# Patient Record
Sex: Female | Born: 1970 | Race: White | Hispanic: No | Marital: Married | Smoking: Never smoker
Health system: Southern US, Community
[De-identification: ages and names within clinical notes are randomized; demographics above are authoritative.]

## PROBLEM LIST (undated history)

## (undated) DIAGNOSIS — I10 Essential (primary) hypertension: Secondary | ICD-10-CM

## (undated) DIAGNOSIS — D649 Anemia, unspecified: Secondary | ICD-10-CM

## (undated) DIAGNOSIS — C801 Malignant (primary) neoplasm, unspecified: Secondary | ICD-10-CM

## (undated) DIAGNOSIS — R51 Headache: Secondary | ICD-10-CM

## (undated) DIAGNOSIS — Z9049 Acquired absence of other specified parts of digestive tract: Secondary | ICD-10-CM

## (undated) DIAGNOSIS — R0600 Dyspnea, unspecified: Secondary | ICD-10-CM

## (undated) DIAGNOSIS — G4730 Sleep apnea, unspecified: Secondary | ICD-10-CM

## (undated) DIAGNOSIS — G2581 Restless legs syndrome: Secondary | ICD-10-CM

## (undated) DIAGNOSIS — I509 Heart failure, unspecified: Secondary | ICD-10-CM

## (undated) DIAGNOSIS — M797 Fibromyalgia: Secondary | ICD-10-CM

## (undated) DIAGNOSIS — L309 Dermatitis, unspecified: Secondary | ICD-10-CM

## (undated) DIAGNOSIS — F329 Major depressive disorder, single episode, unspecified: Secondary | ICD-10-CM

## (undated) DIAGNOSIS — K8050 Calculus of bile duct without cholangitis or cholecystitis without obstruction: Secondary | ICD-10-CM

## (undated) DIAGNOSIS — K649 Unspecified hemorrhoids: Secondary | ICD-10-CM

## (undated) DIAGNOSIS — Z923 Personal history of irradiation: Secondary | ICD-10-CM

## (undated) DIAGNOSIS — F32A Depression, unspecified: Secondary | ICD-10-CM

## (undated) HISTORY — DX: Dermatitis, unspecified: L30.9

## (undated) HISTORY — DX: Major depressive disorder, single episode, unspecified: F32.9

## (undated) HISTORY — DX: Acquired absence of other specified parts of digestive tract: Z90.49

## (undated) HISTORY — DX: Calculus of bile duct without cholangitis or cholecystitis without obstruction: K80.50

## (undated) HISTORY — DX: Restless legs syndrome: G25.81

## (undated) HISTORY — PX: WISDOM TOOTH EXTRACTION: SHX21

## (undated) HISTORY — DX: Depression, unspecified: F32.A

## (undated) HISTORY — DX: Essential (primary) hypertension: I10

## (undated) HISTORY — DX: Dyspnea, unspecified: R06.00

## (undated) HISTORY — DX: Fibromyalgia: M79.7

---

## 1989-06-12 HISTORY — PX: DILATION AND CURETTAGE OF UTERUS: SHX78

## 1999-05-04 ENCOUNTER — Other Ambulatory Visit: Admission: RE | Admit: 1999-05-04 | Discharge: 1999-05-04 | Payer: Self-pay | Admitting: *Deleted

## 2000-05-09 ENCOUNTER — Other Ambulatory Visit: Admission: RE | Admit: 2000-05-09 | Discharge: 2000-05-09 | Payer: Self-pay | Admitting: *Deleted

## 2001-06-03 ENCOUNTER — Other Ambulatory Visit: Admission: RE | Admit: 2001-06-03 | Discharge: 2001-06-03 | Payer: Self-pay | Admitting: *Deleted

## 2002-06-10 ENCOUNTER — Other Ambulatory Visit: Admission: RE | Admit: 2002-06-10 | Discharge: 2002-06-10 | Payer: Self-pay | Admitting: *Deleted

## 2003-06-11 ENCOUNTER — Other Ambulatory Visit: Admission: RE | Admit: 2003-06-11 | Discharge: 2003-06-11 | Payer: Self-pay | Admitting: Obstetrics and Gynecology

## 2009-03-15 ENCOUNTER — Ambulatory Visit (HOSPITAL_COMMUNITY): Admission: RE | Admit: 2009-03-15 | Discharge: 2009-03-15 | Payer: Self-pay | Admitting: Family Medicine

## 2010-09-14 ENCOUNTER — Other Ambulatory Visit (HOSPITAL_COMMUNITY): Payer: Self-pay | Admitting: Family Medicine

## 2010-09-14 ENCOUNTER — Encounter (HOSPITAL_COMMUNITY): Payer: Self-pay

## 2010-09-14 ENCOUNTER — Ambulatory Visit (HOSPITAL_COMMUNITY)
Admission: RE | Admit: 2010-09-14 | Discharge: 2010-09-14 | Disposition: A | Discharge status: Home / Self Care | Payer: BC Managed Care – PPO | Source: Ambulatory Visit | Attending: Family Medicine | Admitting: Family Medicine

## 2010-09-14 DIAGNOSIS — R222 Localized swelling, mass and lump, trunk: Secondary | ICD-10-CM | POA: Insufficient documentation

## 2010-09-14 DIAGNOSIS — IMO0001 Reserved for inherently not codable concepts without codable children: Secondary | ICD-10-CM

## 2010-09-14 DIAGNOSIS — R0602 Shortness of breath: Secondary | ICD-10-CM | POA: Insufficient documentation

## 2010-09-14 DIAGNOSIS — R0789 Other chest pain: Secondary | ICD-10-CM | POA: Insufficient documentation

## 2012-02-22 ENCOUNTER — Other Ambulatory Visit (HOSPITAL_COMMUNITY): Payer: Self-pay | Admitting: Internal Medicine

## 2012-02-22 DIAGNOSIS — IMO0002 Reserved for concepts with insufficient information to code with codable children: Secondary | ICD-10-CM

## 2012-02-26 ENCOUNTER — Other Ambulatory Visit (HOSPITAL_COMMUNITY): Payer: Self-pay | Admitting: Internal Medicine

## 2012-02-26 ENCOUNTER — Ambulatory Visit (HOSPITAL_COMMUNITY)
Admission: RE | Admit: 2012-02-26 | Discharge: 2012-02-26 | Disposition: A | Discharge status: Home / Self Care | Payer: BC Managed Care – PPO | Source: Ambulatory Visit | Attending: Internal Medicine | Admitting: Internal Medicine

## 2012-02-26 DIAGNOSIS — R918 Other nonspecific abnormal finding of lung field: Secondary | ICD-10-CM | POA: Insufficient documentation

## 2012-02-26 DIAGNOSIS — IMO0002 Reserved for concepts with insufficient information to code with codable children: Secondary | ICD-10-CM

## 2012-02-26 DIAGNOSIS — J9859 Other diseases of mediastinum, not elsewhere classified: Secondary | ICD-10-CM | POA: Insufficient documentation

## 2012-02-26 DIAGNOSIS — R222 Localized swelling, mass and lump, trunk: Secondary | ICD-10-CM | POA: Insufficient documentation

## 2012-02-26 MED ORDER — IOHEXOL 300 MG/ML  SOLN
80.0000 mL | Freq: Once | INTRAMUSCULAR | Status: AC | PRN
  Administered 2012-02-26: 80 mL via INTRAVENOUS

## 2012-02-29 ENCOUNTER — Encounter: Payer: Self-pay | Admitting: *Deleted

## 2012-02-29 DIAGNOSIS — M797 Fibromyalgia: Secondary | ICD-10-CM

## 2012-02-29 DIAGNOSIS — R0600 Dyspnea, unspecified: Secondary | ICD-10-CM | POA: Insufficient documentation

## 2012-02-29 DIAGNOSIS — F329 Major depressive disorder, single episode, unspecified: Secondary | ICD-10-CM | POA: Insufficient documentation

## 2012-02-29 DIAGNOSIS — G2581 Restless legs syndrome: Secondary | ICD-10-CM | POA: Insufficient documentation

## 2012-02-29 DIAGNOSIS — F32A Depression, unspecified: Secondary | ICD-10-CM | POA: Insufficient documentation

## 2012-02-29 DIAGNOSIS — J9859 Other diseases of mediastinum, not elsewhere classified: Secondary | ICD-10-CM

## 2012-02-29 DIAGNOSIS — L309 Dermatitis, unspecified: Secondary | ICD-10-CM | POA: Insufficient documentation

## 2012-02-29 DIAGNOSIS — I10 Essential (primary) hypertension: Secondary | ICD-10-CM | POA: Insufficient documentation

## 2012-03-01 ENCOUNTER — Other Ambulatory Visit: Payer: Self-pay | Admitting: Thoracic Surgery (Cardiothoracic Vascular Surgery)

## 2012-03-01 ENCOUNTER — Encounter: Payer: Self-pay | Admitting: Thoracic Surgery (Cardiothoracic Vascular Surgery)

## 2012-03-01 ENCOUNTER — Other Ambulatory Visit: Payer: Self-pay | Admitting: *Deleted

## 2012-03-01 ENCOUNTER — Institutional Professional Consult (permissible substitution) (INDEPENDENT_AMBULATORY_CARE_PROVIDER_SITE_OTHER): Payer: BC Managed Care – PPO | Admitting: Thoracic Surgery (Cardiothoracic Vascular Surgery)

## 2012-03-01 VITALS — BP 143/90 | HR 99 | Resp 16 | Ht 66.0 in | Wt 195.0 lb

## 2012-03-01 DIAGNOSIS — J9859 Other diseases of mediastinum, not elsewhere classified: Secondary | ICD-10-CM

## 2012-03-01 DIAGNOSIS — D381 Neoplasm of uncertain behavior of trachea, bronchus and lung: Secondary | ICD-10-CM

## 2012-03-01 DIAGNOSIS — R222 Localized swelling, mass and lump, trunk: Secondary | ICD-10-CM

## 2012-03-01 LAB — HCG, QUANTITATIVE, PREGNANCY: hCG, Beta Chain, Quant, S: <2 m[IU]/mL

## 2012-03-01 LAB — AFP TUMOR MARKER: AFP-Tumor Marker: 4.3 ng/mL (ref 0.0–8.0)

## 2012-03-01 NOTE — Progress Notes (Signed)
PCP is HALL,ZACK, MD Referring Provider is Hall, Zack, MD  Chief Complaint  Patient presents with  . Mediastinal Mass    anterior...eval and treat    HPI: [name] [name] is a 41-year-old woman who presents with a chief complaint of chest discomfort and shortness of breath with exertion.  She says her symptoms date back about 2-1/2-3 years. She previously was very active. She began having episodes when she would exert herself that caused her to feel like her heart was "beating out of her chest" she. She also would have a feeling that she couldn't get a full inspiration and that he felt like "there was air at the top of the lungs but none at the bottom". This has progressively gotten worse over time. She had been seen on several occasions, but no underlying cause was discerned. She has had to stop many activities that she formally engaged in such as hiking and tennis. She's not had any fevers or chills but does have occasional night sweats.  She recently saw Dr. Hall. In reviewing her records she found a chest x-ray from April of 2012 which showed an abnormal mediastinal contour. He ordered a CT of the chest which showed a large anterior mediastinal mass surrounding and encasing the great vessels.  Past Medical History  Diagnosis Date  . Fibromyalgia   . Eczema   . Hypertension   . Dyspnea   . Depression   . Restless leg     No past surgical history on file.  No family history on file.  Social History History  Substance Use Topics  . Smoking status: Never Smoker   . Smokeless tobacco: Never Used  . Alcohol Use: No    Current Outpatient Prescriptions  Medication Sig Dispense Refill  . desvenlafaxine (PRISTIQ) 50 MG 24 hr tablet Take 50 mg by mouth daily.      . gabapentin (NEURONTIN) 600 MG tablet Take 300 mg by mouth at bedtime. One or two hs      . lisinopril-hydrochlorothiazide (PRINZIDE,ZESTORETIC) 20-12.5 MG per tablet Take 1 tablet by mouth daily.      . Multiple  Minerals-Vitamins (CALCIUM & VIT D3 BONE HEALTH PO) Take by mouth.      . norethindrone-ethinyl estradiol (JUNEL FE,GILDESS FE,LOESTRIN FE) 1-20 MG-MCG tablet Take 1 tablet by mouth daily.      . tiZANidine (ZANAFLEX) 4 MG capsule Take 4 mg by mouth at bedtime.      . vitamin B-12 (CYANOCOBALAMIN) 1000 MCG tablet Take 1,000 mcg by mouth daily.      . vitamin C (ASCORBIC ACID) 500 MG tablet Take 500 mg by mouth daily.        Allergies  Allergen Reactions  . Sulfur Anaphylaxis    Review of Systems  Constitutional: Positive for activity change and fatigue. Negative for fever, chills and unexpected weight change.  Respiratory: Positive for chest tightness and shortness of breath.   Cardiovascular: Positive for chest pain.  Genitourinary: Negative.   Musculoskeletal:       Muscle pain, restless leg syndrome  Neurological: Negative for dizziness, seizures and light-headedness.  Hematological: Does not bruise/bleed easily.  Psychiatric/Behavioral: Negative.   All other systems reviewed and are negative.    BP 143/90  Pulse 99  Resp 16  Ht 5' 6" (1.676 m)  Wt 195 lb (88.451 kg)  BMI 31.47 kg/m2  SpO2 96% Physical Exam  Vitals reviewed. Constitutional: She is oriented to person, place, and time. She appears well-developed and well-nourished. No distress.    HENT:  Head: Normocephalic and atraumatic.  Eyes: EOM are normal. Pupils are equal, round, and reactive to light.  Neck: Neck supple. No thyromegaly present.  Cardiovascular: Normal rate and regular rhythm.   Murmur (2/6 systolic) heard. Pulmonary/Chest: Effort normal and breath sounds normal. She has no wheezes. She has no rales.  Abdominal: Soft. There is no tenderness.  Musculoskeletal: She exhibits no edema.  Lymphadenopathy:    She has cervical adenopathy (questionable lower right cervical node).  Neurological: She is alert and oriented to person, place, and time. No cranial nerve deficit.  Skin: Skin is warm and dry.      Diagnostic Tests: CT Chest 02/26/12 Technique: Multidetector CT imaging of the chest was performed  following the standard protocol during bolus administration of  intravenous contrast.  Contrast: 80mL OMNIPAQUE IOHEXOL 300 MG/ML SOLN  Comparison: None.  Findings:  There is no enlarged supraclavicular or axillary adenopathy. Large  anterior mediastinal mass is identified measuring 8.30 x 12.1 x 6.7  cm. This mass is encasing the great vessels. There is significant  narrowing of the left subclavian vein. There is also encasement and  narrowing of the left common carotid artery. There is no hilar  adenopathy. No pericardial or pleural effusion.  Lungs are clear. No pulmonary nodule or pulmonary mass identified.  Limited imaging through the upper abdomen is unremarkable.  IMPRESSION:  1. Large anterior mediastinal mass is identified and is worrisome  for tumor. This is encasing the great vessels and S causing  significant narrowing of the left subclavian vein. Recommend  further evaluation with PET CT and tissue sampling.   Impression: 41-year-old woman with newly discovered anterior mediastinal mass. I went to great detail with patient and her husband regarding the CT findings and reviewed the films with them. We talked about the differential diagnosis. They understand that different diagnosis includes lymphoma, thymic tumors, germ cell tumors and thyroid. They understand this lesion is more likely to be malignant than benign. Given the appearance of her lesion I think lymphoma is the most likely diagnosis. Malignant germ cell tumors are much less common in women, but not unheard of..  We need to establish a definitive diagnosis. I will order germ cell tumor markers. We will go ahead and schedule her for a left anterior mediastinotomy (Chamberlain procedure) pending the results of the blood tests. She will also need a PET/CT but I do not want to delay the biopsy.  I discussed the  indications, risks, benefits, and alternatives for a left anterior mediastinotomy. They understand this is far more likely to yield a definitive diagnosis than attempted needle aspiration. They understand the general nature of the procedure, the incision to be used, and the plan for general anesthesia. They also understand that this is a diagnostic and not a therapeutic procedure. They understand the risks include but are not limited to those associated with general anesthesia, bleeding, infection, as well as the extremely unlikely possibilities of MI, DVT, PE. She accepts these risks and wishes to proceed.   Plan: 1. Alpha-fetoprotein and beta hCG levels 2. Schedule PET/CT 3. Left anterior mediastinotomy under general anesthesia on Tuesday, September 24. We will plan to do this as an outpatient procedure. 

## 2012-03-04 ENCOUNTER — Encounter (HOSPITAL_COMMUNITY): Payer: Self-pay | Admitting: Pharmacy Technician

## 2012-03-04 ENCOUNTER — Encounter (HOSPITAL_COMMUNITY)
Admission: RE | Admit: 2012-03-04 | Discharge: 2012-03-04 | Disposition: A | Discharge status: Home / Self Care | Payer: BC Managed Care – PPO | Source: Ambulatory Visit | Attending: Thoracic Surgery (Cardiothoracic Vascular Surgery) | Admitting: Thoracic Surgery (Cardiothoracic Vascular Surgery)

## 2012-03-04 ENCOUNTER — Encounter (HOSPITAL_COMMUNITY): Payer: Self-pay

## 2012-03-04 VITALS — BP 162/97 | HR 109 | Temp 98.8°F | Resp 20 | Ht 66.0 in | Wt 199.3 lb

## 2012-03-04 DIAGNOSIS — J9859 Other diseases of mediastinum, not elsewhere classified: Secondary | ICD-10-CM

## 2012-03-04 HISTORY — DX: Unspecified hemorrhoids: K64.9

## 2012-03-04 HISTORY — DX: Malignant (primary) neoplasm, unspecified: C80.1

## 2012-03-04 HISTORY — DX: Sleep apnea, unspecified: G47.30

## 2012-03-04 HISTORY — DX: Headache: R51

## 2012-03-04 HISTORY — DX: Anemia, unspecified: D64.9

## 2012-03-04 LAB — COMPREHENSIVE METABOLIC PANEL
ALT: 31 U/L (ref 0–35)
AST: 23 U/L (ref 0–37)
Albumin: 3.7 g/dL (ref 3.5–5.2)
Alkaline Phosphatase: 97 U/L (ref 39–117)
BUN: 16 mg/dL (ref 6–23)
CO2: 22 mEq/L (ref 19–32)
Calcium: 9.7 mg/dL (ref 8.4–10.5)
Chloride: 102 mEq/L (ref 96–112)
Creatinine, Ser: 0.77 mg/dL (ref 0.50–1.10)
GFR calc Af Amer: >90 mL/min (ref >90)
GFR calc non Af Amer: >90 mL/min (ref >90)
Glucose, Bld: 130 mg/dL — ABNORMAL HIGH (ref 70–99)
Potassium: 3.5 mEq/L (ref 3.5–5.1)
Sodium: 138 mEq/L (ref 135–145)
Total Bilirubin: 0.2 mg/dL — ABNORMAL LOW (ref 0.3–1.2)
Total Protein: 7.4 g/dL (ref 6.0–8.3)

## 2012-03-04 LAB — CBC
HCT: 38 % (ref 36.0–46.0)
Hemoglobin: 13.4 g/dL (ref 12.0–15.0)
MCH: 30.7 pg (ref 26.0–34.0)
MCHC: 35.3 g/dL (ref 30.0–36.0)
MCV: 87.2 fL (ref 78.0–100.0)
Platelets: 321 10*3/uL (ref 150–400)
RBC: 4.36 MIL/uL (ref 3.87–5.11)
RDW: 12.3 % (ref 11.5–15.5)
WBC: 8.7 10*3/uL (ref 4.0–10.5)

## 2012-03-04 LAB — URINALYSIS, ROUTINE W REFLEX MICROSCOPIC
Bilirubin Urine: NEGATIVE
Glucose, UA: NEGATIVE mg/dL
Ketones, ur: NEGATIVE mg/dL
Nitrite: NEGATIVE
Protein, ur: NEGATIVE mg/dL
Specific Gravity, Urine: 1.027 (ref 1.005–1.030)
Urobilinogen, UA: 1 mg/dL (ref 0.0–1.0)
pH: 6 (ref 5.0–8.0)

## 2012-03-04 LAB — BLOOD GAS, ARTERIAL
Acid-base deficit: 0.8 mmol/L (ref 0.0–2.0)
Bicarbonate: 22.7 mEq/L (ref 20.0–24.0)
Drawn by: 206361
FIO2: 0.21 %
O2 Saturation: 98.1 %
Patient temperature: 98.6
TCO2: 23.7 mmol/L (ref 0–100)
pCO2 arterial: 33.4 mmHg — ABNORMAL LOW (ref 35.0–45.0)
pH, Arterial: 7.446 (ref 7.350–7.450)
pO2, Arterial: 101 mmHg — ABNORMAL HIGH (ref 80.0–100.0)

## 2012-03-04 LAB — PROTIME-INR
INR: 1.08 (ref 0.00–1.49)
Prothrombin Time: 13.9 seconds (ref 11.6–15.2)

## 2012-03-04 LAB — URINE MICROSCOPIC-ADD ON

## 2012-03-04 LAB — APTT: aPTT: 29 seconds (ref 24–37)

## 2012-03-04 LAB — SURGICAL PCR SCREEN
MRSA, PCR: NEGATIVE
Staphylococcus aureus: NEGATIVE

## 2012-03-04 LAB — ABO/RH: ABO/RH(D): A POS

## 2012-03-04 MED ORDER — DEXTROSE 5 % IV SOLN
1.5000 g | INTRAVENOUS | Status: AC
  Administered 2012-03-05: 1.5 g via INTRAVENOUS
  Filled 2012-03-04: qty 1.5

## 2012-03-04 NOTE — Progress Notes (Signed)
I faxed a request to [facility] for an EKG.

## 2012-03-04 NOTE — Pre-Procedure Instructions (Signed)
20 IFUNANYA FIERO  03/04/2012   Your procedure is scheduled on:  Tuesday, September 24th.  Report to [facility] at 5:30AM.   Call this number if you have problems the morning of surgery: [phone]   Remember:   Do not eat food or drink any liquid:After Midnight.     Take these medicines the morning of surgery with A SIP OF WATER: Desvenlafaxine (Pristiq) and Hormones.   Stop taking Ibuprofen (Advil).   Do not wear jewelry, make-up or nail polish.  Do not wear lotions, powders, or perfumes. You may wear deodorant.  Do not shave 48 hours prior to surgery. Men may shave face and neck.  Do not bring valuables to the hospital.  Contacts, dentures or bridgework may not be worn into surgery.  Leave suitcase in the car. After surgery it may be brought to your room.  For patients admitted to the hospital, checkout time is 11:00 AM the day of discharge.   Patients discharged the day of surgery will not be allowed to drive home.  Name and phone number of your driver: NA  Special Instructions: Shower using CHG 2 nights before surgery and the night before surgery.  If you shower the day of surgery use CHG.  Use special wash - you have one bottle of CHG for all showers.  You should use approximately 1/3 of the bottle for each shower.   Please read over the following fact sheets that you were given: Pain Booklet, Coughing and Deep Breathing, Blood Transfusion Information and MRSA Information.

## 2012-03-05 ENCOUNTER — Ambulatory Visit (HOSPITAL_COMMUNITY): Payer: BC Managed Care – PPO

## 2012-03-05 ENCOUNTER — Ambulatory Visit (HOSPITAL_COMMUNITY)
Admission: RE | Admit: 2012-03-05 | Discharge: 2012-03-05 | Disposition: A | Discharge status: Home / Self Care | Payer: BC Managed Care – PPO | Source: Ambulatory Visit | Attending: Thoracic Surgery (Cardiothoracic Vascular Surgery) | Admitting: Thoracic Surgery (Cardiothoracic Vascular Surgery)

## 2012-03-05 ENCOUNTER — Encounter (HOSPITAL_COMMUNITY): Payer: Self-pay | Admitting: Anesthesiology

## 2012-03-05 ENCOUNTER — Encounter (HOSPITAL_COMMUNITY)
Admission: RE | Disposition: A | Discharge status: Home / Self Care | Payer: Self-pay | Source: Ambulatory Visit | Attending: Thoracic Surgery (Cardiothoracic Vascular Surgery)

## 2012-03-05 ENCOUNTER — Ambulatory Visit (HOSPITAL_COMMUNITY): Payer: BC Managed Care – PPO | Admitting: Anesthesiology

## 2012-03-05 ENCOUNTER — Encounter (HOSPITAL_COMMUNITY): Payer: Self-pay | Admitting: Surgery

## 2012-03-05 DIAGNOSIS — C8192 Hodgkin lymphoma, unspecified, intrathoracic lymph nodes: Secondary | ICD-10-CM | POA: Insufficient documentation

## 2012-03-05 DIAGNOSIS — Z01812 Encounter for preprocedural laboratory examination: Secondary | ICD-10-CM | POA: Insufficient documentation

## 2012-03-05 DIAGNOSIS — R222 Localized swelling, mass and lump, trunk: Secondary | ICD-10-CM

## 2012-03-05 DIAGNOSIS — J9859 Other diseases of mediastinum, not elsewhere classified: Secondary | ICD-10-CM

## 2012-03-05 DIAGNOSIS — Z01810 Encounter for preprocedural cardiovascular examination: Secondary | ICD-10-CM | POA: Insufficient documentation

## 2012-03-05 DIAGNOSIS — I10 Essential (primary) hypertension: Secondary | ICD-10-CM | POA: Insufficient documentation

## 2012-03-05 DIAGNOSIS — Z01818 Encounter for other preprocedural examination: Secondary | ICD-10-CM | POA: Insufficient documentation

## 2012-03-05 HISTORY — PX: OTHER SURGICAL HISTORY: SHX169

## 2012-03-05 SURGERY — MEDIASTINOTOMY, CHAMBERLAIN
Anesthesia: General | Site: Chest | Laterality: Left | Wound class: Clean

## 2012-03-05 MED ORDER — PROPOFOL 10 MG/ML IV BOLUS
INTRAVENOUS | Status: DC | PRN
  Administered 2012-03-05: 140 mg via INTRAVENOUS

## 2012-03-05 MED ORDER — GLYCOPYRROLATE 0.2 MG/ML IJ SOLN
INTRAMUSCULAR | Status: DC | PRN
  Administered 2012-03-05: .8 mg via INTRAVENOUS

## 2012-03-05 MED ORDER — FENTANYL CITRATE 0.05 MG/ML IJ SOLN
INTRAMUSCULAR | Status: DC | PRN
  Administered 2012-03-05: 25 ug via INTRAVENOUS
  Administered 2012-03-05: 175 ug via INTRAVENOUS
  Administered 2012-03-05 (×2): 25 ug via INTRAVENOUS
  Administered 2012-03-05: 50 ug via INTRAVENOUS
  Administered 2012-03-05 (×2): 25 ug via INTRAVENOUS

## 2012-03-05 MED ORDER — OXYCODONE-ACETAMINOPHEN 5-325 MG PO TABS: 1.0000 | ORAL_TABLET | ORAL | Status: DC | PRN

## 2012-03-05 MED ORDER — MIDAZOLAM HCL 5 MG/5ML IJ SOLN
INTRAMUSCULAR | Status: DC | PRN
  Administered 2012-03-05: 2 mg via INTRAVENOUS

## 2012-03-05 MED ORDER — DEXAMETHASONE SODIUM PHOSPHATE 4 MG/ML IJ SOLN
INTRAMUSCULAR | Status: DC | PRN
  Administered 2012-03-05: 4 mg via INTRAVENOUS

## 2012-03-05 MED ORDER — NEOSTIGMINE METHYLSULFATE 1 MG/ML IJ SOLN
INTRAMUSCULAR | Status: DC | PRN
  Administered 2012-03-05: 5 mg via INTRAVENOUS

## 2012-03-05 MED ORDER — BUPIVACAINE-EPINEPHRINE (PF) 0.5% -1:200000 IJ SOLN
INTRAMUSCULAR | Status: AC
  Filled 2012-03-05: qty 10

## 2012-03-05 MED ORDER — ONDANSETRON HCL 4 MG/2ML IJ SOLN
INTRAMUSCULAR | Status: DC | PRN
  Administered 2012-03-05: 4 mg via INTRAVENOUS

## 2012-03-05 MED ORDER — LIDOCAINE HCL (CARDIAC) 20 MG/ML IV SOLN
INTRAVENOUS | Status: DC | PRN
  Administered 2012-03-05: 70 mg via INTRAVENOUS

## 2012-03-05 MED ORDER — ONDANSETRON HCL 4 MG/2ML IJ SOLN: 4.0000 mg | Freq: Once | INTRAMUSCULAR | Status: DC | PRN

## 2012-03-05 MED ORDER — HYDROMORPHONE HCL PF 1 MG/ML IJ SOLN
0.2500 mg | INTRAMUSCULAR | Status: DC | PRN
  Administered 2012-03-05 (×2): 0.5 mg via INTRAVENOUS

## 2012-03-05 MED ORDER — HYDROMORPHONE HCL PF 1 MG/ML IJ SOLN
INTRAMUSCULAR | Status: AC
  Filled 2012-03-05: qty 1

## 2012-03-05 MED ORDER — ACETAMINOPHEN 10 MG/ML IV SOLN: 1000.0000 mg | Freq: Once | INTRAVENOUS | Status: DC | PRN

## 2012-03-05 MED ORDER — 0.9 % SODIUM CHLORIDE (POUR BTL) OPTIME
TOPICAL | Status: DC | PRN
  Administered 2012-03-05: 1000 mL

## 2012-03-05 MED ORDER — BUPIVACAINE-EPINEPHRINE PF 0.5-1:200000 % IJ SOLN
INTRAMUSCULAR | Status: DC | PRN
  Administered 2012-03-05: 18 mL

## 2012-03-05 MED ORDER — ROCURONIUM BROMIDE 100 MG/10ML IV SOLN
INTRAVENOUS | Status: DC | PRN
  Administered 2012-03-05: 50 mg via INTRAVENOUS

## 2012-03-05 MED ORDER — LACTATED RINGERS IV SOLN
INTRAVENOUS | Status: DC | PRN
  Administered 2012-03-05 (×2): via INTRAVENOUS

## 2012-03-05 SURGICAL SUPPLY — 51 items
ADH SKN CLS LQ APL DERMABOND (GAUZE/BANDAGES/DRESSINGS) ×1
APPLIER CLIP LOGIC TI 5 (MISCELLANEOUS) IMPLANT
APR CLP MED LRG 33X5 (MISCELLANEOUS)
BLADE SURG 15 STRL LF DISP TIS (BLADE) ×1 IMPLANT
BLADE SURG 15 STRL SS (BLADE)
CANISTER SUCTION 2500CC (MISCELLANEOUS) ×2 IMPLANT
CLIP TI MEDIUM 6 (CLIP) ×2 IMPLANT
CLOTH BEACON ORANGE TIMEOUT ST (SAFETY) ×2 IMPLANT
CONT SPEC 4OZ CLIKSEAL STRL BL (MISCELLANEOUS) ×4 IMPLANT
COVER SURGICAL LIGHT HANDLE (MISCELLANEOUS) ×3 IMPLANT
DERMABOND ADHESIVE PROPEN (GAUZE/BANDAGES/DRESSINGS) ×1
DERMABOND ADVANCED .7 DNX6 (GAUZE/BANDAGES/DRESSINGS) IMPLANT
DRAPE LAPAROTOMY T 102X78X121 (DRAPES) ×2 IMPLANT
ELECT REM PT RETURN 9FT ADLT (ELECTROSURGICAL) ×2
ELECTRODE REM PT RTRN 9FT ADLT (ELECTROSURGICAL) ×1 IMPLANT
GAUZE SPONGE 4X4 16PLY XRAY LF (GAUZE/BANDAGES/DRESSINGS) ×2 IMPLANT
GLOVE EUDERMIC 7 POWDERFREE (GLOVE) ×2 IMPLANT
GOWN PREVENTION PLUS XLARGE (GOWN DISPOSABLE) ×2 IMPLANT
GOWN STRL NON-REIN LRG LVL3 (GOWN DISPOSABLE) ×2 IMPLANT
GOWN STRL REIN XL XLG (GOWN DISPOSABLE) ×1 IMPLANT
HEMOSTAT SURGICEL 2X14 (HEMOSTASIS) ×1 IMPLANT
KIT BASIN OR (CUSTOM PROCEDURE TRAY) ×2 IMPLANT
KIT ROOM TURNOVER OR (KITS) ×2 IMPLANT
NEEDLE HYPO 22GX1.5 SAFETY (NEEDLE) ×1 IMPLANT
NS IRRIG 1000ML POUR BTL (IV SOLUTION) ×2 IMPLANT
PACK GENERAL/GYN (CUSTOM PROCEDURE TRAY) ×1 IMPLANT
PACK SURGICAL SETUP 50X90 (CUSTOM PROCEDURE TRAY) ×1 IMPLANT
PAD ARMBOARD 7.5X6 YLW CONV (MISCELLANEOUS) ×4 IMPLANT
PENCIL BUTTON HOLSTER BLD 10FT (ELECTRODE) ×1 IMPLANT
SPONGE GAUZE 4X4 12PLY (GAUZE/BANDAGES/DRESSINGS) ×2 IMPLANT
SPONGE INTESTINAL PEANUT (DISPOSABLE) ×1 IMPLANT
SUT PROLENE 4 0 RB 1 (SUTURE) ×4
SUT PROLENE 4-0 RB1 .5 CRCL 36 (SUTURE) IMPLANT
SUT SILK 2 0 TIES 10X30 (SUTURE) IMPLANT
SUT SILK 2 0SH CR/8 30 (SUTURE) ×1 IMPLANT
SUT SILK 3 0 SH CR/8 (SUTURE) ×1 IMPLANT
SUT VIC AB 2-0 CT1 27 (SUTURE) ×2
SUT VIC AB 2-0 CT1 TAPERPNT 27 (SUTURE) ×1 IMPLANT
SUT VIC AB 3-0 SH 18 (SUTURE) IMPLANT
SUT VIC AB 3-0 SH 27 (SUTURE) ×2
SUT VIC AB 3-0 SH 27X BRD (SUTURE) ×1 IMPLANT
SUT VICRYL 4-0 PS2 18IN ABS (SUTURE) IMPLANT
SWAB COLLECTION DEVICE MRSA (MISCELLANEOUS) IMPLANT
SYR CONTROL 10ML LL (SYRINGE) ×1 IMPLANT
SYRINGE 10CC LL (SYRINGE) ×2 IMPLANT
TOWEL OR 17X24 6PK STRL BLUE (TOWEL DISPOSABLE) ×2 IMPLANT
TOWEL OR 17X26 10 PK STRL BLUE (TOWEL DISPOSABLE) ×2 IMPLANT
TRAY FOLEY IC TEMP SENS 14FR (CATHETERS) ×2 IMPLANT
TUBE ANAEROBIC SPECIMEN COL (MISCELLANEOUS) IMPLANT
TUBE CONNECTING 12X1/4 (SUCTIONS) ×1 IMPLANT
WATER STERILE IRR 1000ML POUR (IV SOLUTION) ×2 IMPLANT

## 2012-03-05 NOTE — Brief Op Note (Addendum)
03/05/2012  9:13 AM  PATIENT:  [name] [name]  41 y.o. female  PRE-OPERATIVE DIAGNOSIS:  ANTERIOR MEDIASTINAL MASS  POST-OPERATIVE DIAGNOSIS:  ANTERIOR MEDIASTINAL MASS  PROCEDURE: Left Anterior MEDIASTINOTOMY (CHAMBERLAIN)   SURGEON:  Surgeon(s) and Role:    * Loreli Slot, MD - Primary  PHYSICIAN ASSISTANT: Doree Fudge PA-C   ANESTHESIA:   general  EBL:  Total I/O In: 1000 [I.V.:1000] Out: 100 [Urine:50; Blood:50]  BLOOD ADMINISTERED:none  DRAINS: none   LOCAL MEDICATIONS USED:  MARCAINE   0.5%  SPECIMEN:  Source of Specimen:  Anterior mediastinal mass  DISPOSITION OF SPECIMEN:  PATHOLOGY  COUNTS CORRECT:  YES  PLAN OF CARE: Discharge to home after PACU  PATIENT DISPOSITION:  PACU - hemodynamically stable.   Delay start of Pharmacological VTE agent (>24hrs) due to surgical blood loss or risk of bleeding: yes  Frozen- lymphoid cells and fibrosis. Not able to make diagnosis based on frozen  Dictation # [id number]

## 2012-03-05 NOTE — Preoperative (Signed)
Beta Blockers   Reason not to administer Beta Blockers:Not Applicable 

## 2012-03-05 NOTE — Anesthesia Procedure Notes (Signed)
Procedure Name: Intubation Date/Time: 03/05/2012 7:44 AM Performed by: Elon Alas Pre-anesthesia Checklist: Patient identified, Timeout performed, Emergency Drugs available, Suction available and Patient being monitored Patient Re-evaluated:Patient Re-evaluated prior to inductionOxygen Delivery Method: Circle system utilized Preoxygenation: Pre-oxygenation with 100% oxygen Intubation Type: IV induction Ventilation: Mask ventilation without difficulty and Oral airway inserted - appropriate to patient size Laryngoscope Size: Mac and 3 Grade View: Grade I Tube type: Oral Tube size: 7.5 mm Number of attempts: 1 Airway Equipment and Method: Stylet Placement Confirmation: positive ETCO2,  ETT inserted through vocal cords under direct vision and breath sounds checked- equal and bilateral Secured at: 21 cm Tube secured with: Tape Dental Injury: Teeth and Oropharynx as per pre-operative assessment

## 2012-03-05 NOTE — Anesthesia Preprocedure Evaluation (Addendum)
Anesthesia Evaluation  Patient identified by MRN, date of birth, ID band Patient awake    Reviewed: Allergy & Precautions, H&P , NPO status , Patient's Chart, lab work & pertinent test results  Airway Mallampati: II      Dental  (+) Dental Advisory Given and Teeth Intact   Pulmonary shortness of breath and with exertion,  Anterior mediastinal mass breath sounds clear to auscultation        Cardiovascular hypertension, Pt. on medications Rhythm:Regular Rate:Normal     Neuro/Psych  Headaches, PSYCHIATRIC DISORDERS Depression    GI/Hepatic   Endo/Other    Renal/GU      Musculoskeletal   Abdominal (+)  Abdomen: soft.    Peds  Hematology   Anesthesia Other Findings   Reproductive/Obstetrics                          Anesthesia Physical Anesthesia Plan  ASA: III  Anesthesia Plan: General   Post-op Pain Management:    Induction: Intravenous  Airway Management Planned: Oral ETT  Additional Equipment: Arterial line  Intra-op Plan:   Post-operative Plan: Extubation in OR  Informed Consent: I have reviewed the patients History and Physical, chart, labs and discussed the procedure including the risks, benefits and alternatives for the proposed anesthesia with the patient or authorized representative who has indicated his/her understanding and acceptance.   Dental advisory given  Plan Discussed with: Anesthesiologist, Surgeon and CRNA  Anesthesia Plan Comments: (Mediastinal mass  Htn Fibromyalgia  Anxiety  Plan GA with art line  Kipp Brood, MD)       Anesthesia Quick Evaluation

## 2012-03-05 NOTE — Transfer of Care (Signed)
Immediate Anesthesia Transfer of Care Note  Patient: [name] [name]  Procedure(s) Performed: Procedure(s) (LRB) with comments: MEDIASTINOTOMY CHAMBERLAIN MCNEIL (Left) - Left  ANTERIOR MEDIASTINOTOMY(CHAMBERLAIN)  Patient Location: PACU  Anesthesia Type: General  Level of Consciousness: awake, alert  and oriented  Airway & Oxygen Therapy: Patient Spontanous Breathing and Patient connected to nasal cannula oxygen  Post-op Assessment: Report given to PACU RN, Post -op Vital signs reviewed and stable and Patient moving all extremities X 4  Post vital signs: Reviewed and stable  Complications: No apparent anesthesia complications

## 2012-03-05 NOTE — Anesthesia Postprocedure Evaluation (Signed)
  Anesthesia Post-op Note  Patient: [name] [name]  Procedure(s) Performed: Procedure(s) (LRB) with comments: MEDIASTINOTOMY CHAMBERLAIN MCNEIL (Left) - Left  ANTERIOR MEDIASTINOTOMY(CHAMBERLAIN)  Patient Location: PACU  Anesthesia Type: General  Level of Consciousness: awake, alert  and oriented  Airway and Oxygen Therapy: Patient Spontanous Breathing and Patient connected to nasal cannula oxygen  Post-op Pain: mild  Post-op Assessment: Post-op Vital signs reviewed and Patient's Cardiovascular Status Stable  Post-op Vital Signs: stable  Complications: No apparent anesthesia complications

## 2012-03-05 NOTE — H&P (View-Only) (Signed)
PCP is [name], MD Referring Provider is [name], MD  Chief Complaint  Patient presents with  . Mediastinal Mass    anterior...eval and treat    HPI: [name] [name] is a 41 year old woman who presents with a chief complaint of chest discomfort and shortness of breath with exertion.  She says her symptoms date back about 2-1/2-3 years. She previously was very active. She began having episodes when she would exert herself that caused her to feel like her heart was "beating out of her chest" she. She also would have a feeling that she couldn't get a full inspiration and that he felt like "there was air at the top of the lungs but none at the bottom". This has progressively gotten worse over time. She had been seen on several occasions, but no underlying cause was discerned. She has had to stop many activities that she formally engaged in such as hiking and tennis. She's not had any fevers or chills but does have occasional night sweats.  She recently saw Dr. [name]. In reviewing her records she found a chest x-ray from April of 2012 which showed an abnormal mediastinal contour. He ordered a CT of the chest which showed a large anterior mediastinal mass surrounding and encasing the great vessels.  Past Medical History  Diagnosis Date  . Fibromyalgia   . Eczema   . Hypertension   . Dyspnea   . Depression   . Restless leg     No past surgical history on file.  No family history on file.  Social History History  Substance Use Topics  . Smoking status: Never Smoker   . Smokeless tobacco: Never Used  . Alcohol Use: No    Current Outpatient Prescriptions  Medication Sig Dispense Refill  . desvenlafaxine (PRISTIQ) 50 MG 24 hr tablet Take 50 mg by mouth daily.      Marland Kitchen gabapentin (NEURONTIN) 600 MG tablet Take 300 mg by mouth at bedtime. One or two hs      . lisinopril-hydrochlorothiazide (PRINZIDE,ZESTORETIC) 20-12.5 MG per tablet Take 1 tablet by mouth daily.      . Multiple  Minerals-Vitamins (CALCIUM & VIT D3 BONE HEALTH PO) Take by mouth.      . norethindrone-ethinyl estradiol (JUNEL FE,GILDESS FE,LOESTRIN FE) 1-20 MG-MCG tablet Take 1 tablet by mouth daily.      Marland Kitchen tiZANidine (ZANAFLEX) 4 MG capsule Take 4 mg by mouth at bedtime.      . vitamin B-12 (CYANOCOBALAMIN) 1000 MCG tablet Take 1,000 mcg by mouth daily.      . vitamin C (ASCORBIC ACID) 500 MG tablet Take 500 mg by mouth daily.        Allergies  Allergen Reactions  . Sulfur Anaphylaxis    Review of Systems  Constitutional: Positive for activity change and fatigue. Negative for fever, chills and unexpected weight change.  Respiratory: Positive for chest tightness and shortness of breath.   Cardiovascular: Positive for chest pain.  Genitourinary: Negative.   Musculoskeletal:       Muscle pain, restless leg syndrome  Neurological: Negative for dizziness, seizures and light-headedness.  Hematological: Does not bruise/bleed easily.  Psychiatric/Behavioral: Negative.   All other systems reviewed and are negative.    BP 143/90  Pulse 99  Resp 16  Ht 5\' 6"  (1.676 m)  Wt 195 lb (88.451 kg)  BMI 31.47 kg/m2  SpO2 96% Physical Exam  Vitals reviewed. Constitutional: She is oriented to person, place, and time. She appears well-developed and well-nourished. No distress.  HENT:  Head: Normocephalic and atraumatic.  Eyes: EOM are normal. Pupils are equal, round, and reactive to light.  Neck: Neck supple. No thyromegaly present.  Cardiovascular: Normal rate and regular rhythm.   Murmur (2/6 systolic) heard. Pulmonary/Chest: Effort normal and breath sounds normal. She has no wheezes. She has no rales.  Abdominal: Soft. There is no tenderness.  Musculoskeletal: She exhibits no edema.  Lymphadenopathy:    She has cervical adenopathy (questionable lower right cervical node).  Neurological: She is alert and oriented to person, place, and time. No cranial nerve deficit.  Skin: Skin is warm and dry.      Diagnostic Tests: CT Chest 02/26/12 Technique: Multidetector CT imaging of the chest was performed  following the standard protocol during bolus administration of  intravenous contrast.  Contrast: 80mL OMNIPAQUE IOHEXOL 300 MG/ML SOLN  Comparison: None.  Findings:  There is no enlarged supraclavicular or axillary adenopathy. Large  anterior mediastinal mass is identified measuring 8.30 x 12.1 x 6.7  cm. This mass is encasing the great vessels. There is significant  narrowing of the left subclavian vein. There is also encasement and  narrowing of the left common carotid artery. There is no hilar  adenopathy. No pericardial or pleural effusion.  Lungs are clear. No pulmonary nodule or pulmonary mass identified.  Limited imaging through the upper abdomen is unremarkable.  IMPRESSION:  1. Large anterior mediastinal mass is identified and is worrisome  for tumor. This is encasing the great vessels and S causing  significant narrowing of the left subclavian vein. Recommend  further evaluation with PET CT and tissue sampling.   Impression: 41 year old woman with newly discovered anterior mediastinal mass. I went to great detail with patient and her husband regarding the CT findings and reviewed the films with them. We talked about the differential diagnosis. They understand that different diagnosis includes lymphoma, thymic tumors, germ cell tumors and thyroid. They understand this lesion is more likely to be malignant than benign. Given the appearance of her lesion I think lymphoma is the most likely diagnosis. Malignant germ cell tumors are much less common in women, but not unheard of..  We need to establish a definitive diagnosis. I will order germ cell tumor markers. We will go ahead and schedule her for a left anterior mediastinotomy [name] [name] procedure) pending the results of the blood tests. She will also need a PET/CT but I do not want to delay the biopsy.  I discussed the  indications, risks, benefits, and alternatives for a left anterior mediastinotomy. They understand this is far more likely to yield a definitive diagnosis than attempted needle aspiration. They understand the general nature of the procedure, the incision to be used, and the plan for general anesthesia. They also understand that this is a diagnostic and not a therapeutic procedure. They understand the risks include but are not limited to those associated with general anesthesia, bleeding, infection, as well as the extremely unlikely possibilities of MI, DVT, PE. She accepts these risks and wishes to proceed.   Plan: 1. Alpha-fetoprotein and beta hCG levels 2. Schedule PET/CT 3. Left anterior mediastinotomy under general anesthesia on Tuesday, September 24. We will plan to do this as an outpatient procedure.

## 2012-03-05 NOTE — Interval H&P Note (Signed)
History and Physical Interval Note:  03/05/2012 7:31 AM  [name] [name]  has presented today for surgery, with the diagnosis of ANTERIOR MEDIASTINAL MASS  The various methods of treatment have been discussed with the patient and family. After consideration of risks, benefits and other options for treatment, the patient has consented to  Procedure(s) (LRB) with comments: MEDIASTINOTOMY CHAMBERLAIN MCNEIL (Left) - (L) ANTERIOR MEDIASTINOTOMY(CHAMBERLAIN) as a surgical intervention .  The patient's history has been reviewed, patient examined, no change in status, stable for surgery.  I have reviewed the patient's chart and labs.  Questions were answered to the patient's satisfaction.     HENDRICKSON,STEVEN C

## 2012-03-06 ENCOUNTER — Other Ambulatory Visit: Payer: Self-pay | Admitting: *Deleted

## 2012-03-06 DIAGNOSIS — G8918 Other acute postprocedural pain: Secondary | ICD-10-CM

## 2012-03-06 LAB — TYPE AND SCREEN
ABO/RH(D): A POS
Antibody Screen: NEGATIVE
Unit division: 0
Unit division: 0

## 2012-03-06 MED ORDER — TRAMADOL HCL 50 MG PO TABS: 50.0000 mg | ORAL_TABLET | Freq: Four times a day (QID) | ORAL | Status: DC | PRN

## 2012-03-08 NOTE — Op Note (Signed)
NAMECHRISTIA, DOMKE NO.:  [id number]  MEDICAL RECORD NO.:  [id number]  LOCATION:  [facility]                         FACILITY:  [facility]  PHYSICIAN:  Salvatore Decent. Dorris Fetch, M.D.DATE OF BIRTH:  [birth year]  DATE OF PROCEDURE:  03/05/2012 DATE OF DISCHARGE:  03/05/2012                              OPERATIVE REPORT   PREOPERATIVE DIAGNOSIS:  Anterior mediastinal mass.  POSTOPERATIVE DIAGNOSIS:  Anterior mediastinal mass.  PROCEDURE:  Left anterior mediastinotomy Lula Olszewski procedure).  SURGEON:  Salvatore Decent. Dorris Fetch, MD  ASSISTANT:  Doree Fudge, PA  ANESTHESIA:  General.  FINDINGS:  Large anterior mediastinal mass.  Multiple biopsies taken. Frozen section revealed lymphoid cells and fibrosis, but not able to make a definitive diagnosis based on the frozen section.  CLINICAL NOTE:  Ms. Abraha is a 41 year old woman, who presents with a chief complaint of chest discomfort and shortness of breath with exertion.  She had a workup which revealed a large anterior mediastinal mass.  She was referred for evaluation and was advised to undergo a left anterior mediastinotomy for incisional biopsy of the mass.  She understood this was a diagnostic and not a therapeutic procedure.  The indications, risks, benefits, and alternatives were discussed in detail with her.  She understood and accepted the risks and agreed to proceed.  OPERATIVE NOTE:  Ms. Strausser was brought to the preop holding area on March 05, 2012.  There, Anesthesia established intravenous access. Intravenous antibiotics were administered.  She was taken to the operating room, anesthetized, and intubated.  Chest and abdomen were prepped and draped in usual sterile fashion.  An incision was made transversely in approximately the third intercostal space on the left side.  It was carried through the skin and subcutaneous tissue. Hemostasis was achieved with electrocautery.  Pectoralis muscles  were separated.  The intercostal muscles were divided.  There was a large anterior mediastinal mass immediately upon entering the chest.  A wedge of tissue was excised with the mass and sent for frozen section. Hemostasis was achieved.  While awaiting the results of frozen section, additional biopsies were taken.  These will be sent for permanent pathology only.  Frozen section returned with lymphoid cells and fibrosis, but no definitive diagnosis could be made on frozen section. Hemostasis was achieved.  Surgicel was applied to the area that was biopsied.  The pectoralis muscles were closed with a running #1 Vicryl suture, the subcutaneous tissue with a running 2-0 Vicryl suture, and the skin was closed with a 3-0 Vicryl subcuticular suture.  All sponge, needle, and instrument counts were correct at the end of the procedure.  The patient was extubated in the operating room and taken to the postanesthetic care unit in good condition.     Salvatore Decent Dorris Fetch, M.D.     SCH/MEDQ  D:  03/07/2012  T:  03/08/2012  Job:  [id number]

## 2012-03-11 ENCOUNTER — Other Ambulatory Visit: Payer: Self-pay | Admitting: Thoracic Surgery (Cardiothoracic Vascular Surgery)

## 2012-03-11 DIAGNOSIS — R222 Localized swelling, mass and lump, trunk: Secondary | ICD-10-CM

## 2012-03-12 ENCOUNTER — Ambulatory Visit (INDEPENDENT_AMBULATORY_CARE_PROVIDER_SITE_OTHER): Payer: Self-pay | Admitting: Thoracic Surgery (Cardiothoracic Vascular Surgery)

## 2012-03-12 ENCOUNTER — Encounter: Payer: Self-pay | Admitting: Thoracic Surgery (Cardiothoracic Vascular Surgery)

## 2012-03-12 VITALS — BP 140/91 | HR 98 | Resp 18 | Ht 66.0 in | Wt 195.0 lb

## 2012-03-12 DIAGNOSIS — R222 Localized swelling, mass and lump, trunk: Secondary | ICD-10-CM

## 2012-03-12 DIAGNOSIS — Z09 Encounter for follow-up examination after completed treatment for conditions other than malignant neoplasm: Secondary | ICD-10-CM

## 2012-03-12 DIAGNOSIS — J9859 Other diseases of mediastinum, not elsewhere classified: Secondary | ICD-10-CM

## 2012-03-12 DIAGNOSIS — C8190 Hodgkin lymphoma, unspecified, unspecified site: Secondary | ICD-10-CM

## 2012-03-12 NOTE — Progress Notes (Signed)
  HPI:  Mrs. Devino returns today for followup after having an anterior mediastinotomy the last week. 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 She has minimal discomfort her incision site. She does complain of some difficulty swallowing.  Past Medical History  Diagnosis Date  . Fibromyalgia   . Eczema   . Hypertension   . Dyspnea   . Depression   . Restless leg   . Heart murmur     Told 'nothing to worry about"  No 2D Echo  . Sleep apnea     Sleep Study- 1996- "little apnea- no tx"  . Headache     Birth control pills help  . Hemorrhoid   . Cancer   . Anemia     As a child and while pregnant      Current Outpatient Prescriptions  Medication Sig Dispense Refill  . desvenlafaxine (PRISTIQ) 50 MG 24 hr tablet Take 50 mg by mouth daily.      Marland Kitchen gabapentin (NEURONTIN) 600 MG tablet Take 600 mg by mouth at bedtime.       Marland Kitchen ibuprofen (ADVIL,MOTRIN) 200 MG tablet Take 400 mg by mouth every 8 (eight) hours as needed. For pain      . lisinopril-hydrochlorothiazide (PRINZIDE,ZESTORETIC) 20-12.5 MG per tablet Take 1 tablet by mouth daily.      . norethindrone-ethinyl estradiol (JUNEL FE,GILDESS FE,LOESTRIN FE) 1-20 MG-MCG tablet Take 1 tablet by mouth daily.      Marland Kitchen tiZANidine (ZANAFLEX) 4 MG capsule Take 4-6 mg by mouth at bedtime.       . traMADol (ULTRAM) 50 MG tablet Take 1 tablet (50 mg total) by mouth every 6 (six) hours as needed for pain (may take one or two tablets every six hrs prn).  40 tablet  0    Physical Exam BP 140/91  Pulse 98  Resp 18  Ht 5\' 6"  (1.676 m)  Wt 195 lb (88.451 kg)  BMI 31.47 kg/m2  SpO2 98%  LMP 12/04/2011 Anxious 41 year old white female in no acute distress Incision clean dry and intact  Diagnostic Tests:  Surgical pathology shows classical Hodgkin's lymphoma  Impression: 41 year old woman with a large anterior mediastinal mass. This turned out to be Hodgkin's lymphoma. I discussed the pathology  results with her and her husband. Hodgkin's disease is really the most favorable diagnosis possible with this lesion in her chest.  She needs a referral to oncology. We will set her up for an appointment with Dr. Glenford Peers in [place] as soon as possible. She will likely also need a radiation oncology referral, but I will defer that decision to Dr. Mariel Sleet  Plan: Referral to Dr. Glenford Peers  I will be happy to see her back be of any further assistance with her care.

## 2012-03-13 ENCOUNTER — Encounter (HOSPITAL_COMMUNITY)
Admission: RE | Admit: 2012-03-13 | Discharge: 2012-03-13 | Disposition: A | Discharge status: Home / Self Care | Payer: BC Managed Care – PPO | Source: Ambulatory Visit | Attending: Thoracic Surgery (Cardiothoracic Vascular Surgery) | Admitting: Thoracic Surgery (Cardiothoracic Vascular Surgery)

## 2012-03-13 DIAGNOSIS — D381 Neoplasm of uncertain behavior of trachea, bronchus and lung: Secondary | ICD-10-CM | POA: Insufficient documentation

## 2012-03-13 LAB — GLUCOSE, CAPILLARY: Glucose-Capillary: 94 mg/dL (ref 70–99)

## 2012-03-13 MED ORDER — FLUDEOXYGLUCOSE F - 18 (FDG) INJECTION
16.4000 | Freq: Once | INTRAVENOUS | Status: AC | PRN
  Administered 2012-03-13: 16.4 via INTRAVENOUS

## 2012-03-18 ENCOUNTER — Encounter (HOSPITAL_COMMUNITY): Payer: Self-pay | Admitting: Oncology

## 2012-03-18 ENCOUNTER — Encounter (HOSPITAL_COMMUNITY): Payer: BC Managed Care – PPO | Attending: Oncology | Admitting: Oncology

## 2012-03-18 VITALS — BP 138/79 | HR 94 | Temp 98.7°F | Resp 18 | Ht 66.0 in | Wt 195.5 lb

## 2012-03-18 DIAGNOSIS — C8119 Nodular sclerosis classical Hodgkin lymphoma, extranodal and solid organ sites: Secondary | ICD-10-CM

## 2012-03-18 DIAGNOSIS — C8190 Hodgkin lymphoma, unspecified, unspecified site: Secondary | ICD-10-CM

## 2012-03-18 DIAGNOSIS — I10 Essential (primary) hypertension: Secondary | ICD-10-CM | POA: Insufficient documentation

## 2012-03-18 DIAGNOSIS — G2581 Restless legs syndrome: Secondary | ICD-10-CM | POA: Insufficient documentation

## 2012-03-18 DIAGNOSIS — F411 Generalized anxiety disorder: Secondary | ICD-10-CM

## 2012-03-18 MED ORDER — ALLOPURINOL 300 MG PO TABS: 300.0000 mg | ORAL_TABLET | Freq: Every day | ORAL | Status: DC

## 2012-03-18 NOTE — Patient Instructions (Addendum)
[facility]  Discharge Instructions  RECOMMENDATIONS MADE BY THE CONSULTANT AND ANY TEST RESULTS WILL BE SENT TO YOUR REFERRING DOCTOR.   EXAM FINDINGS BY MD TODAY AND SIGNS AND SYMPTOMS TO REPORT TO CLINIC OR PRIMARY MD: Exam and discussion by MD.  Need to complete your staging and do some additional tests.  Will do a bone marrow biopsy and aspiration on Wednesday morning and will get you scheduled for Pulmonary Function Tests, 2 D Echocardiogram, port a cath placement and radiation consultation.  The pulmonary function test and the 2 D Echocardiogram are to ensure that you have no problems prior to chemotherapy.  We will also get you scheduled for Chemotherapy Teaching with Rosana Berger our Nurse Navigator.  We plan to treat you with 6 - 8 cycles of chemotherapy (1 cycle is 28 days and consists of 2 treatment each cycle)  Would re-evaluate after 3 cycles of therapy.    MEDICATIONS PRESCRIBED: Allopurinol 300 mg - take 1 daily Xanax 1 mg - take 1 tablet 4 hours and 1 hour prior to coming for your procedure on Wednesday - then as needed for sleep. Oxycodone - take 2 tablets 1 hour prior to coming in for your procedure on Wednesday. Follow label directions and May cause drowsiness, do not operate machinery while taking this medication  INSTRUCTIONS GIVEN AND DISCUSSED: Other : Prior to coming for the procedure on Wednesday, take the medications as prescribed.  Do not eat or drink anything prior to coming Wednesday Morning.  Have someone to bring you as you will not be able to drive. Have the person from [facility] call us on Wednesday and let us know what labs they want drawn and we will get them on Wednesday morning. Tobie Lords, RN [phone] - Message can be left on my voicemail)  SPECIAL INSTRUCTIONS/FOLLOW-UP: Return to Clinic : as scheduled.   I acknowledge that I have been informed and understand all the instructions given to me and received a copy. I do not  have any more questions at this time, but understand that I may call the Specialty Clinic at [facility] at [phone] during business hours should I have any further questions or need assistance in obtaining follow-up care.    __________________________________________  _____________  __________ Signature of Patient or Authorized Representative            Date                   Time    __________________________________________ Nurse's Signature

## 2012-03-18 NOTE — Progress Notes (Signed)
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 She has symptoms of itching and nonspecific rash for 2 to-2-1/2 years. She has not had a bone marrow aspirate and biopsy or full blood work evaluation. She also needs a 2-D echocardiogram as well as bony function studies. She will need a Port-A-Cath placed by Dr. Dorris Fetch and she will need a radiation therapy consult because of the size of the process and her chest. Diagnosis #2 history of restless leg syndrome for which she takes gabapentin Diagnosis #3 hypertension on Zestoretic Diagnosis #4 chronic anxiety with nervous tic Diagnosis #5 severe migraines in the past for which she is on monthly birth control pills times many years.   This pleasant lady states that she became more more winded and short of breath over the last 2 years or slightly longer. She switched primary care physician's and was evaluated by Dr. [name] who found the above-mentioned abnormal x-ray which led to scans including PET scan and workup and biopsy by Dr. Dorris Fetch. This mass revealed itself to be classic Hodgkin's disease, nodular sclerosing type. She is here today for consultation. She has no B. symptoms in fact she gained about 40-50 pounds and she has not been able to become as active as she once was. She has no fevers or night sweats but she has had itching which started 2 years ago or so and has become a chronic daily complaint.  She is married for the third time she and her present husband have been together 15 years but married possibly 7 years. She is originally from [place]. She has one daughter by her second marriage she was 33 years old and in good health. She lost one pregnancy. She is not a smoker. Uses alcohol rarely. Parents are both deceased essentially. She was abandoned by her father many many years ago but the menorrhagia sure is now deceased along with her mother. She has one biological sister who has rheumatoid  arthritis and fibromyalgia. Vital signs recorded in the chart. She has no palpable lymphadenopathy. She has decreased to absent posterior tibialis pulses but dorsalis pedis pulses are 2+ and symmetrical. She has a normal breast exam bilaterally. She breast-fed her child but has never had mammography. Her lungs are clear. Heart shows a regular rhythm and rate without murmur rub or gallop. Her abdomen is soft and nontender without organomegaly. Skin exam shows dry skin but no distinct rash. She has a nervous habit of moving her neck to the side and tossing her hair which she does repetitively throughout the history and physical. HEENT exam is unremarkable. She needs blood work, bone marrow aspirate and biopsy, 2-D echo, bony function studies, and a radiation therapy consultation. She needs a Port-A-Cath placement by Dr. Dorris Fetch and then we can start chemotherapy within the next 2 weeks. She is very anxious. She did not want to pursue the bone marrow aspirate and biopsy. We have scheduled her for Wednesday but I have offered her a bone marrow biopsy in the operating room if she so desires but we would have to schedule that and that would delay the onset of therapy in my opinion. She plans to arrived here Wednesday having taken Xanax 1 mg 4 hours, and one hour before arrival and 2 Percocet one hour before arrival. If she does not want to pursue that we can get interventional radiology to do the bone marrow aspirate biopsy as an alternative option other than the operating room. She would need ABVD chemotherapy  plus or minus radiation therapy to involved field in my opinion. She knows she'll need at least 3 cycles of chemotherapy followed by evaluation of involved areas. She'll need a minimum of 6 cycles of chemotherapy.

## 2012-03-19 ENCOUNTER — Other Ambulatory Visit: Payer: Self-pay | Admitting: *Deleted

## 2012-03-19 DIAGNOSIS — C8190 Hodgkin lymphoma, unspecified, unspecified site: Secondary | ICD-10-CM

## 2012-03-20 ENCOUNTER — Encounter: Payer: Self-pay | Admitting: Radiation Oncology

## 2012-03-20 ENCOUNTER — Ambulatory Visit
Admission: RE | Admit: 2012-03-20 | Discharge: 2012-03-20 | Disposition: A | Discharge status: Home / Self Care | Payer: BC Managed Care – PPO | Source: Ambulatory Visit | Attending: Radiation Oncology | Admitting: Radiation Oncology

## 2012-03-20 ENCOUNTER — Encounter (HOSPITAL_BASED_OUTPATIENT_CLINIC_OR_DEPARTMENT_OTHER): Payer: BC Managed Care – PPO | Admitting: Oncology

## 2012-03-20 ENCOUNTER — Ambulatory Visit (HOSPITAL_COMMUNITY)
Admission: RE | Admit: 2012-03-20 | Discharge: 2012-03-20 | Disposition: A | Discharge status: Home / Self Care | Payer: BC Managed Care – PPO | Source: Ambulatory Visit | Attending: Oncology | Admitting: Oncology

## 2012-03-20 ENCOUNTER — Other Ambulatory Visit (HOSPITAL_COMMUNITY): Payer: BC Managed Care – PPO

## 2012-03-20 VITALS — BP 132/83 | HR 90 | Temp 98.2°F | Resp 18 | Ht 66.0 in | Wt 195.9 lb

## 2012-03-20 VITALS — BP 114/72 | HR 74 | Temp 98.6°F | Resp 16

## 2012-03-20 DIAGNOSIS — IMO0001 Reserved for inherently not codable concepts without codable children: Secondary | ICD-10-CM | POA: Insufficient documentation

## 2012-03-20 DIAGNOSIS — C8190 Hodgkin lymphoma, unspecified, unspecified site: Secondary | ICD-10-CM | POA: Insufficient documentation

## 2012-03-20 DIAGNOSIS — I10 Essential (primary) hypertension: Secondary | ICD-10-CM | POA: Insufficient documentation

## 2012-03-20 DIAGNOSIS — Z79899 Other long term (current) drug therapy: Secondary | ICD-10-CM | POA: Insufficient documentation

## 2012-03-20 DIAGNOSIS — C8191 Hodgkin lymphoma, unspecified, lymph nodes of head, face, and neck: Secondary | ICD-10-CM | POA: Insufficient documentation

## 2012-03-20 DIAGNOSIS — R1310 Dysphagia, unspecified: Secondary | ICD-10-CM | POA: Insufficient documentation

## 2012-03-20 DIAGNOSIS — L299 Pruritus, unspecified: Secondary | ICD-10-CM | POA: Insufficient documentation

## 2012-03-20 DIAGNOSIS — R0602 Shortness of breath: Secondary | ICD-10-CM | POA: Insufficient documentation

## 2012-03-20 DIAGNOSIS — G4730 Sleep apnea, unspecified: Secondary | ICD-10-CM | POA: Insufficient documentation

## 2012-03-20 HISTORY — PX: BONE MARROW BIOPSY: SHX199

## 2012-03-20 LAB — CBC WITH DIFFERENTIAL/PLATELET
Basophils Absolute: 0 10*3/uL (ref 0.0–0.1)
Basophils Relative: 0 % (ref 0–1)
Eosinophils Absolute: 0.1 10*3/uL (ref 0.0–0.7)
Eosinophils Relative: 2 % (ref 0–5)
HCT: 37.8 % (ref 36.0–46.0)
Hemoglobin: 13.5 g/dL (ref 12.0–15.0)
Lymphocytes Relative: 15 % (ref 12–46)
Lymphs Abs: 1.1 10*3/uL (ref 0.7–4.0)
MCH: 31.3 pg (ref 26.0–34.0)
MCHC: 35.7 g/dL (ref 30.0–36.0)
MCV: 87.7 fL (ref 78.0–100.0)
Monocytes Absolute: 0.5 10*3/uL (ref 0.1–1.0)
Monocytes Relative: 6 % (ref 3–12)
Neutro Abs: 5.8 10*3/uL (ref 1.7–7.7)
Neutrophils Relative %: 77 % (ref 43–77)
Platelets: 359 10*3/uL (ref 150–400)
RBC: 4.31 MIL/uL (ref 3.87–5.11)
RDW: 12.6 % (ref 11.5–15.5)
WBC: 7.6 10*3/uL (ref 4.0–10.5)

## 2012-03-20 LAB — HIV ANTIBODY (ROUTINE TESTING W REFLEX): HIV: NONREACTIVE

## 2012-03-20 LAB — COMPREHENSIVE METABOLIC PANEL
ALT: 48 U/L — ABNORMAL HIGH (ref 0–35)
AST: 28 U/L (ref 0–37)
Albumin: 3.7 g/dL (ref 3.5–5.2)
Alkaline Phosphatase: 125 U/L — ABNORMAL HIGH (ref 39–117)
BUN: 15 mg/dL (ref 6–23)
CO2: 23 mEq/L (ref 19–32)
Calcium: 9.9 mg/dL (ref 8.4–10.5)
Chloride: 102 mEq/L (ref 96–112)
Creatinine, Ser: 0.79 mg/dL (ref 0.50–1.10)
GFR calc Af Amer: >90 mL/min (ref >90)
GFR calc non Af Amer: >90 mL/min (ref >90)
Glucose, Bld: 101 mg/dL — ABNORMAL HIGH (ref 70–99)
Potassium: 3.8 mEq/L (ref 3.5–5.1)
Sodium: 137 mEq/L (ref 135–145)
Total Bilirubin: 0.6 mg/dL (ref 0.3–1.2)
Total Protein: 7.6 g/dL (ref 6.0–8.3)

## 2012-03-20 LAB — IRON AND TIBC
Iron: 92 ug/dL (ref 42–135)
Saturation Ratios: 20 % (ref 20–55)
TIBC: 451 ug/dL (ref 250–470)
UIBC: 359 ug/dL (ref 125–400)

## 2012-03-20 LAB — LACTATE DEHYDROGENASE: LDH: 209 U/L (ref 94–250)

## 2012-03-20 LAB — SEDIMENTATION RATE: Sed Rate: 18 mm/hr (ref 0–22)

## 2012-03-20 LAB — FERRITIN: Ferritin: 32 ng/mL (ref 10–291)

## 2012-03-20 MED ORDER — ALBUTEROL SULFATE (5 MG/ML) 0.5% IN NEBU
2.5000 mg | INHALATION_SOLUTION | Freq: Once | RESPIRATORY_TRACT | Status: AC
  Administered 2012-03-20: 2.5 mg via RESPIRATORY_TRACT

## 2012-03-20 NOTE — Patient Instructions (Addendum)
[facility]  Discharge Instructions  RECOMMENDATIONS MADE BY THE CONSULTANT AND ANY TEST RESULTS WILL BE SENT TO YOUR REFERRING DOCTOR.   EXAM FINDINGS BY MD TODAY AND SIGNS AND SYMPTOMS TO REPORT TO CLINIC OR PRIMARY MD: You had a bone marrow biopsy and aspiration performed.  Keep dressing in place clean and dry for next 24 hours.  No strenuous activity or driving for next 24 hours.  If bleeding occurs apply direct pressure to site for 10 minutes.  If unable to stop the bleeding go to the Emergency Department.  MEDICATIONS PRESCRIBED: none ( You can take the oxycodone or ibuprofen if needed for discomfort.)     SPECIAL INSTRUCTIONS/FOLLOW-UP: Return to Clinic :  See Schedule.   I acknowledge that I have been informed and understand all the instructions given to me and received a copy. I do not have any more questions at this time, but understand that I may call the Specialty Clinic at [facility] at [phone] during business hours should I have any further questions or need assistance in obtaining follow-up care.    __________________________________________  _____________  __________ Signature of Patient or Authorized Representative            Date                   Time    __________________________________________ Nurse's Signature

## 2012-03-20 NOTE — Pre-Procedure Instructions (Signed)
20 [name] [name]  03/20/2012   Your procedure is scheduled on:  Tuesday October 15   Report to [facility] at 5:30 AM.  Call this number if you have problems the morning of surgery: [phone]   Remember:   Do not eat or drink:After Midnight.    Take these medicines the morning of surgery with A SIP OF WATER: Pristiq, allopurinol. Tramadol if needed   Do not wear jewelry, make-up or nail polish.  Do not wear lotions, powders, or perfumes. You may wear deodorant.  Do not shave 48 hours prior to surgery. Men may shave face and neck.  Do not bring valuables to the hospital.  Contacts, dentures or bridgework may not be worn into surgery.  Leave suitcase in the car. After surgery it may be brought to your room.  For patients admitted to the hospital, checkout time is 11:00 AM the day of discharge.   Patients discharged the day of surgery will not be allowed to drive home.  Name and phone number of your driver: family  Special Instructions: Shower using CHG 2 nights before surgery and the night before surgery.  If you shower the day of surgery use CHG.  Use special wash - you have one bottle of CHG for all showers.  You should use approximately 1/3 of the bottle for each shower.   Please read over the following fact sheets that you were given: Pain Booklet, Coughing and Deep Breathing and Surgical Site Infection Prevention

## 2012-03-20 NOTE — Progress Notes (Signed)
Procedure note: After informed consent was obtained the patient was placed in the prone position for a bone marrow aspiration and biopsy for routine evaluation and flow cytometry. She has a diagnosis of Hodgkin's disease. A bone marrow biopsy was for staging purposes. She had both posterior superior iliac spinous processes identified in the left was chosen. The area was cleansed with 3 Betadine swabs and anesthetized with 9 cc of plain 2% Xylocaine. A aspirate for routine evaluation and flow cytometry was obtained without incident. A biopsy was obtained. No complications were encountered and this was done under sterile procedure conditions. She was sent home in good condition

## 2012-03-20 NOTE — Progress Notes (Signed)
41 year old female. Married. One daughter.  Hodgkin's disease classic nodular sclerosing type per pathology from 03/05/2012 biopsy of anterior mediastinal mass. Referred by Dr. Mariel Sleet for radiation therapy consult because of the size of the process in her chest.  03/13/2012 PET confirmed hypermetabolic anterior mediastinal mass with extension of dominant mass in the lower left neck. A small isolated right lower jugular hypermetabolic node revealed on PET, as well.   AX: Sulfur No indication of a pacemaker No hx of radiation therapy

## 2012-03-20 NOTE — Progress Notes (Signed)
Patient presented to the clinic today accompanied by her husband for a consultation with Dr. Michell Heinrich to discuss the role of radiation therapy in the treatment of Hodgkin's lymphoma related mediastinal mass. Patient is alert and oriented to person, place, and time. No distress noted. Slow steady gait noted. Pleasant affect noted. Left upper chest biopsy incision site well approximated without redness, drainage or edema. Patient reports her only pain is in her left hip 3 on a scale of 0-10 related to effects of today's biopsy. Patient reports difficulty swallowing food and pills. Patient has to chop foods fine and wait for pills to dissolve once she swallows them. Patient denies nausea, vomiting, headache, dizziness, or diarrhea. Patient denies weight loss. Patient reports a normal appetite. Patient reports a constant heaviness in her chest. Patient reports that at times she feels like she has to force herself to breath. Patient reports that she is no longer able to exert herself because she simply can't breath. Patient reports an occasional dry cough. Patient reports that the least little exertion causes the "strange dry cough to present." patient reports a dry itchy oblong rash that moves all over her body. She reports that the rash will dry up in one place and move to another. Patient reports trouble with her low back that can make it difficulty for her to lay flat for prolonged periods of time. Reported all findings to Dr. Michell Heinrich.   NKDA Denies having a pacemaker No hx of radiation therapy  Bone marrow biopsy done today PFTs done today 10/10 pre op 10/11 echo 10/15 chemo teaching

## 2012-03-20 NOTE — Progress Notes (Signed)
See progress note under physician encounter. 

## 2012-03-20 NOTE — Progress Notes (Signed)
[facility] BONE MARROW BIOPSY/ASPIRATE PROGRESS NOTE  [name] [name] presents for Bone Marrow biopsy per MD orders. [name] [name] verbalized understanding of procedure. Xanax 1 mg po 4 hours and 1 hour prior to arrival & Oxycodone 5/325  2 tablets po taken by patient at home 1 hour prior to arrival. Consent reviewed and signed.  [name] [name] positioned supine for procedure. Time-out performed and Bone Marrow Checklist. Procedure began at 0945. Xylocaine 2% 10 cc used for local and administered to patient by Dr. Mariel Sleet. Procedure completed at 1000. Patient tolerated well. Pressure dressing applied to the left hip with instructions to leave in place for 24 hours. Patient instructed to report any bleeding that saturates dressing and to take pain medication  as needed. Dressing dry and intact to the left hip on discharge.

## 2012-03-21 ENCOUNTER — Encounter (HOSPITAL_COMMUNITY): Payer: Self-pay

## 2012-03-21 ENCOUNTER — Encounter (HOSPITAL_COMMUNITY): Payer: Self-pay | Admitting: Pharmacy Technician

## 2012-03-21 ENCOUNTER — Encounter (HOSPITAL_COMMUNITY)
Admission: RE | Admit: 2012-03-21 | Discharge: 2012-03-21 | Disposition: A | Discharge status: Home / Self Care | Payer: BC Managed Care – PPO | Source: Ambulatory Visit | Attending: Thoracic Surgery (Cardiothoracic Vascular Surgery) | Admitting: Thoracic Surgery (Cardiothoracic Vascular Surgery)

## 2012-03-21 ENCOUNTER — Other Ambulatory Visit (HOSPITAL_COMMUNITY): Payer: Self-pay | Admitting: Oncology

## 2012-03-21 VITALS — BP 122/75 | HR 85 | Temp 98.5°F | Resp 20 | Ht 66.0 in | Wt 195.8 lb

## 2012-03-21 DIAGNOSIS — C8190 Hodgkin lymphoma, unspecified, unspecified site: Secondary | ICD-10-CM

## 2012-03-21 LAB — APTT: aPTT: 29 seconds (ref 24–37)

## 2012-03-21 LAB — PROTIME-INR
INR: 1.06 (ref 0.00–1.49)
Prothrombin Time: 13.7 seconds (ref 11.6–15.2)

## 2012-03-21 LAB — HCG, SERUM, QUALITATIVE: Preg, Serum: NEGATIVE

## 2012-03-21 NOTE — Progress Notes (Signed)
Radiation Oncology         [phone] ________________________________  Initial outpatient Consultation  Name: [name] [name] MRN: [id number]  Date: 03/20/2012  DOB: 01/11/1971  REFERRING PHYSICIAN: Randall An, MD  DIAGNOSIS: The encounter diagnosis was Hodgkin's lymphoma.  HISTORY OF PRESENT ILLNESS::[name] [name] is a 41 y.o. female  who has a 2 year history of increasing shortness of breath with exertion. She also has developed dysphagia to solid food and pills. She is developed itching of her skin. She denies any fevers chills or night sweats. She denies any weight loss. She said she has really gained weight due to her inactivity.  She eventually was worked up with a CT scan of the chest which showed a large anterior mediastinal mass measuring 8 x 12 x 7 cm encasing the great vessels. Placement and narrowing of the left carotid artery and subclavian vein was noted. No hilar adenopathy was noted. She underwent a mediastinal biopsy by Dr. Dorris Fetch on 03/05/2012 which revealed classical Hodgkin's lymphoma. A PET scan on 10-13 showed a hypermetabolic mediastinal mass measuring 7.8 x 13.8 cm with an SUV of 27. Extension into the left lower cervical lymph nodes was noted with a level for right-sided jugular chain lymph node measuring 7 mm with an SUV of 2.8. She underwent a bone marrow biopsy by Dr. Mariel Sleet earlier today. She did not have an elevated sedimentation rate. She is scheduled for echocardiogram, Port-A-Cath placement and began chemotherapy soon. She was sent by medical oncology today for my opinion regarding radiation in the management of her bulky stage II newly diagnosed Hodgkin's disease  PREVIOUS RADIATION THERAPY: No  PAST MEDICAL HISTORY:  has a past medical history of Fibromyalgia; Eczema; Depression; Restless leg; Heart murmur; Sleep apnea; Headache; Hemorrhoid; Anemia; Cancer; Hypertension; and Dyspnea.    PAST SURGICAL HISTORY: Past Surgical History    Procedure Date  . Wisdom tooth extraction   . Dilation and curettage of uterus 1991  . Mediastinal mass biopsy 03/05/12  . Bone marrow biopsy 03/20/2012    FAMILY HISTORY: family history includes Cancer in her maternal grandmother and paternal uncle.  SOCIAL HISTORY:  reports that she has never smoked. She has never used smokeless tobacco. She reports that she does not drink alcohol or use illicit drugs.  ALLERGIES: Sulfur  MEDICATIONS:  Current Outpatient Prescriptions  Medication Sig Dispense Refill  . desvenlafaxine (PRISTIQ) 50 MG 24 hr tablet Take 50 mg by mouth daily.      . fish oil-omega-3 fatty acids 1000 MG capsule Take 1 g by mouth daily.      Marland Kitchen gabapentin (NEURONTIN) 600 MG tablet Take 600 mg by mouth at bedtime.       Marland Kitchen ibuprofen (ADVIL,MOTRIN) 200 MG tablet Take 400 mg by mouth every 8 (eight) hours as needed. For pain      . lisinopril-hydrochlorothiazide (PRINZIDE,ZESTORETIC) 20-12.5 MG per tablet Take 1 tablet by mouth daily.      . norethindrone-ethinyl estradiol (JUNEL FE,GILDESS FE,LOESTRIN FE) 1-20 MG-MCG tablet Take 1 tablet by mouth daily.      Marland Kitchen tiZANidine (ZANAFLEX) 4 MG capsule Take 4-6 mg by mouth at bedtime. Takes 4-6 mg dose depends on severity of spasms.      . traMADol (ULTRAM) 50 MG tablet Take 1 tablet (50 mg total) by mouth every 6 (six) hours as needed for pain (may take one or two tablets every six hrs prn).  40 tablet  0  . cholecalciferol (VITAMIN D) 1000 UNITS tablet  Take 1,000 Units by mouth daily.       No current facility-administered medications for this encounter.   Facility-Administered Medications Ordered in Other Encounters  Medication Dose Route Frequency Provider Last Rate Last Dose  . albuterol (PROVENTIL) (5 MG/ML) 0.5% nebulizer solution 2.5 mg  2.5 mg Nebulization Once Randall An, MD   2.5 mg at 03/20/12 1329    REVIEW OF SYSTEMS:  A 15 point review of systems is documented in the electronic medical record. This was obtained  by the nursing staff. However, I reviewed this with the patient to discuss relevant findings and make appropriate changes.  Pertinent items are noted in HPI.   PHYSICAL EXAM:  height is 5\' 6"  (1.676 m) and weight is 195 lb 14.4 oz (88.86 kg). Her oral temperature is 98.2 F (36.8 C). Her blood pressure is 132/83 and her pulse is 90. Her respiration is 18 and oxygen saturation is 99%.   She is a thin female in no distress sitting comfortably examining table. She has no palpable cervical or supraclavicular lymph nodes. No palpable axillary lymph nodes bilaterally. No palpable epitrochlear lymph nodes. She has normal breath sounds bilaterally. Cranial nerves II through XII are tested and intact.  LABORATORY DATA:  Lab Results  Component Value Date   WBC 7.6 03/20/2012   HGB 13.5 03/20/2012   HCT 37.8 03/20/2012   MCV 87.7 03/20/2012   PLT 359 03/20/2012   Lab Results  Component Value Date   NA 137 03/20/2012   K 3.8 03/20/2012   CL 102 03/20/2012   CO2 23 03/20/2012   Lab Results  Component Value Date   ALT 48* 03/20/2012   AST 28 03/20/2012   ALKPHOS 125* 03/20/2012   BILITOT 0.6 03/20/2012     RADIOGRAPHY: Dg Chest 2 View  03/04/2012  *RADIOLOGY REPORT*  Clinical Data: Known mediastinal mass, hypertension  CHEST - 2 VIEW  Comparison: 02/26/2012  Findings: A large anterior mediastinal mass is again identified stable from recent CT examination.  No focal infiltrate is noted. The cardiac shadow is stable.  No other focal abnormality is seen.  IMPRESSION: Large anterior mediastinal mass consistent with the history.  No acute abnormality is noted.   Original Report Authenticated By: Phillips Odor, M.D.    Ct Chest W Contrast  02/26/2012  *RADIOLOGY REPORT*  Clinical Data: Pulmonary mass  CT CHEST WITH CONTRAST  Technique:  Multidetector CT imaging of the chest was performed following the standard protocol during bolus administration of intravenous contrast.  Contrast: 80mL OMNIPAQUE IOHEXOL 300 MG/ML   SOLN  Comparison: None.  Findings:  There is no enlarged supraclavicular or axillary adenopathy.  Large anterior mediastinal mass is identified measuring 8.30 x 12.1 x 6.7 cm.  This mass is encasing the great vessels.  There is significant narrowing of the left subclavian vein. There is also encasement and narrowing of the left common carotid artery.  There is no hilar adenopathy.  No pericardial or pleural effusion.  Lungs are clear.  No pulmonary nodule or pulmonary mass identified.  Limited imaging through the upper abdomen is unremarkable.  IMPRESSION:  1.  Large anterior mediastinal mass is identified and is worrisome for tumor.  This is encasing the great vessels and S causing significant narrowing of the left subclavian vein.  Recommend further evaluation with PET CT and tissue sampling.   Original Report Authenticated By: Rosealee Albee, M.D.    Nm Pet Image Initial (pi) Skull Base To Thigh  03/13/2012  *  RADIOLOGY REPORT*  Clinical Data: Initial treatment strategy for anterior mediastinal mass.  NUCLEAR MEDICINE PET SKULL BASE TO THIGH  Fasting Blood Glucose:  94  Technique:  16.4 mCi F-18 FDG was injected intravenously. CT data was obtained and used for attenuation correction and anatomic localization only.  (This was not acquired as a diagnostic CT examination.) Additional exam technical data entered on technologist worksheet.  Comparison:  CT chest of 02/26/2012.  No prior plain film.  Findings:  Neck: Mild extension into the left lower cervical region of the anterior mediastinal mass to be detailed below.  In addition, a level IV right-sided jugular chain node measures 7 mm and a S.U.V. max of 2.8 on image 51.  Chest:  Hypermetabolic dominant anterior mediastinal infiltrative mass.  This measures 7.8 x 13.8 cm and a S.U.V. max of 27.3 on image 76.  Similar in size to on the prior exam.  No other abnormal foci of hypermetabolism within the chest.  Small left pleural effusion is minimally increased  from the prior and is not hypermetabolic.  Abdomen/Pelvis:  No abnormal hypermetabolism.  Skeleton:  No hypermetabolism to suggest osseous metastasis.  CT  images performed for attenuation correction demonstrate no further findings within the neck.  Chest findings deferred to recent diagnostic CT. No acute superimposed process.  Small retroperitoneal nodes, none of which are hypermetabolic.  Not pathologic by size criteria.  Trace free pelvic fluid is likely physiologic.  Apparent distal ileal wall thickening on image 201 is favored to be due to underdistension and is not hypermetabolic to the surrounding bowel.  IMPRESSION: 1.  Hypermetabolic anterior mediastinal mass, favored to represent lymphoma.  Differential considerations of less likely thymic carcinoma or germ cell neoplasm. 2.  Extension of dominant mass in the low left neck.  Isolated small right lower jugular hypermetabolic node. Likely also involved with presumed lymphoma. 3.  No subdiaphragmatic disease. 4.  Trace left-sided pleural fluid, slightly increased since 02/26/2012. 5.  Apparent distal ileal wall thickening, favored to be due to underdistension; not hypermetabolic.  If the patient is diagnosed with lymphoma and has right lower quadrant symptoms to suggest unlikely lymphomatous involvement, CT enterography should be considered.   Original Report Authenticated By: Consuello Bossier, M.D.    Dg Chest Port 1 View  03/05/2012  *RADIOLOGY REPORT*  Clinical Data: Left Chamberlain procedure.  Evaluate for pneumothorax.  PORTABLE CHEST - 1 VIEW  Comparison: Chest radiograph 03/04/2012 chest CT 02/26/2012  Findings: The large anterior mediastinal mass now contains surgical clips.  Heart size appears stable, given differences in portable technique.  Lung volumes are low.  No airspace disease, pneumothorax, or pleural effusion is identified.  There is persistent rightward deviation of the trachea secondary to the mass.  IMPRESSION: 1.  Postsurgical  changes of the chest evident, with surgical clips in the region of the anterior mediastinal mass. 2.  No evidence of pneumothorax or other complication.   Original Report Authenticated By: Britta Mccreedy, M.D.       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 We discussed the role of radiation in decreasing local failure and increasing disease-free survival in patients who undergo treatment for Hodgkin's disease. We discussed the radiosensitive he of Hodgkin's disease and the necessity of low doses of treatment. I discussed with her involved field radiation including the anterior mediastinum left and  right neck. We discussed acute and long-term side effects of treatment including but not limited to skin redness sore throat and fatigue. We discussed long-term issues like an increased risk of breast cancer and increased risk of heart disease. We discussed the role of IMR T. to decrease the amount of breast tissue being treated as well as for cardiac sparing. We discussed the radiation will begin after chemotherapy and we would plan 3-4 weeks of treatment as an outpatient. I will plan on seeing her back after treatment is complete. I spent 60 minutes  face to face with the patient and more than 50% of that time was spent in counseling and/or coordination of care.   ------------------------------------------------  Lurline Hare, MD

## 2012-03-21 NOTE — Progress Notes (Signed)
Complete PATIENT MEASURE OF DISTRESS worksheet with a score of 1 submitted to social work. 

## 2012-03-22 ENCOUNTER — Ambulatory Visit (HOSPITAL_COMMUNITY)
Admission: RE | Admit: 2012-03-22 | Discharge: 2012-03-22 | Disposition: A | Discharge status: Home / Self Care | Payer: BC Managed Care – PPO | Source: Ambulatory Visit | Attending: Oncology | Admitting: Oncology

## 2012-03-22 ENCOUNTER — Telehealth (HOSPITAL_COMMUNITY): Payer: Self-pay

## 2012-03-22 DIAGNOSIS — R0602 Shortness of breath: Secondary | ICD-10-CM

## 2012-03-22 DIAGNOSIS — R0989 Other specified symptoms and signs involving the circulatory and respiratory systems: Secondary | ICD-10-CM | POA: Insufficient documentation

## 2012-03-22 DIAGNOSIS — C8190 Hodgkin lymphoma, unspecified, unspecified site: Secondary | ICD-10-CM

## 2012-03-22 DIAGNOSIS — I10 Essential (primary) hypertension: Secondary | ICD-10-CM | POA: Insufficient documentation

## 2012-03-22 DIAGNOSIS — R0609 Other forms of dyspnea: Secondary | ICD-10-CM | POA: Insufficient documentation

## 2012-03-22 NOTE — Telephone Encounter (Signed)
Patient notified that bone marrow biopsy and aspiration was negative for tumor.

## 2012-03-22 NOTE — Addendum Note (Signed)
Encounter addended by: Delynn Flavin, RN on: 03/22/2012  6:23 PM<BR>     Documentation filed: Charges VN

## 2012-03-22 NOTE — Progress Notes (Signed)
*  PRELIMINARY RESULTS* Echocardiogram 2D Echocardiogram has been performed.  [name] [name] M 03/22/2012, 9:07 AM

## 2012-03-24 MED ORDER — ONDANSETRON HCL 8 MG PO TABS: 8.0000 mg | ORAL_TABLET | Freq: Two times a day (BID) | ORAL | Status: DC

## 2012-03-24 MED ORDER — DEXAMETHASONE 4 MG PO TABS: 8.0000 mg | ORAL_TABLET | Freq: Two times a day (BID) | ORAL | Status: DC

## 2012-03-24 MED ORDER — PROCHLORPERAZINE 25 MG RE SUPP: 25.0000 mg | Freq: Four times a day (QID) | RECTAL | Status: DC | PRN

## 2012-03-24 MED ORDER — LORAZEPAM 1 MG PO TABS: 1.0000 mg | ORAL_TABLET | ORAL | Status: DC | PRN

## 2012-03-24 NOTE — Patient Instructions (Signed)
[facility] [name] [name]  DOB [birth year] CSN [id number]  MRN [id number] Dr. Glenford Peers    CHEMOTHERAPY INSTRUCTIONS  Vinblastine - bone marrow suppression (lowers white blood cells (fight infection), lowers red blood cells (make up your blood), lowers platelets (help blood to clot). Hair loss, loss of appetite, jaw pain, peripheral neuropathy (numbness/tingling/burning in hands/fingers/feet/toes, constipation.   Bleomycin - hair loss, kidney toxicity, pulmonary fibrosis, fever, chills, hypersensitivity or anaphylactic reaction (rare). Patients with lymphoma have a higher incidence of anaphylaxis after receiving bleomycin than do other patients who receive the drug. Patients who have received bleomycin are at risk for pulmonary toxicity when exposed to oxygen during surgery. You must always -for the rest of your life- disclose to your surgeon/anesthesiologist that you have taken bleomycin to prevent a fatal episode of pulmonary failure. The first sign of pulmonary toxicity is a cough.   Adriamycin - bone marrow suppression (low white blood cells/low platelets/low red blood cells), hair loss, nausea/vomiting, fatigue, mouth sores, cardiotoxicity (this is why you will have 2D echoes performed periodically during treatment), sensitivity to light - wear sunscreen and sunglasses. This will turn your urine RED for 24-48 hours. Your urine should clear or turn back to yellow within 48 hours. If your urine doesn't clear/turn yellow, please call the Cancer Center [phone].  Dacarbazine - severe neutropenia (low white blood cell count) and severe thrombocytopenia (low platelet count). The nadir (referred to as the time when your blood counts bottom out) for this drug can be 2-3 weeks or more). Severe nausea and vomiting, no appetite, hair loos, rash, flu-like syndrome (may occur up to 7 days after drug administration), low blood pressure, sensitivity to light.     POTENTIAL SIDE EFFECTS OF TREATMENT: Increased Susceptibility to Infection, Vomiting, Constipation, Red or Pink Urine (with Adriamycin), Hair Thinning, Changes in Character of Skin and Nails (brittleness, dryness,etc.), Bone Marrow Suppression, Abdominal Cramping, Complete Hair Loss, Nausea, Diarrhea, Sun Sensitivity and Mouth Sores   SELF IMAGE NEEDS AND REFERRALS MADE: Obtain hair accessories as soon as possible (wigs, scarves, turbans,caps,etc.) and Referral to Look Good, Feel Better consultant   EDUCATIONAL MATERIALS GIVEN AND REVIEWED: Chemotherapy and You Specific Instructions Sheets on Adriamycin, Bleomycin, Vinblastine, Dacarbazine, Dexamethasone, Zofran, Ativan, Compazine, EMLA cream   SELF CARE ACTIVITIES WHILE ON CHEMOTHERAPY: Increase your fluid intake 48 hours prior to treatment and drink at least 2 quarts per day after treatment., No alcohol intake., No aspirin or other medications unless approved by your oncologist., Eat foods that are light and easy to digest., Eat foods at cold or room temperature., No fried, fatty, or spicy foods immediately before or after treatment., Have teeth cleaned professionally before starting treatment. Keep dentures and partial plates clean., Use soft toothbrush and do not use mouthwashes that contain alcohol. Biotene is a good mouthwash that is available at most pharmacies or may be ordered by calling [phone]., Use warm salt water gargles (1 teaspoon salt per 1 quart warm water) before and after meals and at bedtime. Or you may rinse with 2 tablespoons of three -percent hydrogen peroxide mixed in eight ounces of water., Always use sunscreen with SPF (Sun Protection Factor) of 30 or higher., Use your nausea medication as directed to prevent nausea., Use your stool softener or laxative as directed to prevent constipation. and Use your anti-diarrheal medication as directed to stop diarrhea.  Please wash your hands for at least 30 seconds using  warm soapy water. Handwashing is the #1 way  to prevent the spread of germs. Stay away from sick people or people who are getting over a cold. If you develop respiratory systems such as green/yellow mucus production or productive cough or persistent cough let us know and we will see if you need an antibiotic. It is a good idea to keep a pair of gloves on when going into grocery stores/[employer] to decrease your risk of coming into contact with germs on the carts, etc. Carry alcohol hand gel with you at all times and use it frequently if out in public. All foods need to be cooked thoroughly. No raw foods. No medium or undercooked meats, eggs. If your food is cooked medium well, it does not need to be hot pink or saturated with bloody liquid at all. Vegetables and fruits need to be washed/rinsed under the faucet with a dish detergent before being consumed. You can eat raw fruits and vegetables unless we tell you otherwise but it would be best if you cooked them or bought frozen. Do not eat off of salad bars or hot bars unless you really trust the cleanliness of the restaurant. If you need dental work, please let Dr. Mariel Sleet know before you go for your appointment so that we can coordinate the best possible time for you in regards to your chemo regimen. You need to also let your dentist know that you are actively taking chemo. We may need to do labs prior to your dental appointment. We also want your bowels moving at least every other day. If this is not happening, we need to know so that we can get you on a bowel regimen to help you go.     MEDICATIONS: You have been given prescriptions for the following medications:  Dexamethasone 4mg  tablet. Starting the day after chemo, take 2 tablets in the am and 2 tablets in the pm for 3 days. This medication will help reduce nausea/vomiting post chemo. Take as directed. It may also make you generally feel better and give you more energy for a couple of days. Other side  effects include: irritability, trouble sleeping. You will also receive this in your IV before chemotherapy. You may develop a sun kissed glow or reddened appearance to your face, neck, chest, shoulders. This will go away after a couple of days. This is coming from the steroid.   Zofran 8mg  tablet. Starting the day after chemo, take 1 tablet in the am and 1 tablet in the pm for 3 days. Then may take 1 tablet two times a day IF needed for nausea/vomiting. Side Effect: constipation.   Ativan 1mg  tablet. May take 1 tablet every 4 hours IF needed for nausea/vomiting. This tablet may make you sleepy. It may make you unsteady on your feet. Do not drive while taking this medication.   Compazine 25mg  suppository. Insert 1 rectally every 6 hours IF needed for nausea/vomiting.    Over-the-Counter Meds:  Colace - this is a stool softener. Take 100mg  capsule 2-6 times a day as needed. If you have to take more than 6 capsules of Colace a day call the Cancer Center.  Senna - this is a mild laxative used to treat mild constipation. May take 2 tabs by mouth daily or up to twice a day as needed for mild constipation.  Milk of Magnesia - this is a laxative used to treat moderate to severe constipation. May take 2-4 tablespoons every 8 hours as needed. May increase to 8 tablespoons x 1 dose and if no  bowel movement call the Cancer Center.  Imodium - this is for diarrhea. Take 2 tabs after 1st loose stool and then 1 tab after each loose stool until you go a total of 12 hours without a loose stool. Call Cancer Center if loose stools continue.   SYMPTOMS TO REPORT AS SOON AS POSSIBLE AFTER TREATMENT:  FEVER GREATER THAN 100.5 F  CHILLS WITH OR WITHOUT FEVER  NAUSEA AND VOMITING THAT IS NOT CONTROLLED WITH YOUR NAUSEA MEDICATION  UNUSUAL SHORTNESS OF BREATH  UNUSUAL BRUISING OR BLEEDING  TENDERNESS IN MOUTH AND THROAT WITH OR WITHOUT PRESENCE OF ULCERS  URINARY PROBLEMS  BOWEL PROBLEMS  UNUSUAL  RASH    Wear comfortable clothing and clothing appropriate for easy access to any Portacath or PICC line. Let us know if there is anything that we can do to make your therapy better!      I have been informed and understand all of the instructions given to me and have received a copy. I have been instructed to call the clinic [phone] or my family physician as soon as possible for continued medical care, if indicated. I do not have any more questions at this time but understand that I may call the Cancer Center or the Patient Navigator at [phone] during office hours should I have questions or need assistance in obtaining follow-up care.      _________________________________________      _______________     __________ Signature of Patient or Authorized Representative        Date                            Time      _________________________________________ Nurse's Signature

## 2012-03-25 ENCOUNTER — Encounter (HOSPITAL_BASED_OUTPATIENT_CLINIC_OR_DEPARTMENT_OTHER): Payer: BC Managed Care – PPO

## 2012-03-25 DIAGNOSIS — C8190 Hodgkin lymphoma, unspecified, unspecified site: Secondary | ICD-10-CM

## 2012-03-25 DIAGNOSIS — C8119 Nodular sclerosis classical Hodgkin lymphoma, extranodal and solid organ sites: Secondary | ICD-10-CM

## 2012-03-25 MED ORDER — DEXTROSE 5 % IV SOLN
1.5000 g | INTRAVENOUS | Status: AC
  Administered 2012-03-26: 1.5 g via INTRAVENOUS
  Filled 2012-03-25: qty 1.5

## 2012-03-25 NOTE — Progress Notes (Signed)
Chemo teaching done and consent signed for Adriamycin, Bleomycin, Vinblastine, and Dacarbazine. Pt verbalizes understanding of chemo teaching and had questions for me that she wanted me to address with Dr. Mariel Sleet. Pt in good spirits today and is ready to start chemotherapy on Wednesday 10/16. Meds called in and escribed to [facility].

## 2012-03-25 NOTE — Progress Notes (Signed)
Pt's questions answered regarding ALT/ alkaline phosphatase levels being slightly increased, risk of long term cancers from having Hodgkin's/chemo, website for ordering MedAlert Bracelets to alert Medical Staff that pt has received Bleomycin, rx for Niferex, rx for Acyclovir since pt has hx of fever blisters, and that we will remove port before XRT. Pt verbalized understanding of all instructions.

## 2012-03-25 NOTE — Addendum Note (Signed)
Addended by: Oda Kilts on: 03/25/2012 01:55 PM   Modules accepted: Orders

## 2012-03-25 NOTE — Progress Notes (Addendum)
Pt to have monthly CMETs, LDH, ESR, & CBC diff's. Biweekly CBC diffs. PFTs and 2D echo after cycle 3 and 5

## 2012-03-26 ENCOUNTER — Encounter (HOSPITAL_COMMUNITY): Payer: Self-pay | Admitting: Certified Registered Nurse Anesthetist

## 2012-03-26 ENCOUNTER — Ambulatory Visit (HOSPITAL_COMMUNITY): Payer: BC Managed Care – PPO

## 2012-03-26 ENCOUNTER — Encounter (HOSPITAL_COMMUNITY): Payer: Self-pay | Admitting: *Deleted

## 2012-03-26 ENCOUNTER — Inpatient Hospital Stay (HOSPITAL_COMMUNITY): Payer: BC Managed Care – PPO

## 2012-03-26 ENCOUNTER — Ambulatory Visit: Payer: BC Managed Care – PPO

## 2012-03-26 ENCOUNTER — Encounter (HOSPITAL_COMMUNITY)
Admission: RE | Disposition: A | Discharge status: Home / Self Care | Payer: Self-pay | Source: Ambulatory Visit | Attending: Thoracic Surgery (Cardiothoracic Vascular Surgery)

## 2012-03-26 ENCOUNTER — Ambulatory Visit (HOSPITAL_COMMUNITY)
Admission: RE | Admit: 2012-03-26 | Discharge: 2012-03-26 | Disposition: A | Discharge status: Home / Self Care | Payer: BC Managed Care – PPO | Source: Ambulatory Visit | Attending: Thoracic Surgery (Cardiothoracic Vascular Surgery) | Admitting: Thoracic Surgery (Cardiothoracic Vascular Surgery)

## 2012-03-26 ENCOUNTER — Ambulatory Visit: Payer: BC Managed Care – PPO | Admitting: Radiation Oncology

## 2012-03-26 ENCOUNTER — Ambulatory Visit (HOSPITAL_COMMUNITY): Payer: BC Managed Care – PPO | Admitting: Certified Registered Nurse Anesthetist

## 2012-03-26 DIAGNOSIS — I10 Essential (primary) hypertension: Secondary | ICD-10-CM | POA: Insufficient documentation

## 2012-03-26 DIAGNOSIS — C8190 Hodgkin lymphoma, unspecified, unspecified site: Secondary | ICD-10-CM

## 2012-03-26 DIAGNOSIS — Z79899 Other long term (current) drug therapy: Secondary | ICD-10-CM | POA: Insufficient documentation

## 2012-03-26 DIAGNOSIS — F329 Major depressive disorder, single episode, unspecified: Secondary | ICD-10-CM | POA: Insufficient documentation

## 2012-03-26 DIAGNOSIS — Z01812 Encounter for preprocedural laboratory examination: Secondary | ICD-10-CM | POA: Insufficient documentation

## 2012-03-26 DIAGNOSIS — F3289 Other specified depressive episodes: Secondary | ICD-10-CM | POA: Insufficient documentation

## 2012-03-26 HISTORY — PX: PORTACATH PLACEMENT: SHX2246

## 2012-03-26 SURGERY — INSERTION, TUNNELED CENTRAL VENOUS DEVICE, WITH PORT
Anesthesia: Monitor Anesthesia Care | Wound class: Clean

## 2012-03-26 MED ORDER — HYDROMORPHONE HCL PF 1 MG/ML IJ SOLN: 0.2500 mg | INTRAMUSCULAR | Status: DC | PRN

## 2012-03-26 MED ORDER — ONDANSETRON HCL 4 MG/2ML IJ SOLN: 4.0000 mg | Freq: Once | INTRAMUSCULAR | Status: DC | PRN

## 2012-03-26 MED ORDER — SODIUM CHLORIDE 0.9 % IJ SOLN: 3.0000 mL | Freq: Two times a day (BID) | INTRAMUSCULAR | Status: DC

## 2012-03-26 MED ORDER — LIDOCAINE HCL (PF) 1 % IJ SOLN
INTRAMUSCULAR | Status: AC
  Filled 2012-03-26: qty 30

## 2012-03-26 MED ORDER — MIDAZOLAM HCL 5 MG/5ML IJ SOLN
INTRAMUSCULAR | Status: DC | PRN
  Administered 2012-03-26 (×2): 1 mg via INTRAVENOUS

## 2012-03-26 MED ORDER — ONDANSETRON HCL 4 MG/2ML IJ SOLN: 4.0000 mg | Freq: Four times a day (QID) | INTRAMUSCULAR | Status: DC | PRN

## 2012-03-26 MED ORDER — FENTANYL CITRATE 0.05 MG/ML IJ SOLN
INTRAMUSCULAR | Status: DC | PRN
  Administered 2012-03-26 (×2): 50 ug via INTRAVENOUS
  Administered 2012-03-26 (×2): 25 ug via INTRAVENOUS

## 2012-03-26 MED ORDER — PROPOFOL 10 MG/ML IV EMUL
INTRAVENOUS | Status: DC | PRN
  Administered 2012-03-26: 50 ug/kg/min via INTRAVENOUS

## 2012-03-26 MED ORDER — LACTATED RINGERS IV SOLN
INTRAVENOUS | Status: DC | PRN
  Administered 2012-03-26: 08:00:00 via INTRAVENOUS

## 2012-03-26 MED ORDER — OXYCODONE HCL 5 MG PO TABS: 5.0000 mg | ORAL_TABLET | Freq: Once | ORAL | Status: DC | PRN

## 2012-03-26 MED ORDER — 0.9 % SODIUM CHLORIDE (POUR BTL) OPTIME
TOPICAL | Status: DC | PRN
  Administered 2012-03-26: 1000 mL

## 2012-03-26 MED ORDER — SODIUM CHLORIDE 0.9 % IR SOLN
Status: DC | PRN
  Administered 2012-03-26: 08:00:00

## 2012-03-26 MED ORDER — SODIUM CHLORIDE 0.9 % IV SOLN: 250.0000 mL | INTRAVENOUS | Status: DC | PRN

## 2012-03-26 MED ORDER — ONDANSETRON HCL 4 MG/2ML IJ SOLN
INTRAMUSCULAR | Status: DC | PRN
  Administered 2012-03-26: 4 mg via INTRAVENOUS

## 2012-03-26 MED ORDER — LIDOCAINE HCL (PF) 1 % IJ SOLN
INTRAMUSCULAR | Status: DC | PRN
  Administered 2012-03-26: 30 mL

## 2012-03-26 MED ORDER — OXYCODONE HCL 5 MG/5ML PO SOLN
5.0000 mg | Freq: Once | ORAL | Status: DC | PRN
Start: 2012-03-26 — End: 2012-03-26

## 2012-03-26 MED ORDER — ACETAMINOPHEN 650 MG RE SUPP: 650.0000 mg | RECTAL | Status: DC | PRN

## 2012-03-26 MED ORDER — SODIUM CHLORIDE 0.9 % IJ SOLN: 3.0000 mL | INTRAMUSCULAR | Status: DC | PRN

## 2012-03-26 MED ORDER — TRAMADOL HCL 50 MG PO TABS: 50.0000 mg | ORAL_TABLET | Freq: Four times a day (QID) | ORAL | Status: DC | PRN

## 2012-03-26 MED ORDER — MEPERIDINE HCL 25 MG/ML IJ SOLN: 6.2500 mg | INTRAMUSCULAR | Status: DC | PRN

## 2012-03-26 MED ORDER — LIDOCAINE-EPINEPHRINE (PF) 1 %-1:200000 IJ SOLN
INTRAMUSCULAR | Status: AC
  Filled 2012-03-26: qty 10

## 2012-03-26 MED ORDER — PROPOFOL 10 MG/ML IV BOLUS
INTRAVENOUS | Status: DC | PRN
  Administered 2012-03-26 (×4): 20 mg via INTRAVENOUS

## 2012-03-26 MED ORDER — ACETAMINOPHEN 325 MG PO TABS: 650.0000 mg | ORAL_TABLET | ORAL | Status: DC | PRN

## 2012-03-26 SURGICAL SUPPLY — 40 items
ADH SKN CLS APL DERMABOND .7 (GAUZE/BANDAGES/DRESSINGS) ×1
BAG DECANTER FOR FLEXI CONT (MISCELLANEOUS) ×2 IMPLANT
CANISTER SUCTION 2500CC (MISCELLANEOUS) ×2 IMPLANT
CLOTH BEACON ORANGE TIMEOUT ST (SAFETY) ×2 IMPLANT
COVER SURGICAL LIGHT HANDLE (MISCELLANEOUS) ×4 IMPLANT
DERMABOND ADVANCED (GAUZE/BANDAGES/DRESSINGS) ×1
DERMABOND ADVANCED .7 DNX12 (GAUZE/BANDAGES/DRESSINGS) ×1 IMPLANT
DRAPE C-ARM 42X72 X-RAY (DRAPES) ×2 IMPLANT
DRAPE CHEST BREAST 15X10 FENES (DRAPES) ×2 IMPLANT
ELECT CAUTERY BLADE 6.4 (BLADE) ×2 IMPLANT
ELECT REM PT RETURN 9FT ADLT (ELECTROSURGICAL) ×2
ELECTRODE REM PT RTRN 9FT ADLT (ELECTROSURGICAL) ×1 IMPLANT
GLOVE BIOGEL PI IND STRL 6.5 (GLOVE) IMPLANT
GLOVE BIOGEL PI INDICATOR 6.5 (GLOVE) ×2
GLOVE EUDERMIC 7 POWDERFREE (GLOVE) ×2 IMPLANT
GOWN PREVENTION PLUS XLARGE (GOWN DISPOSABLE) ×3 IMPLANT
GOWN STRL NON-REIN LRG LVL3 (GOWN DISPOSABLE) ×1 IMPLANT
GUIDEWIRE UNCOATED ST S 7038 (WIRE) IMPLANT
INTRODUCER 13FR (MISCELLANEOUS) IMPLANT
INTRODUCER COOK 11FR (CATHETERS) IMPLANT
KIT BASIN OR (CUSTOM PROCEDURE TRAY) ×2 IMPLANT
KIT PORT POWER 9.6FR MRI PREA (Catheter) ×1 IMPLANT
KIT PORT POWER ISP 8FR (Catheter) IMPLANT
KIT POWER CATH 8FR (Catheter) IMPLANT
KIT ROOM TURNOVER OR (KITS) ×2 IMPLANT
NEEDLE 22X1 1/2 (OR ONLY) (NEEDLE) ×2 IMPLANT
NS IRRIG 1000ML POUR BTL (IV SOLUTION) ×2 IMPLANT
PACK GENERAL/GYN (CUSTOM PROCEDURE TRAY) ×2 IMPLANT
PAD ARMBOARD 7.5X6 YLW CONV (MISCELLANEOUS) ×4 IMPLANT
SET SHEATH INTRODUCER 10FR (MISCELLANEOUS) IMPLANT
SPONGE GAUZE 4X4 12PLY (GAUZE/BANDAGES/DRESSINGS) ×1 IMPLANT
SUT MNCRL AB 4-0 PS2 18 (SUTURE) ×2 IMPLANT
SUT SILK 2 0 SH (SUTURE) ×1 IMPLANT
SUT VIC AB 3-0 SH 27 (SUTURE) ×4
SUT VIC AB 3-0 SH 27X BRD (SUTURE) ×1 IMPLANT
SYR 20CC LL (SYRINGE) ×2 IMPLANT
SYR CONTROL 10ML LL (SYRINGE) ×2 IMPLANT
TOWEL OR 17X24 6PK STRL BLUE (TOWEL DISPOSABLE) ×2 IMPLANT
TOWEL OR 17X26 10 PK STRL BLUE (TOWEL DISPOSABLE) ×2 IMPLANT
WATER STERILE IRR 1000ML POUR (IV SOLUTION) ×1 IMPLANT

## 2012-03-26 NOTE — Transfer of Care (Signed)
Immediate Anesthesia Transfer of Care Note  Patient: [name] [name]  Procedure(s) Performed: Procedure(s) (LRB) with comments: INSERTION PORT-A-CATH (N/A)  Patient Location: PACU  Anesthesia Type: MAC  Level of Consciousness: awake, alert  and oriented  Airway & Oxygen Therapy: Patient Spontanous Breathing  Post-op Assessment: Report given to PACU RN and Post -op Vital signs reviewed and stable  Post vital signs: Reviewed and stable  Complications: No apparent anesthesia complications

## 2012-03-26 NOTE — Preoperative (Signed)
Beta Blockers   Reason not to administer Beta Blockers:Not Applicable 

## 2012-03-26 NOTE — Op Note (Signed)
NAMEMARCY, [name] NO.:  [id number]  MEDICAL RECORD NO.:  [id number]  LOCATION:  [facility]                         FACILITY:  [facility]  PHYSICIAN:  Salvatore Decent. Dorris Fetch, M.D.DATE OF BIRTH:  [birth year]  DATE OF PROCEDURE:  03/26/2012 DATE OF DISCHARGE:  03/26/2012                              OPERATIVE REPORT   PREOPERATIVE DIAGNOSIS:  Hodgkin disease, needs IV access for chemotherapy.  POSTOPERATIVE DIAGNOSIS:  Hodgkin disease, needs IV access for chemotherapy.  PROCEDURE:  Port-A-Cath placement, left subclavian vein.  SURGEON:  Salvatore Decent. Dorris Fetch, MD  ANESTHESIA:  Local with intravenous sedation.  FINDINGS:  Vein accessed on second pass, wire passed easily.  Catheter tip in position in the superior vena cava, good return, flushed easily.  CLINICAL NOTE:  [name] [name] is a 41 year old woman who recently diagnosed with Hodgkins disease.  She needs intravenous access for chemotherapy.  She was advised to have a Port-A-Cath placement.  The indications, risks, benefits, and alternatives were discussed in detail with the patient.  She understood and accepted the risks and agreed to proceed.  OPERATIVE NOTE:  Ms. Raynor was brought to the preoperative holding area on March 26, 2012, there Anesthesia established intravenous access.  She was taken to the operating room and given intravenous sedation under the monitoring of the Anesthesia Service.  She was given intravenous antibiotics.  The chest was prepped and draped in the usual sterile fashion.  Lidocaine 1% was used for local anesthesia, a total of 20 mL was utilized in all.  The site for subclavian access was Anesthetized. With the patient in Trendelenburg position, the left subclavian vein was accessed on the second pass, there was good blood return.  The wire passed easily and was confirmed in the right atrium fluoroscopically.  The skin was anesthetized as well as the area for the pocket beneath the  subcutaneous tissue.  An incision was made incorporating the wire.  A pocket was created.  Meticulous hemostasis was achieved.  A peel-away sheath introducer was passed over the wire. The 9.5-French preattached Port-A-Cath was pre-flushed with heparinized saline and cut to appropriate length.  The catheter then was passed through the peel-away sheath introducer, which was removed.  Fluoroscopy showed the tip of the catheter in the SVC, but due to the tumor mass, the tip of the catheter did not point downward, but instead was directed more laterally to the right.  Attempts to reposition of the catheter did not change the position of the catheter tip.  The port did allow easy drawback of blood and flushed easily as well.  The port was secured in place with 3-0 Vicryl sutures to the pectoralis fascia.  The subcutaneous tissue then was closed with a running 3-0 Vicryl suture, and the skin was closed with a 4-0 Vicryl subcuticular suture.  Dermabond was applied to the incision.  The patient was taken from the operating room to the postanesthetic care unit in good condition.  She tolerated the procedure well.  All sponge, needle, and instrument counts were correct at the end of the procedure.     Salvatore Decent Dorris Fetch, M.D.     SCH/MEDQ  D:  03/26/2012  T:  03/26/2012  Job:  [id number]

## 2012-03-26 NOTE — H&P (View-Only) (Signed)
PCP is HALL,ZACK, MD Referring Provider is Hall, Zack, MD  Chief Complaint  Patient presents with  . Mediastinal Mass    anterior...eval and treat    HPI: [name] [name] is a 41-year-old woman who presents with a chief complaint of chest discomfort and shortness of breath with exertion.  She says her symptoms date back about 2-1/2-3 years. She previously was very active. She began having episodes when she would exert herself that caused her to feel like her heart was "beating out of her chest" she. She also would have a feeling that she couldn't get a full inspiration and that he felt like "there was air at the top of the lungs but none at the bottom". This has progressively gotten worse over time. She had been seen on several occasions, but no underlying cause was discerned. She has had to stop many activities that she formally engaged in such as hiking and tennis. She's not had any fevers or chills but does have occasional night sweats.  She recently saw Dr. Hall. In reviewing her records she found a chest x-ray from April of 2012 which showed an abnormal mediastinal contour. He ordered a CT of the chest which showed a large anterior mediastinal mass surrounding and encasing the great vessels.  Past Medical History  Diagnosis Date  . Fibromyalgia   . Eczema   . Hypertension   . Dyspnea   . Depression   . Restless leg     No past surgical history on file.  No family history on file.  Social History History  Substance Use Topics  . Smoking status: Never Smoker   . Smokeless tobacco: Never Used  . Alcohol Use: No    Current Outpatient Prescriptions  Medication Sig Dispense Refill  . desvenlafaxine (PRISTIQ) 50 MG 24 hr tablet Take 50 mg by mouth daily.      . gabapentin (NEURONTIN) 600 MG tablet Take 300 mg by mouth at bedtime. One or two hs      . lisinopril-hydrochlorothiazide (PRINZIDE,ZESTORETIC) 20-12.5 MG per tablet Take 1 tablet by mouth daily.      . Multiple  Minerals-Vitamins (CALCIUM & VIT D3 BONE HEALTH PO) Take by mouth.      . norethindrone-ethinyl estradiol (JUNEL FE,GILDESS FE,LOESTRIN FE) 1-20 MG-MCG tablet Take 1 tablet by mouth daily.      . tiZANidine (ZANAFLEX) 4 MG capsule Take 4 mg by mouth at bedtime.      . vitamin B-12 (CYANOCOBALAMIN) 1000 MCG tablet Take 1,000 mcg by mouth daily.      . vitamin C (ASCORBIC ACID) 500 MG tablet Take 500 mg by mouth daily.        Allergies  Allergen Reactions  . Sulfur Anaphylaxis    Review of Systems  Constitutional: Positive for activity change and fatigue. Negative for fever, chills and unexpected weight change.  Respiratory: Positive for chest tightness and shortness of breath.   Cardiovascular: Positive for chest pain.  Genitourinary: Negative.   Musculoskeletal:       Muscle pain, restless leg syndrome  Neurological: Negative for dizziness, seizures and light-headedness.  Hematological: Does not bruise/bleed easily.  Psychiatric/Behavioral: Negative.   All other systems reviewed and are negative.    BP 143/90  Pulse 99  Resp 16  Ht 5' 6" (1.676 m)  Wt 195 lb (88.451 kg)  BMI 31.47 kg/m2  SpO2 96% Physical Exam  Vitals reviewed. Constitutional: She is oriented to person, place, and time. She appears well-developed and well-nourished. No distress.    HENT:  Head: Normocephalic and atraumatic.  Eyes: EOM are normal. Pupils are equal, round, and reactive to light.  Neck: Neck supple. No thyromegaly present.  Cardiovascular: Normal rate and regular rhythm.   Murmur (2/6 systolic) heard. Pulmonary/Chest: Effort normal and breath sounds normal. She has no wheezes. She has no rales.  Abdominal: Soft. There is no tenderness.  Musculoskeletal: She exhibits no edema.  Lymphadenopathy:    She has cervical adenopathy (questionable lower right cervical node).  Neurological: She is alert and oriented to person, place, and time. No cranial nerve deficit.  Skin: Skin is warm and dry.      Diagnostic Tests: CT Chest 02/26/12 Technique: Multidetector CT imaging of the chest was performed  following the standard protocol during bolus administration of  intravenous contrast.  Contrast: 80mL OMNIPAQUE IOHEXOL 300 MG/ML SOLN  Comparison: None.  Findings:  There is no enlarged supraclavicular or axillary adenopathy. Large  anterior mediastinal mass is identified measuring 8.30 x 12.1 x 6.7  cm. This mass is encasing the great vessels. There is significant  narrowing of the left subclavian vein. There is also encasement and  narrowing of the left common carotid artery. There is no hilar  adenopathy. No pericardial or pleural effusion.  Lungs are clear. No pulmonary nodule or pulmonary mass identified.  Limited imaging through the upper abdomen is unremarkable.  IMPRESSION:  1. Large anterior mediastinal mass is identified and is worrisome  for tumor. This is encasing the great vessels and S causing  significant narrowing of the left subclavian vein. Recommend  further evaluation with PET CT and tissue sampling.   Impression: 41-year-old woman with newly discovered anterior mediastinal mass. I went to great detail with patient and her husband regarding the CT findings and reviewed the films with them. We talked about the differential diagnosis. They understand that different diagnosis includes lymphoma, thymic tumors, germ cell tumors and thyroid. They understand this lesion is more likely to be malignant than benign. Given the appearance of her lesion I think lymphoma is the most likely diagnosis. Malignant germ cell tumors are much less common in women, but not unheard of..  We need to establish a definitive diagnosis. I will order germ cell tumor markers. We will go ahead and schedule her for a left anterior mediastinotomy (Chamberlain procedure) pending the results of the blood tests. She will also need a PET/CT but I do not want to delay the biopsy.  I discussed the  indications, risks, benefits, and alternatives for a left anterior mediastinotomy. They understand this is far more likely to yield a definitive diagnosis than attempted needle aspiration. They understand the general nature of the procedure, the incision to be used, and the plan for general anesthesia. They also understand that this is a diagnostic and not a therapeutic procedure. They understand the risks include but are not limited to those associated with general anesthesia, bleeding, infection, as well as the extremely unlikely possibilities of MI, DVT, PE. She accepts these risks and wishes to proceed.   Plan: 1. Alpha-fetoprotein and beta hCG levels 2. Schedule PET/CT 3. Left anterior mediastinotomy under general anesthesia on Tuesday, September 24. We will plan to do this as an outpatient procedure. 

## 2012-03-26 NOTE — Interval H&P Note (Signed)
History and Physical Interval Note:  03/26/2012 7:29 AM  [name] [name]  has presented today for surgery, with the diagnosis of IIA Hodgkin's disease   The various methods of treatment have been discussed with the patient and family. After consideration of risks, benefits and other options for treatment, the patient has consented to  Procedure(s) (LRB) with comments: INSERTION PORT-A-CATH (Bilateral) as a surgical intervention .  The patient's history has been reviewed, patient examined, no change in status, stable for surgery.  I have reviewed the patient's chart and labs.  Questions were answered to the patient's satisfaction.     HENDRICKSON,STEVEN C  Hodgkin's disease. Needs IV access for chemotherapy. Plan Portacath. She is aware of the risks of pneumothorax, bleeding, catheter malpositition and thrombosis.

## 2012-03-26 NOTE — Brief Op Note (Signed)
03/26/2012  8:33 AM  PATIENT:  [name] [name]  41 y.o. female  PRE-OPERATIVE DIAGNOSIS:  IIA Hodgkin's disease, needs IV access for chemotherapy  POST-OPERATIVE DIAGNOSIS:  IIA Hodgkin's disease   PROCEDURE:  Procedure(s) (LRB) with comments: INSERTION PORT-A-CATH (N/A) left subclavian vein  SURGEON:  Surgeon(s) and Role:    * Loreli Slot, MD - Primary  ANESTHESIA:   local and IV sedation  EBL:  Total I/O In: 600 [I.V.:600] Out: -   LOCAL MEDICATIONS USED:  LIDOCAINE  and Amount: 20 ml  SPECIMEN:  No Specimen  COUNTS:  YES  DICTATION: .Other Dictation: Dictation Number [id number]  PLAN OF CARE: Discharge to home after PACU  PATIENT DISPOSITION:  PACU - hemodynamically stable.   Delay start of Pharmacological VTE agent (>24hrs) due to surgical blood loss or risk of bleeding: not applicable

## 2012-03-26 NOTE — Anesthesia Preprocedure Evaluation (Addendum)
Anesthesia Evaluation  Patient identified by MRN, date of birth, ID band Patient awake    Reviewed: Allergy & Precautions, H&P , NPO status , Patient's Chart, lab work & pertinent test results  History of Anesthesia Complications Negative for: history of anesthetic complications  Airway Mallampati: I TM Distance: >3 FB Neck ROM: Full    Dental  (+) Teeth Intact and Dental Advisory Given   Pulmonary shortness of breath and with exertion, sleep apnea ,    Pulmonary exam normal       Cardiovascular hypertension, Pt. on medications Rhythm:Regular Rate:Normal     Neuro/Psych Depression Restless leg syndrome- on gabapentin  Neuromuscular disease    GI/Hepatic negative GI ROS, Neg liver ROS,   Endo/Other  negative endocrine ROS  Renal/GU negative Renal ROS     Musculoskeletal  (+) Fibromyalgia -  Abdominal Normal abdominal exam  (+)   Peds  Hematology negative hematology ROS (+)   Anesthesia Other Findings   Reproductive/Obstetrics                          Anesthesia Physical Anesthesia Plan  ASA: III  Anesthesia Plan: MAC   Post-op Pain Management:    Induction: Intravenous  Airway Management Planned: Simple Face Mask  Additional Equipment:   Intra-op Plan:   Post-operative Plan:   Informed Consent: I have reviewed the patients History and Physical, chart, labs and discussed the procedure including the risks, benefits and alternatives for the proposed anesthesia with the patient or authorized representative who has indicated his/her understanding and acceptance.   Dental advisory given  Plan Discussed with: CRNA and Surgeon  Anesthesia Plan Comments:        Anesthesia Quick Evaluation

## 2012-03-26 NOTE — Anesthesia Postprocedure Evaluation (Signed)
Anesthesia Post Note  Patient: [name] [name]  Procedure(s) Performed: Procedure(s) (LRB): INSERTION PORT-A-CATH (N/A)  Anesthesia type: general  Patient location: PACU  Post pain: Pain level controlled  Post assessment: Patient's Cardiovascular Status Stable  Last Vitals:  Filed Vitals:   03/26/12 0850  BP:   Pulse: 74  Temp:   Resp: 20    Post vital signs: Reviewed and stable  Level of consciousness: sedated  Complications: No apparent anesthesia complications

## 2012-03-27 ENCOUNTER — Encounter (HOSPITAL_BASED_OUTPATIENT_CLINIC_OR_DEPARTMENT_OTHER): Payer: BC Managed Care – PPO

## 2012-03-27 ENCOUNTER — Encounter (HOSPITAL_COMMUNITY): Payer: Self-pay | Admitting: Thoracic Surgery (Cardiothoracic Vascular Surgery)

## 2012-03-27 VITALS — BP 142/80 | HR 83 | Temp 98.7°F | Resp 18 | Wt 197.3 lb

## 2012-03-27 DIAGNOSIS — C8119 Nodular sclerosis classical Hodgkin lymphoma, extranodal and solid organ sites: Secondary | ICD-10-CM

## 2012-03-27 DIAGNOSIS — Z5111 Encounter for antineoplastic chemotherapy: Secondary | ICD-10-CM

## 2012-03-27 DIAGNOSIS — C8190 Hodgkin lymphoma, unspecified, unspecified site: Secondary | ICD-10-CM

## 2012-03-27 LAB — COMPREHENSIVE METABOLIC PANEL
ALT: 24 U/L (ref 0–35)
AST: 19 U/L (ref 0–37)
Albumin: 3.4 g/dL — ABNORMAL LOW (ref 3.5–5.2)
Alkaline Phosphatase: 112 U/L (ref 39–117)
BUN: 11 mg/dL (ref 6–23)
CO2: 26 mEq/L (ref 19–32)
Calcium: 9.7 mg/dL (ref 8.4–10.5)
Chloride: 99 mEq/L (ref 96–112)
Creatinine, Ser: 0.73 mg/dL (ref 0.50–1.10)
GFR calc Af Amer: >90 mL/min (ref >90)
GFR calc non Af Amer: >90 mL/min (ref >90)
Glucose, Bld: 87 mg/dL (ref 70–99)
Potassium: 3.8 mEq/L (ref 3.5–5.1)
Sodium: 134 mEq/L — ABNORMAL LOW (ref 135–145)
Total Bilirubin: 0.4 mg/dL (ref 0.3–1.2)
Total Protein: 7 g/dL (ref 6.0–8.3)

## 2012-03-27 LAB — CBC WITH DIFFERENTIAL/PLATELET
Basophils Absolute: 0 10*3/uL (ref 0.0–0.1)
Basophils Relative: 0 % (ref 0–1)
Eosinophils Absolute: 0.2 10*3/uL (ref 0.0–0.7)
Eosinophils Relative: 3 % (ref 0–5)
HCT: 34.2 % — ABNORMAL LOW (ref 36.0–46.0)
Hemoglobin: 11.9 g/dL — ABNORMAL LOW (ref 12.0–15.0)
Lymphocytes Relative: 18 % (ref 12–46)
Lymphs Abs: 1.2 10*3/uL (ref 0.7–4.0)
MCH: 30.6 pg (ref 26.0–34.0)
MCHC: 34.8 g/dL (ref 30.0–36.0)
MCV: 87.9 fL (ref 78.0–100.0)
Monocytes Absolute: 0.5 10*3/uL (ref 0.1–1.0)
Monocytes Relative: 8 % (ref 3–12)
Neutro Abs: 4.7 10*3/uL (ref 1.7–7.7)
Neutrophils Relative %: 71 % (ref 43–77)
Platelets: 258 10*3/uL (ref 150–400)
RBC: 3.89 MIL/uL (ref 3.87–5.11)
RDW: 12.6 % (ref 11.5–15.5)
WBC: 6.6 10*3/uL (ref 4.0–10.5)

## 2012-03-27 LAB — SEDIMENTATION RATE: Sed Rate: 10 mm/hr (ref 0–22)

## 2012-03-27 LAB — LACTATE DEHYDROGENASE: LDH: 193 U/L (ref 94–250)

## 2012-03-27 MED ORDER — HEPARIN SOD (PORK) LOCK FLUSH 100 UNIT/ML IV SOLN
INTRAVENOUS | Status: AC
  Filled 2012-03-27: qty 5

## 2012-03-27 MED ORDER — ONDANSETRON HCL 40 MG/20ML IJ SOLN: 16.0000 mg | Freq: Once | INTRAMUSCULAR | Status: DC

## 2012-03-27 MED ORDER — SODIUM CHLORIDE 0.9 % IV SOLN
19.0000 [IU] | Freq: Once | INTRAVENOUS | Status: AC
  Administered 2012-03-27: 19 [IU] via INTRAVENOUS
  Filled 2012-03-27: qty 6.3

## 2012-03-27 MED ORDER — SODIUM CHLORIDE 0.9 % IV SOLN: 10.0000 [IU]/m2 | Freq: Once | INTRAVENOUS | Status: DC

## 2012-03-27 MED ORDER — DOXORUBICIN HCL CHEMO IV INJECTION 2 MG/ML
25.0000 mg/m2 | Freq: Once | INTRAVENOUS | Status: AC
  Administered 2012-03-27: 50 mg via INTRAVENOUS
  Filled 2012-03-27: qty 25

## 2012-03-27 MED ORDER — DEXAMETHASONE SODIUM PHOSPHATE 10 MG/ML IJ SOLN: 20.0000 mg | Freq: Once | INTRAMUSCULAR | Status: DC

## 2012-03-27 MED ORDER — SODIUM CHLORIDE 0.9 % IJ SOLN
10.0000 mL | INTRAMUSCULAR | Status: DC | PRN
  Filled 2012-03-27: qty 10

## 2012-03-27 MED ORDER — VINBLASTINE SULFATE CHEMO INJECTION 1 MG/ML
6.0000 mg/m2 | Freq: Once | INTRAVENOUS | Status: AC
  Administered 2012-03-27: 12.2 mg via INTRAVENOUS
  Filled 2012-03-27: qty 12.2

## 2012-03-27 MED ORDER — SODIUM CHLORIDE 0.9 % IV SOLN
375.0000 mg/m2 | Freq: Once | INTRAVENOUS | Status: AC
  Administered 2012-03-27: 760 mg via INTRAVENOUS
  Filled 2012-03-27: qty 38

## 2012-03-27 MED ORDER — HEPARIN SOD (PORK) LOCK FLUSH 100 UNIT/ML IV SOLN
500.0000 [IU] | Freq: Once | INTRAVENOUS | Status: AC | PRN
  Administered 2012-03-27: 500 [IU]
  Filled 2012-03-27: qty 5

## 2012-03-27 MED ORDER — SODIUM CHLORIDE 0.9 % IV SOLN: Freq: Once | INTRAVENOUS | Status: DC

## 2012-03-27 MED ORDER — SODIUM CHLORIDE 0.9 % IV SOLN
Freq: Once | INTRAVENOUS | Status: AC
  Administered 2012-03-27: 16 mg via INTRAVENOUS
  Filled 2012-03-27: qty 8

## 2012-03-27 MED ORDER — SODIUM CHLORIDE 0.9 % IV SOLN
1.0000 [IU] | Freq: Once | INTRAVENOUS | Status: AC
  Administered 2012-03-27: 1 [IU] via INTRAVENOUS
  Filled 2012-03-27: qty 0.3

## 2012-03-27 NOTE — Progress Notes (Signed)
Tolerated chemo well. 

## 2012-03-28 ENCOUNTER — Telehealth (HOSPITAL_COMMUNITY): Payer: Self-pay | Admitting: *Deleted

## 2012-03-28 NOTE — Telephone Encounter (Signed)
24 hour follow up post chemo: Pt did not sleep good last pm. I reminded pt that she had dexamethasone through her iv yesterday before chemo. She had forgotten that we gave her a steroid through her IV. I told her to try taking an Ativan tonight when she went to bed. She said she would try that. Patient otherwise doing good today with no noticeable side effects from chemo yet. Patient instructed to call me for anything. She verbalized understanding.

## 2012-03-29 NOTE — Addendum Note (Signed)
Encounter addended by: Lowella Petties, RN on: 03/29/2012 12:10 PM<BR>     Documentation filed: Charges VN

## 2012-04-03 ENCOUNTER — Telehealth (HOSPITAL_COMMUNITY): Payer: Self-pay

## 2012-04-03 ENCOUNTER — Encounter: Payer: Self-pay | Admitting: Oncology

## 2012-04-03 ENCOUNTER — Other Ambulatory Visit (HOSPITAL_COMMUNITY): Payer: Self-pay | Admitting: Oncology

## 2012-04-03 DIAGNOSIS — M549 Dorsalgia, unspecified: Secondary | ICD-10-CM

## 2012-04-03 MED ORDER — OXYCODONE-ACETAMINOPHEN 5-325 MG PO TABS: ORAL_TABLET | ORAL | Status: DC

## 2012-04-03 NOTE — Telephone Encounter (Signed)
Call from patient with complaints of back pain especially at night that is not relieved by ibuprofen or tramadol.  Has not been able to sleep due to the discomfort.  Discussed with MD and prescription for Percocet 5/325 - can take 1 or 2 every 6 hours as needed for discomfort.  Patient notified and she will come by to pick up the prescription.

## 2012-04-05 ENCOUNTER — Telehealth (HOSPITAL_COMMUNITY): Payer: Self-pay

## 2012-04-05 NOTE — Telephone Encounter (Signed)
Hydroxyzine is Atarax and I do not see how this medication would confer a benefit for dizziness or swimmy headedness.  If she has it, she is welcome to try the medication as the Rx recommends.  Please document the directions of the bottle she has if she has some.  If she does not have any, she may reach out to the prescribing physician, but I cannot prescribe it for those complaints.  She should follow-up with PCP for these symptoms.

## 2012-04-05 NOTE — Telephone Encounter (Signed)
Message from patient with complaints of feeling dizzy and some "swimmy headedness". States "I've had this before and my doctor gave me Hydroxyzine hydrochloride for it and I wanted to know if it was ok to take it?"

## 2012-04-08 NOTE — Telephone Encounter (Signed)
04/05/12 0856 - Spoke with patient.  She did not take the hydroxyzine but is feeling better today. / S. Mercy Riding, RN      [place], [place] 04/05/2012 11:50 AM Signed  Hydroxyzine is Atarax and I do not see how this medication would confer a benefit for dizziness or swimmy headedness. If she has it, she is welcome to try the medication as the Rx recommends. Please document the directions of the bottle she has if she has some. If she does not have any, she may reach out to the prescribing physician, but I cannot prescribe it for those complaints. She should follow-up with PCP for these symptoms.  Evelena Leyden, RN 04/05/2012 11:15 AM Signed  Message from patient with complaints of feeling dizzy and some "swimmy headedness". States "I've had this before and my doctor gave me Hydroxyzine hydrochloride for it and I wanted to know if it was ok to take it?"

## 2012-04-10 ENCOUNTER — Encounter (HOSPITAL_COMMUNITY): Payer: BC Managed Care – PPO

## 2012-04-10 DIAGNOSIS — C8190 Hodgkin lymphoma, unspecified, unspecified site: Secondary | ICD-10-CM

## 2012-04-10 LAB — COMPREHENSIVE METABOLIC PANEL
ALT: 16 U/L (ref 0–35)
AST: 14 U/L (ref 0–37)
Albumin: 3.2 g/dL — ABNORMAL LOW (ref 3.5–5.2)
Alkaline Phosphatase: 73 U/L (ref 39–117)
BUN: 12 mg/dL (ref 6–23)
CO2: 26 mEq/L (ref 19–32)
Calcium: 9.4 mg/dL (ref 8.4–10.5)
Chloride: 101 mEq/L (ref 96–112)
Creatinine, Ser: 0.7 mg/dL (ref 0.50–1.10)
GFR calc Af Amer: >90 mL/min (ref >90)
GFR calc non Af Amer: >90 mL/min (ref >90)
Glucose, Bld: 107 mg/dL — ABNORMAL HIGH (ref 70–99)
Potassium: 3.5 mEq/L (ref 3.5–5.1)
Sodium: 137 mEq/L (ref 135–145)
Total Bilirubin: 0.2 mg/dL — ABNORMAL LOW (ref 0.3–1.2)
Total Protein: 6.7 g/dL (ref 6.0–8.3)

## 2012-04-10 LAB — CBC WITH DIFFERENTIAL/PLATELET
Basophils Absolute: 0 10*3/uL (ref 0.0–0.1)
Basophils Relative: 1 % (ref 0–1)
Eosinophils Absolute: 0.1 10*3/uL (ref 0.0–0.7)
Eosinophils Relative: 3 % (ref 0–5)
HCT: 33.7 % — ABNORMAL LOW (ref 36.0–46.0)
Hemoglobin: 11.9 g/dL — ABNORMAL LOW (ref 12.0–15.0)
Lymphocytes Relative: 65 % — ABNORMAL HIGH (ref 12–46)
Lymphs Abs: 1.1 10*3/uL (ref 0.7–4.0)
MCH: 30.7 pg (ref 26.0–34.0)
MCHC: 35.3 g/dL (ref 30.0–36.0)
MCV: 87.1 fL (ref 78.0–100.0)
Monocytes Absolute: 0.4 10*3/uL (ref 0.1–1.0)
Monocytes Relative: 22 % — ABNORMAL HIGH (ref 3–12)
Neutro Abs: 0.2 10*3/uL — ABNORMAL LOW (ref 1.7–7.7)
Neutrophils Relative %: 9 % — ABNORMAL LOW (ref 43–77)
Platelets: 206 10*3/uL (ref 150–400)
RBC: 3.87 MIL/uL (ref 3.87–5.11)
RDW: 12.9 % (ref 11.5–15.5)
WBC: 1.6 10*3/uL — ABNORMAL LOW (ref 4.0–10.5)

## 2012-04-10 LAB — LACTATE DEHYDROGENASE: LDH: 151 U/L (ref 94–250)

## 2012-04-10 LAB — SEDIMENTATION RATE: Sed Rate: 25 mm/hr — ABNORMAL HIGH (ref 0–22)

## 2012-04-10 MED ORDER — SODIUM CHLORIDE 0.9 % IJ SOLN
INTRAMUSCULAR | Status: AC
  Filled 2012-04-10: qty 10

## 2012-04-10 NOTE — Progress Notes (Signed)
Chemo postponed for one week due to low ANC per [place]. Will start neulasta with next cycle.

## 2012-04-17 ENCOUNTER — Encounter (HOSPITAL_COMMUNITY): Payer: BC Managed Care – PPO | Attending: Oncology

## 2012-04-17 ENCOUNTER — Telehealth (HOSPITAL_COMMUNITY): Payer: Self-pay | Admitting: *Deleted

## 2012-04-17 VITALS — BP 148/91 | HR 99 | Temp 98.8°F | Resp 18 | Wt 197.2 lb

## 2012-04-17 DIAGNOSIS — Z5111 Encounter for antineoplastic chemotherapy: Secondary | ICD-10-CM

## 2012-04-17 DIAGNOSIS — C8190 Hodgkin lymphoma, unspecified, unspecified site: Secondary | ICD-10-CM | POA: Insufficient documentation

## 2012-04-17 DIAGNOSIS — C8119 Nodular sclerosis classical Hodgkin lymphoma, extranodal and solid organ sites: Secondary | ICD-10-CM

## 2012-04-17 LAB — CBC WITH DIFFERENTIAL/PLATELET
Basophils Absolute: 0 10*3/uL (ref 0.0–0.1)
Basophils Relative: 0 % (ref 0–1)
Eosinophils Absolute: 0 10*3/uL (ref 0.0–0.7)
Eosinophils Relative: 1 % (ref 0–5)
HCT: 37.3 % (ref 36.0–46.0)
Hemoglobin: 13.3 g/dL (ref 12.0–15.0)
Lymphocytes Relative: 25 % (ref 12–46)
Lymphs Abs: 1.9 10*3/uL (ref 0.7–4.0)
MCH: 31.1 pg (ref 26.0–34.0)
MCHC: 35.7 g/dL (ref 30.0–36.0)
MCV: 87.1 fL (ref 78.0–100.0)
Monocytes Absolute: 0.5 10*3/uL (ref 0.1–1.0)
Monocytes Relative: 7 % (ref 3–12)
Neutro Abs: 5.1 10*3/uL (ref 1.7–7.7)
Neutrophils Relative %: 67 % (ref 43–77)
Platelets: 237 10*3/uL (ref 150–400)
RBC: 4.28 MIL/uL (ref 3.87–5.11)
RDW: 13.3 % (ref 11.5–15.5)
WBC: 7.6 10*3/uL (ref 4.0–10.5)

## 2012-04-17 MED ORDER — DOXORUBICIN HCL CHEMO IV INJECTION 2 MG/ML
25.0000 mg/m2 | Freq: Once | INTRAVENOUS | Status: AC
  Administered 2012-04-17: 50 mg via INTRAVENOUS
  Filled 2012-04-17: qty 25

## 2012-04-17 MED ORDER — HEPARIN SOD (PORK) LOCK FLUSH 100 UNIT/ML IV SOLN
INTRAVENOUS | Status: AC
  Filled 2012-04-17: qty 5

## 2012-04-17 MED ORDER — SODIUM CHLORIDE 0.9 % IV SOLN
10.0000 [IU]/m2 | Freq: Once | INTRAVENOUS | Status: AC
  Administered 2012-04-17: 20 [IU] via INTRAVENOUS
  Filled 2012-04-17: qty 6.7

## 2012-04-17 MED ORDER — SODIUM CHLORIDE 0.9 % IV SOLN
375.0000 mg/m2 | Freq: Once | INTRAVENOUS | Status: AC
  Administered 2012-04-17: 760 mg via INTRAVENOUS
  Filled 2012-04-17: qty 38

## 2012-04-17 MED ORDER — DEXAMETHASONE SODIUM PHOSPHATE 10 MG/ML IJ SOLN: 20.0000 mg | Freq: Once | INTRAMUSCULAR | Status: DC

## 2012-04-17 MED ORDER — SODIUM CHLORIDE 0.9 % IJ SOLN
10.0000 mL | INTRAMUSCULAR | Status: DC | PRN
  Administered 2012-04-17: 10 mL
  Filled 2012-04-17: qty 10

## 2012-04-17 MED ORDER — VINBLASTINE SULFATE CHEMO INJECTION 1 MG/ML
6.0000 mg/m2 | Freq: Once | INTRAVENOUS | Status: AC
  Administered 2012-04-17: 12.2 mg via INTRAVENOUS
  Filled 2012-04-17: qty 12.2

## 2012-04-17 MED ORDER — SODIUM CHLORIDE 0.9 % IV SOLN
Freq: Once | INTRAVENOUS | Status: AC
  Administered 2012-04-17: 10:00:00 via INTRAVENOUS

## 2012-04-17 MED ORDER — SODIUM CHLORIDE 0.9 % IV SOLN: 16.0000 mg | Freq: Once | INTRAVENOUS | Status: DC

## 2012-04-17 MED ORDER — SODIUM CHLORIDE 0.9 % IV SOLN
Freq: Once | INTRAVENOUS | Status: AC
  Administered 2012-04-17: 16 mg via INTRAVENOUS
  Filled 2012-04-17: qty 8

## 2012-04-17 MED ORDER — HEPARIN SOD (PORK) LOCK FLUSH 100 UNIT/ML IV SOLN
500.0000 [IU] | Freq: Once | INTRAVENOUS | Status: AC | PRN
  Administered 2012-04-17: 500 [IU]
  Filled 2012-04-17: qty 5

## 2012-04-17 NOTE — Telephone Encounter (Signed)
Pt states she has tried ativan and tylenol pm but can only sleep for about 3 hrs at a time. She is requesting something else for sleep

## 2012-04-17 NOTE — Progress Notes (Addendum)
Prior to adriamycin push, noted sluggish blood return present. Port de accessed then re accessed with 1 1/2 in needle and flushed x3 with good blood return noted.  Tolerated chemo well.  Xanax 1 mg called in to [facility] for sleep.# 30 no refills.

## 2012-04-18 ENCOUNTER — Encounter (HOSPITAL_BASED_OUTPATIENT_CLINIC_OR_DEPARTMENT_OTHER): Payer: BC Managed Care – PPO

## 2012-04-18 VITALS — BP 147/91 | HR 83 | Temp 98.6°F | Resp 20

## 2012-04-18 DIAGNOSIS — C8190 Hodgkin lymphoma, unspecified, unspecified site: Secondary | ICD-10-CM

## 2012-04-18 DIAGNOSIS — Z5189 Encounter for other specified aftercare: Secondary | ICD-10-CM

## 2012-04-18 DIAGNOSIS — C8119 Nodular sclerosis classical Hodgkin lymphoma, extranodal and solid organ sites: Secondary | ICD-10-CM

## 2012-04-18 MED ORDER — PEGFILGRASTIM INJECTION 6 MG/0.6ML
6.0000 mg | Freq: Once | SUBCUTANEOUS | Status: AC
  Administered 2012-04-18: 6 mg via SUBCUTANEOUS

## 2012-04-18 MED ORDER — PEGFILGRASTIM INJECTION 6 MG/0.6ML
SUBCUTANEOUS | Status: AC
  Filled 2012-04-18: qty 0.6

## 2012-04-18 NOTE — Progress Notes (Signed)
[name] [name] presents today for injection per MD orders. Neulasta 6mg administered SQ in right Abdomen. Administration without incident. Patient tolerated well.  

## 2012-04-19 NOTE — Addendum Note (Signed)
Addended by: Oda Kilts on: 04/19/2012 10:09 AM   Modules accepted: Orders

## 2012-04-22 ENCOUNTER — Telehealth (HOSPITAL_COMMUNITY): Payer: Self-pay

## 2012-04-22 NOTE — Telephone Encounter (Signed)
Call from [place] with complaints of blurred vision.  States "I can see colors and I can see shapes but my vision is blurred enough that I can't watch TV or read and wanted to know if this was normal with chemotherapy."  When questioned further the blurred vision occurs more during the time that she is on the dexamethasone with the following instructions:Take 8 mg by mouth 2 (two) times daily with a meal. Take two times a day starting the day after chemotherapy for 2 days. Then take 1 tablet two times a day for 2 days. Then take 1 tablet x 1 day. This makes a total of 5 days of dexamethasone.  Wants to know what to do.

## 2012-04-22 NOTE — Addendum Note (Signed)
Addended by: Oda Kilts on: 04/22/2012 05:46 PM   Modules accepted: Orders

## 2012-04-23 ENCOUNTER — Inpatient Hospital Stay (HOSPITAL_COMMUNITY): Payer: BC Managed Care – PPO

## 2012-04-24 ENCOUNTER — Ambulatory Visit (HOSPITAL_COMMUNITY): Payer: BC Managed Care – PPO

## 2012-04-30 ENCOUNTER — Encounter (HOSPITAL_BASED_OUTPATIENT_CLINIC_OR_DEPARTMENT_OTHER): Payer: BC Managed Care – PPO

## 2012-04-30 VITALS — BP 141/78 | HR 86 | Temp 98.8°F | Resp 18 | Wt 193.8 lb

## 2012-04-30 DIAGNOSIS — C8119 Nodular sclerosis classical Hodgkin lymphoma, extranodal and solid organ sites: Secondary | ICD-10-CM

## 2012-04-30 DIAGNOSIS — Z5111 Encounter for antineoplastic chemotherapy: Secondary | ICD-10-CM

## 2012-04-30 DIAGNOSIS — C8190 Hodgkin lymphoma, unspecified, unspecified site: Secondary | ICD-10-CM

## 2012-04-30 LAB — COMPREHENSIVE METABOLIC PANEL
ALT: 15 U/L (ref 0–35)
AST: 14 U/L (ref 0–37)
Albumin: 3.4 g/dL — ABNORMAL LOW (ref 3.5–5.2)
Alkaline Phosphatase: 67 U/L (ref 39–117)
BUN: 12 mg/dL (ref 6–23)
CO2: 25 mEq/L (ref 19–32)
Calcium: 9.4 mg/dL (ref 8.4–10.5)
Chloride: 101 mEq/L (ref 96–112)
Creatinine, Ser: 0.73 mg/dL (ref 0.50–1.10)
GFR calc Af Amer: >90 mL/min (ref >90)
GFR calc non Af Amer: >90 mL/min (ref >90)
Glucose, Bld: 113 mg/dL — ABNORMAL HIGH (ref 70–99)
Potassium: 3.4 mEq/L — ABNORMAL LOW (ref 3.5–5.1)
Sodium: 135 mEq/L (ref 135–145)
Total Bilirubin: 0.2 mg/dL — ABNORMAL LOW (ref 0.3–1.2)
Total Protein: 6.4 g/dL (ref 6.0–8.3)

## 2012-04-30 LAB — CBC WITH DIFFERENTIAL/PLATELET
Basophils Absolute: 0 10*3/uL (ref 0.0–0.1)
Basophils Relative: 0 % (ref 0–1)
Eosinophils Absolute: 0.1 10*3/uL (ref 0.0–0.7)
Eosinophils Relative: 1 % (ref 0–5)
HCT: 34.4 % — ABNORMAL LOW (ref 36.0–46.0)
Hemoglobin: 12 g/dL (ref 12.0–15.0)
Lymphocytes Relative: 18 % (ref 12–46)
Lymphs Abs: 1.5 10*3/uL (ref 0.7–4.0)
MCH: 30.7 pg (ref 26.0–34.0)
MCHC: 34.9 g/dL (ref 30.0–36.0)
MCV: 88 fL (ref 78.0–100.0)
Monocytes Absolute: 0.6 10*3/uL (ref 0.1–1.0)
Monocytes Relative: 7 % (ref 3–12)
Neutro Abs: 6.1 10*3/uL (ref 1.7–7.7)
Neutrophils Relative %: 74 % (ref 43–77)
Platelets: 155 10*3/uL (ref 150–400)
RBC: 3.91 MIL/uL (ref 3.87–5.11)
RDW: 13.5 % (ref 11.5–15.5)
WBC: 8.3 10*3/uL (ref 4.0–10.5)

## 2012-04-30 LAB — LACTATE DEHYDROGENASE: LDH: 206 U/L (ref 94–250)

## 2012-04-30 LAB — SEDIMENTATION RATE: Sed Rate: 16 mm/hr (ref 0–22)

## 2012-04-30 MED ORDER — SODIUM CHLORIDE 0.9 % IV SOLN
Freq: Once | INTRAVENOUS | Status: AC
  Administered 2012-04-30: 16 mg via INTRAVENOUS
  Filled 2012-04-30: qty 8

## 2012-04-30 MED ORDER — SODIUM CHLORIDE 0.9 % IJ SOLN
10.0000 mL | INTRAMUSCULAR | Status: DC | PRN
  Administered 2012-04-30: 10 mL
  Filled 2012-04-30: qty 10

## 2012-04-30 MED ORDER — DEXAMETHASONE SODIUM PHOSPHATE 10 MG/ML IJ SOLN: 20.0000 mg | Freq: Once | INTRAMUSCULAR | Status: DC

## 2012-04-30 MED ORDER — SODIUM CHLORIDE 0.9 % IV SOLN
10.0000 [IU]/m2 | Freq: Once | INTRAVENOUS | Status: AC
  Administered 2012-04-30: 20 [IU] via INTRAVENOUS
  Filled 2012-04-30: qty 6.7

## 2012-04-30 MED ORDER — DOXORUBICIN HCL CHEMO IV INJECTION 2 MG/ML
25.0000 mg/m2 | Freq: Once | INTRAVENOUS | Status: AC
  Administered 2012-04-30: 50 mg via INTRAVENOUS
  Filled 2012-04-30: qty 25

## 2012-04-30 MED ORDER — SODIUM CHLORIDE 0.9 % IV SOLN
Freq: Once | INTRAVENOUS | Status: AC
  Administered 2012-04-30: 11:00:00 via INTRAVENOUS

## 2012-04-30 MED ORDER — SODIUM CHLORIDE 0.9 % IV SOLN: 16.0000 mg | Freq: Once | INTRAVENOUS | Status: DC

## 2012-04-30 MED ORDER — HEPARIN SOD (PORK) LOCK FLUSH 100 UNIT/ML IV SOLN
500.0000 [IU] | Freq: Once | INTRAVENOUS | Status: AC | PRN
  Administered 2012-04-30: 500 [IU]
  Filled 2012-04-30: qty 5

## 2012-04-30 MED ORDER — SODIUM CHLORIDE 0.9 % IV SOLN
375.0000 mg/m2 | Freq: Once | INTRAVENOUS | Status: AC
  Administered 2012-04-30: 760 mg via INTRAVENOUS
  Filled 2012-04-30: qty 38

## 2012-04-30 MED ORDER — VINBLASTINE SULFATE CHEMO INJECTION 1 MG/ML
6.0000 mg/m2 | Freq: Once | INTRAVENOUS | Status: AC
  Administered 2012-04-30: 12.2 mg via INTRAVENOUS
  Filled 2012-04-30: qty 12.2

## 2012-04-30 MED ORDER — HEPARIN SOD (PORK) LOCK FLUSH 100 UNIT/ML IV SOLN
INTRAVENOUS | Status: AC
  Filled 2012-04-30: qty 5

## 2012-04-30 NOTE — Patient Instructions (Addendum)
[facility] Discharge Instructions for Patients Receiving Chemotherapy  Today you received the following chemotherapy agents adriamycin, bleomycin, Dacarbazine and velban.  To help prevent nausea and vomiting after your treatment, we encourage you to take your nausea medication as directed. We will do 2 d echo, pulmonary function tests and pet scan after your third cycle of chemo  I called in refills on Ribaxin. I also called in Kdur (potasium) 10 meq. Take 1 twice a day for a month then 1 daily. You have refills on this.  If you develop nausea and vomiting that is not controlled by your nausea medication, call the clinic. If it is after clinic hours your family physician or the after hours number for the clinic or go to the Emergency Department.   BELOW ARE SYMPTOMS THAT SHOULD BE REPORTED IMMEDIATELY:  *FEVER GREATER THAN 101.0 F  *CHILLS WITH OR WITHOUT FEVER  NAUSEA AND VOMITING THAT IS NOT CONTROLLED WITH YOUR NAUSEA MEDICATION  *UNUSUAL SHORTNESS OF BREATH  *UNUSUAL BRUISING OR BLEEDING  TENDERNESS IN MOUTH AND THROAT WITH OR WITHOUT PRESENCE OF ULCERS  *URINARY PROBLEMS  *BOWEL PROBLEMS  UNUSUAL RASH Items with * indicate a potential emergency and should be followed up as soon as possible.  One of the nurses will contact you 24 hours after your treatment. Please let the nurse know about any problems that you may have experienced. Feel free to call the clinic you have any questions or concerns. The clinic phone number is [phone].   I have been informed and understand all the instructions given to me. I know to contact the clinic, my physician, or go to the Emergency Department if any problems should occur. I do not have any questions at this time, but understand that I may call the clinic during office hours or the Patient Navigator at [phone] should I have any questions or need assistance in obtaining follow up  care.    __________________________________________  _____________  __________ Signature of Patient or Authorized Representative            Date                   Time    __________________________________________ Nurse's Signature

## 2012-04-30 NOTE — Progress Notes (Signed)
Tolerated infusion well. 

## 2012-05-01 ENCOUNTER — Encounter (HOSPITAL_BASED_OUTPATIENT_CLINIC_OR_DEPARTMENT_OTHER): Payer: BC Managed Care – PPO

## 2012-05-01 ENCOUNTER — Inpatient Hospital Stay (HOSPITAL_COMMUNITY): Payer: BC Managed Care – PPO

## 2012-05-01 VITALS — BP 132/84 | HR 72

## 2012-05-01 DIAGNOSIS — C8119 Nodular sclerosis classical Hodgkin lymphoma, extranodal and solid organ sites: Secondary | ICD-10-CM

## 2012-05-01 DIAGNOSIS — C8190 Hodgkin lymphoma, unspecified, unspecified site: Secondary | ICD-10-CM

## 2012-05-01 DIAGNOSIS — Z5189 Encounter for other specified aftercare: Secondary | ICD-10-CM

## 2012-05-01 MED ORDER — PEGFILGRASTIM INJECTION 6 MG/0.6ML
SUBCUTANEOUS | Status: AC
  Filled 2012-05-01: qty 0.6

## 2012-05-01 MED ORDER — PEGFILGRASTIM INJECTION 6 MG/0.6ML
6.0000 mg | Freq: Once | SUBCUTANEOUS | Status: AC
  Administered 2012-05-01: 6 mg via SUBCUTANEOUS

## 2012-05-01 NOTE — Progress Notes (Signed)
Tolerated injection well. 

## 2012-05-02 ENCOUNTER — Ambulatory Visit (HOSPITAL_COMMUNITY): Payer: BC Managed Care – PPO

## 2012-05-03 ENCOUNTER — Telehealth (HOSPITAL_COMMUNITY): Payer: Self-pay | Admitting: *Deleted

## 2012-05-03 NOTE — Telephone Encounter (Signed)
I called in Magic Mouthwash 1 tsp qid #300 with 2 refills to [facility] and notified patient. Patient has a raw and very sore tongue. Orders from [facility].

## 2012-05-07 ENCOUNTER — Ambulatory Visit (HOSPITAL_COMMUNITY): Payer: BC Managed Care – PPO | Admitting: Oncology

## 2012-05-07 ENCOUNTER — Inpatient Hospital Stay (HOSPITAL_COMMUNITY): Payer: BC Managed Care – PPO

## 2012-05-08 ENCOUNTER — Ambulatory Visit (HOSPITAL_COMMUNITY): Payer: BC Managed Care – PPO

## 2012-05-14 ENCOUNTER — Encounter (HOSPITAL_COMMUNITY): Payer: BC Managed Care – PPO | Attending: Oncology

## 2012-05-14 ENCOUNTER — Encounter (HOSPITAL_COMMUNITY): Payer: BC Managed Care – PPO

## 2012-05-14 VITALS — BP 134/84 | HR 86 | Temp 97.8°F | Resp 16 | Wt 197.4 lb

## 2012-05-14 DIAGNOSIS — C8119 Nodular sclerosis classical Hodgkin lymphoma, extranodal and solid organ sites: Secondary | ICD-10-CM

## 2012-05-14 DIAGNOSIS — C8190 Hodgkin lymphoma, unspecified, unspecified site: Secondary | ICD-10-CM | POA: Insufficient documentation

## 2012-05-14 DIAGNOSIS — Z5111 Encounter for antineoplastic chemotherapy: Secondary | ICD-10-CM

## 2012-05-14 LAB — COMPREHENSIVE METABOLIC PANEL
ALT: 15 U/L (ref 0–35)
AST: 16 U/L (ref 0–37)
Albumin: 3.3 g/dL — ABNORMAL LOW (ref 3.5–5.2)
Alkaline Phosphatase: 72 U/L (ref 39–117)
BUN: 16 mg/dL (ref 6–23)
CO2: 25 mEq/L (ref 19–32)
Calcium: 9.3 mg/dL (ref 8.4–10.5)
Chloride: 103 mEq/L (ref 96–112)
Creatinine, Ser: 0.71 mg/dL (ref 0.50–1.10)
GFR calc Af Amer: >90 mL/min (ref >90)
GFR calc non Af Amer: >90 mL/min (ref >90)
Glucose, Bld: 130 mg/dL — ABNORMAL HIGH (ref 70–99)
Potassium: 3.6 mEq/L (ref 3.5–5.1)
Sodium: 139 mEq/L (ref 135–145)
Total Bilirubin: 0.3 mg/dL (ref 0.3–1.2)
Total Protein: 6.4 g/dL (ref 6.0–8.3)

## 2012-05-14 LAB — CBC WITH DIFFERENTIAL/PLATELET
Basophils Absolute: 0 10*3/uL (ref 0.0–0.1)
Basophils Relative: 0 % (ref 0–1)
Eosinophils Absolute: 0.2 10*3/uL (ref 0.0–0.7)
Eosinophils Relative: 2 % (ref 0–5)
HCT: 32.7 % — ABNORMAL LOW (ref 36.0–46.0)
Hemoglobin: 11.3 g/dL — ABNORMAL LOW (ref 12.0–15.0)
Lymphocytes Relative: 15 % (ref 12–46)
Lymphs Abs: 1.5 10*3/uL (ref 0.7–4.0)
MCH: 30.5 pg (ref 26.0–34.0)
MCHC: 34.6 g/dL (ref 30.0–36.0)
MCV: 88.4 fL (ref 78.0–100.0)
Monocytes Absolute: 0.6 10*3/uL (ref 0.1–1.0)
Monocytes Relative: 6 % (ref 3–12)
Neutro Abs: 7.9 10*3/uL — ABNORMAL HIGH (ref 1.7–7.7)
Neutrophils Relative %: 77 % (ref 43–77)
Platelets: 178 10*3/uL (ref 150–400)
RBC: 3.7 MIL/uL — ABNORMAL LOW (ref 3.87–5.11)
RDW: 14.6 % (ref 11.5–15.5)
WBC: 10.2 10*3/uL (ref 4.0–10.5)

## 2012-05-14 MED ORDER — SODIUM CHLORIDE 0.9 % IV SOLN
Freq: Once | INTRAVENOUS | Status: AC
  Administered 2012-05-14: 10:00:00 via INTRAVENOUS

## 2012-05-14 MED ORDER — HEPARIN SOD (PORK) LOCK FLUSH 100 UNIT/ML IV SOLN
500.0000 [IU] | Freq: Once | INTRAVENOUS | Status: AC | PRN
  Administered 2012-05-14: 500 [IU]
  Filled 2012-05-14: qty 5

## 2012-05-14 MED ORDER — HEPARIN SOD (PORK) LOCK FLUSH 100 UNIT/ML IV SOLN
INTRAVENOUS | Status: AC
  Filled 2012-05-14: qty 5

## 2012-05-14 MED ORDER — SODIUM CHLORIDE 0.9 % IV SOLN: 16.0000 mg | Freq: Once | INTRAVENOUS | Status: DC

## 2012-05-14 MED ORDER — SODIUM CHLORIDE 0.9 % IJ SOLN
10.0000 mL | INTRAMUSCULAR | Status: DC | PRN
  Filled 2012-05-14: qty 10

## 2012-05-14 MED ORDER — DEXAMETHASONE SODIUM PHOSPHATE 10 MG/ML IJ SOLN: 20.0000 mg | Freq: Once | INTRAMUSCULAR | Status: DC

## 2012-05-14 MED ORDER — SODIUM CHLORIDE 0.9 % IV SOLN
375.0000 mg/m2 | Freq: Once | INTRAVENOUS | Status: AC
  Administered 2012-05-14: 760 mg via INTRAVENOUS
  Filled 2012-05-14: qty 38

## 2012-05-14 MED ORDER — DOXORUBICIN HCL CHEMO IV INJECTION 2 MG/ML
25.0000 mg/m2 | Freq: Once | INTRAVENOUS | Status: AC
  Administered 2012-05-14: 50 mg via INTRAVENOUS
  Filled 2012-05-14: qty 25

## 2012-05-14 MED ORDER — SODIUM CHLORIDE 0.9 % IV SOLN
Freq: Once | INTRAVENOUS | Status: AC
  Administered 2012-05-14: 16 mg via INTRAVENOUS
  Filled 2012-05-14: qty 8

## 2012-05-14 MED ORDER — SODIUM CHLORIDE 0.9 % IV SOLN
10.0000 [IU]/m2 | Freq: Once | INTRAVENOUS | Status: AC
  Administered 2012-05-14: 20 [IU] via INTRAVENOUS
  Filled 2012-05-14: qty 6.7

## 2012-05-14 MED ORDER — VINBLASTINE SULFATE CHEMO INJECTION 1 MG/ML
6.0000 mg/m2 | Freq: Once | INTRAVENOUS | Status: AC
  Administered 2012-05-14: 12.2 mg via INTRAVENOUS
  Filled 2012-05-14: qty 12.2

## 2012-05-15 ENCOUNTER — Encounter (HOSPITAL_BASED_OUTPATIENT_CLINIC_OR_DEPARTMENT_OTHER): Payer: BC Managed Care – PPO

## 2012-05-15 ENCOUNTER — Inpatient Hospital Stay (HOSPITAL_COMMUNITY): Payer: BC Managed Care – PPO

## 2012-05-15 DIAGNOSIS — C8119 Nodular sclerosis classical Hodgkin lymphoma, extranodal and solid organ sites: Secondary | ICD-10-CM

## 2012-05-15 DIAGNOSIS — C8190 Hodgkin lymphoma, unspecified, unspecified site: Secondary | ICD-10-CM

## 2012-05-15 DIAGNOSIS — Z5189 Encounter for other specified aftercare: Secondary | ICD-10-CM

## 2012-05-15 MED ORDER — PEGFILGRASTIM INJECTION 6 MG/0.6ML
SUBCUTANEOUS | Status: AC
  Filled 2012-05-15: qty 0.6

## 2012-05-15 MED ORDER — PEGFILGRASTIM INJECTION 6 MG/0.6ML
6.0000 mg | Freq: Once | SUBCUTANEOUS | Status: AC
  Administered 2012-05-15: 6 mg via SUBCUTANEOUS

## 2012-05-15 NOTE — Progress Notes (Signed)
[name] [name] presents today for injection per MD orders. Neulasta 6mg administered SQ in right Abdomen. Administration without incident. Patient tolerated well.  

## 2012-05-16 ENCOUNTER — Ambulatory Visit (HOSPITAL_COMMUNITY): Payer: BC Managed Care – PPO

## 2012-05-21 ENCOUNTER — Inpatient Hospital Stay (HOSPITAL_COMMUNITY): Payer: BC Managed Care – PPO

## 2012-05-22 ENCOUNTER — Ambulatory Visit (HOSPITAL_COMMUNITY): Payer: BC Managed Care – PPO

## 2012-05-28 ENCOUNTER — Encounter (HOSPITAL_BASED_OUTPATIENT_CLINIC_OR_DEPARTMENT_OTHER): Payer: BC Managed Care – PPO

## 2012-05-28 VITALS — BP 131/76 | HR 100 | Temp 98.5°F | Resp 18 | Wt 198.4 lb

## 2012-05-28 DIAGNOSIS — Z5111 Encounter for antineoplastic chemotherapy: Secondary | ICD-10-CM

## 2012-05-28 DIAGNOSIS — C8190 Hodgkin lymphoma, unspecified, unspecified site: Secondary | ICD-10-CM

## 2012-05-28 DIAGNOSIS — C8119 Nodular sclerosis classical Hodgkin lymphoma, extranodal and solid organ sites: Secondary | ICD-10-CM

## 2012-05-28 LAB — COMPREHENSIVE METABOLIC PANEL
ALT: 13 U/L (ref 0–35)
AST: 17 U/L (ref 0–37)
Albumin: 3.5 g/dL (ref 3.5–5.2)
Alkaline Phosphatase: 84 U/L (ref 39–117)
BUN: 14 mg/dL (ref 6–23)
CO2: 27 mEq/L (ref 19–32)
Calcium: 9.7 mg/dL (ref 8.4–10.5)
Chloride: 101 mEq/L (ref 96–112)
Creatinine, Ser: 0.73 mg/dL (ref 0.50–1.10)
GFR calc Af Amer: >90 mL/min (ref >90)
GFR calc non Af Amer: >90 mL/min (ref >90)
Glucose, Bld: 108 mg/dL — ABNORMAL HIGH (ref 70–99)
Potassium: 3.8 mEq/L (ref 3.5–5.1)
Sodium: 136 mEq/L (ref 135–145)
Total Bilirubin: 0.2 mg/dL — ABNORMAL LOW (ref 0.3–1.2)
Total Protein: 6.8 g/dL (ref 6.0–8.3)

## 2012-05-28 LAB — CBC WITH DIFFERENTIAL/PLATELET
Basophils Absolute: 0 10*3/uL (ref 0.0–0.1)
Basophils Relative: 0 % (ref 0–1)
Eosinophils Absolute: 0.2 10*3/uL (ref 0.0–0.7)
Eosinophils Relative: 2 % (ref 0–5)
HCT: 31.1 % — ABNORMAL LOW (ref 36.0–46.0)
Hemoglobin: 11 g/dL — ABNORMAL LOW (ref 12.0–15.0)
Lymphocytes Relative: 16 % (ref 12–46)
Lymphs Abs: 1.5 10*3/uL (ref 0.7–4.0)
MCH: 31.4 pg (ref 26.0–34.0)
MCHC: 35.4 g/dL (ref 30.0–36.0)
MCV: 88.9 fL (ref 78.0–100.0)
Monocytes Absolute: 0.6 10*3/uL (ref 0.1–1.0)
Monocytes Relative: 6 % (ref 3–12)
Neutro Abs: 7.3 10*3/uL (ref 1.7–7.7)
Neutrophils Relative %: 76 % (ref 43–77)
Platelets: 174 10*3/uL (ref 150–400)
RBC: 3.5 MIL/uL — ABNORMAL LOW (ref 3.87–5.11)
RDW: 15.8 % — ABNORMAL HIGH (ref 11.5–15.5)
WBC Morphology: INCREASED
WBC: 9.6 10*3/uL (ref 4.0–10.5)

## 2012-05-28 LAB — SEDIMENTATION RATE: Sed Rate: 38 mm/hr — ABNORMAL HIGH (ref 0–22)

## 2012-05-28 LAB — LACTATE DEHYDROGENASE: LDH: 213 U/L (ref 94–250)

## 2012-05-28 MED ORDER — SODIUM CHLORIDE 0.9 % IJ SOLN
10.0000 mL | INTRAMUSCULAR | Status: DC | PRN
  Administered 2012-05-28: 10 mL
  Filled 2012-05-28: qty 10

## 2012-05-28 MED ORDER — HEPARIN SOD (PORK) LOCK FLUSH 100 UNIT/ML IV SOLN
INTRAVENOUS | Status: AC
  Filled 2012-05-28: qty 5

## 2012-05-28 MED ORDER — HEPARIN SOD (PORK) LOCK FLUSH 100 UNIT/ML IV SOLN
500.0000 [IU] | Freq: Once | INTRAVENOUS | Status: AC | PRN
  Administered 2012-05-28: 500 [IU]
  Filled 2012-05-28: qty 5

## 2012-05-28 MED ORDER — SODIUM CHLORIDE 0.9 % IV SOLN
375.0000 mg/m2 | Freq: Once | INTRAVENOUS | Status: AC
  Administered 2012-05-28: 760 mg via INTRAVENOUS
  Filled 2012-05-28: qty 38

## 2012-05-28 MED ORDER — DOXORUBICIN HCL CHEMO IV INJECTION 2 MG/ML
25.0000 mg/m2 | Freq: Once | INTRAVENOUS | Status: AC
  Administered 2012-05-28: 50 mg via INTRAVENOUS
  Filled 2012-05-28: qty 25

## 2012-05-28 MED ORDER — SODIUM CHLORIDE 0.9 % IV SOLN
Freq: Once | INTRAVENOUS | Status: AC
  Administered 2012-05-28: 10:00:00 via INTRAVENOUS

## 2012-05-28 MED ORDER — VINBLASTINE SULFATE CHEMO INJECTION 1 MG/ML
6.0000 mg/m2 | Freq: Once | INTRAVENOUS | Status: AC
  Administered 2012-05-28: 12.2 mg via INTRAVENOUS
  Filled 2012-05-28: qty 12.2

## 2012-05-28 MED ORDER — SODIUM CHLORIDE 0.9 % IV SOLN
Freq: Once | INTRAVENOUS | Status: AC
  Administered 2012-05-28: 16 mg via INTRAVENOUS
  Filled 2012-05-28: qty 8

## 2012-05-28 MED ORDER — SODIUM CHLORIDE 0.9 % IV SOLN
10.0000 [IU]/m2 | Freq: Once | INTRAVENOUS | Status: AC
  Administered 2012-05-28: 20 [IU] via INTRAVENOUS
  Filled 2012-05-28: qty 6.7

## 2012-05-28 NOTE — Progress Notes (Signed)
Tolerated well

## 2012-05-29 ENCOUNTER — Encounter (HOSPITAL_BASED_OUTPATIENT_CLINIC_OR_DEPARTMENT_OTHER): Payer: BC Managed Care – PPO

## 2012-05-29 ENCOUNTER — Inpatient Hospital Stay (HOSPITAL_COMMUNITY): Payer: BC Managed Care – PPO

## 2012-05-29 VITALS — BP 143/85 | HR 77 | Temp 98.5°F | Resp 18

## 2012-05-29 DIAGNOSIS — C8190 Hodgkin lymphoma, unspecified, unspecified site: Secondary | ICD-10-CM

## 2012-05-29 DIAGNOSIS — C8119 Nodular sclerosis classical Hodgkin lymphoma, extranodal and solid organ sites: Secondary | ICD-10-CM

## 2012-05-29 DIAGNOSIS — Z5189 Encounter for other specified aftercare: Secondary | ICD-10-CM

## 2012-05-29 MED ORDER — PEGFILGRASTIM INJECTION 6 MG/0.6ML
SUBCUTANEOUS | Status: AC
  Filled 2012-05-29: qty 0.6

## 2012-05-29 MED ORDER — PEGFILGRASTIM INJECTION 6 MG/0.6ML
6.0000 mg | Freq: Once | SUBCUTANEOUS | Status: AC
  Administered 2012-05-29: 6 mg via SUBCUTANEOUS

## 2012-05-29 NOTE — Progress Notes (Signed)
Sybilla L Morson presents today for injection per MD orders. Neulasta 6mg administered SQ in right Abdomen. Administration without incident. Patient tolerated well.  

## 2012-05-30 ENCOUNTER — Ambulatory Visit (HOSPITAL_COMMUNITY): Payer: BC Managed Care – PPO

## 2012-06-04 ENCOUNTER — Ambulatory Visit (HOSPITAL_COMMUNITY): Payer: BC Managed Care – PPO | Admitting: Oncology

## 2012-06-04 ENCOUNTER — Inpatient Hospital Stay (HOSPITAL_COMMUNITY): Payer: BC Managed Care – PPO

## 2012-06-11 ENCOUNTER — Ambulatory Visit (HOSPITAL_COMMUNITY): Payer: BC Managed Care – PPO | Admitting: Oncology

## 2012-06-11 ENCOUNTER — Encounter (HOSPITAL_BASED_OUTPATIENT_CLINIC_OR_DEPARTMENT_OTHER): Payer: BC Managed Care – PPO

## 2012-06-11 ENCOUNTER — Inpatient Hospital Stay (HOSPITAL_COMMUNITY): Payer: BC Managed Care – PPO

## 2012-06-11 VITALS — BP 130/79 | HR 74 | Temp 98.6°F | Resp 16 | Wt 200.2 lb

## 2012-06-11 DIAGNOSIS — C8190 Hodgkin lymphoma, unspecified, unspecified site: Secondary | ICD-10-CM

## 2012-06-11 DIAGNOSIS — C8119 Nodular sclerosis classical Hodgkin lymphoma, extranodal and solid organ sites: Secondary | ICD-10-CM

## 2012-06-11 DIAGNOSIS — Z5111 Encounter for antineoplastic chemotherapy: Secondary | ICD-10-CM

## 2012-06-11 LAB — CBC WITH DIFFERENTIAL/PLATELET
Basophils Absolute: 0.1 10*3/uL (ref 0.0–0.1)
Basophils Relative: 1 % (ref 0–1)
Eosinophils Absolute: 0.2 10*3/uL (ref 0.0–0.7)
Eosinophils Relative: 2 % (ref 0–5)
HCT: 32.1 % — ABNORMAL LOW (ref 36.0–46.0)
Hemoglobin: 11.2 g/dL — ABNORMAL LOW (ref 12.0–15.0)
Lymphocytes Relative: 16 % (ref 12–46)
Lymphs Abs: 1.7 10*3/uL (ref 0.7–4.0)
MCH: 32 pg (ref 26.0–34.0)
MCHC: 34.9 g/dL (ref 30.0–36.0)
MCV: 91.7 fL (ref 78.0–100.0)
Monocytes Absolute: 0.8 10*3/uL (ref 0.1–1.0)
Monocytes Relative: 8 % (ref 3–12)
Neutro Abs: 7.8 10*3/uL — ABNORMAL HIGH (ref 1.7–7.7)
Neutrophils Relative %: 74 % (ref 43–77)
Platelets: 188 10*3/uL (ref 150–400)
RBC: 3.5 MIL/uL — ABNORMAL LOW (ref 3.87–5.11)
RDW: 16.9 % — ABNORMAL HIGH (ref 11.5–15.5)
WBC: 10.5 10*3/uL (ref 4.0–10.5)

## 2012-06-11 LAB — COMPREHENSIVE METABOLIC PANEL
ALT: 14 U/L (ref 0–35)
AST: 17 U/L (ref 0–37)
Albumin: 3.6 g/dL (ref 3.5–5.2)
Alkaline Phosphatase: 74 U/L (ref 39–117)
BUN: 14 mg/dL (ref 6–23)
CO2: 27 mEq/L (ref 19–32)
Calcium: 9.4 mg/dL (ref 8.4–10.5)
Chloride: 100 mEq/L (ref 96–112)
Creatinine, Ser: 0.69 mg/dL (ref 0.50–1.10)
GFR calc Af Amer: >90 mL/min (ref >90)
GFR calc non Af Amer: >90 mL/min (ref >90)
Glucose, Bld: 107 mg/dL — ABNORMAL HIGH (ref 70–99)
Potassium: 3.6 mEq/L (ref 3.5–5.1)
Sodium: 136 mEq/L (ref 135–145)
Total Bilirubin: 0.3 mg/dL (ref 0.3–1.2)
Total Protein: 6.8 g/dL (ref 6.0–8.3)

## 2012-06-11 LAB — SEDIMENTATION RATE: Sed Rate: 16 mm/hr (ref 0–22)

## 2012-06-11 LAB — LACTATE DEHYDROGENASE: LDH: 221 U/L (ref 94–250)

## 2012-06-11 MED ORDER — VINBLASTINE SULFATE CHEMO INJECTION 1 MG/ML
6.0000 mg/m2 | Freq: Once | INTRAVENOUS | Status: AC
  Administered 2012-06-11: 12.2 mg via INTRAVENOUS
  Filled 2012-06-11: qty 12.2

## 2012-06-11 MED ORDER — HEPARIN SOD (PORK) LOCK FLUSH 100 UNIT/ML IV SOLN
INTRAVENOUS | Status: AC
  Filled 2012-06-11: qty 5

## 2012-06-11 MED ORDER — DOXORUBICIN HCL CHEMO IV INJECTION 2 MG/ML
25.0000 mg/m2 | Freq: Once | INTRAVENOUS | Status: AC
  Administered 2012-06-11: 50 mg via INTRAVENOUS
  Filled 2012-06-11: qty 25

## 2012-06-11 MED ORDER — SODIUM CHLORIDE 0.9 % IJ SOLN
10.0000 mL | INTRAMUSCULAR | Status: DC | PRN
  Administered 2012-06-11: 10 mL
  Filled 2012-06-11: qty 10

## 2012-06-11 MED ORDER — SODIUM CHLORIDE 0.9 % IV SOLN
375.0000 mg/m2 | Freq: Once | INTRAVENOUS | Status: AC
  Administered 2012-06-11: 760 mg via INTRAVENOUS
  Filled 2012-06-11: qty 38

## 2012-06-11 MED ORDER — SODIUM CHLORIDE 0.9 % IV SOLN
Freq: Once | INTRAVENOUS | Status: AC
  Administered 2012-06-11: 500 mL via INTRAVENOUS

## 2012-06-11 MED ORDER — SODIUM CHLORIDE 0.9 % IV SOLN
10.0000 [IU]/m2 | Freq: Once | INTRAVENOUS | Status: AC
  Administered 2012-06-11: 20 [IU] via INTRAVENOUS
  Filled 2012-06-11: qty 6.7

## 2012-06-11 MED ORDER — ONDANSETRON HCL 40 MG/20ML IJ SOLN
Freq: Once | INTRAMUSCULAR | Status: AC
Start: 2012-06-11 — End: 2012-06-11
  Administered 2012-06-11: 16 mg via INTRAVENOUS
  Filled 2012-06-11: qty 8

## 2012-06-11 MED ORDER — HEPARIN SOD (PORK) LOCK FLUSH 100 UNIT/ML IV SOLN
500.0000 [IU] | Freq: Once | INTRAVENOUS | Status: AC | PRN
  Administered 2012-06-11: 500 [IU]
  Filled 2012-06-11: qty 5

## 2012-06-11 MED ORDER — SODIUM CHLORIDE 0.9 % IJ SOLN
INTRAMUSCULAR | Status: AC
  Filled 2012-06-11: qty 10

## 2012-06-11 NOTE — Progress Notes (Signed)
Tolerated chemo well,

## 2012-06-12 ENCOUNTER — Encounter (HOSPITAL_COMMUNITY): Payer: BC Managed Care – PPO | Attending: Oncology

## 2012-06-12 VITALS — BP 126/66 | HR 66 | Temp 98.1°F | Resp 18

## 2012-06-12 DIAGNOSIS — Z5189 Encounter for other specified aftercare: Secondary | ICD-10-CM

## 2012-06-12 DIAGNOSIS — C8119 Nodular sclerosis classical Hodgkin lymphoma, extranodal and solid organ sites: Secondary | ICD-10-CM

## 2012-06-12 DIAGNOSIS — C8190 Hodgkin lymphoma, unspecified, unspecified site: Secondary | ICD-10-CM | POA: Insufficient documentation

## 2012-06-12 MED ORDER — PEGFILGRASTIM INJECTION 6 MG/0.6ML
6.0000 mg | Freq: Once | SUBCUTANEOUS | Status: AC
  Administered 2012-06-12: 6 mg via SUBCUTANEOUS

## 2012-06-12 MED ORDER — PEGFILGRASTIM INJECTION 6 MG/0.6ML
SUBCUTANEOUS | Status: AC
  Filled 2012-06-12: qty 0.6

## 2012-06-12 NOTE — Progress Notes (Signed)
[name] [name] presents today for injection per MD orders. Neulasta 6mg administered SQ in right Abdomen. Administration without incident. Patient tolerated well.  

## 2012-06-19 ENCOUNTER — Ambulatory Visit (HOSPITAL_COMMUNITY)
Admission: RE | Admit: 2012-06-19 | Discharge: 2012-06-19 | Disposition: A | Discharge status: Home / Self Care | Payer: BC Managed Care – PPO | Source: Ambulatory Visit | Attending: Oncology | Admitting: Oncology

## 2012-06-19 DIAGNOSIS — C8190 Hodgkin lymphoma, unspecified, unspecified site: Secondary | ICD-10-CM | POA: Insufficient documentation

## 2012-06-19 DIAGNOSIS — I10 Essential (primary) hypertension: Secondary | ICD-10-CM

## 2012-06-19 DIAGNOSIS — Z9221 Personal history of antineoplastic chemotherapy: Secondary | ICD-10-CM | POA: Insufficient documentation

## 2012-06-19 DIAGNOSIS — Z5111 Encounter for antineoplastic chemotherapy: Secondary | ICD-10-CM

## 2012-06-19 NOTE — Progress Notes (Signed)
*  PRELIMINARY RESULTS* Echocardiogram 2D Echocardiogram has been performed.  [name] [name] 06/19/2012, 1:10 PM

## 2012-06-20 ENCOUNTER — Encounter (HOSPITAL_COMMUNITY): Payer: Self-pay

## 2012-06-20 ENCOUNTER — Encounter (HOSPITAL_COMMUNITY)
Admission: RE | Admit: 2012-06-20 | Discharge: 2012-06-20 | Disposition: A | Discharge status: Home / Self Care | Payer: BC Managed Care – PPO | Source: Ambulatory Visit | Attending: Oncology | Admitting: Oncology

## 2012-06-20 DIAGNOSIS — C8190 Hodgkin lymphoma, unspecified, unspecified site: Secondary | ICD-10-CM | POA: Insufficient documentation

## 2012-06-20 LAB — GLUCOSE, CAPILLARY: Glucose-Capillary: 92 mg/dL (ref 70–99)

## 2012-06-20 MED ORDER — FLUDEOXYGLUCOSE F - 18 (FDG) INJECTION
18.0000 | Freq: Once | INTRAVENOUS | Status: AC | PRN
  Administered 2012-06-20: 18 via INTRAVENOUS

## 2012-06-21 NOTE — Procedures (Signed)
NAMELATICHA, FERRUCCI             ACCOUNT NO.:  [id number]  MEDICAL RECORD NO.:  [id number]  LOCATION:                                 FACILITY:  PHYSICIAN:  Travers Goodley L. Juanetta Gosling, M.D.DATE OF BIRTH:  12-Nov-1970  DATE OF PROCEDURE:  06/20/2012 DATE OF DISCHARGE:                           PULMONARY FUNCTION TEST   Patient of Dr. Mariel Sleet.  REASON FOR PULMONARY FUNCTION TESTING:  Hodgkin lymphoma.  1. Spirometry is normal. 2. Lung volumes are normal. 3. DLCO is mildly reduced, but corrects when ventilation is taken into     account. 4. Airway resistance is normal. 5. This is an essentially normal pulmonary function test.     Ramon Dredge L. Juanetta Gosling, M.D.     ELH/MEDQ  D:  06/20/2012  T:  06/21/2012  Job:  [id number]  cc:   Ladona Horns. Mariel Sleet, MD Fax: [phone]

## 2012-06-22 NOTE — Procedures (Signed)
[name] [name], [name] [name] NO.:  [id number]  MEDICAL RECORD NO.:  [id number]  LOCATION:  XRAY                         FACILITY:  [facility]  PHYSICIAN:  Tayvian Holycross L. Juanetta Gosling, M.D.DATE OF BIRTH:  August 12, 1970  DATE OF PROCEDURE: DATE OF DISCHARGE:                           PULMONARY FUNCTION TEST   REASON FOR PULMONARY FUNCTION TESTING:  Hodgkin lymphoma.  1. Spirometry is normal. 2. Lung volumes are normal. 3. DLCO is mildly reduced, but does correct slightly when ventilation     is considered. 4. Airway resistance is normal. 5. With the exception of the DLCO.  This is a normal pulmonary     function test. 6. Please note that on the DLCO test there may have been an error.     Tashon Capp L. Juanetta Gosling, M.D.     ELH/MEDQ  D:  06/21/2012  T:  06/22/2012  Job:  [id number]  cc:   Ladona Horns. Mariel Sleet, MD Fax: [phone]

## 2012-06-25 ENCOUNTER — Encounter (HOSPITAL_BASED_OUTPATIENT_CLINIC_OR_DEPARTMENT_OTHER): Payer: BC Managed Care – PPO | Admitting: Oncology

## 2012-06-25 ENCOUNTER — Encounter (HOSPITAL_BASED_OUTPATIENT_CLINIC_OR_DEPARTMENT_OTHER): Payer: BC Managed Care – PPO

## 2012-06-25 VITALS — BP 114/74 | HR 114 | Temp 98.3°F | Resp 20

## 2012-06-25 DIAGNOSIS — C8190 Hodgkin lymphoma, unspecified, unspecified site: Secondary | ICD-10-CM

## 2012-06-25 DIAGNOSIS — C8119 Nodular sclerosis classical Hodgkin lymphoma, extranodal and solid organ sites: Secondary | ICD-10-CM

## 2012-06-25 DIAGNOSIS — Z5111 Encounter for antineoplastic chemotherapy: Secondary | ICD-10-CM

## 2012-06-25 LAB — CBC WITH DIFFERENTIAL/PLATELET
Basophils Absolute: 0.1 10*3/uL (ref 0.0–0.1)
Basophils Relative: 1 % (ref 0–1)
Eosinophils Absolute: 0.3 10*3/uL (ref 0.0–0.7)
Eosinophils Relative: 3 % (ref 0–5)
HCT: 32.5 % — ABNORMAL LOW (ref 36.0–46.0)
Hemoglobin: 11.3 g/dL — ABNORMAL LOW (ref 12.0–15.0)
Lymphocytes Relative: 14 % (ref 12–46)
Lymphs Abs: 1.5 10*3/uL (ref 0.7–4.0)
MCH: 32.2 pg (ref 26.0–34.0)
MCHC: 34.8 g/dL (ref 30.0–36.0)
MCV: 92.6 fL (ref 78.0–100.0)
Monocytes Absolute: 0.8 10*3/uL (ref 0.1–1.0)
Monocytes Relative: 7 % (ref 3–12)
Neutro Abs: 8.3 10*3/uL — ABNORMAL HIGH (ref 1.7–7.7)
Neutrophils Relative %: 76 % (ref 43–77)
Platelets: 191 10*3/uL (ref 150–400)
RBC: 3.51 MIL/uL — ABNORMAL LOW (ref 3.87–5.11)
RDW: 16.4 % — ABNORMAL HIGH (ref 11.5–15.5)
WBC: 10.9 10*3/uL — ABNORMAL HIGH (ref 4.0–10.5)

## 2012-06-25 LAB — COMPREHENSIVE METABOLIC PANEL
ALT: 12 U/L (ref 0–35)
AST: 17 U/L (ref 0–37)
Albumin: 3.6 g/dL (ref 3.5–5.2)
Alkaline Phosphatase: 76 U/L (ref 39–117)
BUN: 14 mg/dL (ref 6–23)
CO2: 28 mEq/L (ref 19–32)
Calcium: 9.5 mg/dL (ref 8.4–10.5)
Chloride: 100 mEq/L (ref 96–112)
Creatinine, Ser: 0.77 mg/dL (ref 0.50–1.10)
GFR calc Af Amer: >90 mL/min (ref >90)
GFR calc non Af Amer: >90 mL/min (ref >90)
Glucose, Bld: 106 mg/dL — ABNORMAL HIGH (ref 70–99)
Potassium: 3.6 mEq/L (ref 3.5–5.1)
Sodium: 137 mEq/L (ref 135–145)
Total Bilirubin: 0.2 mg/dL — ABNORMAL LOW (ref 0.3–1.2)
Total Protein: 6.9 g/dL (ref 6.0–8.3)

## 2012-06-25 MED ORDER — HEPARIN SOD (PORK) LOCK FLUSH 100 UNIT/ML IV SOLN
INTRAVENOUS | Status: AC
  Filled 2012-06-25: qty 5

## 2012-06-25 MED ORDER — SODIUM CHLORIDE 0.9 % IV SOLN
375.0000 mg/m2 | Freq: Once | INTRAVENOUS | Status: AC
  Administered 2012-06-25: 760 mg via INTRAVENOUS
  Filled 2012-06-25: qty 38

## 2012-06-25 MED ORDER — VINBLASTINE SULFATE CHEMO INJECTION 1 MG/ML
6.0000 mg/m2 | Freq: Once | INTRAVENOUS | Status: AC
  Administered 2012-06-25: 12.2 mg via INTRAVENOUS
  Filled 2012-06-25: qty 12.2

## 2012-06-25 MED ORDER — SODIUM CHLORIDE 0.9 % IV SOLN
Freq: Once | INTRAVENOUS | Status: AC
  Administered 2012-06-25: 09:00:00 via INTRAVENOUS

## 2012-06-25 MED ORDER — SODIUM CHLORIDE 0.9 % IV SOLN
Freq: Once | INTRAVENOUS | Status: AC
  Administered 2012-06-25: 16 mg via INTRAVENOUS
  Filled 2012-06-25: qty 8

## 2012-06-25 MED ORDER — DOXORUBICIN HCL CHEMO IV INJECTION 2 MG/ML
25.0000 mg/m2 | Freq: Once | INTRAVENOUS | Status: AC
  Administered 2012-06-25: 50 mg via INTRAVENOUS
  Filled 2012-06-25: qty 25

## 2012-06-25 MED ORDER — SODIUM CHLORIDE 0.9 % IV SOLN
10.0000 [IU]/m2 | Freq: Once | INTRAVENOUS | Status: AC
  Administered 2012-06-25: 20 [IU] via INTRAVENOUS
  Filled 2012-06-25: qty 6.7

## 2012-06-25 MED ORDER — SODIUM CHLORIDE 0.9 % IJ SOLN
10.0000 mL | INTRAMUSCULAR | Status: DC | PRN
  Filled 2012-06-25: qty 10

## 2012-06-25 MED ORDER — ALLOPURINOL 300 MG PO TABS: 300.0000 mg | ORAL_TABLET | Freq: Every day | ORAL | Status: DC

## 2012-06-25 MED ORDER — HEPARIN SOD (PORK) LOCK FLUSH 100 UNIT/ML IV SOLN
500.0000 [IU] | Freq: Once | INTRAVENOUS | Status: AC | PRN
  Administered 2012-06-25: 500 [IU]
  Filled 2012-06-25: qty 5

## 2012-06-25 NOTE — Progress Notes (Signed)
Problem number 1 stage II A. bulky Hodgkin's lymphoma, nodular sclerosing type. She has a large anterior mediastinal mass with left cervical and right cervical lymph nodes. She is status post 3 cycles of ABVD with excellent tolerance. Her PET scan done just the other day shows persistence of disease however in the mediastinum but with tremendous shrinkage of the mass and no activity elsewhere. She is asymptomatic. Specifically she has no B. symptoms. She still working certainly part time.  Her 2-D echo the other day employed function studies were excellent. She remains weak and tired and fatigues easily when walking but I suspect this is more deconditioning than anything else. I encourage her to keep using her elliptical machine when she cannot get out in the good weather. Her exam shows stable vital signs. She has no cervical, supraclavicular, infraclavicular adenopathy. Her lungs are clear to auscultation. Port-A-Cath is intact. Her heart shows a regular rhythm and rate without murmur rub or gallop. She has no leg edema. She is in no acute distress. She still retains approximately 10-15% of her hair my opinion. We will need to give her 2 cycles more of ABVD and repeat her PET scan. It needs to be negative at that time as far as uptake is concerned. The mass has shrunk as well considerably. We will also check her 2-D echo, bone function studies, and the PET scan after cycle 5. She knows that we will probably need 7 cycles of therapy. This of course depends upon complete resolution of metabolic activity on the PET scan after cycle 5

## 2012-06-25 NOTE — Patient Instructions (Addendum)
[facility] Discharge Instructions  RECOMMENDATIONS MADE BY THE CONSULTANT AND ANY TEST RESULTS WILL BE SENT TO YOUR REFERRING PHYSICIAN.  EXAM FINDINGS BY THE PHYSICIAN TODAY AND SIGNS OR SYMPTOMS TO REPORT TO CLINIC OR PRIMARY PHYSICIAN: Exam and discussion by MD.  PET did not show complete remission so will need to do 7 cycles of chemotherapy. Report fevers, uncontrolled nausea or vomiting or other problems. MEDICATIONS PRESCRIBED:  none  INSTRUCTIONS GIVEN AND DISCUSSED: PET scan, 2D ECHO and Pulmonary Function Tests after cycle 5 of chemotherapy.  SPECIAL INSTRUCTIONS/FOLLOW-UP: See Schedule for appointments.  Thank you for choosing [name] to provide your oncology and hematology care.  To afford each patient quality time with our providers, please arrive at least 15 minutes before your scheduled appointment time.  With your help, our goal is to use those 15 minutes to complete the necessary work-up to ensure our physicians have the information they need to help with your evaluation and healthcare recommendations.    Effective January 1st, 2014, we ask that you re-schedule your appointment with our physicians should you arrive 10 or more minutes late for your appointment.  We strive to give you quality time with our providers, and arriving late affects you and other patients whose appointments are after yours.    Again, thank you for choosing [facility].  Our hope is that these requests will decrease the amount of time that you wait before being seen by our physicians.       _____________________________________________________________  Should you have questions after your visit to [facility], please contact our office at [phone] between the hours of 8:30 a.m. and 5:00 p.m.  Voicemails left after 4:30 p.m. will not be returned until the following business day.  For prescription refill requests, have your pharmacy  contact our office with your prescription refill request.

## 2012-06-25 NOTE — Addendum Note (Signed)
Addended by: Evelena Leyden on: 06/25/2012 05:34 PM   Modules accepted: Orders

## 2012-06-26 ENCOUNTER — Encounter (HOSPITAL_BASED_OUTPATIENT_CLINIC_OR_DEPARTMENT_OTHER): Payer: BC Managed Care – PPO

## 2012-06-26 VITALS — BP 120/72 | HR 88

## 2012-06-26 DIAGNOSIS — C8190 Hodgkin lymphoma, unspecified, unspecified site: Secondary | ICD-10-CM

## 2012-06-26 DIAGNOSIS — C8119 Nodular sclerosis classical Hodgkin lymphoma, extranodal and solid organ sites: Secondary | ICD-10-CM

## 2012-06-26 DIAGNOSIS — Z5189 Encounter for other specified aftercare: Secondary | ICD-10-CM

## 2012-06-26 MED ORDER — PEGFILGRASTIM INJECTION 6 MG/0.6ML
SUBCUTANEOUS | Status: AC
  Filled 2012-06-26: qty 0.6

## 2012-06-26 MED ORDER — PEGFILGRASTIM INJECTION 6 MG/0.6ML
6.0000 mg | Freq: Once | SUBCUTANEOUS | Status: AC
  Administered 2012-06-26: 6 mg via SUBCUTANEOUS

## 2012-06-26 NOTE — Progress Notes (Signed)
[name] [name] presents today for injection per MD orders. Neulasta 6mg  administered SQ in right Abdomen. Administration without incident. Patient tolerated well.

## 2012-06-27 LAB — PULMONARY FUNCTION TEST

## 2012-07-10 ENCOUNTER — Encounter (HOSPITAL_BASED_OUTPATIENT_CLINIC_OR_DEPARTMENT_OTHER): Payer: BC Managed Care – PPO

## 2012-07-10 VITALS — BP 151/76 | HR 95 | Temp 98.5°F | Resp 18

## 2012-07-10 DIAGNOSIS — Z5111 Encounter for antineoplastic chemotherapy: Secondary | ICD-10-CM

## 2012-07-10 DIAGNOSIS — C8119 Nodular sclerosis classical Hodgkin lymphoma, extranodal and solid organ sites: Secondary | ICD-10-CM

## 2012-07-10 DIAGNOSIS — C8190 Hodgkin lymphoma, unspecified, unspecified site: Secondary | ICD-10-CM

## 2012-07-10 LAB — CBC WITH DIFFERENTIAL/PLATELET
Basophils Absolute: 0 10*3/uL (ref 0.0–0.1)
Basophils Relative: 1 % (ref 0–1)
Eosinophils Absolute: 0.2 10*3/uL (ref 0.0–0.7)
Eosinophils Relative: 2 % (ref 0–5)
HCT: 32.3 % — ABNORMAL LOW (ref 36.0–46.0)
Hemoglobin: 11.2 g/dL — ABNORMAL LOW (ref 12.0–15.0)
Lymphocytes Relative: 17 % (ref 12–46)
Lymphs Abs: 1.4 10*3/uL (ref 0.7–4.0)
MCH: 32.6 pg (ref 26.0–34.0)
MCHC: 34.7 g/dL (ref 30.0–36.0)
MCV: 93.9 fL (ref 78.0–100.0)
Monocytes Absolute: 0.7 10*3/uL (ref 0.1–1.0)
Monocytes Relative: 8 % (ref 3–12)
Neutro Abs: 6.4 10*3/uL (ref 1.7–7.7)
Neutrophils Relative %: 73 % (ref 43–77)
Platelets: 222 10*3/uL (ref 150–400)
RBC: 3.44 MIL/uL — ABNORMAL LOW (ref 3.87–5.11)
RDW: 15 % (ref 11.5–15.5)
WBC: 8.7 10*3/uL (ref 4.0–10.5)

## 2012-07-10 LAB — COMPREHENSIVE METABOLIC PANEL
ALT: 15 U/L (ref 0–35)
AST: 17 U/L (ref 0–37)
Albumin: 3.4 g/dL — ABNORMAL LOW (ref 3.5–5.2)
Alkaline Phosphatase: 72 U/L (ref 39–117)
BUN: 12 mg/dL (ref 6–23)
CO2: 27 mEq/L (ref 19–32)
Calcium: 9.5 mg/dL (ref 8.4–10.5)
Chloride: 101 mEq/L (ref 96–112)
Creatinine, Ser: 0.7 mg/dL (ref 0.50–1.10)
GFR calc Af Amer: >90 mL/min (ref >90)
GFR calc non Af Amer: >90 mL/min (ref >90)
Glucose, Bld: 113 mg/dL — ABNORMAL HIGH (ref 70–99)
Potassium: 3.7 mEq/L (ref 3.5–5.1)
Sodium: 136 mEq/L (ref 135–145)
Total Bilirubin: 0.3 mg/dL (ref 0.3–1.2)
Total Protein: 6.6 g/dL (ref 6.0–8.3)

## 2012-07-10 LAB — LACTATE DEHYDROGENASE: LDH: 201 U/L (ref 94–250)

## 2012-07-10 LAB — SEDIMENTATION RATE: Sed Rate: 16 mm/hr (ref 0–22)

## 2012-07-10 MED ORDER — SODIUM CHLORIDE 0.9 % IV SOLN
10.0000 [IU]/m2 | Freq: Once | INTRAVENOUS | Status: AC
  Administered 2012-07-10: 20 [IU] via INTRAVENOUS
  Filled 2012-07-10: qty 6.7

## 2012-07-10 MED ORDER — VINBLASTINE SULFATE CHEMO INJECTION 1 MG/ML
6.0000 mg/m2 | Freq: Once | INTRAVENOUS | Status: AC
  Administered 2012-07-10: 12.2 mg via INTRAVENOUS
  Filled 2012-07-10: qty 12.2

## 2012-07-10 MED ORDER — SODIUM CHLORIDE 0.9 % IV SOLN
Freq: Once | INTRAVENOUS | Status: AC
  Administered 2012-07-10: 10:00:00 via INTRAVENOUS

## 2012-07-10 MED ORDER — SODIUM CHLORIDE 0.9 % IV SOLN
375.0000 mg/m2 | Freq: Once | INTRAVENOUS | Status: AC
  Administered 2012-07-10: 760 mg via INTRAVENOUS
  Filled 2012-07-10: qty 38

## 2012-07-10 MED ORDER — HEPARIN SOD (PORK) LOCK FLUSH 100 UNIT/ML IV SOLN
500.0000 [IU] | Freq: Once | INTRAVENOUS | Status: AC | PRN
  Administered 2012-07-10: 500 [IU]
  Filled 2012-07-10: qty 5

## 2012-07-10 MED ORDER — DOXORUBICIN HCL CHEMO IV INJECTION 2 MG/ML
25.0000 mg/m2 | Freq: Once | INTRAVENOUS | Status: AC
  Administered 2012-07-10: 50 mg via INTRAVENOUS
  Filled 2012-07-10: qty 25

## 2012-07-10 MED ORDER — HEPARIN SOD (PORK) LOCK FLUSH 100 UNIT/ML IV SOLN
INTRAVENOUS | Status: AC
  Filled 2012-07-10: qty 5

## 2012-07-10 MED ORDER — SODIUM CHLORIDE 0.9 % IV SOLN
Freq: Once | INTRAVENOUS | Status: AC
  Administered 2012-07-10: 16 mg via INTRAVENOUS
  Filled 2012-07-10: qty 8

## 2012-07-10 MED ORDER — SODIUM CHLORIDE 0.9 % IJ SOLN
10.0000 mL | INTRAMUSCULAR | Status: DC | PRN
  Administered 2012-07-10: 10 mL
  Filled 2012-07-10: qty 10

## 2012-07-10 NOTE — Progress Notes (Signed)
Tolerated well

## 2012-07-11 ENCOUNTER — Encounter (HOSPITAL_BASED_OUTPATIENT_CLINIC_OR_DEPARTMENT_OTHER): Payer: BC Managed Care – PPO

## 2012-07-11 VITALS — BP 155/91 | HR 87 | Temp 98.1°F | Resp 18

## 2012-07-11 DIAGNOSIS — C8119 Nodular sclerosis classical Hodgkin lymphoma, extranodal and solid organ sites: Secondary | ICD-10-CM

## 2012-07-11 DIAGNOSIS — C8190 Hodgkin lymphoma, unspecified, unspecified site: Secondary | ICD-10-CM

## 2012-07-11 DIAGNOSIS — Z5189 Encounter for other specified aftercare: Secondary | ICD-10-CM

## 2012-07-11 MED ORDER — PEGFILGRASTIM INJECTION 6 MG/0.6ML
SUBCUTANEOUS | Status: AC
  Filled 2012-07-11: qty 0.6

## 2012-07-11 MED ORDER — PEGFILGRASTIM INJECTION 6 MG/0.6ML
6.0000 mg | Freq: Once | SUBCUTANEOUS | Status: AC
  Administered 2012-07-11: 6 mg via SUBCUTANEOUS

## 2012-07-11 NOTE — Progress Notes (Signed)
Holland Falling presents today for injection per MD orders. Neulasta 6mg  administered SQ in right Abdomen. Administration without incident. Patient tolerated well.

## 2012-07-23 ENCOUNTER — Other Ambulatory Visit (HOSPITAL_COMMUNITY): Payer: Self-pay | Admitting: Oncology

## 2012-07-23 ENCOUNTER — Encounter (HOSPITAL_COMMUNITY): Payer: BC Managed Care – PPO | Attending: Oncology

## 2012-07-23 DIAGNOSIS — C8119 Nodular sclerosis classical Hodgkin lymphoma, extranodal and solid organ sites: Secondary | ICD-10-CM

## 2012-07-23 DIAGNOSIS — C8190 Hodgkin lymphoma, unspecified, unspecified site: Secondary | ICD-10-CM | POA: Insufficient documentation

## 2012-07-23 LAB — CBC WITH DIFFERENTIAL/PLATELET
Basophils Absolute: 0 10*3/uL (ref 0.0–0.1)
Basophils Relative: 0 % (ref 0–1)
Eosinophils Absolute: 0.3 10*3/uL (ref 0.0–0.7)
Eosinophils Relative: 3 % (ref 0–5)
HCT: 33 % — ABNORMAL LOW (ref 36.0–46.0)
Hemoglobin: 11.2 g/dL — ABNORMAL LOW (ref 12.0–15.0)
Lymphocytes Relative: 14 % (ref 12–46)
Lymphs Abs: 1.6 10*3/uL (ref 0.7–4.0)
MCH: 32.1 pg (ref 26.0–34.0)
MCHC: 33.9 g/dL (ref 30.0–36.0)
MCV: 94.6 fL (ref 78.0–100.0)
Monocytes Absolute: 0.7 10*3/uL (ref 0.1–1.0)
Monocytes Relative: 6 % (ref 3–12)
Neutro Abs: 8.6 10*3/uL — ABNORMAL HIGH (ref 1.7–7.7)
Neutrophils Relative %: 77 % (ref 43–77)
Platelets: 223 10*3/uL (ref 150–400)
RBC: 3.49 MIL/uL — ABNORMAL LOW (ref 3.87–5.11)
RDW: 14.7 % (ref 11.5–15.5)
WBC: 11.2 10*3/uL — ABNORMAL HIGH (ref 4.0–10.5)

## 2012-07-23 LAB — COMPREHENSIVE METABOLIC PANEL
ALT: 19 U/L (ref 0–35)
AST: 25 U/L (ref 0–37)
Albumin: 3.6 g/dL (ref 3.5–5.2)
Alkaline Phosphatase: 73 U/L (ref 39–117)
BUN: 16 mg/dL (ref 6–23)
CO2: 28 mEq/L (ref 19–32)
Calcium: 9.5 mg/dL (ref 8.4–10.5)
Chloride: 100 mEq/L (ref 96–112)
Creatinine, Ser: 0.73 mg/dL (ref 0.50–1.10)
GFR calc Af Amer: >90 mL/min (ref >90)
GFR calc non Af Amer: >90 mL/min (ref >90)
Glucose, Bld: 111 mg/dL — ABNORMAL HIGH (ref 70–99)
Potassium: 3.6 mEq/L (ref 3.5–5.1)
Sodium: 138 mEq/L (ref 135–145)
Total Bilirubin: 0.3 mg/dL (ref 0.3–1.2)
Total Protein: 6.9 g/dL (ref 6.0–8.3)

## 2012-07-23 NOTE — Progress Notes (Signed)
Holland Falling presented for labwork. Labs per MD order drawn via Peripheral Line 23 gauge needle inserted in left antecubital.  Good blood return present. Procedure without incident.  Needle removed intact. Patient tolerated procedure well.

## 2012-07-24 ENCOUNTER — Encounter (HOSPITAL_BASED_OUTPATIENT_CLINIC_OR_DEPARTMENT_OTHER): Payer: BC Managed Care – PPO

## 2012-07-24 VITALS — BP 135/77 | HR 98 | Temp 98.1°F | Resp 18

## 2012-07-24 DIAGNOSIS — C8119 Nodular sclerosis classical Hodgkin lymphoma, extranodal and solid organ sites: Secondary | ICD-10-CM

## 2012-07-24 DIAGNOSIS — Z5111 Encounter for antineoplastic chemotherapy: Secondary | ICD-10-CM

## 2012-07-24 DIAGNOSIS — C8190 Hodgkin lymphoma, unspecified, unspecified site: Secondary | ICD-10-CM

## 2012-07-24 MED ORDER — DOXORUBICIN HCL CHEMO IV INJECTION 2 MG/ML
25.0000 mg/m2 | Freq: Once | INTRAVENOUS | Status: AC
  Administered 2012-07-24: 50 mg via INTRAVENOUS
  Filled 2012-07-24: qty 25

## 2012-07-24 MED ORDER — SODIUM CHLORIDE 0.9 % IJ SOLN
10.0000 mL | INTRAMUSCULAR | Status: DC | PRN
  Filled 2012-07-24: qty 10

## 2012-07-24 MED ORDER — SODIUM CHLORIDE 0.9 % IV SOLN
375.0000 mg/m2 | Freq: Once | INTRAVENOUS | Status: AC
  Administered 2012-07-24: 760 mg via INTRAVENOUS
  Filled 2012-07-24: qty 38

## 2012-07-24 MED ORDER — SODIUM CHLORIDE 0.9 % IV SOLN
10.0000 [IU]/m2 | Freq: Once | INTRAVENOUS | Status: AC
  Administered 2012-07-24: 20 [IU] via INTRAVENOUS
  Filled 2012-07-24: qty 6.7

## 2012-07-24 MED ORDER — HEPARIN SOD (PORK) LOCK FLUSH 100 UNIT/ML IV SOLN
500.0000 [IU] | Freq: Once | INTRAVENOUS | Status: AC | PRN
  Administered 2012-07-24: 500 [IU]
  Filled 2012-07-24: qty 5

## 2012-07-24 MED ORDER — VINBLASTINE SULFATE CHEMO INJECTION 1 MG/ML
6.0000 mg/m2 | Freq: Once | INTRAVENOUS | Status: AC
  Administered 2012-07-24: 12.2 mg via INTRAVENOUS
  Filled 2012-07-24: qty 12.2

## 2012-07-24 MED ORDER — ONDANSETRON HCL 40 MG/20ML IJ SOLN
Freq: Once | INTRAMUSCULAR | Status: AC
  Administered 2012-07-24: 16 mg via INTRAVENOUS
  Filled 2012-07-24: qty 8

## 2012-07-24 MED ORDER — HEPARIN SOD (PORK) LOCK FLUSH 100 UNIT/ML IV SOLN
INTRAVENOUS | Status: AC
  Filled 2012-07-24: qty 5

## 2012-07-24 MED ORDER — SODIUM CHLORIDE 0.9 % IV SOLN
Freq: Once | INTRAVENOUS | Status: AC
  Administered 2012-07-24: 09:00:00 via INTRAVENOUS

## 2012-07-24 NOTE — Progress Notes (Signed)
Tolerated well

## 2012-07-25 ENCOUNTER — Ambulatory Visit (HOSPITAL_COMMUNITY): Payer: BC Managed Care – PPO

## 2012-07-26 ENCOUNTER — Encounter (HOSPITAL_BASED_OUTPATIENT_CLINIC_OR_DEPARTMENT_OTHER): Payer: BC Managed Care – PPO

## 2012-07-26 VITALS — BP 148/86 | HR 109 | Temp 98.1°F | Resp 18

## 2012-07-26 DIAGNOSIS — C8190 Hodgkin lymphoma, unspecified, unspecified site: Secondary | ICD-10-CM

## 2012-07-26 DIAGNOSIS — Z5189 Encounter for other specified aftercare: Secondary | ICD-10-CM

## 2012-07-26 DIAGNOSIS — C8119 Nodular sclerosis classical Hodgkin lymphoma, extranodal and solid organ sites: Secondary | ICD-10-CM

## 2012-07-26 MED ORDER — PEGFILGRASTIM INJECTION 6 MG/0.6ML
6.0000 mg | Freq: Once | SUBCUTANEOUS | Status: AC
  Administered 2012-07-26: 6 mg via SUBCUTANEOUS

## 2012-07-26 MED ORDER — PEGFILGRASTIM INJECTION 6 MG/0.6ML
SUBCUTANEOUS | Status: AC
  Filled 2012-07-26: qty 0.6

## 2012-07-26 NOTE — Progress Notes (Signed)
Holland Falling presents today for injection per MD orders. Neulasta 6mg  administered SQ in left Abdomen. Administration without incident. Patient tolerated well.

## 2012-07-27 ENCOUNTER — Other Ambulatory Visit: Payer: Self-pay

## 2012-08-07 ENCOUNTER — Encounter (HOSPITAL_BASED_OUTPATIENT_CLINIC_OR_DEPARTMENT_OTHER): Payer: BC Managed Care – PPO

## 2012-08-07 ENCOUNTER — Other Ambulatory Visit (HOSPITAL_COMMUNITY): Payer: Self-pay | Admitting: Oncology

## 2012-08-07 VITALS — BP 168/78 | HR 108 | Temp 98.0°F | Resp 18 | Wt 204.0 lb

## 2012-08-07 DIAGNOSIS — Z5111 Encounter for antineoplastic chemotherapy: Secondary | ICD-10-CM

## 2012-08-07 DIAGNOSIS — C8190 Hodgkin lymphoma, unspecified, unspecified site: Secondary | ICD-10-CM

## 2012-08-07 DIAGNOSIS — C8119 Nodular sclerosis classical Hodgkin lymphoma, extranodal and solid organ sites: Secondary | ICD-10-CM

## 2012-08-07 LAB — CBC WITH DIFFERENTIAL/PLATELET
Basophils Absolute: 0 10*3/uL (ref 0.0–0.1)
Basophils Relative: 0 % (ref 0–1)
Eosinophils Absolute: 0.2 10*3/uL (ref 0.0–0.7)
Eosinophils Relative: 2 % (ref 0–5)
HCT: 32 % — ABNORMAL LOW (ref 36.0–46.0)
Hemoglobin: 10.9 g/dL — ABNORMAL LOW (ref 12.0–15.0)
Lymphocytes Relative: 10 % — ABNORMAL LOW (ref 12–46)
Lymphs Abs: 1 10*3/uL (ref 0.7–4.0)
MCH: 32 pg (ref 26.0–34.0)
MCHC: 34.1 g/dL (ref 30.0–36.0)
MCV: 93.8 fL (ref 78.0–100.0)
Monocytes Absolute: 1.1 10*3/uL — ABNORMAL HIGH (ref 0.1–1.0)
Monocytes Relative: 10 % (ref 3–12)
Neutro Abs: 7.9 10*3/uL — ABNORMAL HIGH (ref 1.7–7.7)
Neutrophils Relative %: 78 % — ABNORMAL HIGH (ref 43–77)
Platelets: 174 10*3/uL (ref 150–400)
RBC: 3.41 MIL/uL — ABNORMAL LOW (ref 3.87–5.11)
RDW: 14.1 % (ref 11.5–15.5)
WBC: 10.1 10*3/uL (ref 4.0–10.5)

## 2012-08-07 LAB — COMPREHENSIVE METABOLIC PANEL
ALT: 15 U/L (ref 0–35)
AST: 20 U/L (ref 0–37)
Albumin: 3.4 g/dL — ABNORMAL LOW (ref 3.5–5.2)
Alkaline Phosphatase: 87 U/L (ref 39–117)
BUN: 11 mg/dL (ref 6–23)
CO2: 26 mEq/L (ref 19–32)
Calcium: 9.3 mg/dL (ref 8.4–10.5)
Chloride: 101 mEq/L (ref 96–112)
Creatinine, Ser: 0.7 mg/dL (ref 0.50–1.10)
GFR calc Af Amer: >90 mL/min (ref >90)
GFR calc non Af Amer: >90 mL/min (ref >90)
Glucose, Bld: 111 mg/dL — ABNORMAL HIGH (ref 70–99)
Potassium: 3.7 mEq/L (ref 3.5–5.1)
Sodium: 135 mEq/L (ref 135–145)
Total Bilirubin: 0.2 mg/dL — ABNORMAL LOW (ref 0.3–1.2)
Total Protein: 6.6 g/dL (ref 6.0–8.3)

## 2012-08-07 LAB — SEDIMENTATION RATE: Sed Rate: 26 mm/hr — ABNORMAL HIGH (ref 0–22)

## 2012-08-07 LAB — LACTATE DEHYDROGENASE: LDH: 223 U/L (ref 94–250)

## 2012-08-07 MED ORDER — SODIUM CHLORIDE 0.9 % IV SOLN
Freq: Once | INTRAVENOUS | Status: AC
  Administered 2012-08-07: 16 mg via INTRAVENOUS
  Filled 2012-08-07: qty 8

## 2012-08-07 MED ORDER — VINBLASTINE SULFATE CHEMO INJECTION 1 MG/ML
6.0000 mg/m2 | Freq: Once | INTRAVENOUS | Status: AC
  Administered 2012-08-07: 12.2 mg via INTRAVENOUS
  Filled 2012-08-07: qty 12.2

## 2012-08-07 MED ORDER — HEPARIN SOD (PORK) LOCK FLUSH 100 UNIT/ML IV SOLN
500.0000 [IU] | Freq: Once | INTRAVENOUS | Status: AC | PRN
  Administered 2012-08-07: 500 [IU]
  Filled 2012-08-07: qty 5

## 2012-08-07 MED ORDER — SODIUM CHLORIDE 0.9 % IV SOLN
Freq: Once | INTRAVENOUS | Status: AC
  Administered 2012-08-07: 09:00:00 via INTRAVENOUS

## 2012-08-07 MED ORDER — SODIUM CHLORIDE 0.9 % IV SOLN
10.0000 [IU]/m2 | Freq: Once | INTRAVENOUS | Status: AC
  Administered 2012-08-07: 20 [IU] via INTRAVENOUS
  Filled 2012-08-07: qty 6.7

## 2012-08-07 MED ORDER — DOXORUBICIN HCL CHEMO IV INJECTION 2 MG/ML
25.0000 mg/m2 | Freq: Once | INTRAVENOUS | Status: AC
  Administered 2012-08-07: 50 mg via INTRAVENOUS
  Filled 2012-08-07: qty 25

## 2012-08-07 MED ORDER — SODIUM CHLORIDE 0.9 % IV SOLN
375.0000 mg/m2 | Freq: Once | INTRAVENOUS | Status: AC
  Administered 2012-08-07: 760 mg via INTRAVENOUS
  Filled 2012-08-07: qty 38

## 2012-08-07 NOTE — Progress Notes (Signed)
Tolerated well

## 2012-08-08 ENCOUNTER — Encounter (HOSPITAL_BASED_OUTPATIENT_CLINIC_OR_DEPARTMENT_OTHER): Payer: BC Managed Care – PPO

## 2012-08-08 VITALS — Temp 97.8°F

## 2012-08-08 DIAGNOSIS — C8190 Hodgkin lymphoma, unspecified, unspecified site: Secondary | ICD-10-CM

## 2012-08-08 DIAGNOSIS — C8119 Nodular sclerosis classical Hodgkin lymphoma, extranodal and solid organ sites: Secondary | ICD-10-CM

## 2012-08-08 DIAGNOSIS — Z5189 Encounter for other specified aftercare: Secondary | ICD-10-CM

## 2012-08-08 MED ORDER — PEGFILGRASTIM INJECTION 6 MG/0.6ML
6.0000 mg | Freq: Once | SUBCUTANEOUS | Status: AC
  Administered 2012-08-08: 6 mg via SUBCUTANEOUS

## 2012-08-08 MED ORDER — PEGFILGRASTIM INJECTION 6 MG/0.6ML
SUBCUTANEOUS | Status: AC
  Filled 2012-08-08: qty 0.6

## 2012-08-08 NOTE — Progress Notes (Signed)
[name] [name] presents today for injection per the provider's orders.  Neulasta administered administration without incident; see MAR for injection details.  Patient tolerated procedure well and without incident.  No questions or complaints noted at this time.

## 2012-08-12 ENCOUNTER — Other Ambulatory Visit (HOSPITAL_COMMUNITY): Payer: BC Managed Care – PPO

## 2012-08-13 ENCOUNTER — Ambulatory Visit (HOSPITAL_COMMUNITY)
Admission: RE | Admit: 2012-08-13 | Discharge: 2012-08-13 | Disposition: A | Discharge status: Home / Self Care | Payer: BC Managed Care – PPO | Source: Ambulatory Visit | Attending: Oncology | Admitting: Oncology

## 2012-08-13 DIAGNOSIS — R0989 Other specified symptoms and signs involving the circulatory and respiratory systems: Secondary | ICD-10-CM | POA: Insufficient documentation

## 2012-08-13 DIAGNOSIS — R0609 Other forms of dyspnea: Secondary | ICD-10-CM | POA: Insufficient documentation

## 2012-08-13 DIAGNOSIS — C8179 Other classical Hodgkin lymphoma, extranodal and solid organ sites: Secondary | ICD-10-CM | POA: Insufficient documentation

## 2012-08-13 DIAGNOSIS — R0602 Shortness of breath: Secondary | ICD-10-CM

## 2012-08-13 DIAGNOSIS — Z9221 Personal history of antineoplastic chemotherapy: Secondary | ICD-10-CM | POA: Insufficient documentation

## 2012-08-13 DIAGNOSIS — I10 Essential (primary) hypertension: Secondary | ICD-10-CM | POA: Insufficient documentation

## 2012-08-13 DIAGNOSIS — C8190 Hodgkin lymphoma, unspecified, unspecified site: Secondary | ICD-10-CM | POA: Insufficient documentation

## 2012-08-13 NOTE — Progress Notes (Signed)
*  PRELIMINARY RESULTS* Echocardiogram 2D Echocardiogram has been performed.  [name] [name] 08/13/2012, 11:38 AM

## 2012-08-16 ENCOUNTER — Encounter (HOSPITAL_COMMUNITY)
Admission: RE | Admit: 2012-08-16 | Discharge: 2012-08-16 | Disposition: A | Discharge status: Home / Self Care | Payer: BC Managed Care – PPO | Source: Ambulatory Visit | Attending: Oncology | Admitting: Oncology

## 2012-08-16 ENCOUNTER — Encounter (HOSPITAL_COMMUNITY): Payer: Self-pay

## 2012-08-16 DIAGNOSIS — C8190 Hodgkin lymphoma, unspecified, unspecified site: Secondary | ICD-10-CM | POA: Insufficient documentation

## 2012-08-16 LAB — GLUCOSE, CAPILLARY: Glucose-Capillary: 101 mg/dL — ABNORMAL HIGH (ref 70–99)

## 2012-08-16 MED ORDER — FLUDEOXYGLUCOSE F - 18 (FDG) INJECTION
17.9000 | Freq: Once | INTRAVENOUS | Status: AC | PRN
  Administered 2012-08-16: 17.9 via INTRAVENOUS

## 2012-08-19 ENCOUNTER — Telehealth (HOSPITAL_COMMUNITY): Payer: Self-pay

## 2012-08-19 ENCOUNTER — Telehealth (HOSPITAL_COMMUNITY): Payer: Self-pay | Admitting: Oncology

## 2012-08-19 ENCOUNTER — Ambulatory Visit (HOSPITAL_COMMUNITY): Payer: BC Managed Care – PPO | Admitting: Oncology

## 2012-08-19 NOTE — Telephone Encounter (Signed)
Call from patient requesting that someone call her with her test results - ECHO and PET scan.  Can be reached @ [phone].

## 2012-08-19 NOTE — Telephone Encounter (Signed)
Patient did not answer and therefore the following information was left with the patient on a message:  Informed Chantae about her PFTs which are stable.  Informed patient about her 2 D echo and educated her about EF.  This too is stable.   PET scan will be reviewed with Radiologist and we will discuss in more detail tomorrow or the following day.  She was encouraged to call us back with any questions.   KEFALAS,THOMAS

## 2012-08-20 ENCOUNTER — Other Ambulatory Visit (HOSPITAL_COMMUNITY): Payer: Self-pay | Admitting: *Deleted

## 2012-08-20 DIAGNOSIS — Z95828 Presence of other vascular implants and grafts: Secondary | ICD-10-CM

## 2012-08-20 LAB — PULMONARY FUNCTION TEST

## 2012-08-20 NOTE — Progress Notes (Signed)
-  patient rescheduled to Dr. Napier Papas schedule today-

## 2012-08-20 NOTE — Procedures (Signed)
NAME:  Freilich, Martie             ACCOUNT NO.:  [id number]  MEDICAL RECORD NO.:  [id number]  LOCATION:  XRAY                         FACILITY:  [facility]  PHYSICIAN:  Dmoni Fortson L. Koleman Marling, M.D.DATE OF BIRTH:  04/03/1971  DATE OF PROCEDURE: DATE OF DISCHARGE:                           PULMONARY FUNCTION TEST   REASON FOR PULMONARY FUNCTION TESTING:  Hodgkin lymphoma.  1. Spirometry is normal. 2. Lung volumes are normal. 3. DLCO is slightly reduced, but corrects when volume is taken into     account.  Please note there is a mouth pressure error. 4. Airway resistance is slightly high. 5. This is essentially normal and the abnormalities on DLCO may be     related to the mouth pressure error.     Shine Mikes L. Aritza Brunet, M.D.     ELH/MEDQ  D:  08/19/2012  T:  08/20/2012  Job:  [id number]  cc:   Eric S. Neijstrom, MD Fax: [phone] 

## 2012-08-20 NOTE — Procedures (Deleted)
NAMEKAEDEN, DEPAZ NO.:  [id number]  MEDICAL RECORD NO.:  [id number]  LOCATION:  XRAY                         FACILITY:  [facility]  PHYSICIAN:  Edward L. Juanetta Gosling, M.D.DATE OF BIRTH:  05-19-1971  DATE OF PROCEDURE: DATE OF DISCHARGE:                           PULMONARY FUNCTION TEST   REASON FOR PULMONARY FUNCTION TESTING:  Hodgkin lymphoma.  1. Spirometry is normal. 2. Lung volumes are normal. 3. DLCO is slightly reduced, but corrects when volume is taken into     account.  Please note there is a mouth pressure error. 4. Airway resistance is slightly high. 5. This is essentially normal and the abnormalities on DLCO may be     related to the mouth pressure error.     Edward L. Juanetta Gosling, M.D.     ELH/MEDQ  D:  08/19/2012  T:  08/20/2012  Job:  [id number]  cc:   Ladona Horns. Mariel Sleet, MD Fax: [phone]

## 2012-08-21 ENCOUNTER — Encounter (HOSPITAL_COMMUNITY): Payer: BC Managed Care – PPO

## 2012-08-21 ENCOUNTER — Telehealth (HOSPITAL_COMMUNITY): Payer: Self-pay | Admitting: Oncology

## 2012-08-21 ENCOUNTER — Ambulatory Visit (HOSPITAL_COMMUNITY): Payer: BC Managed Care – PPO | Admitting: Oncology

## 2012-08-21 ENCOUNTER — Encounter (HOSPITAL_BASED_OUTPATIENT_CLINIC_OR_DEPARTMENT_OTHER): Payer: BC Managed Care – PPO | Admitting: Oncology

## 2012-08-21 ENCOUNTER — Other Ambulatory Visit (HOSPITAL_COMMUNITY): Payer: Self-pay | Admitting: Oncology

## 2012-08-21 ENCOUNTER — Encounter (HOSPITAL_COMMUNITY): Payer: BC Managed Care – PPO | Attending: Oncology

## 2012-08-21 ENCOUNTER — Ambulatory Visit (HOSPITAL_COMMUNITY)
Admission: RE | Admit: 2012-08-21 | Discharge: 2012-08-21 | Disposition: A | Discharge status: Home / Self Care | Payer: BC Managed Care – PPO | Source: Ambulatory Visit | Attending: Oncology | Admitting: Oncology

## 2012-08-21 DIAGNOSIS — C8190 Hodgkin lymphoma, unspecified, unspecified site: Secondary | ICD-10-CM | POA: Insufficient documentation

## 2012-08-21 DIAGNOSIS — C8119 Nodular sclerosis classical Hodgkin lymphoma, extranodal and solid organ sites: Secondary | ICD-10-CM

## 2012-08-21 DIAGNOSIS — Z452 Encounter for adjustment and management of vascular access device: Secondary | ICD-10-CM | POA: Insufficient documentation

## 2012-08-21 DIAGNOSIS — G4700 Insomnia, unspecified: Secondary | ICD-10-CM | POA: Insufficient documentation

## 2012-08-21 DIAGNOSIS — Z95828 Presence of other vascular implants and grafts: Secondary | ICD-10-CM

## 2012-08-21 DIAGNOSIS — Z9889 Other specified postprocedural states: Secondary | ICD-10-CM | POA: Insufficient documentation

## 2012-08-21 LAB — CBC WITH DIFFERENTIAL/PLATELET
Basophils Absolute: 0.1 10*3/uL (ref 0.0–0.1)
Basophils Relative: 1 % (ref 0–1)
Eosinophils Absolute: 0.1 10*3/uL (ref 0.0–0.7)
Eosinophils Relative: 2 % (ref 0–5)
HCT: 31.4 % — ABNORMAL LOW (ref 36.0–46.0)
Hemoglobin: 10.9 g/dL — ABNORMAL LOW (ref 12.0–15.0)
Lymphocytes Relative: 17 % (ref 12–46)
Lymphs Abs: 1.1 10*3/uL (ref 0.7–4.0)
MCH: 32.9 pg (ref 26.0–34.0)
MCHC: 34.7 g/dL (ref 30.0–36.0)
MCV: 94.9 fL (ref 78.0–100.0)
Monocytes Absolute: 0.9 10*3/uL (ref 0.1–1.0)
Monocytes Relative: 13 % — ABNORMAL HIGH (ref 3–12)
Neutro Abs: 4.5 10*3/uL (ref 1.7–7.7)
Neutrophils Relative %: 68 % (ref 43–77)
Platelets: 211 10*3/uL (ref 150–400)
RBC: 3.31 MIL/uL — ABNORMAL LOW (ref 3.87–5.11)
RDW: 14.6 % (ref 11.5–15.5)
WBC: 6.7 10*3/uL (ref 4.0–10.5)

## 2012-08-21 LAB — COMPREHENSIVE METABOLIC PANEL
ALT: 15 U/L (ref 0–35)
AST: 18 U/L (ref 0–37)
Albumin: 3.5 g/dL (ref 3.5–5.2)
Alkaline Phosphatase: 71 U/L (ref 39–117)
BUN: 16 mg/dL (ref 6–23)
CO2: 25 mEq/L (ref 19–32)
Calcium: 9.3 mg/dL (ref 8.4–10.5)
Chloride: 104 mEq/L (ref 96–112)
Creatinine, Ser: 0.71 mg/dL (ref 0.50–1.10)
GFR calc Af Amer: >90 mL/min (ref >90)
GFR calc non Af Amer: >90 mL/min (ref >90)
Glucose, Bld: 123 mg/dL — ABNORMAL HIGH (ref 70–99)
Potassium: 3.8 mEq/L (ref 3.5–5.1)
Sodium: 139 mEq/L (ref 135–145)
Total Bilirubin: 0.2 mg/dL — ABNORMAL LOW (ref 0.3–1.2)
Total Protein: 6.5 g/dL (ref 6.0–8.3)

## 2012-08-21 LAB — LACTATE DEHYDROGENASE: LDH: 218 U/L (ref 94–250)

## 2012-08-21 LAB — SEDIMENTATION RATE: Sed Rate: 18 mm/hr (ref 0–22)

## 2012-08-21 MED ORDER — ALTEPLASE 2 MG IJ SOLR
INTRAMUSCULAR | Status: AC
  Filled 2012-08-21: qty 2

## 2012-08-21 MED ORDER — ALPRAZOLAM 1 MG PO TABS: 1.0000 mg | ORAL_TABLET | Freq: Three times a day (TID) | ORAL | Status: DC | PRN

## 2012-08-21 MED ORDER — ALTEPLASE 2 MG IJ SOLR
2.0000 mg | Freq: Once | INTRAMUSCULAR | Status: AC
  Administered 2012-08-21: 2 mg
  Filled 2012-08-21: qty 2

## 2012-08-21 MED ORDER — IOHEXOL 300 MG/ML  SOLN
50.0000 mL | Freq: Once | INTRAMUSCULAR | Status: AC | PRN
  Administered 2012-08-21: 40 mL via INTRAVENOUS

## 2012-08-21 MED ORDER — PROCHLORPERAZINE MALEATE 10 MG PO TABS: 10.0000 mg | ORAL_TABLET | Freq: Four times a day (QID) | ORAL | Status: DC | PRN

## 2012-08-21 MED ORDER — ALPRAZOLAM 1 MG PO TABS: ORAL_TABLET | ORAL | Status: DC

## 2012-08-21 NOTE — Progress Notes (Signed)
Holland Falling presented for Portacath access and dye study. Blood return sluggish. Proper placement of portacath confirmed by fluoro.  Portacath located lt chest wall accessed with  H 20 needle. Portacath flushed with 20ml NS and 500U/55ml Heparin and needle removed intact. Procedure without incident. Dr. Mariel Sleet in to discuss new chemo regimen and prescriptions given to patient. Planning on treatment on Monday 3/17.

## 2012-08-21 NOTE — Patient Instructions (Addendum)
[facility]   CHEMOTHERAPY INSTRUCTIONS  Procarbazine 50mg  capsules. You will take 4 capsules daily on days 1-7 of chemo. Refer to your calendar. You will receive this medication in the mail from a specialty pharmacy. Side Effects: bone marrow suppression (lowers white blood cells, red blood cells, platelets), nausea/vomiting. You should avoid foods high in tyramine, such as aged cheeses, air-dried or cured meats, fava or broad bean pods, tap/draft beer, wine (over 4 oz), vermouth, marmite concentrate, sauerkraut, and soy sauce and other soybean condiments because procarbazine inhibits monoamine oxidase. Patients should also avoid alcohol for possible Antabuse-like reaction (severe copius vomiting, bad headache, blurred vision, etc., etc., etc). You will need to bring this medication with you to the hospital because the hospital will use your own supply of this drug.   Prednisone 80mg . You will take 80mg  of Prednisone daily on days 1-14 of your chemo cycle. Refer to your calendar. On day 2 and day 3 of your chemo cycle the hospital will administer its own supply of Prednisone to you.   (Day 1 chemo drug) Cytoxan is a chemotherapy drug. Nausea/vomiting, hair loss, hemorrhagic cystitis (bloody urine) because the chemo irritates your bladder. You need to push fluids when on this chemo, preferably water(64 oz a day). Do not hold your urine. Urinate before you go to bed and if you wake up during the night go to the bathroom.  (Day 1 chemo drug) Adriamycin - bone marrow suppression, nausea, vomiting, hair loss, mouth sores, cardiotoxicity (this is why we do the 2D Echoes), sensitivity to light, will turn urine red for a couple to a few voids then it should start clearing back to a yellow color.  (Day 1, 2, 3 chemo drug) VP16 (Etoposide) - this medication can lower your blood pressure. We will monitor this during chemo. Bone marrow suppression (lowers white blood cells (fight  infection), lowers red blood cells (make up your blood), lowers platelets (help blood to clot). Hair loss, loss of appetite.  (Day 8 chemo drug) Vincristine - peripheral neuropathy - numbness/tingling/burning in hands/fingers/feet/toes. Let us know if this develops. Hair loss, constipation, jaw pain  (Day 8 chemo drug) Bleomycin - hair loss, kidney toxicity, pulmonary fibrosis, fever, chills, hypersensitivity or anaphylactic reaction (rare). Patients with lymphoma have a higher incidence of anaphylaxis after receiving bleomycin than do other patients who receive the drug. Patients who have received bleomycin are at risk for pulmonary toxicity when exposed to oxygen during surgery. You must always -for the rest of your life- disclose to your surgeon/anesthesiologist that you have taken bleomycin to prevent a fatal episode of pulmonary failure. The first sign of pulmonary toxicity is a cough.   Day 8 through Day 14 NEUPOGEN - this is to increase your white blood cell count in your body.   You will continue to take Allopurinol.  You have been given a prescription of Xanax 1mg  with the instructions of taking 1/2 to 1 tablet four times a day IF needed for anxiety. We are expecting you to feel nervous from taking such a high dose of steroids for such an extended amount of time.  Compazine 25mg  suppository. Insert 1 rectally every 6 hours IF needed for nausea/vomiting.   *Do not start your Zofran until Thursday 08/29/12. You will do this with each chemo cycle. Because of the premeds we give you, you can not take Zofran until the 3rd day after chemo (initial 1st treatment day). For you, this means the first day  of chemo is on Monday so you do not want to take the Zofran until Thursday. *  Stay away from the Ativan for the present time. Call us if you are experiencing nausea not controlled by the zofran/compazine suppositories. Since you have the ability to take Xanax four times a day we don't want to have you  taking Ativan as often as you once was. Try not to take it at all. If you have to take it, it must be at least 2 hours apart from the Xanax.   EDUCATIONAL MATERIALS GIVEN AND REVIEWED: Chemo/med calendar Instruction sheets on Adriamycin, Cytoxan, vp16, Bleomycin, Vincristine, Prednisone, Procarbazine, Aloxi, Emend, Dexamethasone, Neupogen   SELF CARE ACTIVITIES WHILE ON CHEMOTHERAPY: Increase your fluid intake 48 hours prior to treatment and drink at least 2 quarts per day after treatment., No alcohol intake., No aspirin or other medications unless approved by your oncologist., Eat foods that are light and easy to digest., Eat foods at cold or room temperature., No fried, fatty, or spicy foods immediately before or after treatment., Have teeth cleaned professionally before starting treatment. Keep dentures and partial plates clean., Use soft toothbrush and do not use mouthwashes that contain alcohol. Biotene is a good mouthwash that is available at most pharmacies or may be ordered by calling [phone]., Use warm salt water gargles (1 teaspoon salt per 1 quart warm water) before and after meals and at bedtime. Or you may rinse with 2 tablespoons of three -percent hydrogen peroxide mixed in eight ounces of water., Always use sunscreen with SPF (Sun Protection Factor) of 30 or higher. and Use your nausea medication as directed to prevent nausea.   Please wash your hands for at least 30 seconds using warm soapy water. Handwashing is the #1 way to prevent the spread of germs. Stay away from sick people or people who are getting over a cold. If you develop respiratory systems such as green/yellow mucus production or productive cough or persistent cough let us know and we will see if you need an antibiotic. It is a good idea to keep a pair of gloves on when going into grocery stores/[employer] to decrease your risk of coming into contact with germs on the carts, etc. Carry alcohol hand gel with you at all  times and use it frequently if out in public. All foods need to be cooked thoroughly. No raw foods. No medium or undercooked meats, eggs. If your food is cooked medium well, it does not need to be hot pink or saturated with bloody liquid at all. Vegetables and fruits need to be washed/rinsed under the faucet with a dish detergent before being consumed. You can eat raw fruits and vegetables unless we tell you otherwise but it would be best if you cooked them or bought frozen. Do not eat off of salad bars or hot bars unless you really trust the cleanliness of the restaurant. If you need dental work, please let Dr. Mariel Sleet know before you go for your appointment so that we can coordinate the best possible time for you in regards to your chemo regimen. You need to also let your dentist know that you are actively taking chemo. We may need to do labs prior to your dental appointment. We also want your bowels moving at least every other day. If this is not happening, we need to know so that we can get you on a bowel regimen to help you go.     SYMPTOMS TO REPORT AS SOON AS  POSSIBLE AFTER TREATMENT:  FEVER GREATER THAN 100.5 F  CHILLS WITH OR WITHOUT FEVER  NAUSEA AND VOMITING THAT IS NOT CONTROLLED WITH YOUR NAUSEA MEDICATION  UNUSUAL SHORTNESS OF BREATH  UNUSUAL BRUISING OR BLEEDING  TENDERNESS IN MOUTH AND THROAT WITH OR WITHOUT PRESENCE OF ULCERS  URINARY PROBLEMS  BOWEL PROBLEMS  UNUSUAL RASH    Wear comfortable clothing and clothing appropriate for easy access to any Portacath or PICC line. Let us know if there is anything that we can do to make your therapy better!      I have been informed and understand all of the instructions given to me and have received a copy. I have been instructed to call the clinic [phone] or my family physician as soon as possible for continued medical care, if indicated. I do not have any more questions at this time but understand that I may call the  Cancer Center or the Patient Navigator at [phone] during office hours should I have questions or need assistance in obtaining follow-up care.      _________________________________________      _______________     __________ Signature of Patient or Authorized Representative        Date                            Time      _________________________________________ Nurse's Signature

## 2012-08-21 NOTE — Progress Notes (Signed)
Problem #1 stage IIA bulky Hodgkin's lymphoma, nodular sclerosing type presenting with a large anterior mediastinal mass and the left cervical and right cervical lymph nodes now status post 5 cycles of ABVD after an incomplete response to 3 cycles. She still has an incomplete response with positive activity in a small portion of the mediastinal mass.  She is here today with her husband to go over the findings. I have discussed her case with Dr. Greggory Stallion at [facility] already. He and I discussed changing her to dose adjusted BEACOPP. She cannot her husband went over in detail the side effects of therapy and the potential for obtaining a complete remission. That remains our goal. If she cannot get into a complete remission or even if she can get into a complete remission, the goal is to give her 2 cycles of therapy followed by involved field radiation. I have already discussed her case once again today with Dr. Michell Heinrich as well.  She continues to have no B. symptoms, no shortness of breath, no chest pain etc.  She continues to work. She is in no acute distress. She has no lymphadenopathy in cervical, supraclavicular, infraclavicular, axillary, epitrochlear, or inguinal areas. The lungs are perfectly clear to auscultation and percussion. Her heart shows a regular rhythm and rate without murmur rub or gallop. Port-A-Cath is intact. Abdomen is soft and nontender without hepatosplenomegaly. She has no leg edema no arm edema.  We are going to proceed with therapy starting Monday, March 17. This will be every 21 days for 2 cycles. She'll need a PET scan after the second cycle. I will see her in 3 weeks  She will be admitted for the first 3 days of therapy for hydration as well as anti-emetics because of the severity of the therapy. This was also recommended by Dr. Greggory Stallion at [facility]

## 2012-08-21 NOTE — Telephone Encounter (Signed)
[employer] [phone] ? IF CPT [id number] REQUIRES AUTH PER JOYCE NO AUTH IS REQUIRED REF# [id number]

## 2012-08-22 ENCOUNTER — Ambulatory Visit (HOSPITAL_COMMUNITY)
Admission: RE | Admit: 2012-08-22 | Discharge: 2012-08-22 | Disposition: A | Discharge status: Home / Self Care | Payer: BC Managed Care – PPO | Source: Ambulatory Visit | Attending: Oncology | Admitting: Oncology

## 2012-08-22 ENCOUNTER — Encounter (HOSPITAL_COMMUNITY): Payer: BC Managed Care – PPO

## 2012-08-22 ENCOUNTER — Encounter (HOSPITAL_BASED_OUTPATIENT_CLINIC_OR_DEPARTMENT_OTHER): Payer: BC Managed Care – PPO

## 2012-08-22 ENCOUNTER — Other Ambulatory Visit (HOSPITAL_COMMUNITY): Payer: BC Managed Care – PPO

## 2012-08-22 DIAGNOSIS — C8119 Nodular sclerosis classical Hodgkin lymphoma, extranodal and solid organ sites: Secondary | ICD-10-CM

## 2012-08-22 DIAGNOSIS — C8190 Hodgkin lymphoma, unspecified, unspecified site: Secondary | ICD-10-CM

## 2012-08-22 LAB — PULMONARY FUNCTION TEST

## 2012-08-22 MED ORDER — HEPARIN SOD (PORK) LOCK FLUSH 100 UNIT/ML IV SOLN
INTRAVENOUS | Status: AC
  Filled 2012-08-22: qty 5

## 2012-08-22 MED ORDER — HEPARIN SOD (PORK) LOCK FLUSH 100 UNIT/ML IV SOLN
500.0000 [IU] | Freq: Once | INTRAVENOUS | Status: AC
  Administered 2012-08-22: 500 [IU] via INTRAVENOUS
  Filled 2012-08-22: qty 5

## 2012-08-22 MED ORDER — SODIUM CHLORIDE 0.9 % IJ SOLN
10.0000 mL | INTRAMUSCULAR | Status: DC | PRN
  Administered 2012-08-22: 10 mL via INTRAVENOUS
  Filled 2012-08-22: qty 10

## 2012-08-22 NOTE — Progress Notes (Signed)
Chemo teaching done and consent signed for Adriamycin, Cytoxan, VP16, Procarbazine, Vincristine, Bleomycin, Prednisone. Med/chemo calendar given. Awaiting specialty pharmacy to let us know when they can deliver Procarbazine and a price on the drug.   Current med list updated.

## 2012-08-22 NOTE — Progress Notes (Signed)
Successful withdrawal of 10 cc blood from accessed port with Alteplase instilled 08/21/12. Flushed port with 20 cc NS and 500 units Heparin. Port de-accessed. No complaints voiced.

## 2012-08-23 ENCOUNTER — Telehealth (HOSPITAL_COMMUNITY): Payer: Self-pay | Admitting: Oncology

## 2012-08-23 ENCOUNTER — Other Ambulatory Visit (HOSPITAL_COMMUNITY): Payer: Self-pay | Admitting: Oncology

## 2012-08-23 ENCOUNTER — Telehealth (HOSPITAL_COMMUNITY): Payer: Self-pay | Admitting: *Deleted

## 2012-08-23 NOTE — Telephone Encounter (Signed)
[facility] [phone] SPK TO TRUDIE THIS AM RE: RX FOR PROCARBAZIE AND ?'D WHY RX HAS NOT BEEN REVIEWED. PER TRUDIE THEY HAVE 2-5 DAYS TO REVIEW. ADVISED HER THAT PT NEEDS MEDS ASAP. REQUESTED THAT RX BE SENT THUR AS STAT AND THAT I WOULD CALL BACK TO CHECK STATUS. CALLED BACK AT 3:45 PM AND WAS TOLD THAT RX HAD BEEN REVIEWED AND COPAY IS $45 BUT PT STILL WILL NOT GET THE RX UNTIL Tuesday. I ALSO CALLED PT TO LET HER KNOW THAT SHE CAN HAVE THE RX DELIVERED HERE IF SHE WILL NOT BE HOME TO SIGN.

## 2012-08-23 NOTE — Procedures (Signed)
NAMEMELLONIE, GUESS             ACCOUNT NO.:  [id number]  MEDICAL RECORD NO.:  [id number]  LOCATION:  RESP                          FACILITY:  [facility]  PHYSICIAN:  Edward L. Juanetta Gosling, M.D.DATE OF BIRTH:  [birth year]  DATE OF PROCEDURE: DATE OF DISCHARGE:  08/22/2012                           PULMONARY FUNCTION TEST   Ms. Seeman has DLCO that is 71% of predicted, but when ventilation is taken into account, it improves to 87% of predicted.     Edward L. Juanetta Gosling, M.D.     ELH/MEDQ  D:  08/22/2012  T:  08/23/2012  Job:  [id number]  cc:   Ladona Horns. Mariel Sleet, MD Fax: [phone]

## 2012-08-23 NOTE — Telephone Encounter (Signed)
Patient called to say that she had talked with specialty pharmacy and earliest drug will be shipped is Tuesday 3/18. Chemo will be r/s to Wednesday and Angie has talked to pharmacy and marked it as urgent.  Bleomycin will be d/c from treatment plan per Dr. Mariel Sleet

## 2012-08-26 ENCOUNTER — Telehealth (HOSPITAL_COMMUNITY): Payer: Self-pay | Admitting: Oncology

## 2012-08-26 ENCOUNTER — Other Ambulatory Visit (HOSPITAL_COMMUNITY): Payer: Self-pay | Admitting: Oncology

## 2012-08-26 ENCOUNTER — Inpatient Hospital Stay (HOSPITAL_COMMUNITY): Payer: BC Managed Care – PPO

## 2012-08-26 DIAGNOSIS — J069 Acute upper respiratory infection, unspecified: Secondary | ICD-10-CM

## 2012-08-26 MED ORDER — SULFAMETHOXAZOLE-TRIMETHOPRIM 800-160 MG PO TABS: 1.0000 | ORAL_TABLET | Freq: Two times a day (BID) | ORAL | Status: DC

## 2012-08-26 MED ORDER — AMOXICILLIN-POT CLAVULANATE 875-125 MG PO TABS: 1.0000 | ORAL_TABLET | Freq: Two times a day (BID) | ORAL | Status: AC

## 2012-08-27 ENCOUNTER — Inpatient Hospital Stay (HOSPITAL_COMMUNITY): Payer: BC Managed Care – PPO

## 2012-08-28 ENCOUNTER — Inpatient Hospital Stay (HOSPITAL_COMMUNITY)
Admission: AD | Admit: 2012-08-28 | Discharge: 2012-08-30 | DRG: 410 | Disposition: A | Discharge status: Home / Self Care | Payer: BC Managed Care – PPO | Source: Ambulatory Visit | Attending: Family Medicine | Admitting: Family Medicine

## 2012-08-28 ENCOUNTER — Encounter (HOSPITAL_COMMUNITY): Payer: Self-pay | Admitting: *Deleted

## 2012-08-28 ENCOUNTER — Inpatient Hospital Stay (HOSPITAL_COMMUNITY): Payer: BC Managed Care – PPO

## 2012-08-28 ENCOUNTER — Encounter (HOSPITAL_BASED_OUTPATIENT_CLINIC_OR_DEPARTMENT_OTHER): Payer: BC Managed Care – PPO

## 2012-08-28 VITALS — BP 120/81 | HR 81 | Temp 98.3°F | Resp 16 | Wt 205.4 lb

## 2012-08-28 DIAGNOSIS — G2581 Restless legs syndrome: Secondary | ICD-10-CM | POA: Diagnosis present

## 2012-08-28 DIAGNOSIS — G4730 Sleep apnea, unspecified: Secondary | ICD-10-CM | POA: Diagnosis present

## 2012-08-28 DIAGNOSIS — C8119 Nodular sclerosis classical Hodgkin lymphoma, extranodal and solid organ sites: Secondary | ICD-10-CM

## 2012-08-28 DIAGNOSIS — Z8719 Personal history of other diseases of the digestive system: Secondary | ICD-10-CM

## 2012-08-28 DIAGNOSIS — C8190 Hodgkin lymphoma, unspecified, unspecified site: Secondary | ICD-10-CM | POA: Diagnosis present

## 2012-08-28 DIAGNOSIS — F329 Major depressive disorder, single episode, unspecified: Secondary | ICD-10-CM | POA: Diagnosis present

## 2012-08-28 DIAGNOSIS — I10 Essential (primary) hypertension: Secondary | ICD-10-CM | POA: Diagnosis present

## 2012-08-28 DIAGNOSIS — F3289 Other specified depressive episodes: Secondary | ICD-10-CM | POA: Diagnosis present

## 2012-08-28 DIAGNOSIS — R0609 Other forms of dyspnea: Secondary | ICD-10-CM | POA: Diagnosis present

## 2012-08-28 DIAGNOSIS — R0989 Other specified symptoms and signs involving the circulatory and respiratory systems: Secondary | ICD-10-CM | POA: Diagnosis present

## 2012-08-28 DIAGNOSIS — Z5111 Encounter for antineoplastic chemotherapy: Secondary | ICD-10-CM

## 2012-08-28 DIAGNOSIS — R011 Cardiac murmur, unspecified: Secondary | ICD-10-CM | POA: Diagnosis present

## 2012-08-28 DIAGNOSIS — IMO0001 Reserved for inherently not codable concepts without codable children: Secondary | ICD-10-CM | POA: Diagnosis present

## 2012-08-28 DIAGNOSIS — Z888 Allergy status to other drugs, medicaments and biological substances status: Secondary | ICD-10-CM

## 2012-08-28 DIAGNOSIS — Z79899 Other long term (current) drug therapy: Secondary | ICD-10-CM

## 2012-08-28 LAB — COMPREHENSIVE METABOLIC PANEL
ALT: 14 U/L (ref 0–35)
AST: 21 U/L (ref 0–37)
Albumin: 3.3 g/dL — ABNORMAL LOW (ref 3.5–5.2)
Alkaline Phosphatase: 63 U/L (ref 39–117)
BUN: 16 mg/dL (ref 6–23)
CO2: 26 mEq/L (ref 19–32)
Calcium: 9.2 mg/dL (ref 8.4–10.5)
Chloride: 104 mEq/L (ref 96–112)
Creatinine, Ser: 0.65 mg/dL (ref 0.50–1.10)
GFR calc Af Amer: >90 mL/min (ref >90)
GFR calc non Af Amer: >90 mL/min (ref >90)
Glucose, Bld: 115 mg/dL — ABNORMAL HIGH (ref 70–99)
Potassium: 3.6 mEq/L (ref 3.5–5.1)
Sodium: 140 mEq/L (ref 135–145)
Total Bilirubin: 0.3 mg/dL (ref 0.3–1.2)
Total Protein: 6.5 g/dL (ref 6.0–8.3)

## 2012-08-28 LAB — CBC WITH DIFFERENTIAL/PLATELET
Basophils Absolute: 0 10*3/uL (ref 0.0–0.1)
Basophils Relative: 0 % (ref 0–1)
Eosinophils Absolute: 0.1 10*3/uL (ref 0.0–0.7)
Eosinophils Relative: 2 % (ref 0–5)
HCT: 32.5 % — ABNORMAL LOW (ref 36.0–46.0)
Hemoglobin: 11.3 g/dL — ABNORMAL LOW (ref 12.0–15.0)
Lymphocytes Relative: 14 % (ref 12–46)
Lymphs Abs: 0.9 10*3/uL (ref 0.7–4.0)
MCH: 32.4 pg (ref 26.0–34.0)
MCHC: 34.8 g/dL (ref 30.0–36.0)
MCV: 93.1 fL (ref 78.0–100.0)
Monocytes Absolute: 0.7 10*3/uL (ref 0.1–1.0)
Monocytes Relative: 11 % (ref 3–12)
Neutro Abs: 4.5 10*3/uL (ref 1.7–7.7)
Neutrophils Relative %: 73 % (ref 43–77)
Platelets: 287 10*3/uL (ref 150–400)
RBC: 3.49 MIL/uL — ABNORMAL LOW (ref 3.87–5.11)
RDW: 14.6 % (ref 11.5–15.5)
WBC: 6.1 10*3/uL (ref 4.0–10.5)

## 2012-08-28 LAB — SEDIMENTATION RATE: Sed Rate: 18 mm/hr (ref 0–22)

## 2012-08-28 LAB — LACTATE DEHYDROGENASE: LDH: 180 U/L (ref 94–250)

## 2012-08-28 MED ORDER — ACETAMINOPHEN 325 MG PO TABS: 650.0000 mg | ORAL_TABLET | Freq: Four times a day (QID) | ORAL | Status: DC | PRN

## 2012-08-28 MED ORDER — SODIUM CHLORIDE 0.9 % IV SOLN: 200.0000 mg/m2 | Freq: Once | INTRAVENOUS | Status: DC

## 2012-08-28 MED ORDER — AMOXICILLIN-POT CLAVULANATE 875-125 MG PO TABS
1.0000 | ORAL_TABLET | Freq: Two times a day (BID) | ORAL | Status: DC
  Administered 2012-08-29 (×2): 1 via ORAL

## 2012-08-28 MED ORDER — SODIUM CHLORIDE 0.9 % IV SOLN
200.0000 mg/m2 | Freq: Once | INTRAVENOUS | Status: DC
  Filled 2012-08-28: qty 21

## 2012-08-28 MED ORDER — ALLOPURINOL 300 MG PO TABS
300.0000 mg | ORAL_TABLET | Freq: Every day | ORAL | Status: DC
  Administered 2012-08-29: 300 mg via ORAL

## 2012-08-28 MED ORDER — LORAZEPAM 2 MG/ML IJ SOLN: 0.5000 mg | INTRAMUSCULAR | Status: DC | PRN

## 2012-08-28 MED ORDER — GABAPENTIN 300 MG PO CAPS
600.0000 mg | ORAL_CAPSULE | Freq: Every day | ORAL | Status: DC
  Administered 2012-08-28 – 2012-08-29 (×2): 600 mg via ORAL

## 2012-08-28 MED ORDER — MORPHINE SULFATE 2 MG/ML IJ SOLN: 2.0000 mg | INTRAMUSCULAR | Status: DC | PRN

## 2012-08-28 MED ORDER — ACYCLOVIR 400 MG PO TABS
400.0000 mg | ORAL_TABLET | Freq: Two times a day (BID) | ORAL | Status: DC
  Administered 2012-08-28: 400 mg via ORAL
  Filled 2012-08-28 (×4): qty 1

## 2012-08-28 MED ORDER — PALONOSETRON HCL INJECTION 0.25 MG/5ML
INTRAVENOUS | Status: AC
  Filled 2012-08-28: qty 5

## 2012-08-28 MED ORDER — ALPRAZOLAM 0.5 MG PO TABS: 0.5000 mg | ORAL_TABLET | Freq: Three times a day (TID) | ORAL | Status: DC | PRN

## 2012-08-28 MED ORDER — PALONOSETRON HCL INJECTION 0.25 MG/5ML
0.2500 mg | Freq: Once | INTRAVENOUS | Status: AC
  Administered 2012-08-28: 0.25 mg via INTRAVENOUS

## 2012-08-28 MED ORDER — HEPARIN SOD (PORK) LOCK FLUSH 100 UNIT/ML IV SOLN
500.0000 [IU] | Freq: Once | INTRAVENOUS | Status: DC | PRN
  Filled 2012-08-28: qty 5

## 2012-08-28 MED ORDER — ONDANSETRON HCL 4 MG PO TABS: 4.0000 mg | ORAL_TABLET | Freq: Four times a day (QID) | ORAL | Status: DC | PRN

## 2012-08-28 MED ORDER — SODIUM CHLORIDE 0.9 % IV SOLN
1250.0000 mg/m2 | Freq: Once | INTRAVENOUS | Status: AC
  Administered 2012-08-28: 2600 mg via INTRAVENOUS
  Filled 2012-08-28: qty 130

## 2012-08-28 MED ORDER — FAMOTIDINE 20 MG PO TABS
20.0000 mg | ORAL_TABLET | Freq: Every day | ORAL | Status: DC
  Administered 2012-08-29: 20 mg via ORAL

## 2012-08-28 MED ORDER — SODIUM CHLORIDE 0.9 % IJ SOLN
10.0000 mL | INTRAMUSCULAR | Status: DC | PRN
  Filled 2012-08-28: qty 10

## 2012-08-28 MED ORDER — DOXORUBICIN HCL CHEMO IV INJECTION 2 MG/ML
35.0000 mg/m2 | Freq: Once | INTRAVENOUS | Status: AC
  Administered 2012-08-28: 72 mg via INTRAVENOUS
  Filled 2012-08-28: qty 36

## 2012-08-28 MED ORDER — METHOCARBAMOL 500 MG PO TABS
500.0000 mg | ORAL_TABLET | Freq: Two times a day (BID) | ORAL | Status: DC
  Administered 2012-08-29 (×2): 500 mg via ORAL

## 2012-08-28 MED ORDER — SODIUM CHLORIDE 0.9 % IV SOLN
Freq: Once | INTRAVENOUS | Status: AC
  Administered 2012-08-28: 10:00:00 via INTRAVENOUS
  Filled 2012-08-28: qty 5

## 2012-08-28 MED ORDER — ONDANSETRON HCL 4 MG/2ML IJ SOLN: 4.0000 mg | Freq: Four times a day (QID) | INTRAMUSCULAR | Status: DC | PRN

## 2012-08-28 MED ORDER — LISINOPRIL-HYDROCHLOROTHIAZIDE 20-12.5 MG PO TABS: 1.0000 | ORAL_TABLET | Freq: Every day | ORAL | Status: DC

## 2012-08-28 MED ORDER — ENOXAPARIN SODIUM 40 MG/0.4ML ~~LOC~~ SOLN
40.0000 mg | SUBCUTANEOUS | Status: DC
  Administered 2012-08-29: 40 mg via SUBCUTANEOUS
  Filled 2012-08-28: qty 0.4

## 2012-08-28 MED ORDER — SODIUM CHLORIDE 0.9 % IV SOLN
200.0000 mg/m2 | Freq: Once | INTRAVENOUS | Status: AC
  Administered 2012-08-28: 420 mg via INTRAVENOUS
  Filled 2012-08-28: qty 21

## 2012-08-28 MED ORDER — HYDROCHLOROTHIAZIDE 12.5 MG PO CAPS
12.5000 mg | ORAL_CAPSULE | Freq: Every day | ORAL | Status: DC
  Administered 2012-08-29: 12.5 mg via ORAL

## 2012-08-28 MED ORDER — SENNA 8.6 MG PO TABS
1.0000 | ORAL_TABLET | Freq: Every day | ORAL | Status: DC
  Administered 2012-08-29: 8.6 mg via ORAL

## 2012-08-28 MED ORDER — ALUM & MAG HYDROXIDE-SIMETH 200-200-20 MG/5ML PO SUSP: 30.0000 mL | Freq: Four times a day (QID) | ORAL | Status: DC | PRN

## 2012-08-28 MED ORDER — SODIUM CHLORIDE 0.9 % IV SOLN
INTRAVENOUS | Status: DC
  Administered 2012-08-28: 09:00:00 via INTRAVENOUS

## 2012-08-28 MED ORDER — ACETAMINOPHEN 650 MG RE SUPP: 650.0000 mg | Freq: Four times a day (QID) | RECTAL | Status: DC | PRN

## 2012-08-28 MED ORDER — LISINOPRIL 10 MG PO TABS
20.0000 mg | ORAL_TABLET | Freq: Every day | ORAL | Status: DC
  Administered 2012-08-29: 20 mg via ORAL

## 2012-08-28 MED ORDER — SODIUM CHLORIDE 0.9 % IV SOLN
INTRAVENOUS | Status: DC
  Administered 2012-08-28 – 2012-08-30 (×5): via INTRAVENOUS

## 2012-08-28 NOTE — H&P (Signed)
History and Physical  [name] [name]:[id number] DOB: Mar 18, 1971 DOA: 08/28/2012  Referring physician: Glenford Peers, MD PCP: [name], MD   Chief Complaint: Undergoing chemotherapy, needs hydration  HPI:  42 year old woman undergoing chemotherapy for Hodgkin's lymphoma. Dr. Mariel Sleet contacted me today and requested admission for hydration and antiemetics given the intensity of her therapy.  Overall patient feels well at the current moment, she is eating well. Some headache but no other pain. No other complaints.  Review of Systems:  Negative for fever, visual changes, sore throat, rash, new muscle aches, chest pain, SOB, dysuria, bleeding, vomiting, abdominal pain.  Positive for nausea.  Past Medical History  Diagnosis Date  . Fibromyalgia   . Eczema   . Depression     parents died close together "was hard"  . Restless leg   . Heart murmur     Told 'nothing to worry about"  No 2D Echo  . Sleep apnea     Sleep Study- 1996- "little apnea- no tx"  . Headache     Birth control pills help  . Hemorrhoid   . Anemia     As a child and while pregnant  . 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 Hypertension     recently dx with hodgkins  . Dyspnea     all the time .".due to the mass"    Past Surgical History  Procedure Laterality Date  . Wisdom tooth extraction    . Dilation and curettage of uterus  1991  . Mediastinal mass biopsy  03/05/12  . Bone marrow biopsy  03/20/2012  . Portacath placement  03/26/2012    Procedure: INSERTION PORT-A-CATH;  Surgeon: Loreli Slot, MD;  Location: [facility] OR;  Service: Thoracic;  Laterality: N/A;    Social History:  reports that she has never smoked. She has never used smokeless tobacco. She reports that she does not drink alcohol or use illicit drugs.  Allergies  Allergen Reactions  . Sulfur Anaphylaxis    Family History  Problem Relation Age of Onset  . Cancer Paternal Uncle   . Cancer  Maternal Grandmother     lung ca mets to throat     Prior to Admission medications   Medication Sig Start Date End Date Taking? Authorizing Provider  acyclovir (ZOVIRAX) 400 MG tablet Take 400 mg by mouth 2 (two) times daily. Take throughout chemo.    Historical Provider, MD  allopurinol (ZYLOPRIM) 300 MG tablet Take 1 tablet (300 mg total) by mouth daily. # 30 2 refills only (3 month supply total) 06/25/12   Randall An, MD  ALPRAZolam Prudy Feeler) 1 MG tablet Take 1/2 tablet to 1 tablet four times daily as needed for anxiety. 08/21/12   Randall An, MD  amoxicillin-clavulanate (AUGMENTIN) 875-125 MG per tablet Take 1 tablet by mouth 2 (two) times daily. 08/26/12 09/02/12  Ellouise Newer, PA-C  cholecalciferol (VITAMIN D) 1000 UNITS tablet Take 1,000 Units by mouth daily.    Historical Provider, MD  fish oil-omega-3 fatty acids 1000 MG capsule Take 1 g by mouth daily.    Historical Provider, MD  gabapentin (NEURONTIN) 600 MG tablet Take 600 mg by mouth at bedtime.     Historical Provider, MD  ibuprofen (ADVIL,MOTRIN) 200 MG tablet Take 400 mg by mouth every 8 (eight) hours as needed. For pain    Historical Provider, MD  iron polysaccharides (NIFEREX) 150 MG capsule Take 150 mg by mouth daily. May take  1 tablet three times a week if stomach upset occurs. (not taking @ present due to severe constipation)    Historical Provider, MD  lidocaine-prilocaine (EMLA) cream Apply topically as needed. Apply a quarter sized amount to port site 1 hour prior to chemo. Do not rub in. Cover with plastic.    Historical Provider, MD  lisinopril-hydrochlorothiazide (PRINZIDE,ZESTORETIC) 20-12.5 MG per tablet Take 1 tablet by mouth daily.    Historical Provider, MD  methocarbamol (ROBAXIN) 500 MG tablet Take 500 mg by mouth 2 (two) times daily. Refill given on 06/11/12 per Elijah Birk (only taking 1 a day @ present)    Historical Provider, MD  norethindrone-ethinyl estradiol (JUNEL FE,GILDESS FE,LOESTRIN FE) 1-20 MG-MCG  tablet Take 1 tablet by mouth daily.    Historical Provider, MD  ondansetron (ZOFRAN) 8 MG tablet Take 8 mg by mouth 3 (three) times daily as needed for nausea. Do not take until the 3rd day after chemotherapy due to the premeds (aloxi/emend) that we give you.    Historical Provider, MD  oxyCODONE-acetaminophen (PERCOCET/ROXICET) 5-325 MG per tablet Can take 1 or 2 every 6 hours as needed for pain. 04/03/12   Randall An, MD  prochlorperazine (COMPAZINE) 25 MG suppository Place 1 suppository (25 mg total) rectally every 6 (six) hours as needed for nausea. 03/24/12   Randall An, MD  ranitidine (ZANTAC) 150 MG capsule Take 150 mg by mouth daily.    Historical Provider, MD  senna (SENOKOT) 8.6 MG TABS Take 1 tablet by mouth at bedtime.    Historical Provider, MD  tiZANidine (ZANAFLEX) 4 MG capsule Take 4-6 mg by mouth at bedtime. Takes 4-6 mg dose depends on severity of spasms.    Historical Provider, MD  traMADol (ULTRAM) 50 MG tablet Take 1 tablet (50 mg total) by mouth every 6 (six) hours as needed for pain (may take one or two tablets every six hrs prn). 03/06/12   Loreli Slot, MD   Physical Exam: Filed Vitals:   08/28/12 1640  BP: 148/89  Pulse: 96  Temp: 97.8 F (36.6 C)  TempSrc: Oral  Resp: 18  Height: 5\' 6"  (1.676 m)  Weight: 93.169 kg (205 lb 6.4 oz)  SpO2: 98%   General:  Appears calm and comfortable eating dinner. Eyes: PERRL, normal lids, irises ENT: grossly normal hearing, lips & tongue Neck: Appears grossly normal Cardiovascular: RRR, no m/r/g. No LE edema. Respiratory: CTA bilaterally, no w/r/r. Normal respiratory effort. Abdomen: soft, ntnd Skin: no rash or induration seen on limited exam Musculoskeletal: grossly normal tone BUE/BLE Psychiatric: grossly normal mood and affect, speech fluent and appropriate Neurologic: grossly non-focal.  Wt Readings from Last 3 Encounters:  08/28/12 93.169 kg (205 lb 6.4 oz)  08/28/12 93.169 kg (205 lb 6.4 oz)   08/07/12 92.534 kg (204 lb)    Labs on Admission:  Basic Metabolic Panel:  Recent Labs Lab 08/28/12 0830  NA 140  K 3.6  CL 104  CO2 26  GLUCOSE 115*  BUN 16  CREATININE 0.65  CALCIUM 9.2    Liver Function Tests:  Recent Labs Lab 08/28/12 0830  AST 21  ALT 14  ALKPHOS 63  BILITOT 0.3  PROT 6.5  ALBUMIN 3.3*    CBC:  Recent Labs Lab 08/28/12 0830  WBC 6.1  NEUTROABS 4.5  HGB 11.3*  HCT 32.5*  MCV 93.1  PLT 287    Active Problems:   Hodgkin's lymphoma   Assessment/Plan 1. Hodgkin's lymphoma: Currently undergoing intensive therapy requiring IV  hydration antiemetics. Oncology will continue to follow in consultation.  Code Status: Full code Family Communication: Discussed with husband at bedside Disposition Plan/Anticipated LOS: Admit, 2-3 days.  Time spent: 50 minutes  [name], MD  [name] Pager [phone] 08/28/2012, 5:35 PM

## 2012-08-28 NOTE — Progress Notes (Signed)
Report called to Shanda Bumps, RN in Dept. 300.  Informed of the importance of IVF and anti-emetics.

## 2012-08-28 NOTE — Progress Notes (Signed)
Patient states that she hasn't eaten in 12 hours. Patient gave a list of things she could not have, such as sugar, carbs, caffeine, etc. During day shift she was offered a tray of food, but she stated she could not eat what was on the tray. She stated that she did not receive another tray that she could eat. Patient's husband then left during day shift to go get patient some sugar free pudding and jello.   At the beginning of night shift patient requested two pieces of buttered toast, but dining services had already left. I then contacted the [facility]. I asked patient if there was any food that she could eat, but she just stated that her husband has just left to go home and get her some food. The [facility] came to speak with patient. Patient stated to [employer] [facility] that there were certain foods that did not agree with her because she had chemotherapy today. AC then got a list of breakfast foods that she could eat at 7 am since she will have chemotherapy at 8 am. Apologies were given to patient due to not receiving another dinner tray and we want to meet her needs.

## 2012-08-29 ENCOUNTER — Inpatient Hospital Stay (HOSPITAL_BASED_OUTPATIENT_CLINIC_OR_DEPARTMENT_OTHER): Payer: BC Managed Care – PPO

## 2012-08-29 VITALS — BP 129/82 | HR 90 | Temp 97.9°F | Resp 16 | Wt 213.6 lb

## 2012-08-29 DIAGNOSIS — Z5111 Encounter for antineoplastic chemotherapy: Secondary | ICD-10-CM

## 2012-08-29 DIAGNOSIS — C8190 Hodgkin lymphoma, unspecified, unspecified site: Secondary | ICD-10-CM

## 2012-08-29 DIAGNOSIS — C8119 Nodular sclerosis classical Hodgkin lymphoma, extranodal and solid organ sites: Secondary | ICD-10-CM

## 2012-08-29 LAB — BASIC METABOLIC PANEL
BUN: 15 mg/dL (ref 6–23)
CO2: 20 mEq/L (ref 19–32)
Calcium: 7.7 mg/dL — ABNORMAL LOW (ref 8.4–10.5)
Chloride: 104 mEq/L (ref 96–112)
Creatinine, Ser: 0.6 mg/dL (ref 0.50–1.10)
GFR calc Af Amer: >90 mL/min (ref >90)
GFR calc non Af Amer: >90 mL/min (ref >90)
Glucose, Bld: 129 mg/dL — ABNORMAL HIGH (ref 70–99)
Potassium: 3.4 mEq/L — ABNORMAL LOW (ref 3.5–5.1)
Sodium: 136 mEq/L (ref 135–145)

## 2012-08-29 MED ORDER — HEPARIN SOD (PORK) LOCK FLUSH 100 UNIT/ML IV SOLN
500.0000 [IU] | Freq: Once | INTRAVENOUS | Status: DC | PRN
  Filled 2012-08-29: qty 5

## 2012-08-29 MED ORDER — ETOPOSIDE CHEMO INJECTION 1 GM/50ML
200.0000 mg/m2 | Freq: Once | INTRAVENOUS | Status: DC
  Filled 2012-08-29: qty 21

## 2012-08-29 MED ORDER — ETOPOSIDE CHEMO INJECTION 1 GM/50ML
200.0000 mg/m2 | Freq: Once | INTRAVENOUS | Status: AC
  Administered 2012-08-29: 420 mg via INTRAVENOUS
  Filled 2012-08-29: qty 21

## 2012-08-29 MED ORDER — PREDNISONE 20 MG PO TABS
80.0000 mg | ORAL_TABLET | Freq: Once | ORAL | Status: AC
  Administered 2012-08-29: 80 mg via ORAL
  Filled 2012-08-29: qty 4

## 2012-08-29 MED ORDER — SODIUM CHLORIDE 0.9 % IV SOLN
12.0000 mg | Freq: Once | INTRAVENOUS | Status: AC
  Administered 2012-08-29: 12 mg via INTRAVENOUS
  Filled 2012-08-29: qty 1.2

## 2012-08-29 MED ORDER — DEXAMETHASONE SODIUM PHOSPHATE 4 MG/ML IJ SOLN: 12.0000 mg | Freq: Once | INTRAMUSCULAR | Status: DC

## 2012-08-29 MED ORDER — SODIUM CHLORIDE 0.9 % IV SOLN: 200.0000 mg/m2 | Freq: Once | INTRAVENOUS | Status: DC

## 2012-08-29 MED ORDER — FUROSEMIDE 10 MG/ML IJ SOLN
20.0000 mg | Freq: Once | INTRAMUSCULAR | Status: AC
  Administered 2012-08-29: 20 mg via INTRAVENOUS

## 2012-08-29 MED ORDER — FUROSEMIDE 10 MG/ML IJ SOLN
INTRAMUSCULAR | Status: AC
  Filled 2012-08-29: qty 2

## 2012-08-29 MED ORDER — FUROSEMIDE 10 MG/ML IJ SOLN
40.0000 mg | Freq: Once | INTRAMUSCULAR | Status: AC
  Administered 2012-08-29: 40 mg via INTRAVENOUS
  Filled 2012-08-29: qty 4

## 2012-08-29 MED ORDER — ACYCLOVIR 200 MG PO CAPS
400.0000 mg | ORAL_CAPSULE | Freq: Two times a day (BID) | ORAL | Status: DC
  Administered 2012-08-29 (×2): 400 mg via ORAL

## 2012-08-29 MED ORDER — SODIUM CHLORIDE 0.9 % IJ SOLN
10.0000 mL | INTRAMUSCULAR | Status: DC | PRN
  Filled 2012-08-29: qty 10

## 2012-08-29 MED ORDER — HALOPERIDOL 2 MG PO TABS: 1.0000 mg | ORAL_TABLET | Freq: Three times a day (TID) | ORAL | Status: DC | PRN

## 2012-08-29 NOTE — Progress Notes (Signed)
[name] PROGRESS NOTE  [name] [name]:[id number] DOB: September 19, 1970 DOA: 08/28/2012 PCP: [name], MD  Assessment/Plan: 1. Hodgkin's lymphoma: Currently undergoing intensive therapy requiring IV hydration antiemetics. Mild nausea otherwise doing okay.  Discuss with oncology today. They plan one additional treatment 3/21 and then discharged home.  Code Status: Full code Family Communication: None present Disposition Plan: Home 3/21.  [name], MD  [name]  Pager [phone] If 7PM-7AM, please contact night-coverage at [email or web address], password [facility] 08/29/2012, 5:03 PM  LOS: 1 day   Brief narrative: 42 year old woman undergoing chemotherapy for Hodgkin's lymphoma. Dr. Mariel Sleet contacted me today and requested admission for hydration and antiemetics given the intensity of her therapy.   Consultants:  Oncology  Procedures:  Chemotherapy  HPI/Subjective: Overall feels okay.  Objective: Filed Vitals:   08/28/12 1640 08/28/12 2010 08/29/12 0527 08/29/12 1530  BP: 148/89 144/84 110/71 148/76  Pulse: 96 90 95 91  Temp: 97.8 F (36.6 C) 98.2 F (36.8 C) 97.8 F (36.6 C) 98.1 F (36.7 C)  TempSrc: Oral Oral Oral   Resp: 18 16 16 18   Height: 5\' 6"  (1.676 m)     Weight: 93.169 kg (205 lb 6.4 oz)     SpO2: 98% 99% 95% 100%   No intake or output data in the 24 hours ending 08/29/12 1703 Filed Weights   08/28/12 1640  Weight: 93.169 kg (205 lb 6.4 oz)    Exam:  General:  Appears calm and comfortable Cardiovascular: RRR, no m/r/g. No LE edema. Respiratory: CTA bilaterally, no w/r/r. Normal respiratory effort. Psychiatric: grossly normal mood and affect, speech fluent and appropriate   Data Reviewed: Basic Metabolic Panel:  Recent Labs Lab 08/28/12 0830 08/29/12 0456  NA 140 136  K 3.6 3.4*  CL 104 104  CO2 26 20  GLUCOSE 115* 129*  BUN 16 15  CREATININE 0.65 0.60  CALCIUM 9.2 7.7*   Liver Function Tests:  Recent Labs Lab  08/28/12 0830  AST 21  ALT 14  ALKPHOS 63  BILITOT 0.3  PROT 6.5  ALBUMIN 3.3*   CBC:  Recent Labs Lab 08/28/12 0830  WBC 6.1  NEUTROABS 4.5  HGB 11.3*  HCT 32.5*  MCV 93.1  PLT 287    Scheduled Meds: . acyclovir  400 mg Oral BID  . allopurinol  300 mg Oral Daily  . amoxicillin-clavulanate  1 tablet Oral BID  . enoxaparin (LOVENOX) injection  40 mg Subcutaneous Q24H  . famotidine  20 mg Oral Daily  . gabapentin  600 mg Oral QHS  . lisinopril  20 mg Oral Daily   And  . hydrochlorothiazide  12.5 mg Oral Daily  . methocarbamol  500 mg Oral BID  . senna  1 tablet Oral QHS   Continuous Infusions: . sodium chloride 175 mL/hr at 08/29/12 6213    Active Problems:   Hodgkin's lymphoma     [name], MD  [name] Pager [phone] If 7PM-7AM, please contact night-coverage at [email or web address], password [facility] 08/29/2012, 5:03 PM  LOS: 1 day   Time spent: 20 minutes

## 2012-08-29 NOTE — Progress Notes (Signed)
Dr. Irene Limbo paged and made aware of pt's statement "I've gained 9 pounds overnight."  Pt also made mention that the Cancer Center made Dr. Mariel Sleet aware of weight gain and she received a dose of IV lasix in the Cancer [facility] today. New order received for 40 mg of IV lasix once. Will continue to monitor.

## 2012-08-29 NOTE — Progress Notes (Signed)
UR Chart Review Completed  

## 2012-08-29 NOTE — Progress Notes (Signed)
Nutrition Education Note  RD drawn to chart secondary to current chemotherapy drug, a medication with potential interactions with high tyramine-containing foods.  RD provided "Tyramine-Restricted Nutrition Therapy" handout and have discussed pt restriction while taking current medication. Reviewed list of foods to avoid.  Body mass index is 33.17 kg/(m^2). Pt meets criteria for Obesity Class I based on current BMI.  Current diet order is CHO Modified , patient is consuming approximately 75% of meals at this time. Additional labs and medications reviewed.   RD will change current diet to Low Tyramine Diet/CHO Modified to prevent potential food-medication interaction.  Diet office aware and will obtain pt meal preferences.  No further nutrition interventions warranted at this time. RD contact information provided. If additional nutrition issues arise, please re-consult RD.  Royann Shivers MS,RD,LDN,CSG Office: [phone] Pager: [phone]

## 2012-08-30 ENCOUNTER — Encounter (HOSPITAL_BASED_OUTPATIENT_CLINIC_OR_DEPARTMENT_OTHER): Payer: BC Managed Care – PPO

## 2012-08-30 ENCOUNTER — Other Ambulatory Visit (HOSPITAL_COMMUNITY): Payer: Self-pay | Admitting: Oncology

## 2012-08-30 VITALS — BP 152/94 | HR 73 | Temp 98.4°F | Resp 16 | Wt 209.6 lb

## 2012-08-30 DIAGNOSIS — C8119 Nodular sclerosis classical Hodgkin lymphoma, extranodal and solid organ sites: Secondary | ICD-10-CM

## 2012-08-30 DIAGNOSIS — Z5111 Encounter for antineoplastic chemotherapy: Secondary | ICD-10-CM

## 2012-08-30 DIAGNOSIS — G4700 Insomnia, unspecified: Secondary | ICD-10-CM

## 2012-08-30 DIAGNOSIS — C8190 Hodgkin lymphoma, unspecified, unspecified site: Secondary | ICD-10-CM

## 2012-08-30 MED ORDER — METHOCARBAMOL 500 MG PO TABS: 500.0000 mg | ORAL_TABLET | Freq: Two times a day (BID) | ORAL | Status: DC

## 2012-08-30 MED ORDER — HEPARIN SOD (PORK) LOCK FLUSH 100 UNIT/ML IV SOLN
500.0000 [IU] | Freq: Once | INTRAVENOUS | Status: AC | PRN
  Administered 2012-08-30: 500 [IU]
  Filled 2012-08-30: qty 5

## 2012-08-30 MED ORDER — SODIUM CHLORIDE 0.9 % IV SOLN
12.0000 mg | Freq: Once | INTRAVENOUS | Status: AC
  Administered 2012-08-30: 12 mg via INTRAVENOUS
  Filled 2012-08-30: qty 1.2

## 2012-08-30 MED ORDER — SODIUM CHLORIDE 0.9 % IV SOLN
INTRAVENOUS | Status: DC
  Administered 2012-08-30: 09:00:00 via INTRAVENOUS

## 2012-08-30 MED ORDER — DEXAMETHASONE SODIUM PHOSPHATE 4 MG/ML IJ SOLN
12.0000 mg | Freq: Once | INTRAMUSCULAR | Status: DC
  Filled 2012-08-30: qty 3

## 2012-08-30 MED ORDER — FUROSEMIDE 10 MG/ML IJ SOLN
20.0000 mg | Freq: Once | INTRAMUSCULAR | Status: AC
  Administered 2012-08-30: 20 mg via INTRAVENOUS

## 2012-08-30 MED ORDER — PREDNISONE 50 MG PO TABS
80.0000 mg | ORAL_TABLET | Freq: Once | ORAL | Status: AC
  Administered 2012-08-30: 80 mg via ORAL
  Filled 2012-08-30: qty 1

## 2012-08-30 MED ORDER — TEMAZEPAM 15 MG PO CAPS: 15.0000 mg | ORAL_CAPSULE | Freq: Every evening | ORAL | Status: DC | PRN

## 2012-08-30 MED ORDER — SODIUM CHLORIDE 0.9 % IV SOLN
200.0000 mg/m2 | Freq: Once | INTRAVENOUS | Status: AC
  Administered 2012-08-30: 420 mg via INTRAVENOUS
  Filled 2012-08-30: qty 21

## 2012-08-30 MED ORDER — FUROSEMIDE 10 MG/ML IJ SOLN
INTRAMUSCULAR | Status: AC
  Filled 2012-08-30: qty 4

## 2012-08-30 MED ORDER — HEPARIN SOD (PORK) LOCK FLUSH 100 UNIT/ML IV SOLN
INTRAVENOUS | Status: AC
  Filled 2012-08-30: qty 5

## 2012-08-30 MED ORDER — PENTAMIDINE ISETHIONATE 300 MG IN SOLR
300.0000 mg | Freq: Once | RESPIRATORY_TRACT | Status: AC
  Administered 2012-08-30: 300 mg via RESPIRATORY_TRACT
  Filled 2012-08-30: qty 300

## 2012-08-30 NOTE — Patient Instructions (Addendum)
[facility]  MEDICATIONS PRESCRIBED:  1.  Your Gabapentin and Robaxin have been called in for a refill to [facility] pharmacy. 2.  To help with sleep, we have prescribed Restoril - you may choose to start with a 15mg  dose (1 tab) and increase up to a maximum of 45mg  (3 tabs) at bedtime as needed for insomnia. 3.  Do not take Lorazepam (Ativan) or Alprazolam (Xanax) together or while taking Restoril - Please have someone help you track administration of these medications.

## 2012-08-30 NOTE — Progress Notes (Signed)
[name] PROGRESS NOTE  [name] [name] MWU:[id number] DOB: 03/31/1971 DOA: 08/28/2012 PCP: [name], MD  Assessment/Plan: 1. Hodgkin's lymphoma: Currently undergoing intensive therapy requiring IV hydration antiemetics.   Last treatment today, home afterwards. Discussed with oncology yesterday 3/20.   Code Status: Full code Family Communication: None present Disposition Plan: Home today.  [name], MD  [name]  Pager [phone] If 7PM-7AM, please contact night-coverage at [email or web address], password [facility] 08/30/2012, 8:04 AM  LOS: 2 days   Brief narrative: 42 year old woman undergoing chemotherapy for Hodgkin's lymphoma. Dr. Mariel Sleet contacted me today and requested admission for hydration and antiemetics given the intensity of her therapy.   Consultants:  Oncology  Procedures:  Chemotherapy  HPI/Subjective: Complains of insomnia but otherwise doing well. Edema decreased today. Urinating well.  Objective: Filed Vitals:   08/29/12 0527 08/29/12 1530 08/29/12 2126 08/30/12 0517  BP: 110/71 148/76 141/71 114/77  Pulse: 95 91 89 82  Temp: 97.8 F (36.6 C) 98.1 F (36.7 C) 98.3 F (36.8 C) 98.2 F (36.8 C)  TempSrc: Oral  Oral   Resp: 16 18 16 16   Height:      Weight:      SpO2: 95% 100% 100% 95%    Intake/Output Summary (Last 24 hours) at 08/30/12 0804 Last data filed at 08/30/12 4034  Gross per 24 hour  Intake 4037.5 ml  Output      0 ml  Net 4037.5 ml   Filed Weights   08/28/12 1640  Weight: 93.169 kg (205 lb 6.4 oz)    Exam:  General:  Appears calm and comfortable Cardiovascular: RRR, no m/r/g. No ankle edema. Respiratory: CTA bilaterally, no w/r/r. Normal respiratory effort. Psychiatric: grossly normal mood and affect, speech fluent and appropriate  Data Reviewed: Basic Metabolic Panel:  Recent Labs Lab 08/28/12 0830 08/29/12 0456  NA 140 136  K 3.6 3.4*  CL 104 104  CO2 26 20  GLUCOSE 115* 129*  BUN 16 15   CREATININE 0.65 0.60  CALCIUM 9.2 7.7*   Liver Function Tests:  Recent Labs Lab 08/28/12 0830  AST 21  ALT 14  ALKPHOS 63  BILITOT 0.3  PROT 6.5  ALBUMIN 3.3*   CBC:  Recent Labs Lab 08/28/12 0830  WBC 6.1  NEUTROABS 4.5  HGB 11.3*  HCT 32.5*  MCV 93.1  PLT 287    Scheduled Meds: . acyclovir  400 mg Oral BID  . allopurinol  300 mg Oral Daily  . amoxicillin-clavulanate  1 tablet Oral BID  . enoxaparin (LOVENOX) injection  40 mg Subcutaneous Q24H  . famotidine  20 mg Oral Daily  . gabapentin  600 mg Oral QHS  . lisinopril  20 mg Oral Daily   And  . hydrochlorothiazide  12.5 mg Oral Daily  . methocarbamol  500 mg Oral BID  . senna  1 tablet Oral QHS   Continuous Infusions: . sodium chloride 175 mL/hr at 08/30/12 0255    Active Problems:   Hodgkin's lymphoma   [name], MD  [name] Pager [phone] If 7PM-7AM, please contact night-coverage at [email or web address], password [facility] 08/30/2012, 8:04 AM  LOS: 2 days

## 2012-08-30 NOTE — Discharge Summary (Signed)
Physician Discharge Summary  [name] [name] VQQ:[id number] DOB: 08-07-1970 DOA: 08/28/2012  PCP: [name] Melena, MD  Admit date: 08/28/2012 Discharge date: 08/30/2012   Follow-up Information   Follow up with [facility], MD. (As needed)    Contact information:   [address] [name] [address] [phone]       Follow up with [name] An, MD. (as directed by office)    Contact information:   [address] [place] [address] [phone]      Discharge Diagnoses:  1. Hodgkin's lymphoma currently undergoing chemotherapy  Discharge Condition: Improved Disposition: Home  Diet recommendation: Special diet: no tyramine, sugar or caffeine.  Filed Weights   08/28/12 1640  Weight: 93.169 kg (205 lb 6.4 oz)    History of present illness:  42 year old woman undergoing chemotherapy for Hodgkin's lymphoma. [name] [name] contacted me and requested admission for hydration and antiemetics given the intensity of her therapy.   Hospital Course:  [name] was admitted for IV hydration and antiemetics while undergoing intensive chemotherapy for Hodgkin's lymphoma. Her hospital course was uncomplicated. She is tolerating her treatments and is stable for discharge today after completing her chemotherapy treatment at the cancer center.  1. Hodgkin's lymphoma: Undergoing intensive therapy requiring IV hydration antiemetics.  Consultants:  Oncology  Procedures:  Chemotherapy  Discharge Instructions  Discharge Orders   Future Appointments Provider Department Dept Phone   08/30/2012 8:45 AM [facility] Team A [facility] [phone]   09/04/2012 9:15 AM [facility] Team A [facility] [phone]   09/05/2012 4:30 PM [facility] Chair 7 [facility] [phone]   09/06/2012 4:30 PM [facility] Chair 7 [facility] [phone]   09/07/2012 10:30 AM [facility] Chair 7 [facility] [phone]   09/08/2012 10:30 AM [facility]  Chair 7 [facility] [phone]   09/09/2012 9:30 AM [name] An, MD [facility] [phone]   09/09/2012 9:30 AM [facility] Chair 7 [facility] [phone]   09/10/2012 4:30 PM [facility] Chair 7 [facility] [phone]   Future Orders Complete By Expires     Activity as tolerated - No restrictions  As directed     Discharge instructions  As directed     Comments:      Followup with [name] [name] instructed by his office.        Medication List    TAKE these medications       acyclovir 400 MG tablet  Commonly known as:  ZOVIRAX  Take 400 mg by mouth 2 (two) times daily. Take throughout chemo.     allopurinol 300 MG tablet  Commonly known as:  ZYLOPRIM  Take 1 tablet (300 mg total) by mouth daily. # 30 2 refills only (3 month supply total)     ALPRAZolam 1 MG tablet  Commonly known as:  XANAX  Take 1/2 tablet to 1 tablet four times daily as needed for anxiety.     amoxicillin-clavulanate 875-125 MG per tablet  Commonly known as:  AUGMENTIN  Take 1 tablet by mouth 2 (two) times daily.     cholecalciferol 1000 UNITS tablet  Commonly known as:  VITAMIN D  Take 1,000 Units by mouth daily.     fish oil-omega-3 fatty acids 1000 MG capsule  Take 1 g by mouth daily.     gabapentin 600 MG tablet  Commonly known as:  NEURONTIN  Take 600 mg by mouth at bedtime.  ibuprofen 200 MG tablet  Commonly known as:  ADVIL,MOTRIN  Take 400 mg by mouth every 8 (eight) hours as needed. For pain     lidocaine-prilocaine cream  Commonly known as:  EMLA  Apply topically as needed. Apply a quarter sized amount to port site 1 hour prior to chemo. Do not rub in. Cover with plastic.     lisinopril-hydrochlorothiazide 20-12.5 MG per tablet  Commonly known as:  PRINZIDE,ZESTORETIC  Take 1 tablet by mouth daily.     LORazepam 1 MG tablet  Commonly known as:  ATIVAN  Take 1 tablet by mouth at bedtime as needed. For nausea      methocarbamol 500 MG tablet  Commonly known as:  ROBAXIN  Take 500 mg by mouth 2 (two) times daily. Refill given on 06/11/12 per [name] [name] (only taking 1 a day @ present)     norethindrone-ethinyl estradiol 1-20 MG-MCG tablet  Commonly known as:  JUNEL FE,GILDESS FE,LOESTRIN FE  Take 1 tablet by mouth daily.     ondansetron 8 MG tablet  Commonly known as:  ZOFRAN  Take 8 mg by mouth 3 (three) times daily as needed for nausea. Do not take until the 3rd day after chemotherapy due to the premeds (aloxi/emend) that we give you.     oxyCODONE-acetaminophen 5-325 MG per tablet  Commonly known as:  PERCOCET/ROXICET  Can take 1 or 2 every 6 hours as needed for pain.     potassium chloride 10 MEQ tablet  Commonly known as:  K-DUR,KLOR-CON  Take 10 mEq by mouth daily.     predniSONE 20 MG tablet  Commonly known as:  DELTASONE  Take 80 mg by mouth daily. Days 1-14, then off 7 days.     procarbazine 50 MG capsule  Commonly known as:  MATULANE  Take 200 mg by mouth daily. Follow low tyramine diet., For seven days then off 14 days and then another seven day cycle.     prochlorperazine 25 MG suppository  Commonly known as:  COMPAZINE  Place 1 suppository (25 mg total) rectally every 6 (six) hours as needed for nausea.     ranitidine 150 MG capsule  Commonly known as:  ZANTAC  Take 150 mg by mouth daily.     senna 8.6 MG Tabs  Commonly known as:  SENOKOT  Take 1 tablet by mouth at bedtime.     tiZANidine 4 MG capsule  Commonly known as:  ZANAFLEX  Take 4-6 mg by mouth at bedtime. Takes 4-6 mg dose depends on severity of spasms.     traMADol 50 MG tablet  Commonly known as:  ULTRAM  Take 1 tablet (50 mg total) by mouth every 6 (six) hours as needed for pain (may take one or two tablets every six hrs prn).         The results of significant diagnostics from this hospitalization (including imaging, microbiology, ancillary and laboratory) are listed below for reference.     Labs: Basic  Metabolic Panel:  Recent Labs Lab 08/28/12 0830 08/29/12 0456  NA 140 136  K 3.6 3.4*  CL 104 104  CO2 26 20  GLUCOSE 115* 129*  BUN 16 15  CREATININE 0.65 0.60  CALCIUM 9.2 7.7*   Liver Function Tests:  Recent Labs Lab 08/28/12 0830  AST 21  ALT 14  ALKPHOS 63  BILITOT 0.3  PROT 6.5  ALBUMIN 3.3*   CBC:  Recent Labs Lab 08/28/12 0830  WBC 6.1  NEUTROABS 4.5  HGB  11.3*  HCT 32.5*  MCV 93.1  PLT 287    Active Problems:   Hodgkin's lymphoma   Time coordinating discharge: 15 minutes  Signed:  [name], MD [name] 08/30/2012, 8:10 AM

## 2012-09-01 ENCOUNTER — Encounter (HOSPITAL_BASED_OUTPATIENT_CLINIC_OR_DEPARTMENT_OTHER): Payer: BC Managed Care – PPO

## 2012-09-01 DIAGNOSIS — C8190 Hodgkin lymphoma, unspecified, unspecified site: Secondary | ICD-10-CM

## 2012-09-01 DIAGNOSIS — C8119 Nodular sclerosis classical Hodgkin lymphoma, extranodal and solid organ sites: Secondary | ICD-10-CM

## 2012-09-01 LAB — CBC WITH DIFFERENTIAL/PLATELET
Basophils Absolute: 0 10*3/uL (ref 0.0–0.1)
Basophils Relative: 0 % (ref 0–1)
Eosinophils Absolute: 0 10*3/uL (ref 0.0–0.7)
Eosinophils Relative: 0 % (ref 0–5)
HCT: 32.2 % — ABNORMAL LOW (ref 36.0–46.0)
Hemoglobin: 11.6 g/dL — ABNORMAL LOW (ref 12.0–15.0)
Lymphocytes Relative: 4 % — ABNORMAL LOW (ref 12–46)
Lymphs Abs: 0.2 10*3/uL — ABNORMAL LOW (ref 0.7–4.0)
MCH: 32.4 pg (ref 26.0–34.0)
MCHC: 36 g/dL (ref 30.0–36.0)
MCV: 89.9 fL (ref 78.0–100.0)
Monocytes Absolute: 0 10*3/uL — ABNORMAL LOW (ref 0.1–1.0)
Monocytes Relative: 0 % — ABNORMAL LOW (ref 3–12)
Neutro Abs: 5.1 10*3/uL (ref 1.7–7.7)
Neutrophils Relative %: 96 % — ABNORMAL HIGH (ref 43–77)
Platelets: 222 10*3/uL (ref 150–400)
RBC: 3.58 MIL/uL — ABNORMAL LOW (ref 3.87–5.11)
RDW: 13.3 % (ref 11.5–15.5)
WBC: 5.3 10*3/uL (ref 4.0–10.5)

## 2012-09-01 LAB — COMPREHENSIVE METABOLIC PANEL
ALT: 35 U/L (ref 0–35)
AST: 28 U/L (ref 0–37)
Albumin: 3.8 g/dL (ref 3.5–5.2)
Alkaline Phosphatase: 57 U/L (ref 39–117)
BUN: 20 mg/dL (ref 6–23)
CO2: 25 mEq/L (ref 19–32)
Calcium: 9.8 mg/dL (ref 8.4–10.5)
Chloride: 97 mEq/L (ref 96–112)
Creatinine, Ser: 0.69 mg/dL (ref 0.50–1.10)
GFR calc Af Amer: >90 mL/min (ref >90)
GFR calc non Af Amer: >90 mL/min (ref >90)
Glucose, Bld: 132 mg/dL — ABNORMAL HIGH (ref 70–99)
Potassium: 3.9 mEq/L (ref 3.5–5.1)
Sodium: 135 mEq/L (ref 135–145)
Total Bilirubin: 0.4 mg/dL (ref 0.3–1.2)
Total Protein: 6.9 g/dL (ref 6.0–8.3)

## 2012-09-01 NOTE — Progress Notes (Signed)
[name] [name] presented for labwork. Labs per MD order drawn via Peripheral Line 23 gauge needle inserted in left AC  Good blood return present. Procedure without incident.  Needle removed intact. Patient tolerated procedure well.  Has non-productive cough and complains  "Just don't feel good.  This chemo has kicked my butt."  Encouraged not to go to work tomorrow if still feeling that same. Had some Tussionex that she was given when she had pneumonia and wanted to know if ok to take.  Explained that she could but if she need to let us know if she began to cough productively, or if cough worsens or if she begins to run fever.  Verbalized understanding of instructions.

## 2012-09-02 ENCOUNTER — Inpatient Hospital Stay (HOSPITAL_COMMUNITY): Payer: BC Managed Care – PPO

## 2012-09-02 ENCOUNTER — Telehealth (HOSPITAL_COMMUNITY): Payer: Self-pay | Admitting: Oncology

## 2012-09-02 ENCOUNTER — Other Ambulatory Visit (HOSPITAL_COMMUNITY): Payer: Self-pay | Admitting: Oncology

## 2012-09-02 DIAGNOSIS — R05 Cough: Secondary | ICD-10-CM

## 2012-09-02 DIAGNOSIS — R059 Cough, unspecified: Secondary | ICD-10-CM

## 2012-09-02 MED ORDER — HYDROCOD POLST-CHLORPHEN POLST 10-8 MG/5ML PO LQCR: 5.0000 mL | Freq: Two times a day (BID) | ORAL | Status: DC | PRN

## 2012-09-03 ENCOUNTER — Ambulatory Visit (HOSPITAL_COMMUNITY): Payer: BC Managed Care – PPO

## 2012-09-04 ENCOUNTER — Ambulatory Visit (HOSPITAL_COMMUNITY): Payer: BC Managed Care – PPO

## 2012-09-04 ENCOUNTER — Inpatient Hospital Stay (HOSPITAL_COMMUNITY): Payer: BC Managed Care – PPO

## 2012-09-04 ENCOUNTER — Encounter (HOSPITAL_BASED_OUTPATIENT_CLINIC_OR_DEPARTMENT_OTHER): Payer: BC Managed Care – PPO

## 2012-09-04 VITALS — BP 129/80 | HR 94 | Temp 98.4°F | Resp 16 | Wt 199.4 lb

## 2012-09-04 DIAGNOSIS — Z5189 Encounter for other specified aftercare: Secondary | ICD-10-CM

## 2012-09-04 DIAGNOSIS — Z5111 Encounter for antineoplastic chemotherapy: Secondary | ICD-10-CM

## 2012-09-04 DIAGNOSIS — C8190 Hodgkin lymphoma, unspecified, unspecified site: Secondary | ICD-10-CM

## 2012-09-04 DIAGNOSIS — C8119 Nodular sclerosis classical Hodgkin lymphoma, extranodal and solid organ sites: Secondary | ICD-10-CM

## 2012-09-04 LAB — COMPREHENSIVE METABOLIC PANEL
ALT: 27 U/L (ref 0–35)
AST: 15 U/L (ref 0–37)
Albumin: 3.7 g/dL (ref 3.5–5.2)
Alkaline Phosphatase: 48 U/L (ref 39–117)
BUN: 23 mg/dL (ref 6–23)
CO2: 27 mEq/L (ref 19–32)
Calcium: 9.5 mg/dL (ref 8.4–10.5)
Chloride: 98 mEq/L (ref 96–112)
Creatinine, Ser: 0.69 mg/dL (ref 0.50–1.10)
GFR calc Af Amer: >90 mL/min (ref >90)
GFR calc non Af Amer: >90 mL/min (ref >90)
Glucose, Bld: 144 mg/dL — ABNORMAL HIGH (ref 70–99)
Potassium: 3.6 mEq/L (ref 3.5–5.1)
Sodium: 134 mEq/L — ABNORMAL LOW (ref 135–145)
Total Bilirubin: 0.6 mg/dL (ref 0.3–1.2)
Total Protein: 6.6 g/dL (ref 6.0–8.3)

## 2012-09-04 LAB — CBC WITH DIFFERENTIAL/PLATELET
Basophils Absolute: 0 10*3/uL (ref 0.0–0.1)
Basophils Relative: 1 % (ref 0–1)
Eosinophils Absolute: 0 10*3/uL (ref 0.0–0.7)
Eosinophils Relative: 1 % (ref 0–5)
HCT: 31.1 % — ABNORMAL LOW (ref 36.0–46.0)
Hemoglobin: 10.9 g/dL — ABNORMAL LOW (ref 12.0–15.0)
Lymphocytes Relative: 11 % — ABNORMAL LOW (ref 12–46)
Lymphs Abs: 0.2 10*3/uL — ABNORMAL LOW (ref 0.7–4.0)
MCH: 31.9 pg (ref 26.0–34.0)
MCHC: 35 g/dL (ref 30.0–36.0)
MCV: 90.9 fL (ref 78.0–100.0)
Monocytes Absolute: 0 10*3/uL — ABNORMAL LOW (ref 0.1–1.0)
Monocytes Relative: 0 % — ABNORMAL LOW (ref 3–12)
Neutro Abs: 1.2 10*3/uL — ABNORMAL LOW (ref 1.7–7.7)
Neutrophils Relative %: 88 % — ABNORMAL HIGH (ref 43–77)
Platelets: 123 10*3/uL — ABNORMAL LOW (ref 150–400)
RBC: 3.42 MIL/uL — ABNORMAL LOW (ref 3.87–5.11)
RDW: 12.9 % (ref 11.5–15.5)
WBC: 1.4 10*3/uL — CL (ref 4.0–10.5)

## 2012-09-04 MED ORDER — VINCRISTINE SULFATE CHEMO INJECTION 1 MG/ML
2.0000 mg | Freq: Once | INTRAVENOUS | Status: AC
  Administered 2012-09-04: 2 mg via INTRAVENOUS
  Filled 2012-09-04: qty 2

## 2012-09-04 MED ORDER — FILGRASTIM 480 MCG/1.6ML IJ SOLN
480.0000 ug | Freq: Once | INTRAMUSCULAR | Status: AC
  Administered 2012-09-04: 480 ug via SUBCUTANEOUS
  Filled 2012-09-04: qty 1.6

## 2012-09-04 MED ORDER — SODIUM CHLORIDE 0.9 % IJ SOLN
10.0000 mL | INTRAMUSCULAR | Status: DC | PRN
  Administered 2012-09-04: 10 mL via INTRAVENOUS
  Filled 2012-09-04: qty 10

## 2012-09-04 MED ORDER — SODIUM CHLORIDE 0.9 % IV SOLN
10.0000 [IU]/m2 | Freq: Once | INTRAVENOUS | Status: AC
  Administered 2012-09-04: 21 [IU] via INTRAVENOUS
  Filled 2012-09-04: qty 7

## 2012-09-04 MED ORDER — HEPARIN SOD (PORK) LOCK FLUSH 100 UNIT/ML IV SOLN
500.0000 [IU] | Freq: Once | INTRAVENOUS | Status: AC
  Administered 2012-09-04: 500 [IU] via INTRAVENOUS
  Filled 2012-09-04: qty 5

## 2012-09-04 MED ORDER — SODIUM CHLORIDE 0.9 % IV SOLN
Freq: Once | INTRAVENOUS | Status: AC
  Administered 2012-09-04: 8 mg via INTRAVENOUS
  Filled 2012-09-04: qty 4

## 2012-09-04 MED ORDER — HEPARIN SOD (PORK) LOCK FLUSH 100 UNIT/ML IV SOLN
INTRAVENOUS | Status: AC
  Filled 2012-09-04: qty 5

## 2012-09-04 MED ORDER — SODIUM CHLORIDE 0.9 % IV SOLN
INTRAVENOUS | Status: DC
  Administered 2012-09-04: 10:00:00 via INTRAVENOUS

## 2012-09-04 NOTE — Progress Notes (Signed)
CRITICAL VALUE ALERT Critical value received:  WBC 1.4 Date of notification:  09/04/2012  Time of notification: 0934 Critical value read back:  yes Nurse who received alert:  Payton Doughty, RN MD notified (1st page):  Dr. Mariel Sleet  09/04/2012 9:37 AM Per Dr. Mariel Sleet, patient can continue with treatment today - advised that patient needs to implement neutropenic precautions.  Continue with Neupogen today as ordered.  Report of same given to Sherley Bounds, RN, patient's treatment nurse.

## 2012-09-05 ENCOUNTER — Ambulatory Visit (HOSPITAL_COMMUNITY): Payer: BC Managed Care – PPO

## 2012-09-05 ENCOUNTER — Encounter (HOSPITAL_BASED_OUTPATIENT_CLINIC_OR_DEPARTMENT_OTHER): Payer: BC Managed Care – PPO

## 2012-09-05 VITALS — BP 133/77 | HR 77 | Temp 98.0°F | Resp 16

## 2012-09-05 DIAGNOSIS — Z5189 Encounter for other specified aftercare: Secondary | ICD-10-CM

## 2012-09-05 DIAGNOSIS — C8119 Nodular sclerosis classical Hodgkin lymphoma, extranodal and solid organ sites: Secondary | ICD-10-CM

## 2012-09-05 DIAGNOSIS — C8190 Hodgkin lymphoma, unspecified, unspecified site: Secondary | ICD-10-CM

## 2012-09-05 MED ORDER — FILGRASTIM 480 MCG/0.8ML IJ SOLN
480.0000 ug | Freq: Once | INTRAMUSCULAR | Status: AC
  Administered 2012-09-05: 480 ug via SUBCUTANEOUS

## 2012-09-05 MED ORDER — PEGFILGRASTIM INJECTION 6 MG/0.6ML
SUBCUTANEOUS | Status: AC
  Filled 2012-09-05: qty 0.6

## 2012-09-05 NOTE — Progress Notes (Signed)
[name] [name] presents today for injection per MD orders. Neupogen 480 mcg administered SQ in right Abdomen. Administration without incident. Patient tolerated well.  

## 2012-09-06 ENCOUNTER — Encounter (HOSPITAL_BASED_OUTPATIENT_CLINIC_OR_DEPARTMENT_OTHER): Payer: BC Managed Care – PPO

## 2012-09-06 ENCOUNTER — Ambulatory Visit (HOSPITAL_COMMUNITY): Payer: BC Managed Care – PPO

## 2012-09-06 DIAGNOSIS — Z5189 Encounter for other specified aftercare: Secondary | ICD-10-CM

## 2012-09-06 DIAGNOSIS — C8190 Hodgkin lymphoma, unspecified, unspecified site: Secondary | ICD-10-CM

## 2012-09-06 DIAGNOSIS — C8119 Nodular sclerosis classical Hodgkin lymphoma, extranodal and solid organ sites: Secondary | ICD-10-CM

## 2012-09-06 MED ORDER — FILGRASTIM 480 MCG/1.6ML IJ SOLN
480.0000 ug | Freq: Once | INTRAMUSCULAR | Status: AC
  Administered 2012-09-06: 480 ug via SUBCUTANEOUS
  Filled 2012-09-06: qty 1.6

## 2012-09-06 NOTE — Progress Notes (Signed)
[name] [name] presents today for injection per MD orders. Neupogen 480 administered SQ in left Abdomen. Administration without incident. Patient tolerated well.

## 2012-09-07 ENCOUNTER — Encounter (HOSPITAL_BASED_OUTPATIENT_CLINIC_OR_DEPARTMENT_OTHER): Payer: BC Managed Care – PPO

## 2012-09-07 VITALS — BP 126/83 | HR 116 | Temp 98.0°F | Resp 16

## 2012-09-07 DIAGNOSIS — C8190 Hodgkin lymphoma, unspecified, unspecified site: Secondary | ICD-10-CM

## 2012-09-07 DIAGNOSIS — C8119 Nodular sclerosis classical Hodgkin lymphoma, extranodal and solid organ sites: Secondary | ICD-10-CM

## 2012-09-07 DIAGNOSIS — Z5189 Encounter for other specified aftercare: Secondary | ICD-10-CM

## 2012-09-07 MED ORDER — FILGRASTIM 480 MCG/0.8ML IJ SOLN
480.0000 ug | Freq: Once | INTRAMUSCULAR | Status: DC
  Administered 2012-09-07: 480 ug via SUBCUTANEOUS

## 2012-09-07 NOTE — Progress Notes (Signed)
Mayah L Grattan presents today for injection per MD orders. Neupogen 480 mcg administered SQ in right Abdomen. Administration without incident. Patient tolerated well.  

## 2012-09-08 ENCOUNTER — Encounter (HOSPITAL_BASED_OUTPATIENT_CLINIC_OR_DEPARTMENT_OTHER): Payer: BC Managed Care – PPO

## 2012-09-08 VITALS — BP 116/80 | HR 109 | Temp 98.1°F | Resp 18

## 2012-09-08 DIAGNOSIS — C8119 Nodular sclerosis classical Hodgkin lymphoma, extranodal and solid organ sites: Secondary | ICD-10-CM

## 2012-09-08 DIAGNOSIS — Z5189 Encounter for other specified aftercare: Secondary | ICD-10-CM

## 2012-09-08 DIAGNOSIS — C8190 Hodgkin lymphoma, unspecified, unspecified site: Secondary | ICD-10-CM

## 2012-09-08 MED ORDER — FILGRASTIM 480 MCG/1.6ML IJ SOLN
480.0000 ug | Freq: Once | INTRAMUSCULAR | Status: AC
  Administered 2012-09-08: 480 ug via SUBCUTANEOUS
  Filled 2012-09-08: qty 1.6

## 2012-09-08 NOTE — Progress Notes (Signed)
[name] [name] presents today for injection per MD orders. Neupogen 480 mcg administered SQ in left Abdomen. Administration without incident. Patient tolerated well.  

## 2012-09-09 ENCOUNTER — Encounter (HOSPITAL_BASED_OUTPATIENT_CLINIC_OR_DEPARTMENT_OTHER): Payer: BC Managed Care – PPO

## 2012-09-09 ENCOUNTER — Encounter (HOSPITAL_COMMUNITY): Payer: Self-pay | Admitting: Oncology

## 2012-09-09 ENCOUNTER — Ambulatory Visit (HOSPITAL_COMMUNITY): Payer: BC Managed Care – PPO | Admitting: Oncology

## 2012-09-09 ENCOUNTER — Encounter (HOSPITAL_BASED_OUTPATIENT_CLINIC_OR_DEPARTMENT_OTHER): Payer: BC Managed Care – PPO | Admitting: Oncology

## 2012-09-09 VITALS — BP 125/64 | HR 107 | Temp 97.9°F | Resp 16 | Wt 201.2 lb

## 2012-09-09 DIAGNOSIS — R5381 Other malaise: Secondary | ICD-10-CM

## 2012-09-09 DIAGNOSIS — Z5189 Encounter for other specified aftercare: Secondary | ICD-10-CM

## 2012-09-09 DIAGNOSIS — C8119 Nodular sclerosis classical Hodgkin lymphoma, extranodal and solid organ sites: Secondary | ICD-10-CM

## 2012-09-09 DIAGNOSIS — C8190 Hodgkin lymphoma, unspecified, unspecified site: Secondary | ICD-10-CM

## 2012-09-09 MED ORDER — FILGRASTIM 480 MCG/1.6ML IJ SOLN
480.0000 ug | Freq: Once | INTRAMUSCULAR | Status: AC
  Administered 2012-09-09: 480 ug via SUBCUTANEOUS
  Filled 2012-09-09: qty 1.6

## 2012-09-09 MED FILL — Filgrastim Inj 480 MCG/1.6ML (300 MCG/ML): INTRAMUSCULAR | Qty: 1.6 | Status: AC

## 2012-09-09 NOTE — Patient Instructions (Addendum)
[facility] Discharge Instructions  RECOMMENDATIONS MADE BY THE CONSULTANT AND ANY TEST RESULTS WILL BE SENT TO YOUR REFERRING PHYSICIAN.  EXAM FINDINGS BY THE PHYSICIAN TODAY AND SIGNS OR SYMPTOMS TO REPORT TO CLINIC OR PRIMARY PHYSICIAN: Exam and discussion by MD.  Bonita Quin are doing well.  Cloudiness of thinking is probably related to the steroids that you are taking.  We are looking for complete response.  Once chemotherapy is completed you will get radiation therapy.  PET scan to be done on 4/29 at [facility].  Arrival time is 7:45am.  Nothing to eat or drink after midnight and no gum, mints, hard candy, etc - nothing with sugar.  2 D Echo to be done on 5/1 at 8:30am at [facility].  MEDICATIONS PRESCRIBED:  Increase your zantac to twice daily. Once you are off the steroids you can take advil  3 - 5 times daily.  INSTRUCTIONS GIVEN AND DISCUSSED: Report fevers, chills, uncontrolled nausea , vomiting or other problems.   SPECIAL INSTRUCTIONS/FOLLOW-UP: Next cycle of chemotherapy to start 4/9, PET scan on 4/19, to see MD on 4/29 and 2D ECHO on 5/1.  Thank you for choosing [name] to provide your oncology and hematology care.  To afford each patient quality time with our providers, please arrive at least 15 minutes before your scheduled appointment time.  With your help, our goal is to use those 15 minutes to complete the necessary work-up to ensure our physicians have the information they need to help with your evaluation and healthcare recommendations.    Effective January 1st, 2014, we ask that you re-schedule your appointment with our physicians should you arrive 10 or more minutes late for your appointment.  We strive to give you quality time with our providers, and arriving late affects you and other patients whose appointments are after yours.    Again, thank you for choosing [facility].  Our hope is that these requests  will decrease the amount of time that you wait before being seen by our physicians.       _____________________________________________________________  Should you have questions after your visit to [facility], please contact our office at [phone] between the hours of 8:30 a.m. and 5:00 p.m.  Voicemails left after 4:30 p.m. will not be returned until the following business day.  For prescription refill requests, have your pharmacy contact our office with your prescription refill request.

## 2012-09-09 NOTE — Progress Notes (Signed)
[name] [name] presents today for injection per MD orders. Neupogen 480 mcg administered SQ in right Abdomen. Administration without incident. Patient tolerated well.

## 2012-09-09 NOTE — Progress Notes (Signed)
Problem #1 stage II A. bulky Hodgkin's lymphoma, nodular sclerosing type, presenting with a large anterior mediastinal mass and left cervical and right cervical lymph nodes status post 5 cycles of ABVD but with incomplete response both after cycles 3 and cycles 5. We therefore switched her to dose adjusted BEACOPP. She is finishing her last day of Neupogen tomorrow. She is not short of breath but very very tired. She does not think she can work going forward and I am not surprised. She feels like she is in a mental fog at times but she is alert and very oriented. She states she has trouble focusing and that may be the high-dose steroids part of this regimen. I think her issues of weakness and fatigue by the cumulative effects of chemotherapy in general but this is a very intense regimen.  She is due to start her second and final cycle of BEACOPP April 9. We will hospitalize her again for 3 days to make sure we give her plenty of fluids to safeguard her bladder.  She also may need more anti-emetics due to this regimen in intensity.  She is taking Zantac 150 mg a day and I have asked her to double that dose 250 mg twice a day. In spite of high dose prednisone she is still having muscle aching in bone aching. We will not let her use Advil until she is off the steroids. She is notto use more than 2 regular strength ibuprofen 4 times for 3 days, maximum 4 days. She denies any trouble breathing. She is not wheezing. She does not have fevers chills or night sweats. She has numbness in her fingers and toes and around her lips which is most likely from the Vinca alkaloids both past and present.  BP 125/64  Pulse 107  Temp(Src) 97.9 F (36.6 C) (Oral)  Resp 16  Wt 201 lb 3.2 oz (91.264 kg)  BMI 32.49 kg/m2  She looks very fatigued. Her color is pale. Nails are slightly pale as well. She has no palpable lymphadenopathy in the cervical, supraclavicular, infraclavicular, axillary, or inguinal areas. She has no  hepatosplenomegaly. Bowel sounds are diminished but present. Her heart shows a regular rhythm and rate without murmur rub or gallop. Port-A-Cath in the left upper chest wall is intact. Her lungs are clear to auscultation and percussion both anteriorly and posteriorly. Skin exam is benign except for crackles. She has no leg edema and no arm edema.  She is alert and oriented.  I don't know she can get disability. She will discuss this with her HR person at work.  We will schedule her for repeat PET scan and 2-D echo. I will see her the same day as the PET scan.  Marland Kitchen

## 2012-09-10 ENCOUNTER — Other Ambulatory Visit (HOSPITAL_COMMUNITY): Payer: Self-pay | Admitting: Oncology

## 2012-09-10 ENCOUNTER — Encounter (HOSPITAL_COMMUNITY): Payer: BC Managed Care – PPO | Attending: Oncology

## 2012-09-10 VITALS — BP 134/90 | HR 111 | Temp 98.5°F | Resp 16

## 2012-09-10 DIAGNOSIS — C8190 Hodgkin lymphoma, unspecified, unspecified site: Secondary | ICD-10-CM

## 2012-09-10 DIAGNOSIS — Z5189 Encounter for other specified aftercare: Secondary | ICD-10-CM

## 2012-09-10 DIAGNOSIS — C8119 Nodular sclerosis classical Hodgkin lymphoma, extranodal and solid organ sites: Secondary | ICD-10-CM

## 2012-09-10 MED ORDER — FILGRASTIM 480 MCG/1.6ML IJ SOLN
480.0000 ug | Freq: Once | INTRAMUSCULAR | Status: DC
  Filled 2012-09-10: qty 1.6

## 2012-09-10 MED ORDER — FILGRASTIM 480 MCG/0.8ML IJ SOLN
480.0000 ug | Freq: Once | INTRAMUSCULAR | Status: DC
  Administered 2012-09-10: 480 ug via SUBCUTANEOUS

## 2012-09-10 NOTE — Progress Notes (Signed)
[name] [name] presents today for injection per MD orders. Neupogen 480 mcg administered SQ in left Abdomen. Administration without incident. Patient tolerated well.

## 2012-09-11 ENCOUNTER — Encounter: Payer: Self-pay | Admitting: Oncology

## 2012-09-11 ENCOUNTER — Other Ambulatory Visit (HOSPITAL_COMMUNITY): Payer: Self-pay | Admitting: Oncology

## 2012-09-11 MED FILL — Filgrastim Inj 480 MCG/1.6ML (300 MCG/ML): INTRAMUSCULAR | Qty: 1.6 | Status: AC

## 2012-09-12 ENCOUNTER — Other Ambulatory Visit (HOSPITAL_COMMUNITY): Payer: Self-pay | Admitting: Oncology

## 2012-09-12 ENCOUNTER — Telehealth (HOSPITAL_COMMUNITY): Payer: Self-pay | Admitting: Oncology

## 2012-09-12 DIAGNOSIS — R05 Cough: Secondary | ICD-10-CM

## 2012-09-12 DIAGNOSIS — R059 Cough, unspecified: Secondary | ICD-10-CM

## 2012-09-12 MED ORDER — HYDROCOD POLST-CHLORPHEN POLST 10-8 MG/5ML PO LQCR: 5.0000 mL | Freq: Two times a day (BID) | ORAL | Status: DC | PRN

## 2012-09-13 ENCOUNTER — Telehealth (HOSPITAL_COMMUNITY): Payer: Self-pay

## 2012-09-13 ENCOUNTER — Other Ambulatory Visit (HOSPITAL_COMMUNITY): Payer: Self-pay | Admitting: *Deleted

## 2012-09-13 DIAGNOSIS — G4700 Insomnia, unspecified: Secondary | ICD-10-CM

## 2012-09-13 DIAGNOSIS — C8190 Hodgkin lymphoma, unspecified, unspecified site: Secondary | ICD-10-CM

## 2012-09-13 MED ORDER — TEMAZEPAM 15 MG PO CAPS: 15.0000 mg | ORAL_CAPSULE | Freq: Every evening | ORAL | Status: DC | PRN

## 2012-09-13 NOTE — Telephone Encounter (Signed)
Call back from [place] regarding restoril.  On days she take prednisone she takes three 15 mg capsules in order to get any sleep.  On other nights she does ok with less.  Only has about 5 pills left.

## 2012-09-13 NOTE — Progress Notes (Signed)
Discussed with Tom and Called in 2 refills on restoril 15 mg # 45 with instructions to take 2-3 at bedtime as needed for sleep

## 2012-09-13 NOTE — Telephone Encounter (Signed)
Message left for patient to call back regarding refill for restoril to let us know how much she is taking.

## 2012-09-18 ENCOUNTER — Inpatient Hospital Stay (HOSPITAL_COMMUNITY)
Admission: AD | Admit: 2012-09-18 | Discharge: 2012-09-20 | DRG: 395 | Disposition: A | Discharge status: Home / Self Care | Payer: BC Managed Care – PPO | Attending: Internal Medicine | Admitting: Internal Medicine

## 2012-09-18 ENCOUNTER — Encounter (HOSPITAL_BASED_OUTPATIENT_CLINIC_OR_DEPARTMENT_OTHER): Payer: BC Managed Care – PPO

## 2012-09-18 ENCOUNTER — Encounter (HOSPITAL_COMMUNITY): Payer: Self-pay | Admitting: *Deleted

## 2012-09-18 VITALS — BP 125/79 | HR 109 | Temp 98.6°F | Resp 16 | Wt 198.2 lb

## 2012-09-18 DIAGNOSIS — C8190 Hodgkin lymphoma, unspecified, unspecified site: Secondary | ICD-10-CM | POA: Diagnosis present

## 2012-09-18 DIAGNOSIS — F329 Major depressive disorder, single episode, unspecified: Secondary | ICD-10-CM | POA: Diagnosis present

## 2012-09-18 DIAGNOSIS — Z882 Allergy status to sulfonamides status: Secondary | ICD-10-CM

## 2012-09-18 DIAGNOSIS — E669 Obesity, unspecified: Secondary | ICD-10-CM | POA: Diagnosis present

## 2012-09-18 DIAGNOSIS — L309 Dermatitis, unspecified: Secondary | ICD-10-CM

## 2012-09-18 DIAGNOSIS — F32A Depression, unspecified: Secondary | ICD-10-CM

## 2012-09-18 DIAGNOSIS — Z801 Family history of malignant neoplasm of trachea, bronchus and lung: Secondary | ICD-10-CM

## 2012-09-18 DIAGNOSIS — Z79899 Other long term (current) drug therapy: Secondary | ICD-10-CM

## 2012-09-18 DIAGNOSIS — IMO0001 Reserved for inherently not codable concepts without codable children: Secondary | ICD-10-CM | POA: Diagnosis present

## 2012-09-18 DIAGNOSIS — M797 Fibromyalgia: Secondary | ICD-10-CM

## 2012-09-18 DIAGNOSIS — L259 Unspecified contact dermatitis, unspecified cause: Secondary | ICD-10-CM | POA: Diagnosis present

## 2012-09-18 DIAGNOSIS — Z9221 Personal history of antineoplastic chemotherapy: Secondary | ICD-10-CM

## 2012-09-18 DIAGNOSIS — T451X5A Adverse effect of antineoplastic and immunosuppressive drugs, initial encounter: Secondary | ICD-10-CM | POA: Diagnosis present

## 2012-09-18 DIAGNOSIS — C8119 Nodular sclerosis classical Hodgkin lymphoma, extranodal and solid organ sites: Secondary | ICD-10-CM

## 2012-09-18 DIAGNOSIS — D6481 Anemia due to antineoplastic chemotherapy: Principal | ICD-10-CM | POA: Diagnosis present

## 2012-09-18 DIAGNOSIS — Z5111 Encounter for antineoplastic chemotherapy: Secondary | ICD-10-CM

## 2012-09-18 DIAGNOSIS — G2581 Restless legs syndrome: Secondary | ICD-10-CM

## 2012-09-18 DIAGNOSIS — G4730 Sleep apnea, unspecified: Secondary | ICD-10-CM | POA: Diagnosis present

## 2012-09-18 DIAGNOSIS — I10 Essential (primary) hypertension: Secondary | ICD-10-CM | POA: Diagnosis present

## 2012-09-18 DIAGNOSIS — R0600 Dyspnea, unspecified: Secondary | ICD-10-CM

## 2012-09-18 DIAGNOSIS — Z6836 Body mass index (BMI) 36.0-36.9, adult: Secondary | ICD-10-CM

## 2012-09-18 DIAGNOSIS — J9859 Other diseases of mediastinum, not elsewhere classified: Secondary | ICD-10-CM

## 2012-09-18 DIAGNOSIS — F3289 Other specified depressive episodes: Secondary | ICD-10-CM | POA: Diagnosis present

## 2012-09-18 DIAGNOSIS — G622 Polyneuropathy due to other toxic agents: Secondary | ICD-10-CM | POA: Diagnosis present

## 2012-09-18 LAB — CBC WITH DIFFERENTIAL/PLATELET
Basophils Absolute: 0 10*3/uL (ref 0.0–0.1)
Basophils Relative: 0 % (ref 0–1)
Eosinophils Absolute: 0 10*3/uL (ref 0.0–0.7)
Eosinophils Relative: 0 % (ref 0–5)
HCT: 29.6 % — ABNORMAL LOW (ref 36.0–46.0)
Hemoglobin: 10.3 g/dL — ABNORMAL LOW (ref 12.0–15.0)
Lymphocytes Relative: 3 % — ABNORMAL LOW (ref 12–46)
Lymphs Abs: 0.2 10*3/uL — ABNORMAL LOW (ref 0.7–4.0)
MCH: 32.4 pg (ref 26.0–34.0)
MCHC: 34.8 g/dL (ref 30.0–36.0)
MCV: 93.1 fL (ref 78.0–100.0)
Monocytes Absolute: 0.1 10*3/uL (ref 0.1–1.0)
Monocytes Relative: 2 % — ABNORMAL LOW (ref 3–12)
Neutro Abs: 6.2 10*3/uL (ref 1.7–7.7)
Neutrophils Relative %: 95 % — ABNORMAL HIGH (ref 43–77)
Platelets: 281 10*3/uL (ref 150–400)
RBC: 3.18 MIL/uL — ABNORMAL LOW (ref 3.87–5.11)
RDW: 15.3 % (ref 11.5–15.5)
WBC: 6.5 10*3/uL (ref 4.0–10.5)

## 2012-09-18 LAB — URINALYSIS, ROUTINE W REFLEX MICROSCOPIC
Bilirubin Urine: NEGATIVE
Glucose, UA: 100 mg/dL — AB
Hgb urine dipstick: NEGATIVE
Ketones, ur: NEGATIVE mg/dL
Leukocytes, UA: NEGATIVE
Nitrite: NEGATIVE
Protein, ur: NEGATIVE mg/dL
Specific Gravity, Urine: >1.03 — ABNORMAL HIGH (ref 1.005–1.030)
Urobilinogen, UA: 0.2 mg/dL (ref 0.0–1.0)
pH: 5.5 (ref 5.0–8.0)

## 2012-09-18 LAB — COMPREHENSIVE METABOLIC PANEL
ALT: 22 U/L (ref 0–35)
AST: 20 U/L (ref 0–37)
Albumin: 3.6 g/dL (ref 3.5–5.2)
Alkaline Phosphatase: 59 U/L (ref 39–117)
BUN: 16 mg/dL (ref 6–23)
CO2: 24 mEq/L (ref 19–32)
Calcium: 9.5 mg/dL (ref 8.4–10.5)
Chloride: 101 mEq/L (ref 96–112)
Creatinine, Ser: 0.67 mg/dL (ref 0.50–1.10)
GFR calc Af Amer: >90 mL/min (ref >90)
GFR calc non Af Amer: >90 mL/min (ref >90)
Glucose, Bld: 144 mg/dL — ABNORMAL HIGH (ref 70–99)
Potassium: 3.5 mEq/L (ref 3.5–5.1)
Sodium: 136 mEq/L (ref 135–145)
Total Bilirubin: 0.3 mg/dL (ref 0.3–1.2)
Total Protein: 6.8 g/dL (ref 6.0–8.3)

## 2012-09-18 LAB — SEDIMENTATION RATE: Sed Rate: 22 mm/hr (ref 0–22)

## 2012-09-18 LAB — LACTATE DEHYDROGENASE: LDH: 233 U/L (ref 94–250)

## 2012-09-18 MED ORDER — HEPARIN SOD (PORK) LOCK FLUSH 100 UNIT/ML IV SOLN
500.0000 [IU] | Freq: Once | INTRAVENOUS | Status: DC | PRN
  Filled 2012-09-18: qty 5

## 2012-09-18 MED ORDER — SODIUM CHLORIDE 0.9 % IJ SOLN
10.0000 mL | INTRAMUSCULAR | Status: DC | PRN
  Administered 2012-09-18: 10 mL
  Filled 2012-09-18: qty 10

## 2012-09-18 MED ORDER — OXYCODONE HCL 5 MG PO TABS: 5.0000 mg | ORAL_TABLET | ORAL | Status: DC | PRN

## 2012-09-18 MED ORDER — ACYCLOVIR 200 MG PO CAPS
ORAL_CAPSULE | ORAL | Status: AC
  Filled 2012-09-18: qty 2

## 2012-09-18 MED ORDER — PREDNISONE 20 MG PO TABS
80.0000 mg | ORAL_TABLET | Freq: Every day | ORAL | Status: DC
  Administered 2012-09-19 – 2012-09-20 (×2): 80 mg via ORAL
  Filled 2012-09-18 (×2): qty 4

## 2012-09-18 MED ORDER — KCL IN DEXTROSE-NACL 20-5-0.45 MEQ/L-%-% IV SOLN
INTRAVENOUS | Status: DC
  Administered 2012-09-18 – 2012-09-20 (×5): via INTRAVENOUS

## 2012-09-18 MED ORDER — NORETHIN ACE-ETH ESTRAD-FE 1-20 MG-MCG PO TABS
1.0000 | ORAL_TABLET | Freq: Every day | ORAL | Status: DC
  Administered 2012-09-20: 1 via ORAL

## 2012-09-18 MED ORDER — SODIUM CHLORIDE 0.9 % IV SOLN
200.0000 mg/m2 | Freq: Once | INTRAVENOUS | Status: AC
  Administered 2012-09-18: 420 mg via INTRAVENOUS
  Filled 2012-09-18: qty 21

## 2012-09-18 MED ORDER — POTASSIUM CHLORIDE CRYS ER 10 MEQ PO TBCR
10.0000 meq | EXTENDED_RELEASE_TABLET | Freq: Every day | ORAL | Status: DC
  Administered 2012-09-19 – 2012-09-20 (×2): 10 meq via ORAL
  Filled 2012-09-18 (×2): qty 1

## 2012-09-18 MED ORDER — PALONOSETRON HCL INJECTION 0.25 MG/5ML
INTRAVENOUS | Status: AC
  Filled 2012-09-18: qty 5

## 2012-09-18 MED ORDER — PALONOSETRON HCL INJECTION 0.25 MG/5ML
0.2500 mg | Freq: Once | INTRAVENOUS | Status: AC
  Administered 2012-09-18: 0.25 mg via INTRAVENOUS

## 2012-09-18 MED ORDER — HYDROMORPHONE HCL PF 1 MG/ML IJ SOLN: 0.5000 mg | INTRAMUSCULAR | Status: DC | PRN

## 2012-09-18 MED ORDER — TIZANIDINE HCL 2 MG PO TABS
4.0000 mg | ORAL_TABLET | Freq: Every day | ORAL | Status: DC
  Administered 2012-09-19: 4 mg via ORAL
  Filled 2012-09-18 (×2): qty 3

## 2012-09-18 MED ORDER — FLEET ENEMA 7-19 GM/118ML RE ENEM: 1.0000 | ENEMA | Freq: Once | RECTAL | Status: AC | PRN

## 2012-09-18 MED ORDER — DOXORUBICIN HCL CHEMO IV INJECTION 2 MG/ML
35.0000 mg/m2 | Freq: Once | INTRAVENOUS | Status: AC
  Administered 2012-09-18: 72 mg via INTRAVENOUS
  Filled 2012-09-18: qty 36

## 2012-09-18 MED ORDER — ONDANSETRON 8 MG/NS 50 ML IVPB
8.0000 mg | Freq: Three times a day (TID) | INTRAVENOUS | Status: DC | PRN
  Filled 2012-09-18: qty 8

## 2012-09-18 MED ORDER — SODIUM CHLORIDE 0.9 % IV SOLN
Freq: Once | INTRAVENOUS | Status: AC
  Administered 2012-09-18: 11:00:00 via INTRAVENOUS
  Filled 2012-09-18: qty 5

## 2012-09-18 MED ORDER — PREDNISONE 50 MG PO TABS
80.0000 mg | ORAL_TABLET | Freq: Every day | ORAL | Status: DC
  Filled 2012-09-18: qty 1

## 2012-09-18 MED ORDER — SORBITOL 70 % SOLN: 30.0000 mL | Freq: Every day | Status: DC | PRN

## 2012-09-18 MED ORDER — ACYCLOVIR 400 MG PO TABS
400.0000 mg | ORAL_TABLET | Freq: Two times a day (BID) | ORAL | Status: DC
  Filled 2012-09-18 (×4): qty 1

## 2012-09-18 MED ORDER — TEMAZEPAM 15 MG PO CAPS
45.0000 mg | ORAL_CAPSULE | Freq: Every evening | ORAL | Status: DC | PRN
  Administered 2012-09-18 – 2012-09-19 (×2): 45 mg via ORAL
  Filled 2012-09-18 (×2): qty 3

## 2012-09-18 MED ORDER — OMEGA-3-ACID ETHYL ESTERS 1 G PO CAPS
1.0000 g | ORAL_CAPSULE | Freq: Every day | ORAL | Status: DC
  Filled 2012-09-18: qty 1

## 2012-09-18 MED ORDER — METHOCARBAMOL 500 MG PO TABS
500.0000 mg | ORAL_TABLET | Freq: Two times a day (BID) | ORAL | Status: DC
  Administered 2012-09-18 – 2012-09-20 (×4): 500 mg via ORAL
  Filled 2012-09-18 (×4): qty 1

## 2012-09-18 MED ORDER — TIZANIDINE HCL 4 MG PO TABS
ORAL_TABLET | ORAL | Status: AC
  Filled 2012-09-18: qty 2

## 2012-09-18 MED ORDER — PANTOPRAZOLE SODIUM 40 MG PO TBEC
40.0000 mg | DELAYED_RELEASE_TABLET | Freq: Two times a day (BID) | ORAL | Status: DC
  Administered 2012-09-19 – 2012-09-20 (×3): 40 mg via ORAL
  Filled 2012-09-18 (×3): qty 1

## 2012-09-18 MED ORDER — SODIUM CHLORIDE 0.9 % IV SOLN
INTRAVENOUS | Status: DC
  Administered 2012-09-18: 10:00:00 via INTRAVENOUS

## 2012-09-18 MED ORDER — ONDANSETRON HCL 4 MG/2ML IJ SOLN
INTRAMUSCULAR | Status: AC
  Filled 2012-09-18: qty 4

## 2012-09-18 MED ORDER — GABAPENTIN 300 MG PO CAPS
600.0000 mg | ORAL_CAPSULE | Freq: Every day | ORAL | Status: DC
  Administered 2012-09-19: 600 mg via ORAL
  Filled 2012-09-18: qty 2

## 2012-09-18 MED ORDER — ALLOPURINOL 300 MG PO TABS
300.0000 mg | ORAL_TABLET | Freq: Every day | ORAL | Status: DC
  Administered 2012-09-19 – 2012-09-20 (×2): 300 mg via ORAL
  Filled 2012-09-18 (×2): qty 1

## 2012-09-18 MED ORDER — ENOXAPARIN SODIUM 40 MG/0.4ML ~~LOC~~ SOLN
40.0000 mg | SUBCUTANEOUS | Status: DC
  Filled 2012-09-18: qty 0.4

## 2012-09-18 MED ORDER — LORAZEPAM 2 MG/ML IJ SOLN: 1.0000 mg | INTRAMUSCULAR | Status: DC | PRN

## 2012-09-18 MED ORDER — PROCARBAZINE HCL 50 MG PO CAPS
200.0000 mg | ORAL_CAPSULE | Freq: Every day | ORAL | Status: DC
  Administered 2012-09-20: 200 mg via ORAL

## 2012-09-18 MED ORDER — SODIUM CHLORIDE 0.9 % IV SOLN
1250.0000 mg/m2 | Freq: Once | INTRAVENOUS | Status: AC
  Administered 2012-09-18: 2600 mg via INTRAVENOUS
  Filled 2012-09-18: qty 130

## 2012-09-18 MED ORDER — ACETAMINOPHEN 500 MG PO TABS: 1000.0000 mg | ORAL_TABLET | Freq: Four times a day (QID) | ORAL | Status: DC | PRN

## 2012-09-18 NOTE — Progress Notes (Signed)
Tolerated chemo well. Has IV infusing to port access af NS at 175 hr.  D/C from clinic to Room 332 per care plan.

## 2012-09-18 NOTE — H&P (Signed)
[name] History and Physical  [name] [name]  WUJ:[id number]  DOB: 06-Jan-1971   DOA: 09/18/2012   PCP:   [name], MD   Chief Complaint:  Hodgkin's lymphoma receiving chemotherapy  HPI: [name] [name] is an 42 y.o. female.   Caucasian lady with stage IIA Hodgkin's currently on BEACOPP, admitted today at the request of Dr. Mariel Sleet for IV fluid hydration  during the initiation of treatment  She first started treatment for her lymphoma in October 2013; she started her first course of  BEACOPP on March 12  Rewiew of Systems:   All systems negative except as marked bold or noted in the HPI;  Constitutional:    malaise, fever and chills. ;  Eyes:   eye pain, redness and discharge. ; Dry eyes; blurred vision due to steroid  ENMT:   ear pain, hoarseness, nasal congestion, sinus pressure and sore throat. ;  Cardiovascular:    chest pain, palpitations, diaphoresis, dyspnea and peripheral edema.  Respiratory:   cough due to pentamidine, hemoptysis, wheezing and stridor. ;  Gastrointestinal:  nausea, vomiting, diarrhea, constipation, abdominal pain, melena, blood in stool, hematemesis, jaundice and rectal bleeding. unusual weight loss..   Genitourinary:    frequency, dysuria, incontinence,flank pain and hematuria; Musculoskeletal:   back pain and neck pain.  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  pruritus, rash, abrasions, bruising and skin lesion.; ulcerations Neuro:    headache, lightheadedness and neck stiffness.  weakness, altered level of consciousness, altered mental status, extremity weakness, burning feet, involuntary movement, seizure and syncope.  Psych:    anxiety, depression, insomnia, tearfulness, panic attacks, hallucinations, paranoia, suicidal or homicidal ideation    Past Medical History  Diagnosis Date  . Fibromyalgia   . Eczema   . Depression     parents died close together "was hard"  . Restless leg   .  Heart murmur     Told 'nothing to worry about"  No 2D Echo  . Sleep apnea     Sleep Study- 1996- "little apnea- no tx"  . Headache     Birth control pills help  . Hemorrhoid   . Anemia     As a child and while pregnant  . 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 Hypertension     recently dx with hodgkins  . Dyspnea     all the time .".due to the mass"    Past Surgical History  Procedure Laterality Date  . Wisdom tooth extraction    . Dilation and curettage of uterus  1991  . Mediastinal mass biopsy  03/05/12  . Bone marrow biopsy  03/20/2012  . Portacath placement  03/26/2012    Procedure: INSERTION PORT-A-CATH;  Surgeon: Loreli Slot, MD;  Location: [facility] OR;  Service: Thoracic;  Laterality: N/A;    Medications:  HOME MEDS: Prior to Admission medications   Medication Sig Start Date End Date Taking? Authorizing Provider  acetaminophen (TYLENOL) 500 MG tablet Take 1,000 mg by mouth every 6 (six) hours as needed for pain.   Yes Historical Provider, MD  acyclovir (ZOVIRAX) 400 MG tablet Take 400 mg by mouth 2 (two) times daily. Take throughout chemo.   Yes Historical Provider, MD  allopurinol (ZYLOPRIM) 300 MG tablet Take 1 tablet (300 mg total) by mouth daily. # 30 2 refills only (3 month supply total) 06/25/12  Yes Randall An, MD  ALPRAZolam Prudy Feeler) 1 MG  tablet Take 1/2 tablet to 1 tablet four times daily as needed for anxiety. 08/21/12  Yes Randall An, MD  cholecalciferol (VITAMIN D) 1000 UNITS tablet Take 1,000 Units by mouth daily.   Yes Historical Provider, MD  fish oil-omega-3 fatty acids 1000 MG capsule Take 1 g by mouth daily.   Yes Historical Provider, MD  gabapentin (NEURONTIN) 600 MG tablet Take 600 mg by mouth at bedtime.    Yes Historical Provider, MD  lidocaine-prilocaine (EMLA) cream Apply topically as needed. Apply a quarter sized amount to port site 1 hour prior to chemo. Do not rub in. Cover with plastic.   Yes Historical  Provider, MD  lisinopril-hydrochlorothiazide (PRINZIDE,ZESTORETIC) 20-12.5 MG per tablet Take 1 tablet by mouth daily.   Yes Historical Provider, MD  methocarbamol (ROBAXIN) 500 MG tablet Take 1 tablet (500 mg total) by mouth 2 (two) times daily. Refill given on 08/30/12 per Dr. Mariel Sleet. 08/30/12  Yes Randall An, MD  norethindrone-ethinyl estradiol (JUNEL FE,GILDESS FE,LOESTRIN FE) 1-20 MG-MCG tablet Take 1 tablet by mouth daily.   Yes Historical Provider, MD  ondansetron (ZOFRAN) 8 MG tablet Take 8 mg by mouth 3 (three) times daily as needed for nausea. Do not take until the 3rd day after chemotherapy due to the premeds (aloxi/emend) that we give you.   Yes Historical Provider, MD  potassium chloride (K-DUR,KLOR-CON) 10 MEQ tablet Take 10 mEq by mouth daily. 06/27/12  Yes Historical Provider, MD  predniSONE (DELTASONE) 20 MG tablet Take 80 mg by mouth daily. Days 1-14, then off 7 days. #48 1 refill 08/21/12  Yes Historical Provider, MD  procarbazine (MATULANE) 50 MG capsule Take 200 mg by mouth daily. Follow low tyramine diet., For seven days then off 14 days and then another seven day cycle.   Yes Historical Provider, MD  ranitidine (ZANTAC) 150 MG capsule Take 150 mg by mouth 2 (two) times daily.    Yes Historical Provider, MD  senna (SENOKOT) 8.6 MG TABS Take 1 tablet by mouth at bedtime.   Yes Historical Provider, MD  tiZANidine (ZANAFLEX) 4 MG capsule Take 4-6 mg by mouth at bedtime. Takes 4-6 mg dose depends on severity of spasms.   Yes Historical Provider, MD  chlorpheniramine-HYDROcodone (TUSSIONEX PENNKINETIC ER) 10-8 MG/5ML LQCR Take 5 mLs by mouth every 12 (twelve) hours as needed. 09/12/12   Ellouise Newer, PA-C  LORazepam (ATIVAN) 1 MG tablet Take 1 tablet by mouth at bedtime as needed. For nausea 06/11/12   Historical Provider, MD  temazepam (RESTORIL) 15 MG capsule Take 1 capsule (15 mg total) by mouth at bedtime as needed for sleep. 09/13/12   Ellouise Newer, PA-C      Allergies:  Allergies  Allergen Reactions  . Sulfur Anaphylaxis    Social History:   reports that she has never smoked. She has never used smokeless tobacco. She reports that she does not drink alcohol or use illicit drugs.  Family History: Family History  Problem Relation Age of Onset  . Cancer Paternal Uncle   . Cancer Maternal Grandmother     lung ca mets to throat     Physical Exam: Filed Vitals:   09/18/12 1739  BP: 131/89  Pulse: 92  Temp: 98.5 F (36.9 C)  TempSrc: Oral  Resp: 20  Height: 5\' 6"  (1.676 m)  Weight: 91.8 kg (202 lb 6.1 oz)  SpO2: 98%   Blood pressure 131/89, pulse 92, temperature 98.5 F (36.9 C), temperature source Oral, resp. rate 20, height  5\' 6"  (1.676 m), weight 91.8 kg (202 lb 6.1 oz), SpO2 98.00%.  GEN:  Pleasant Caucasian lady lying bed in no acute distress; cooperative with exam PSYCH:  alert and oriented x4;   Seems a little anxious; affect is appropriate. HEENT: Mucous membranes pink and anicteric; PERRLA; EOM intact; no JVD; Breasts:: Not examined CHEST WALL: No tenderness CHEST: Normal respiration, clear to auscultation bilaterally HEART: Regular rate and rhythm; no murmurs rubs or gallops BACK: No kyphosis no scoliosis; no CVA tenderness ABDOMEN: Obese, soft non-tender; no masses, no organomegaly, normal abdominal bowel sounds; no pannus; no intertriginous candida. Rectal Exam: Not done EXTREMITIES: No bone or joint deformity; ; no edema; no ulcerations. Genitalia: not examined PULSES: 2+ and symmetric SKIN: Normal hydration no rash or ulceration CNS: Cranial nerves 2-12 grossly intact no focal lateralizing neurologic deficit   Labs on Admission:  Basic Metabolic Panel:  Recent Labs Lab 09/18/12 0930  NA 136  K 3.5  CL 101  CO2 24  GLUCOSE 144*  BUN 16  CREATININE 0.67  CALCIUM 9.5   Liver Function Tests:  Recent Labs Lab 09/18/12 0930  AST 20  ALT 22  ALKPHOS 59  BILITOT 0.3  PROT 6.8  ALBUMIN 3.6    No results found for this basename: LIPASE, AMYLASE,  in the last 168 hours No results found for this basename: AMMONIA,  in the last 168 hours CBC:  Recent Labs Lab 09/18/12 0930  WBC 6.5  NEUTROABS 6.2  HGB 10.3*  HCT 29.6*  MCV 93.1  PLT 281   Cardiac Enzymes: No results found for this basename: CKTOTAL, CKMB, CKMBINDEX, TROPONINI,  in the last 168 hours BNP: No components found with this basename: POCBNP,  D-dimer: No components found with this basename: D-DIMER,  CBG: No results found for this basename: GLUCAP,  in the last 168 hours  Radiological Exams on Admission: No results found.     Assessment/Plan Present on Admission:  . Hodgkin's lymphoma  Will follow hydration protocol as outlined by her oncologist; ensure coverage for nausea and pain   Heart healthy low tyramine diet  . Hypertension . Fibromyalgia . Depression  Continue management of chronic medical condition Consult oncologist for assistance with management Other plans as per orders.  Code Status: FULL CODE  Family Communication: Communications with patient at Disposition Plan: Likely home in a few days    Alainah Phang Nocturnist [name] Pager [phone]   09/18/2012, 8:09 PM

## 2012-09-19 ENCOUNTER — Encounter (HOSPITAL_COMMUNITY): Payer: Self-pay | Admitting: Oncology

## 2012-09-19 ENCOUNTER — Inpatient Hospital Stay (HOSPITAL_BASED_OUTPATIENT_CLINIC_OR_DEPARTMENT_OTHER): Payer: BC Managed Care – PPO | Admitting: Oncology

## 2012-09-19 ENCOUNTER — Inpatient Hospital Stay (HOSPITAL_BASED_OUTPATIENT_CLINIC_OR_DEPARTMENT_OTHER): Payer: BC Managed Care – PPO

## 2012-09-19 VITALS — Wt 205.7 lb

## 2012-09-19 DIAGNOSIS — H04123 Dry eye syndrome of bilateral lacrimal glands: Secondary | ICD-10-CM

## 2012-09-19 DIAGNOSIS — C8190 Hodgkin lymphoma, unspecified, unspecified site: Secondary | ICD-10-CM

## 2012-09-19 DIAGNOSIS — Z5111 Encounter for antineoplastic chemotherapy: Secondary | ICD-10-CM

## 2012-09-19 DIAGNOSIS — C8119 Nodular sclerosis classical Hodgkin lymphoma, extranodal and solid organ sites: Secondary | ICD-10-CM

## 2012-09-19 DIAGNOSIS — H04129 Dry eye syndrome of unspecified lacrimal gland: Secondary | ICD-10-CM

## 2012-09-19 LAB — COMPREHENSIVE METABOLIC PANEL
ALT: 19 U/L (ref 0–35)
AST: 19 U/L (ref 0–37)
Albumin: 2.9 g/dL — ABNORMAL LOW (ref 3.5–5.2)
Alkaline Phosphatase: 45 U/L (ref 39–117)
BUN: 11 mg/dL (ref 6–23)
CO2: 21 mEq/L (ref 19–32)
Calcium: 8.7 mg/dL (ref 8.4–10.5)
Chloride: 104 mEq/L (ref 96–112)
Creatinine, Ser: 0.6 mg/dL (ref 0.50–1.10)
GFR calc Af Amer: >90 mL/min (ref >90)
GFR calc non Af Amer: >90 mL/min (ref >90)
Glucose, Bld: 156 mg/dL — ABNORMAL HIGH (ref 70–99)
Potassium: 3.8 mEq/L (ref 3.5–5.1)
Sodium: 136 mEq/L (ref 135–145)
Total Bilirubin: 0.3 mg/dL (ref 0.3–1.2)
Total Protein: 5.6 g/dL — ABNORMAL LOW (ref 6.0–8.3)

## 2012-09-19 LAB — CBC
HCT: 23.8 % — ABNORMAL LOW (ref 36.0–46.0)
Hemoglobin: 8.2 g/dL — ABNORMAL LOW (ref 12.0–15.0)
MCH: 32 pg (ref 26.0–34.0)
MCHC: 34.5 g/dL (ref 30.0–36.0)
MCV: 93 fL (ref 78.0–100.0)
Platelets: 235 10*3/uL (ref 150–400)
RBC: 2.56 MIL/uL — ABNORMAL LOW (ref 3.87–5.11)
RDW: 15.7 % — ABNORMAL HIGH (ref 11.5–15.5)
WBC: 4.3 10*3/uL (ref 4.0–10.5)

## 2012-09-19 LAB — HEMOGLOBIN A1C
Hgb A1c MFr Bld: 5.2 % (ref <5.7)
Mean Plasma Glucose: 103 mg/dL (ref <117)

## 2012-09-19 LAB — ABO/RH: ABO/RH(D): A POS

## 2012-09-19 LAB — PREPARE RBC (CROSSMATCH)

## 2012-09-19 LAB — MAGNESIUM: Magnesium: 2 mg/dL (ref 1.5–2.5)

## 2012-09-19 MED ORDER — SODIUM CHLORIDE 0.9 % IV SOLN
12.0000 mg | Freq: Once | INTRAVENOUS | Status: AC
  Administered 2012-09-19: 12 mg via INTRAVENOUS
  Filled 2012-09-19: qty 1.2

## 2012-09-19 MED ORDER — PREDNISOLONE ACETATE 1 % OP SUSP
1.0000 [drp] | Freq: Three times a day (TID) | OPHTHALMIC | Status: DC
  Administered 2012-09-19 – 2012-09-20 (×3): 1 [drp] via OPHTHALMIC
  Filled 2012-09-19 (×2): qty 1

## 2012-09-19 MED ORDER — SODIUM CHLORIDE 0.9 % IV SOLN
INTRAVENOUS | Status: DC
  Administered 2012-09-19: 11:00:00 via INTRAVENOUS

## 2012-09-19 MED ORDER — SODIUM CHLORIDE 0.9 % IJ SOLN
10.0000 mL | INTRAMUSCULAR | Status: DC | PRN
  Filled 2012-09-19: qty 10

## 2012-09-19 MED ORDER — DEXAMETHASONE SODIUM PHOSPHATE 4 MG/ML IJ SOLN: 12.0000 mg | Freq: Once | INTRAMUSCULAR | Status: DC

## 2012-09-19 MED ORDER — HEPARIN SOD (PORK) LOCK FLUSH 100 UNIT/ML IV SOLN: 500.0000 [IU] | Freq: Every day | INTRAVENOUS | Status: DC | PRN

## 2012-09-19 MED ORDER — SODIUM CHLORIDE 0.9 % IJ SOLN: 10.0000 mL | INTRAMUSCULAR | Status: DC | PRN

## 2012-09-19 MED ORDER — DIPHENHYDRAMINE HCL 25 MG PO CAPS
25.0000 mg | ORAL_CAPSULE | Freq: Once | ORAL | Status: AC
  Administered 2012-09-19: 25 mg via ORAL
  Filled 2012-09-19: qty 1

## 2012-09-19 MED ORDER — PENTAMIDINE ISETHIONATE 300 MG IN SOLR: 300.0000 mg | Freq: Once | RESPIRATORY_TRACT | Status: DC

## 2012-09-19 MED ORDER — ACYCLOVIR 200 MG PO CAPS
400.0000 mg | ORAL_CAPSULE | Freq: Two times a day (BID) | ORAL | Status: DC
  Administered 2012-09-19 – 2012-09-20 (×3): 400 mg via ORAL
  Filled 2012-09-19 (×2): qty 2

## 2012-09-19 MED ORDER — PREDNISONE 50 MG PO TABS
80.0000 mg | ORAL_TABLET | Freq: Every day | ORAL | Status: DC
  Filled 2012-09-19: qty 1

## 2012-09-19 MED ORDER — SODIUM CHLORIDE 0.9 % IV SOLN
250.0000 mL | Freq: Once | INTRAVENOUS | Status: AC
  Administered 2012-09-19: 250 mL via INTRAVENOUS

## 2012-09-19 MED ORDER — ACETAMINOPHEN 325 MG PO TABS
650.0000 mg | ORAL_TABLET | Freq: Once | ORAL | Status: AC
  Administered 2012-09-19: 650 mg via ORAL
  Filled 2012-09-19: qty 2

## 2012-09-19 MED ORDER — PREDNISOLONE ACETATE 1 % OP SUSP: 1.0000 [drp] | Freq: Three times a day (TID) | OPHTHALMIC | Status: DC

## 2012-09-19 MED ORDER — SODIUM CHLORIDE 0.9 % IV SOLN
200.0000 mg/m2 | Freq: Once | INTRAVENOUS | Status: AC
  Administered 2012-09-19: 420 mg via INTRAVENOUS
  Filled 2012-09-19: qty 21

## 2012-09-19 MED ORDER — HEPARIN SOD (PORK) LOCK FLUSH 100 UNIT/ML IV SOLN: 250.0000 [IU] | INTRAVENOUS | Status: DC | PRN

## 2012-09-19 MED ORDER — SODIUM CHLORIDE 0.9 % IJ SOLN: 3.0000 mL | INTRAMUSCULAR | Status: DC | PRN

## 2012-09-19 MED ORDER — FUROSEMIDE 10 MG/ML IJ SOLN: 20.0000 mg | Freq: Once | INTRAMUSCULAR | Status: DC

## 2012-09-19 NOTE — Progress Notes (Signed)
     Subjective: This lady was admitted semi-electively by oncology for intravenous hydration as she is having chemotherapy for lymphoma. She has done well overnight with no specific complaints. She is due to have chemotherapy this morning.           Physical Exam: Blood pressure 102/66, pulse 91, temperature 98 F (36.7 C), temperature source Oral, resp. rate 18, height 5\' 6"  (1.676 m), weight 91.8 kg (202 lb 6.1 oz), SpO2 98.00%. She looks chronically sick but is not acutely unwell at this point in time. Heart sounds are present and normal. Lung fields are clear. She is alert and oriented. She looks clinically well hydrated.   Investigations:  No results found for this or any previous visit (from the past 240 hour(s)).   Basic Metabolic Panel:  Recent Labs  16/10/96 0930 09/19/12 0451  NA 136 136  K 3.5 3.8  CL 101 104  CO2 24 21  GLUCOSE 144* 156*  BUN 16 11  CREATININE 0.67 0.60  CALCIUM 9.5 8.7  MG  --  2.0   Liver Function Tests:  Recent Labs  09/18/12 0930 09/19/12 0451  AST 20 19  ALT 22 19  ALKPHOS 59 45  BILITOT 0.3 0.3  PROT 6.8 5.6*  ALBUMIN 3.6 2.9*     CBC:  Recent Labs  09/18/12 0930 09/19/12 0451  WBC 6.5 4.3  NEUTROABS 6.2  --   HGB 10.3* 8.2*  HCT 29.6* 23.8*  MCV 93.1 93.0  PLT 281 235    No results found.    Medications: I have reviewed the patient's current medications.  Impression: 1. Hodgkin's lymphoma which has been resistant to previous treatment. Now high-dose chemotherapy. 2. Hypertension, controlled. 3. 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     Plan: 1. Continue supportive measures with intravenous fluids. 2. Moderate hemoglobin. 3. Should be able to be discharged early in the morning tomorrow.     LOS: 1 day   Wilson Singer Pager [phone]  09/19/2012, 8:35 AM

## 2012-09-19 NOTE — Progress Notes (Signed)
Tolerated infusion well. Type and cross sent for transfusion to be done this evening.

## 2012-09-19 NOTE — Progress Notes (Signed)
UR Chart Review Completed  

## 2012-09-19 NOTE — Progress Notes (Signed)
[name], MD [address] [place] [address]  Hodgkin's lymphoma  CURRENT THERAPY: Now on cycle 2 of DA BEACOPP starting on 09/18/2012   INTERVAL HISTORY: [name] [name] 42 y.o. female returns for  regular  visit for followup of stage II A. bulky Hodgkin's lymphoma, nodular sclerosing type, presenting with a large anterior mediastinal mass and left cervical and right cervical lymph nodes status post 5 cycles of ABVD but with incomplete response both after cycles 3 and cycles 5. We therefore switched her to dose adjusted BEACOPP starting on 08/28/2012.  Her hospitalization is going well thus far she report.  There was a mishap with orders being placed for her hospitalization but she was not bothered by that.  I think there was a lack of communication on our part with the Hospitalist group but I am not sure of the details.  She reports that her eyes are burning from dry eyes.  She is on "sustain" for this but it is only providing temporary relief.  I personally reviewed and went over laboratory results with the patient.  We discussed her Hgb which is 8.2 g/dL.  We discussed treatment options including blood transfusion and Procrit injections.  We discussed the pros and cons of both treatment options.  We discussed the risks, benefits, alternatives, and side effects of both.    She agrees that a PRBC transfusion is her best option as this will help with her fatigue symptomatology quicker particularly since she is near the completion of chemotherapy. This can be delivered while she is in the hospital.  I will pre-medicate her with Benadryl and Tylenol since she has never had a PRBC transfusion.     Past Medical History  Diagnosis [name] [name]  . Fibromyalgia   . Eczema   . Depression     parents died close together "was hard"  . Restless leg   . Heart murmur     Told 'nothing to worry about"  No 2D Echo  . Sleep apnea     Sleep Study- 1996- "little apnea- no tx"  . Headache     Birth  control pills help  . Hemorrhoid   . Anemia     As a child and while pregnant  . 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 Hypertension     recently dx with hodgkins  . Dyspnea     all the time .".due to the mass"    has Fibromyalgia; Eczema; Hypertension; Dyspnea; Depression; Restless leg; Mediastinal mass; and Hodgkin's lymphoma on her problem list.     is allergic to sulfur.  [name] [name] does not currently have medications on file.  Past Surgical History  Procedure Laterality [name] [name]  . Wisdom tooth extraction    . Dilation and curettage of uterus  1991  . Mediastinal mass biopsy  03/05/12  . Bone marrow biopsy  03/20/2012  . Portacath placement  03/26/2012    Procedure: INSERTION PORT-A-CATH;  Surgeon: Loreli Slot, MD;  Location: [facility] OR;  Service: Thoracic;  Laterality: N/A;    Denies any headaches, dizziness, double vision, fevers, chills, night sweats, nausea, vomiting, diarrhea, constipation, chest pain, heart palpitations, shortness of breath, blood in stool, black tarry stool, urinary pain, urinary burning, urinary frequency, hematuria.   PHYSICAL EXAMINATION  ECOG PERFORMANCE STATUS: 1 - Symptomatic but completely ambulatory  There were no vitals filed for this visit.  GENERAL:alert, no distress, well nourished, well developed, comfortable, cooperative, smiling and alopecia  and walking the floor SKIN: skin color, texture, turgor are normal, no rashes or significant lesions HEAD: Normocephalic, No masses, lesions, tenderness or abnormalities EYES: normal, Conjunctiva are pink and non-injected EARS: External ears normal NECK: supple, trachea midline LYMPH:  not examined BREAST:not examined NEURO: alert & oriented x 3 with fluent speech, no focal motor/sensory deficits, gait normal    LABORATORY DATA: CBC    Component Value [name] [name]/Time   WBC 4.3 09/19/2012 0451   RBC 2.56* 09/19/2012 0451   HGB 8.2* 09/19/2012 0451   HCT 23.8*  09/19/2012 0451   PLT 235 09/19/2012 0451   MCV 93.0 09/19/2012 0451   MCH 32.0 09/19/2012 0451   MCHC 34.5 09/19/2012 0451   RDW 15.7* 09/19/2012 0451   LYMPHSABS 0.2* 09/18/2012 0930   MONOABS 0.1 09/18/2012 0930   EOSABS 0.0 09/18/2012 0930   BASOSABS 0.0 09/18/2012 0930      Chemistry      Component Value [name] [name]/Time   NA 136 09/19/2012 0451   K 3.8 09/19/2012 0451   CL 104 09/19/2012 0451   CO2 21 09/19/2012 0451   BUN 11 09/19/2012 0451   CREATININE 0.60 09/19/2012 0451      Component Value [name] [name]/Time   CALCIUM 8.7 09/19/2012 0451   ALKPHOS 45 09/19/2012 0451   AST 19 09/19/2012 0451   ALT 19 09/19/2012 0451   BILITOT 0.3 09/19/2012 0451     Results for [name] [name], [name] [name] (MRN [id number]) as of 09/19/2012 10:17  Ref. Range 09/18/2012 09:30  LDH Latest Range: 94-250 U/L 233   Results for [name] [name], [name] [name] (MRN [id number]) as of 09/19/2012 10:17  Ref. Range 09/18/2012 09:30  Sed Rate Latest Range: 0-22 mm/hr 22      ASSESSMENT:  1. Stage II A. bulky Hodgkin's lymphoma, nodular sclerosing type, presenting with a large anterior mediastinal mass and left cervical and right cervical lymph nodes status post 5 cycles of ABVD but with incomplete response both after cycles 3 and cycles 5. We therefore switched her to dose adjusted BEACOPP starting on 08/28/2012. Started cycle 2 of DA BEACOPP on 09/18/2012. 2. Fatigue/Tiredness 3. Dry eyes, taking sustain.    PLAN:  1. PET scan is scheduled for 4/29 for restaging. 2. Pentamidine is scheduled for 4/18 which is D10 of Cycle 2. 3. Pentamidine is signed and held for 10/25/2012 4. Continue with chemotherapy regimen as scheduled 5. Order set for 2 unit PRBC transfusion signed.  6. Continue with Sustain for dry eyes 7. Will add Predforte 1% TID with continuation of Sustain eye drops for 2-4 weeks to her inpatient orders.  I will call in the prescription as well for outpatient use.  8. Return in 4/29 following PET scan for discussion regarding PET scan results  and future plan.   All questions were answered. The patient knows to call the clinic with any problems, questions or concerns. We can certainly see the patient much sooner if necessary.  Patient and plan will be discussed with Dr. Mariel Sleet within the next 24 hours.    KEFALAS,THOMAS

## 2012-09-19 NOTE — Care Management Note (Signed)
    Page 1 of 1   09/19/2012     12:51:33 PM   CARE MANAGEMENT NOTE 09/19/2012  Patient:  [name] [name], [name] [name]   Account Number:  [id number]  Date Initiated:  09/19/2012  Documentation initiated by:  Rosemary Holms  Subjective/Objective Assessment:   Pt admitted from home with spouse. Pt getting chemo and hydration overnight and then chemo again tomorrow at 8:30. Pt will be DC to Chemo.     Action/Plan:   Anticipated DC Date:  09/20/2012   Anticipated DC Plan:  HOME/SELF CARE      DC Planning Services  CM consult      Choice offered to / List presented to:             Status of service:  Completed, signed off Medicare Important Message given?   (If response is "NO", the following Medicare IM given date fields will be blank) Date Medicare IM given:   Date Additional Medicare IM given:    Discharge Disposition:  HOME/SELF CARE  Per UR Regulation:    If discussed at Long Length of Stay Meetings, dates discussed:    Comments:  09/19/12 Rosemary Holms RN BSN CM

## 2012-09-20 ENCOUNTER — Inpatient Hospital Stay (HOSPITAL_BASED_OUTPATIENT_CLINIC_OR_DEPARTMENT_OTHER): Payer: BC Managed Care – PPO

## 2012-09-20 VITALS — BP 147/93 | HR 80 | Temp 98.3°F | Resp 18

## 2012-09-20 DIAGNOSIS — Z5111 Encounter for antineoplastic chemotherapy: Secondary | ICD-10-CM

## 2012-09-20 DIAGNOSIS — C8119 Nodular sclerosis classical Hodgkin lymphoma, extranodal and solid organ sites: Secondary | ICD-10-CM

## 2012-09-20 DIAGNOSIS — C8190 Hodgkin lymphoma, unspecified, unspecified site: Secondary | ICD-10-CM

## 2012-09-20 LAB — BASIC METABOLIC PANEL
BUN: 12 mg/dL (ref 6–23)
CO2: 23 mEq/L (ref 19–32)
Calcium: 8.9 mg/dL (ref 8.4–10.5)
Chloride: 107 mEq/L (ref 96–112)
Creatinine, Ser: 0.68 mg/dL (ref 0.50–1.10)
GFR calc Af Amer: >90 mL/min (ref >90)
GFR calc non Af Amer: >90 mL/min (ref >90)
Glucose, Bld: 117 mg/dL — ABNORMAL HIGH (ref 70–99)
Potassium: 3.9 mEq/L (ref 3.5–5.1)
Sodium: 140 mEq/L (ref 135–145)

## 2012-09-20 LAB — CBC
HCT: 29.5 % — ABNORMAL LOW (ref 36.0–46.0)
Hemoglobin: 10.3 g/dL — ABNORMAL LOW (ref 12.0–15.0)
MCH: 31.9 pg (ref 26.0–34.0)
MCHC: 34.9 g/dL (ref 30.0–36.0)
MCV: 91.3 fL (ref 78.0–100.0)
Platelets: 213 10*3/uL (ref 150–400)
RBC: 3.23 MIL/uL — ABNORMAL LOW (ref 3.87–5.11)
RDW: 16.2 % — ABNORMAL HIGH (ref 11.5–15.5)
WBC: 3.5 10*3/uL — ABNORMAL LOW (ref 4.0–10.5)

## 2012-09-20 LAB — TYPE AND SCREEN
ABO/RH(D): A POS
Antibody Screen: NEGATIVE
Unit division: 0
Unit division: 0

## 2012-09-20 MED ORDER — DEXAMETHASONE SODIUM PHOSPHATE 4 MG/ML IJ SOLN: 12.0000 mg | Freq: Once | INTRAMUSCULAR | Status: DC

## 2012-09-20 MED ORDER — SODIUM CHLORIDE 0.9 % IV SOLN
INTRAVENOUS | Status: DC
  Administered 2012-09-20: 11:00:00 via INTRAVENOUS

## 2012-09-20 MED ORDER — SODIUM CHLORIDE 0.9 % IV SOLN
12.0000 mg | Freq: Once | INTRAVENOUS | Status: AC
  Administered 2012-09-20: 12 mg via INTRAVENOUS
  Filled 2012-09-20: qty 1.2

## 2012-09-20 MED ORDER — SODIUM CHLORIDE 0.9 % IV SOLN
200.0000 mg/m2 | Freq: Once | INTRAVENOUS | Status: AC
  Administered 2012-09-20: 420 mg via INTRAVENOUS
  Filled 2012-09-20: qty 21

## 2012-09-20 MED ORDER — HEPARIN SOD (PORK) LOCK FLUSH 100 UNIT/ML IV SOLN
INTRAVENOUS | Status: AC
  Filled 2012-09-20: qty 5

## 2012-09-20 MED ORDER — HEPARIN SOD (PORK) LOCK FLUSH 100 UNIT/ML IV SOLN
500.0000 [IU] | Freq: Once | INTRAVENOUS | Status: AC | PRN
  Administered 2012-09-20: 500 [IU]
  Filled 2012-09-20: qty 5

## 2012-09-20 NOTE — Discharge Summary (Signed)
Physician Discharge Summary  Patient ID: [name] [name] MRN: [id number] DOB/AGE: 43-11-1970 43 y.o.  Admit date: 09/18/2012 Discharge date: 09/20/2012  Discharge Diagnoses:  Principal Problem:   Hodgkin's lymphoma Active Problems:   Fibromyalgia   Hypertension   Depression     Medication List    TAKE these medications       acetaminophen 500 MG tablet  Commonly known as:  TYLENOL  Take 1,000 mg by mouth every 6 (six) hours as needed for pain.     acyclovir 400 MG tablet  Commonly known as:  ZOVIRAX  Take 400 mg by mouth 2 (two) times daily. Take throughout chemo.     allopurinol 300 MG tablet  Commonly known as:  ZYLOPRIM  Take 1 tablet (300 mg total) by mouth daily. # 30 2 refills only (3 month supply total)     ALPRAZolam 1 MG tablet  Commonly known as:  XANAX  Take 1/2 tablet to 1 tablet four times daily as needed for anxiety.     chlorpheniramine-HYDROcodone 10-8 MG/5ML Lqcr  Commonly known as:  TUSSIONEX PENNKINETIC ER  Take 5 mLs by mouth every 12 (twelve) hours as needed.     cholecalciferol 1000 UNITS tablet  Commonly known as:  VITAMIN D  Take 1,000 Units by mouth daily.     fish oil-omega-3 fatty acids 1000 MG capsule  Take 1 g by mouth daily.     gabapentin 600 MG tablet  Commonly known as:  NEURONTIN  Take 600 mg by mouth at bedtime.     lidocaine-prilocaine cream  Commonly known as:  EMLA  Apply topically as needed. Apply a quarter sized amount to port site 1 hour prior to chemo. Do not rub in. Cover with plastic.     lisinopril-hydrochlorothiazide 20-12.5 MG per tablet  Commonly known as:  PRINZIDE,ZESTORETIC  Take 1 tablet by mouth daily.     LORazepam 1 MG tablet  Commonly known as:  ATIVAN  Take 1 tablet by mouth at bedtime as needed. For nausea     methocarbamol 500 MG tablet  Commonly known as:  ROBAXIN  Take 1 tablet (500 mg total) by mouth 2 (two) times daily. Refill given on 08/30/12 per Dr. Mariel Sleet.     norethindrone-ethinyl  estradiol 1-20 MG-MCG tablet  Commonly known as:  JUNEL FE,GILDESS FE,LOESTRIN FE  Take 1 tablet by mouth daily.     ondansetron 8 MG tablet  Commonly known as:  ZOFRAN  Take 8 mg by mouth 3 (three) times daily as needed for nausea. Do not take until the 3rd day after chemotherapy due to the premeds (aloxi/emend) that we give you.     potassium chloride 10 MEQ tablet  Commonly known as:  K-DUR,KLOR-CON  Take 10 mEq by mouth daily.     prednisoLONE acetate 1 % ophthalmic suspension  Commonly known as:  PRED FORTE  Place 1 drop into both eyes 3 (three) times daily.     predniSONE 20 MG tablet  Commonly known as:  DELTASONE  Take 80 mg by mouth daily. Days 1-14, then off 7 days.  #48 1 refill     procarbazine 50 MG capsule  Commonly known as:  MATULANE  Take 200 mg by mouth daily. Follow low tyramine diet., For seven days then off 14 days and then another seven day cycle.     ranitidine 150 MG capsule  Commonly known as:  ZANTAC  Take 150 mg by mouth 2 (two) times daily.  senna 8.6 MG Tabs  Commonly known as:  SENOKOT  Take 1 tablet by mouth at bedtime.     temazepam 15 MG capsule  Commonly known as:  RESTORIL  Take 1 capsule (15 mg total) by mouth at bedtime as needed for sleep.     tiZANidine 4 MG capsule  Commonly known as:  ZANAFLEX  Take 4-6 mg by mouth at bedtime. Takes 4-6 mg dose depends on severity of spasms.            Discharge Orders   Future Appointments Provider Department Dept Phone   09/20/2012 8:45 AM [facility] Team A [facility] [phone]   09/25/2012 9:15 AM [facility] Team B [facility] [phone]   09/26/2012 4:00 PM [facility] Chair 7 [facility] [phone]   09/27/2012 1:00 PM [facility] Chair 7 [facility] [phone]   09/28/2012 11:00 AM [facility] Chair 7 [facility]ENTER [phone]   09/29/2012 11:00 AM [facility] Chair 7 [facility]ENTER [phone]   09/30/2012 4:30 PM  Ap[facility]capa Chair 7 [facility] CANCER CENTER [phone]   10/01/2012 4:30 PM Ap[facility] Chair 7 [facility] CENTER [phone]   10/08/2012 8:00 AM [facility] [phone]   Pt should arrive15 minutes prior to scheduled appt time. Please inform patient that exam will take a minimum of 1 1/2 hours. Patient to be NPO 6 hours prior to exam  and should not take any insulin the day of exam.   10/08/2012 2:00 PM Randall An, MD [facility] CANCER CENTER [phone]   10/10/2012 8:30 AM [facility] [phone]   Future Orders Complete By Expires     Activity as tolerated - No restrictions  As directed     Diet general  As directed        Follow-up Information   Follow up with cancer center. (today)       Disposition: Discharge to cancer Center  Discharged Condition: Stable  Consults: Treatment Team:  Randall An, MD  Labs:   Results for orders placed during the hospital encounter of 09/18/12 (from the past 48 hour(s))  HEMOGLOBIN A1C     Status: None   Collection Time    09/18/12  7:56 PM      Result Value Range   Hemoglobin A1C 5.2  <5.7 %   Comment: (NOTE)                                                                               According to the ADA Clinical Practice Recommendations for 2011, when     HbA1c is used as a screening test:      >=6.5%   Diagnostic of Diabetes Mellitus               (if abnormal result is confirmed)     5.7-6.4%   Increased risk of developing Diabetes Mellitus     References:Diagnosis and Classification of Diabetes Mellitus,Diabetes     Care,2011,34(Suppl 1):S62-S69 and Standards of Medical Care in             Diabetes - 2011,Diabetes HYWV,3710,62 (Suppl  1):S11-S61.   Mean Plasma Glucose 103  <117 mg/dL  URINALYSIS, ROUTINE W REFLEX MICROSCOPIC     Status: Abnormal   Collection Time    09/18/12 10:00 PM      Result Value Range   Color,  Urine YELLOW  YELLOW   APPearance CLEAR  CLEAR   Specific Gravity, Urine >1.030 (*) 1.005 - 1.030   pH 5.5  5.0 - 8.0   Glucose, UA 100 (*) NEGATIVE mg/dL   Hgb urine dipstick NEGATIVE  NEGATIVE   Bilirubin Urine NEGATIVE  NEGATIVE   Ketones, ur NEGATIVE  NEGATIVE mg/dL   Protein, ur NEGATIVE  NEGATIVE mg/dL   Urobilinogen, UA 0.2  0.0 - 1.0 mg/dL   Nitrite NEGATIVE  NEGATIVE   Leukocytes, UA NEGATIVE  NEGATIVE   Comment: MICROSCOPIC NOT DONE ON URINES WITH NEGATIVE PROTEIN, BLOOD, LEUKOCYTES, NITRITE, OR GLUCOSE <1000 mg/dL.  MAGNESIUM     Status: None   Collection Time    09/19/12  4:51 AM      Result Value Range   Magnesium 2.0  1.5 - 2.5 mg/dL  CBC     Status: Abnormal   Collection Time    09/19/12  4:51 AM      Result Value Range   WBC 4.3  4.0 - 10.5 K/uL   RBC 2.56 (*) 3.87 - 5.11 MIL/uL   Hemoglobin 8.2 (*) 12.0 - 15.0 g/dL   Comment: DELTA CHECK NOTED     RESULT REPEATED AND VERIFIED   HCT 23.8 (*) 36.0 - 46.0 %   MCV 93.0  78.0 - 100.0 fL   MCH 32.0  26.0 - 34.0 pg   MCHC 34.5  30.0 - 36.0 g/dL   RDW 47.8 (*) 29.5 - 62.1 %   Platelets 235  150 - 400 K/uL  COMPREHENSIVE METABOLIC PANEL     Status: Abnormal   Collection Time    09/19/12  4:51 AM      Result Value Range   Sodium 136  135 - 145 mEq/[name]   Potassium 3.8  3.5 - 5.1 mEq/[name]   Chloride 104  96 - 112 mEq/[name]   CO2 21  19 - 32 mEq/[name]   Glucose, Bld 156 (*) 70 - 99 mg/dL   BUN 11  6 - 23 mg/dL   Creatinine, Ser 3.08  0.50 - 1.10 mg/dL   Calcium 8.7  8.4 - 65.7 mg/dL   Total Protein 5.6 (*) 6.0 - 8.3 g/dL   Albumin 2.9 (*) 3.5 - 5.2 g/dL   AST 19  0 - 37 U/[name]   ALT 19  0 - 35 U/[name]   Alkaline Phosphatase 45  39 - 117 U/[name]   Total Bilirubin 0.3  0.3 - 1.2 mg/dL   GFR calc non Af Amer >90  >90 mL/min   GFR calc Af Amer >90  >90 mL/min   Comment:            The eGFR has been calculated     using the CKD EPI equation.     This calculation has not been     validated in all clinical     situations.     eGFR's  persistently     <90 mL/min signify     possible Chronic Kidney Disease.  PREPARE RBC (CROSSMATCH)     Status: None   Collection Time    09/19/12  1:20 PM      Result Value Range   Order Confirmation ORDER PROCESSED BY BLOOD  BANK    BASIC METABOLIC PANEL     Status: Abnormal   Collection Time    09/20/12  5:29 AM      Result Value Range   Sodium 140  135 - 145 mEq/[name]   Potassium 3.9  3.5 - 5.1 mEq/[name]   Chloride 107  96 - 112 mEq/[name]   CO2 23  19 - 32 mEq/[name]   Glucose, Bld 117 (*) 70 - 99 mg/dL   BUN 12  6 - 23 mg/dL   Creatinine, Ser 1.61  0.50 - 1.10 mg/dL   Calcium 8.9  8.4 - 09.6 mg/dL   GFR calc non Af Amer >90  >90 mL/min   GFR calc Af Amer >90  >90 mL/min   Comment:            The eGFR has been calculated     using the CKD EPI equation.     This calculation has not been     validated in all clinical     situations.     eGFR's persistently     <90 mL/min signify     possible Chronic Kidney Disease.  CBC     Status: Abnormal   Collection Time    09/20/12  5:29 AM      Result Value Range   WBC 3.5 (*) 4.0 - 10.5 K/uL   RBC 3.23 (*) 3.87 - 5.11 MIL/uL   Hemoglobin 10.3 (*) 12.0 - 15.0 g/dL   Comment: DELTA CHECK NOTED   HCT 29.5 (*) 36.0 - 46.0 %   MCV 91.3  78.0 - 100.0 fL   MCH 31.9  26.0 - 34.0 pg   MCHC 34.9  30.0 - 36.0 g/dL   RDW 04.5 (*) 40.9 - 81.1 %   Platelets 213  150 - 400 K/uL    Diagnostics:  Dg Fluoro Rm 1-60 Min  08/21/2012  *RADIOLOGY REPORT*  Clinical Data: Poor return for Port-A-Cath.  Difficulty administering medications.  FLOURO RM 1-60 MIN  Technique: Spot and cine images were obtained of the catheter and tip during injection of approximately 40 ml of Omnipaque 300  Comparison:  P E T of 08/16/2012  Findings: Left-sided Port-A-Cath terminates at the high SVC.  It is directed laterally, with it is tip likely against the lateral wall. With contrast injection, there is mild pooling about the catheter tip, especially medially.  No evidence of contrast  leak or catheter discontinuity.  IMPRESSION:  1.  Catheter positioned at the high SVC, with tip directed laterally towards the lateral wall. 2.  Mild contrast pooling at the catheter tip. Likely secondary to catheter position against the lateral wall and concurrent fibrin sheath.   Original Report Authenticated By: Jeronimo Greaves, M.D.    Full Code   Hospital Course: See H&P for complete admission details. The patient is a 42 year old white female with stage IIA Hodgkin's lymphoma. She was started on chemotherapy in the cancer Center. She required admission for hydration. She also had blood transfusion for symptomatic anemia. She tolerated both well and will be discharged back to the cancer Center for chemotherapy.  Discharge Exam:  Blood pressure 136/84, pulse 71, temperature 98.2 F (36.8 C), temperature source Oral, resp. rate 20, height 5\' 6"  (1.676 m), weight 93 kg (205 lb 0.4 oz), SpO2 100.00%.  General: Alert. Oriented. Comfortable Lungs clear to auscultation bilaterally without wheeze rhonchi or rales Extremities no clubbing cyanosis or edema  Signed: Shardea Cwynar [name] 09/20/2012, 7:54 AM

## 2012-09-20 NOTE — Progress Notes (Signed)
Tolerated chemo well. 

## 2012-09-20 NOTE — Progress Notes (Signed)
Pt discharged with instructions, prescriptions, and care notes.  Pt stated that she had no further complaints or concerns at this time.  The patient was escorted to the Cancer Center here at [facility].  I left her port accessed per her request since she was going to have her chemo this morning.

## 2012-09-25 ENCOUNTER — Other Ambulatory Visit (HOSPITAL_COMMUNITY): Payer: Self-pay | Admitting: *Deleted

## 2012-09-25 ENCOUNTER — Encounter (HOSPITAL_BASED_OUTPATIENT_CLINIC_OR_DEPARTMENT_OTHER): Payer: BC Managed Care – PPO

## 2012-09-25 VITALS — BP 160/101 | HR 97 | Temp 97.6°F | Resp 18 | Wt 201.2 lb

## 2012-09-25 DIAGNOSIS — C8190 Hodgkin lymphoma, unspecified, unspecified site: Secondary | ICD-10-CM

## 2012-09-25 DIAGNOSIS — C8119 Nodular sclerosis classical Hodgkin lymphoma, extranodal and solid organ sites: Secondary | ICD-10-CM

## 2012-09-25 DIAGNOSIS — D709 Neutropenia, unspecified: Secondary | ICD-10-CM

## 2012-09-25 DIAGNOSIS — Z5111 Encounter for antineoplastic chemotherapy: Secondary | ICD-10-CM

## 2012-09-25 LAB — COMPREHENSIVE METABOLIC PANEL
ALT: 19 U/L (ref 0–35)
AST: 13 U/L (ref 0–37)
Albumin: 3.4 g/dL — ABNORMAL LOW (ref 3.5–5.2)
Alkaline Phosphatase: 42 U/L (ref 39–117)
BUN: 26 mg/dL — ABNORMAL HIGH (ref 6–23)
CO2: 25 mEq/L (ref 19–32)
Calcium: 9.1 mg/dL (ref 8.4–10.5)
Chloride: 98 mEq/L (ref 96–112)
Creatinine, Ser: 0.69 mg/dL (ref 0.50–1.10)
GFR calc Af Amer: >90 mL/min (ref >90)
GFR calc non Af Amer: >90 mL/min (ref >90)
Glucose, Bld: 156 mg/dL — ABNORMAL HIGH (ref 70–99)
Potassium: 3.8 mEq/L (ref 3.5–5.1)
Sodium: 135 mEq/L (ref 135–145)
Total Bilirubin: 0.4 mg/dL (ref 0.3–1.2)
Total Protein: 6.3 g/dL (ref 6.0–8.3)

## 2012-09-25 LAB — FERRITIN: Ferritin: 558 ng/mL — ABNORMAL HIGH (ref 10–291)

## 2012-09-25 LAB — IRON AND TIBC
Iron: 301 ug/dL — ABNORMAL HIGH (ref 42–135)
UIBC: <15 ug/dL — ABNORMAL LOW (ref 125–400)

## 2012-09-25 LAB — CBC WITH DIFFERENTIAL/PLATELET
Basophils Absolute: 0 10*3/uL (ref 0.0–0.1)
Basophils Relative: 3 % — ABNORMAL HIGH (ref 0–1)
Eosinophils Absolute: 0 10*3/uL (ref 0.0–0.7)
Eosinophils Relative: 0 % (ref 0–5)
HCT: 31.2 % — ABNORMAL LOW (ref 36.0–46.0)
Hemoglobin: 10.9 g/dL — ABNORMAL LOW (ref 12.0–15.0)
Lymphocytes Relative: 15 % (ref 12–46)
Lymphs Abs: 0.1 10*3/uL — ABNORMAL LOW (ref 0.7–4.0)
MCH: 31.5 pg (ref 26.0–34.0)
MCHC: 34.9 g/dL (ref 30.0–36.0)
MCV: 90.2 fL (ref 78.0–100.0)
Monocytes Absolute: 0 10*3/uL — ABNORMAL LOW (ref 0.1–1.0)
Monocytes Relative: 0 % — ABNORMAL LOW (ref 3–12)
Neutro Abs: 0.3 10*3/uL — ABNORMAL LOW (ref 1.7–7.7)
Neutrophils Relative %: 82 % — ABNORMAL HIGH (ref 43–77)
Platelets: 107 10*3/uL — ABNORMAL LOW (ref 150–400)
RBC: 3.46 MIL/uL — ABNORMAL LOW (ref 3.87–5.11)
RDW: 13.9 % (ref 11.5–15.5)
WBC: 0.3 10*3/uL — CL (ref 4.0–10.5)

## 2012-09-25 MED ORDER — VINCRISTINE SULFATE CHEMO INJECTION 1 MG/ML
2.0000 mg | Freq: Once | INTRAVENOUS | Status: AC
  Administered 2012-09-25: 2 mg via INTRAVENOUS
  Filled 2012-09-25: qty 2

## 2012-09-25 MED ORDER — HEPARIN SOD (PORK) LOCK FLUSH 100 UNIT/ML IV SOLN
500.0000 [IU] | Freq: Once | INTRAVENOUS | Status: AC
  Administered 2012-09-25: 500 [IU] via INTRAVENOUS
  Filled 2012-09-25: qty 5

## 2012-09-25 MED ORDER — SODIUM CHLORIDE 0.9 % IV SOLN
INTRAVENOUS | Status: DC
  Administered 2012-09-25: 11:00:00 via INTRAVENOUS

## 2012-09-25 MED ORDER — FILGRASTIM 480 MCG/1.6ML IJ SOLN
480.0000 ug | Freq: Once | INTRAMUSCULAR | Status: AC
  Administered 2012-09-25: 480 ug via SUBCUTANEOUS
  Filled 2012-09-25: qty 1.6

## 2012-09-25 MED ORDER — SODIUM CHLORIDE 0.9 % IV SOLN
Freq: Once | INTRAVENOUS | Status: AC
  Administered 2012-09-25: 8 mg via INTRAVENOUS
  Filled 2012-09-25: qty 4

## 2012-09-25 MED ORDER — SODIUM CHLORIDE 0.9 % IV SOLN
10.0000 [IU]/m2 | Freq: Once | INTRAVENOUS | Status: AC
  Administered 2012-09-25: 21 [IU] via INTRAVENOUS
  Filled 2012-09-25: qty 7

## 2012-09-25 MED ORDER — HEPARIN SOD (PORK) LOCK FLUSH 100 UNIT/ML IV SOLN
INTRAVENOUS | Status: AC
  Filled 2012-09-25: qty 5

## 2012-09-25 NOTE — Progress Notes (Signed)
CRITICAL VALUE ALERT Critical value received:  WBC 0.3 Date of notification:  09/25/2012  Time of notification: 1005 Critical value read back:  yes Nurse who received alert:  Payton Doughty, RN MD notified (1st page):  Dr. Mariel Sleet   09/25/2012 1015  Per Dr. Mariel Sleet, patient is to be re-educated on neutropenic precautions and in the event of a temperature of 100.5C or higher needs to contact the CC or report to ER if the clinic is closed.  Neupogen being administered today.  Continue w/ therapy for today as planned.  Acyclovir and Pentamidine as ordered.  Per Neysa Bonito, RRT, patient can receive Pentamidine as ordered tomorrow around 4pm when she comes in for her Neupogen injection.  Angelina Sheriff, RN informed of plan of care moving forward.

## 2012-09-25 NOTE — Patient Instructions (Addendum)
[facility] Discharge Instructions  RECOMMENDATIONS MADE BY THE CONSULTANT AND ANY TEST RESULTS WILL BE SENT TO YOUR REFERRING PHYSICIAN.  We will plan for pentamidine treatment tomorrow.  Start Neupogen injections today. White blood cells are low. Continue to use neutropenic precautions. If any fever =/> 100.5 you need to go to the emergency room. Continue acyclovir as ordered.  Continue prednisone as ordered.  Thank you for choosing [name] to provide your oncology and hematology care.  To afford each patient quality time with our providers, please arrive at least 15 minutes before your scheduled appointment time.  With your help, our goal is to use those 15 minutes to complete the necessary work-up to ensure our physicians have the information they need to help with your evaluation and healthcare recommendations.    Effective January 1st, 2014, we ask that you re-schedule your appointment with our physicians should you arrive 10 or more minutes late for your appointment.  We strive to give you quality time with our providers, and arriving late affects you and other patients whose appointments are after yours.    Again, thank you for choosing [facility].  Our hope is that these requests will decrease the amount of time that you wait before being seen by our physicians.       _____________________________________________________________  Should you have questions after your visit to [facility], please contact our office at [phone] between the hours of 8:30 a.m. and 5:00 p.m.  Voicemails left after 4:30 p.m. will not be returned until the following business day.  For prescription refill requests, have your pharmacy contact our office with your prescription refill request.

## 2012-09-25 NOTE — Progress Notes (Signed)
[name] [name] presents today for injection per MD orders. Neupogen 480 mcg administered SQ in left Abdomen. Administration without incident. Patient tolerated well.  

## 2012-09-26 ENCOUNTER — Encounter (HOSPITAL_BASED_OUTPATIENT_CLINIC_OR_DEPARTMENT_OTHER): Payer: BC Managed Care – PPO

## 2012-09-26 ENCOUNTER — Emergency Department (HOSPITAL_COMMUNITY)
Admission: RE | Admit: 2012-09-26 | Discharge: 2012-09-26 | Disposition: A | Discharge status: Home / Self Care | Payer: BC Managed Care – PPO | Source: Ambulatory Visit

## 2012-09-26 VITALS — BP 133/87 | HR 117 | Temp 98.5°F | Resp 18

## 2012-09-26 DIAGNOSIS — C8119 Nodular sclerosis classical Hodgkin lymphoma, extranodal and solid organ sites: Secondary | ICD-10-CM

## 2012-09-26 DIAGNOSIS — C8190 Hodgkin lymphoma, unspecified, unspecified site: Secondary | ICD-10-CM

## 2012-09-26 DIAGNOSIS — Z5189 Encounter for other specified aftercare: Secondary | ICD-10-CM

## 2012-09-26 MED ORDER — PENTAMIDINE ISETHIONATE 300 MG IN SOLR
300.0000 mg | Freq: Once | RESPIRATORY_TRACT | Status: AC
  Administered 2012-09-26: 300 mg via RESPIRATORY_TRACT
  Filled 2012-09-26: qty 300

## 2012-09-26 MED ORDER — FILGRASTIM 480 MCG/1.6ML IJ SOLN
480.0000 ug | Freq: Once | INTRAMUSCULAR | Status: AC
  Administered 2012-09-26: 480 ug via SUBCUTANEOUS
  Filled 2012-09-26: qty 1.6

## 2012-09-26 NOTE — Progress Notes (Unsigned)
[name] [name] presents today for injection per MD orders. Neupogen 480 mcg administered SQ in left Abdomen. Administration without incident. Patient tolerated well.  

## 2012-09-27 ENCOUNTER — Emergency Department (HOSPITAL_COMMUNITY): Payer: BC Managed Care – PPO

## 2012-09-27 ENCOUNTER — Inpatient Hospital Stay (HOSPITAL_COMMUNITY)
Admission: EM | Admit: 2012-09-27 | Discharge: 2012-09-30 | DRG: 423 | Disposition: A | Discharge status: Home / Self Care | Payer: BC Managed Care – PPO | Attending: Internal Medicine | Admitting: Internal Medicine

## 2012-09-27 ENCOUNTER — Ambulatory Visit (HOSPITAL_COMMUNITY): Payer: BC Managed Care – PPO

## 2012-09-27 ENCOUNTER — Encounter (HOSPITAL_BASED_OUTPATIENT_CLINIC_OR_DEPARTMENT_OTHER): Payer: BC Managed Care – PPO

## 2012-09-27 ENCOUNTER — Encounter (HOSPITAL_COMMUNITY): Payer: Self-pay

## 2012-09-27 VITALS — BP 120/72 | HR 113 | Temp 99.2°F | Resp 20

## 2012-09-27 DIAGNOSIS — R000 Tachycardia, unspecified: Secondary | ICD-10-CM | POA: Diagnosis present

## 2012-09-27 DIAGNOSIS — IMO0001 Reserved for inherently not codable concepts without codable children: Secondary | ICD-10-CM | POA: Diagnosis present

## 2012-09-27 DIAGNOSIS — R5081 Fever presenting with conditions classified elsewhere: Secondary | ICD-10-CM | POA: Diagnosis present

## 2012-09-27 DIAGNOSIS — D709 Neutropenia, unspecified: Secondary | ICD-10-CM | POA: Diagnosis present

## 2012-09-27 DIAGNOSIS — E860 Dehydration: Secondary | ICD-10-CM | POA: Diagnosis present

## 2012-09-27 DIAGNOSIS — IMO0002 Reserved for concepts with insufficient information to code with codable children: Secondary | ICD-10-CM

## 2012-09-27 DIAGNOSIS — J9859 Other diseases of mediastinum, not elsewhere classified: Secondary | ICD-10-CM

## 2012-09-27 DIAGNOSIS — F32A Depression, unspecified: Secondary | ICD-10-CM | POA: Diagnosis present

## 2012-09-27 DIAGNOSIS — F341 Dysthymic disorder: Secondary | ICD-10-CM | POA: Diagnosis present

## 2012-09-27 DIAGNOSIS — R7881 Bacteremia: Principal | ICD-10-CM | POA: Diagnosis present

## 2012-09-27 DIAGNOSIS — I10 Essential (primary) hypertension: Secondary | ICD-10-CM | POA: Diagnosis present

## 2012-09-27 DIAGNOSIS — R5383 Other fatigue: Secondary | ICD-10-CM

## 2012-09-27 DIAGNOSIS — R0600 Dyspnea, unspecified: Secondary | ICD-10-CM

## 2012-09-27 DIAGNOSIS — D61810 Antineoplastic chemotherapy induced pancytopenia: Secondary | ICD-10-CM

## 2012-09-27 DIAGNOSIS — L259 Unspecified contact dermatitis, unspecified cause: Secondary | ICD-10-CM | POA: Diagnosis present

## 2012-09-27 DIAGNOSIS — L309 Dermatitis, unspecified: Secondary | ICD-10-CM

## 2012-09-27 DIAGNOSIS — G2581 Restless legs syndrome: Secondary | ICD-10-CM

## 2012-09-27 DIAGNOSIS — G4730 Sleep apnea, unspecified: Secondary | ICD-10-CM | POA: Diagnosis present

## 2012-09-27 DIAGNOSIS — Z809 Family history of malignant neoplasm, unspecified: Secondary | ICD-10-CM

## 2012-09-27 DIAGNOSIS — B9689 Other specified bacterial agents as the cause of diseases classified elsewhere: Secondary | ICD-10-CM | POA: Diagnosis present

## 2012-09-27 DIAGNOSIS — Z9221 Personal history of antineoplastic chemotherapy: Secondary | ICD-10-CM

## 2012-09-27 DIAGNOSIS — R509 Fever, unspecified: Secondary | ICD-10-CM

## 2012-09-27 DIAGNOSIS — Z79899 Other long term (current) drug therapy: Secondary | ICD-10-CM

## 2012-09-27 DIAGNOSIS — M797 Fibromyalgia: Secondary | ICD-10-CM

## 2012-09-27 DIAGNOSIS — C8190 Hodgkin lymphoma, unspecified, unspecified site: Secondary | ICD-10-CM | POA: Diagnosis present

## 2012-09-27 DIAGNOSIS — E876 Hypokalemia: Secondary | ICD-10-CM | POA: Diagnosis present

## 2012-09-27 DIAGNOSIS — A498 Other bacterial infections of unspecified site: Secondary | ICD-10-CM | POA: Diagnosis present

## 2012-09-27 DIAGNOSIS — T451X5A Adverse effect of antineoplastic and immunosuppressive drugs, initial encounter: Secondary | ICD-10-CM | POA: Diagnosis present

## 2012-09-27 DIAGNOSIS — Z882 Allergy status to sulfonamides status: Secondary | ICD-10-CM

## 2012-09-27 DIAGNOSIS — F329 Major depressive disorder, single episode, unspecified: Secondary | ICD-10-CM

## 2012-09-27 DIAGNOSIS — R5381 Other malaise: Secondary | ICD-10-CM

## 2012-09-27 LAB — CBC WITH DIFFERENTIAL/PLATELET
Band Neutrophils: 0 % (ref 0–10)
Basophils Absolute: 0 10*3/uL (ref 0.0–0.1)
Basophils Relative: 0 % (ref 0–1)
Blasts: 0 %
Eosinophils Absolute: 0 10*3/uL (ref 0.0–0.7)
Eosinophils Relative: 0 % (ref 0–5)
HCT: 28.8 % — ABNORMAL LOW (ref 36.0–46.0)
Hemoglobin: 10.4 g/dL — ABNORMAL LOW (ref 12.0–15.0)
Lymphocytes Relative: 72 % — ABNORMAL HIGH (ref 12–46)
Lymphs Abs: 0.1 10*3/uL — ABNORMAL LOW (ref 0.7–4.0)
MCH: 31.7 pg (ref 26.0–34.0)
MCHC: 36.1 g/dL — ABNORMAL HIGH (ref 30.0–36.0)
MCV: 87.8 fL (ref 78.0–100.0)
Metamyelocytes Relative: 0 %
Monocytes Absolute: 0 10*3/uL — ABNORMAL LOW (ref 0.1–1.0)
Monocytes Relative: 8 % (ref 3–12)
Myelocytes: 0 %
Neutro Abs: 0 10*3/uL — ABNORMAL LOW (ref 1.7–7.7)
Neutrophils Relative %: 20 % — ABNORMAL LOW (ref 43–77)
Platelets: 42 10*3/uL — ABNORMAL LOW (ref 150–400)
Promyelocytes Absolute: 0 %
RBC: 3.28 MIL/uL — ABNORMAL LOW (ref 3.87–5.11)
RDW: 13 % (ref 11.5–15.5)
WBC: 0.1 10*3/uL — CL (ref 4.0–10.5)
nRBC: 0 /100 WBC

## 2012-09-27 LAB — BASIC METABOLIC PANEL
BUN: 25 mg/dL — ABNORMAL HIGH (ref 6–23)
CO2: 29 mEq/L (ref 19–32)
Calcium: 9.3 mg/dL (ref 8.4–10.5)
Chloride: 94 mEq/L — ABNORMAL LOW (ref 96–112)
Creatinine, Ser: 0.7 mg/dL (ref 0.50–1.10)
GFR calc Af Amer: >90 mL/min (ref >90)
GFR calc non Af Amer: >90 mL/min (ref >90)
Glucose, Bld: 110 mg/dL — ABNORMAL HIGH (ref 70–99)
Potassium: 3.2 mEq/L — ABNORMAL LOW (ref 3.5–5.1)
Sodium: 133 mEq/L — ABNORMAL LOW (ref 135–145)

## 2012-09-27 LAB — LACTIC ACID, PLASMA: Lactic Acid, Venous: 1.3 mmol/L (ref 0.5–2.2)

## 2012-09-27 LAB — URINALYSIS, ROUTINE W REFLEX MICROSCOPIC
Bilirubin Urine: NEGATIVE
Glucose, UA: NEGATIVE mg/dL
Hgb urine dipstick: NEGATIVE
Ketones, ur: NEGATIVE mg/dL
Leukocytes, UA: NEGATIVE
Nitrite: NEGATIVE
Protein, ur: NEGATIVE mg/dL
Specific Gravity, Urine: 1.025 (ref 1.005–1.030)
Urobilinogen, UA: 0.2 mg/dL (ref 0.0–1.0)
pH: 6 (ref 5.0–8.0)

## 2012-09-27 LAB — PREGNANCY, URINE: Preg Test, Ur: NEGATIVE

## 2012-09-27 MED ORDER — METHOCARBAMOL 500 MG PO TABS
500.0000 mg | ORAL_TABLET | Freq: Two times a day (BID) | ORAL | Status: DC
  Filled 2012-09-27 (×2): qty 1

## 2012-09-27 MED ORDER — POTASSIUM CHLORIDE CRYS ER 10 MEQ PO TBCR
10.0000 meq | EXTENDED_RELEASE_TABLET | Freq: Every day | ORAL | Status: DC
  Filled 2012-09-27: qty 1

## 2012-09-27 MED ORDER — PREDNISOLONE ACETATE 1 % OP SUSP
1.0000 [drp] | Freq: Three times a day (TID) | OPHTHALMIC | Status: DC
  Filled 2012-09-27: qty 1

## 2012-09-27 MED ORDER — ACYCLOVIR 400 MG PO TABS
400.0000 mg | ORAL_TABLET | Freq: Two times a day (BID) | ORAL | Status: DC
  Filled 2012-09-27 (×6): qty 1

## 2012-09-27 MED ORDER — ACYCLOVIR 200 MG PO CAPS
400.0000 mg | ORAL_CAPSULE | Freq: Two times a day (BID) | ORAL | Status: DC
  Filled 2012-09-27: qty 2

## 2012-09-27 MED ORDER — LISINOPRIL-HYDROCHLOROTHIAZIDE 20-12.5 MG PO TABS: 1.0000 | ORAL_TABLET | Freq: Every day | ORAL | Status: DC

## 2012-09-27 MED ORDER — DEXTROSE 5 % IV SOLN
2.0000 g | Freq: Three times a day (TID) | INTRAVENOUS | Status: DC
  Administered 2012-09-27 – 2012-09-30 (×9): 2 g via INTRAVENOUS
  Filled 2012-09-27 (×13): qty 2

## 2012-09-27 MED ORDER — KRILL OIL 1000 MG PO CAPS: 2.0000 | ORAL_CAPSULE | Freq: Every day | ORAL | Status: DC

## 2012-09-27 MED ORDER — ONDANSETRON HCL 4 MG PO TABS: 4.0000 mg | ORAL_TABLET | Freq: Four times a day (QID) | ORAL | Status: DC | PRN

## 2012-09-27 MED ORDER — ALPRAZOLAM 1 MG PO TABS: 1.0000 mg | ORAL_TABLET | Freq: Two times a day (BID) | ORAL | Status: DC | PRN

## 2012-09-27 MED ORDER — LORAZEPAM 1 MG PO TABS: 1.0000 mg | ORAL_TABLET | Freq: Every evening | ORAL | Status: DC | PRN

## 2012-09-27 MED ORDER — SENNA 8.6 MG PO TABS
1.0000 | ORAL_TABLET | Freq: Every day | ORAL | Status: DC
  Filled 2012-09-27: qty 1

## 2012-09-27 MED ORDER — TIZANIDINE HCL 4 MG PO TABS
4.0000 mg | ORAL_TABLET | Freq: Every day | ORAL | Status: DC
  Filled 2012-09-27 (×3): qty 2

## 2012-09-27 MED ORDER — ONDANSETRON HCL 4 MG PO TABS: 8.0000 mg | ORAL_TABLET | Freq: Three times a day (TID) | ORAL | Status: DC | PRN

## 2012-09-27 MED ORDER — HYDROCHLOROTHIAZIDE 12.5 MG PO CAPS
12.5000 mg | ORAL_CAPSULE | Freq: Every day | ORAL | Status: DC
  Filled 2012-09-27: qty 1

## 2012-09-27 MED ORDER — LISINOPRIL 10 MG PO TABS
20.0000 mg | ORAL_TABLET | Freq: Every day | ORAL | Status: DC
  Filled 2012-09-27: qty 2

## 2012-09-27 MED ORDER — NORETHIN ACE-ETH ESTRAD-FE 1-20 MG-MCG PO TABS: 1.0000 | ORAL_TABLET | Freq: Every day | ORAL | Status: DC

## 2012-09-27 MED ORDER — FILGRASTIM 480 MCG/1.6ML IJ SOLN
480.0000 ug | Freq: Every day | INTRAMUSCULAR | Status: DC
  Filled 2012-09-27 (×3): qty 1.6

## 2012-09-27 MED ORDER — VITAMIN D3 25 MCG (1000 UNIT) PO TABS
1000.0000 [IU] | ORAL_TABLET | Freq: Every day | ORAL | Status: DC
  Filled 2012-09-27 (×5): qty 1

## 2012-09-27 MED ORDER — ACETAMINOPHEN 500 MG PO TABS: 1000.0000 mg | ORAL_TABLET | Freq: Four times a day (QID) | ORAL | Status: DC | PRN

## 2012-09-27 MED ORDER — GABAPENTIN 300 MG PO CAPS
600.0000 mg | ORAL_CAPSULE | Freq: Every day | ORAL | Status: DC
  Filled 2012-09-27: qty 2

## 2012-09-27 MED ORDER — SODIUM CHLORIDE 0.9 % IV SOLN
INTRAVENOUS | Status: DC
  Administered 2012-09-27 – 2012-09-28 (×3): via INTRAVENOUS
  Administered 2012-09-29: 20 mL/h via INTRAVENOUS

## 2012-09-27 MED ORDER — HYDROCOD POLST-CHLORPHEN POLST 10-8 MG/5ML PO LQCR: 5.0000 mL | Freq: Two times a day (BID) | ORAL | Status: DC | PRN

## 2012-09-27 MED ORDER — ONDANSETRON HCL 4 MG/2ML IJ SOLN: 4.0000 mg | Freq: Four times a day (QID) | INTRAMUSCULAR | Status: DC | PRN

## 2012-09-27 MED ORDER — FILGRASTIM 480 MCG/1.6ML IJ SOLN
480.0000 ug | Freq: Every day | INTRAMUSCULAR | Status: DC
  Filled 2012-09-27 (×2): qty 1.6

## 2012-09-27 MED ORDER — TEMAZEPAM 15 MG PO CAPS: 15.0000 mg | ORAL_CAPSULE | Freq: Every evening | ORAL | Status: DC | PRN

## 2012-09-27 MED ORDER — PREDNISONE 20 MG PO TABS
80.0000 mg | ORAL_TABLET | Freq: Every day | ORAL | Status: DC
  Filled 2012-09-27: qty 4

## 2012-09-27 MED ORDER — FILGRASTIM 480 MCG/1.6ML IJ SOLN
480.0000 ug | Freq: Once | INTRAMUSCULAR | Status: AC
  Administered 2012-09-27: 480 ug via SUBCUTANEOUS
  Filled 2012-09-27: qty 1.6

## 2012-09-27 MED ORDER — FAMOTIDINE 20 MG PO TABS
20.0000 mg | ORAL_TABLET | Freq: Two times a day (BID) | ORAL | Status: DC
  Filled 2012-09-27 (×2): qty 1

## 2012-09-27 NOTE — ED Notes (Signed)
Pt unable to void at this time. 

## 2012-09-27 NOTE — Progress Notes (Signed)
ANTIBIOTIC CONSULT NOTE - INITIAL  Pharmacy Consult for Fortaz (ceftazidime) Indication: febrile neutropenia  Allergies  Allergen Reactions  . Sulfur Anaphylaxis    Patient Measurements:   Last recorded weight = 91Kg  Vital Signs: Temp: 99.6 F (37.6 C) (04/18 1250) Temp src: Oral (04/18 1250) BP: 133/84 mmHg (04/18 1250) Pulse Rate: 117 (04/18 1250) Intake/Output from previous day:   Intake/Output from this shift:    Labs:  Recent Labs  09/25/12 0908 09/27/12 1035  WBC 0.3* 0.1*  HGB 10.9* 10.4*  PLT 107* 42*  CREATININE 0.69 0.70   The CrCl is unknown because both a height and weight (above a minimum accepted value) are required for this calculation. No results found for this basename: VANCOTROUGH, VANCOPEAK, VANCORANDOM, GENTTROUGH, GENTPEAK, GENTRANDOM, TOBRATROUGH, TOBRAPEAK, TOBRARND, AMIKACINPEAK, AMIKACINTROU, AMIKACIN,  in the last 72 hours   Microbiology: Recent Results (from the past 720 hour(s))  CULTURE, BLOOD (ROUTINE X 2)     Status: None   Collection Time    09/27/12 10:38 AM      Result Value Range Status   Specimen Description LEFT ANTECUBITAL   Final   Special Requests BOTTLES DRAWN AEROBIC AND ANAEROBIC  8  CC  EACH   Final   Culture PENDING   Incomplete   Report Status PENDING   Incomplete   Medical History: Past Medical History  Diagnosis Date  . Fibromyalgia   . Eczema   . Depression     parents died close together "was hard"  . Restless leg   . Heart murmur     Told 'nothing to worry about"  No 2D Echo  . Sleep apnea     Sleep Study- 1996- "little apnea- no tx"  . Headache     Birth control pills help  . Hemorrhoid   . Anemia     As a child and while pregnant  . 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 Hypertension     recently dx with hodgkins  . Dyspnea     all the time .".due to the mass"   Medications:  Scheduled:  . acyclovir  400 mg Oral BID  . cholecalciferol  1,000 Units Oral Daily   . famotidine  20 mg Oral BID  . filgrastim (NEUPOGEN)  SQ  480 mcg Subcutaneous q1800  . gabapentin  600 mg Oral QHS  . KRILL OIL  2 capsule Oral Daily  . lisinopril-hydrochlorothiazide  1 tablet Oral Daily  . methocarbamol  500 mg Oral BID  . norethindrone-ethinyl estradiol  1 tablet Oral Daily  . potassium chloride  10 mEq Oral Daily  . prednisoLONE acetate  1 drop Both Eyes TID  . predniSONE  80 mg Oral Daily  . senna  1 tablet Oral QHS  . tiZANidine  4-8 mg Oral QHS   Assessment: 42yo female undergoing chemotherapy for Hodgkin's lymphoma.  Pt c/o fatigue and fever with chills.  SCr is at baseline.  The CrCl is unknown because both a height and weight (above a minimum accepted value) are required for this calculation.  Goal of Therapy:  Eradicate infection.   Plan:  Ceftazidime 2gm IV q8hrs (1st dose now in ED) Monitor labs, renal fxn, and cultures per protocol Duration of therapy per MD  Valrie Hart A 09/27/2012,2:19 PM

## 2012-09-27 NOTE — ED Notes (Signed)
Dr. Clarene Duke requesting rectal temp. Pt un willing to allow staff to check temp. Dr. Clarene Duke aware.

## 2012-09-27 NOTE — ED Notes (Signed)
CRITICAL VALUE ALERT  Critical value received:  WBC 0.1  Date of notification:  09/27/12  Time of notification:  11:07  Critical value read back:yes  Nurse who received alert:  Per S. Settle RN MD notified (1st page):  Clarene Duke  Time of first page: 11:07  MD notified (2nd page):  Time of second page:  Responding MD:  Clarene Duke  Time MD responded:  11:07

## 2012-09-27 NOTE — H&P (Signed)
[name] History and Physical  [name] [name] JXB:[id number] DOB: [birth year] DOA: 09/27/2012  Referring physician: Dr. Clarene Duke, ER physician. PCP: [name], MD  Specialists: Dr. Mariel Sleet oncology.  Chief Complaint: Fatigue., subjective fever.  HPI: [name] [name] is a 42 y.o. female is a lady who is undergoing chemotherapy for Hodgkin's lymphoma, high dose. She is also on high-dose prednisone. She underwent chemotherapy 2 days ago as well as Neulasta and now comes with symptoms of fatigue and subjective fever with some chills. On evaluation in the emergency room, she was found to have a white blood cell count 0.1 and low grade fever of 99. Her oncologist has been contacted and he recommended admission. She has a cough but she received inhaled pentamidine yesterday. She denies any dysuria. There is no abdominal symptoms such as nausea, vomiting or diarrhea.   Review of Systems:  Apart from history of present illness, other systems negative.  Past Medical History  Diagnosis Date  . Fibromyalgia   . Eczema   . Depression     parents died close together "was hard"  . Restless leg   . Heart murmur     Told 'nothing to worry about"  No 2D Echo  . Sleep apnea     Sleep Study- 1996- "little apnea- no tx"  . Headache     Birth control pills help  . Hemorrhoid   . Anemia     As a child and while pregnant  . 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 Hypertension     recently dx with hodgkins  . Dyspnea     all the time .".due to the mass"   Past Surgical History  Procedure Laterality Date  . Wisdom tooth extraction    . Dilation and curettage of uterus  1991  . Mediastinal mass biopsy  03/05/12  . Bone marrow biopsy  03/20/2012  . Portacath placement  03/26/2012    Procedure: INSERTION PORT-A-CATH;  Surgeon: Loreli Slot, MD;  Location: [facility] OR;  Service: Thoracic;  Laterality: N/A;   Social History:  He is married and lives with her  husband. She does not smoke cigarettes, does not drink alcohol.  Allergies  Allergen Reactions  . Sulfur Anaphylaxis    Family History  Problem Relation Age of Onset  . Cancer Paternal Uncle   . Cancer Maternal Grandmother     lung ca mets to throat      Prior to Admission medications   Medication Sig Start Date End Date Taking? Authorizing Provider  acetaminophen (TYLENOL) 500 MG tablet Take 1,000 mg by mouth every 6 (six) hours as needed for pain.   Yes Historical Provider, MD  acyclovir (ZOVIRAX) 400 MG tablet Take 400 mg by mouth 2 (two) times daily. Take throughout chemo.   Yes Historical Provider, MD  ALPRAZolam Prudy Feeler) 1 MG tablet Take 1/2 tablet to 1 tablet four times daily as needed for anxiety. 08/21/12  Yes Randall An, MD  chlorpheniramine-HYDROcodone (TUSSIONEX PENNKINETIC ER) 10-8 MG/5ML LQCR Take 5 mLs by mouth every 12 (twelve) hours as needed. 09/12/12  Yes Ellouise Newer, PA-C  cholecalciferol (VITAMIN D) 1000 UNITS tablet Take 1,000 Units by mouth daily.   Yes Historical Provider, MD  gabapentin (NEURONTIN) 600 MG tablet Take 600 mg by mouth at bedtime.    Yes Historical Provider, MD  KRILL OIL 1000 MG CAPS Take 2 capsules by mouth daily.   Yes Historical Provider, MD  lidocaine-prilocaine (  EMLA) cream Apply topically as needed. Apply a quarter sized amount to port site 1 hour prior to chemo. Do not rub in. Cover with plastic.   Yes Historical Provider, MD  lisinopril-hydrochlorothiazide (PRINZIDE,ZESTORETIC) 20-12.5 MG per tablet Take 1 tablet by mouth daily.   Yes Historical Provider, MD  LORazepam (ATIVAN) 1 MG tablet Take 1 tablet by mouth at bedtime as needed. For nausea 06/11/12  Yes Historical Provider, MD  methocarbamol (ROBAXIN) 500 MG tablet Take 1 tablet (500 mg total) by mouth 2 (two) times daily. Refill given on 08/30/12 per Dr. Mariel Sleet. 08/30/12  Yes Randall An, MD  norethindrone-ethinyl estradiol (JUNEL FE,GILDESS FE,LOESTRIN FE) 1-20 MG-MCG  tablet Take 1 tablet by mouth daily.   Yes Historical Provider, MD  ondansetron (ZOFRAN) 8 MG tablet Take 8 mg by mouth 3 (three) times daily as needed for nausea. Do not take until the 3rd day after chemotherapy due to the premeds (aloxi/emend) that we give you.   Yes Historical Provider, MD  potassium chloride (K-DUR,KLOR-CON) 10 MEQ tablet Take 10 mEq by mouth daily. 06/27/12  Yes Historical Provider, MD  prednisoLONE acetate (PRED FORTE) 1 % ophthalmic suspension Place 1 drop into both eyes 3 (three) times daily. 09/19/12  Yes Maurine Minister Kefalas, PA-C  predniSONE (DELTASONE) 20 MG tablet Take 80 mg by mouth daily. Days 1-14, then off 7 days. #48 1 refill 08/21/12  Yes Historical Provider, MD  ranitidine (ZANTAC) 150 MG capsule Take 150 mg by mouth 2 (two) times daily.    Yes Historical Provider, MD  senna (SENOKOT) 8.6 MG TABS Take 1 tablet by mouth at bedtime.   Yes Historical Provider, MD  temazepam (RESTORIL) 15 MG capsule Take 1 capsule (15 mg total) by mouth at bedtime as needed for sleep. 09/13/12  Yes Maurine Minister Kefalas, PA-C  tiZANidine (ZANAFLEX) 4 MG capsule Take 4-6 mg by mouth at bedtime. Takes 4-6 mg dose depends on severity of spasms.   Yes Historical Provider, MD   Physical Exam: Filed Vitals:   09/27/12 0959 09/27/12 1250  BP: 130/76 133/84  Pulse: 94 117  Temp: 98.7 F (37.1 C) 99.6 F (37.6 C)  TempSrc: Oral Oral  Resp: 17 16  SpO2: 100% 100%     General:  She looks unwell. She is clinically slightly dehydrated. She has a resting tachycardia. She feels warm.   Eyes: No pallor, no jaundice.  ENT: No abnormalities.  Neck: No lymphadenopathy.  Cardiovascular: Heart sounds are present without murmurs. Resting tachycardia.  Respiratory: Lung fields are clear.  Abdomen: Soft, nontender. No splenomegaly. No hepatomegaly.  Skin: No rash  Musculoskeletal: No acute joint abnormalities.  Psychiatric: Appropriate affect.  Neurologic: Alert and orientated without any  focal neurological signs.  Labs on Admission:  Basic Metabolic Panel:  Recent Labs Lab 09/25/12 0908 09/27/12 1035  NA 135 133*  K 3.8 3.2*  CL 98 94*  CO2 25 29  GLUCOSE 156* 110*  BUN 26* 25*  CREATININE 0.69 0.70  CALCIUM 9.1 9.3   Liver Function Tests:  Recent Labs Lab 09/25/12 0908  AST 13  ALT 19  ALKPHOS 42  BILITOT 0.4  PROT 6.3  ALBUMIN 3.4*     CBC:  Recent Labs Lab 09/25/12 0908 09/27/12 1035  WBC 0.3* 0.1*  NEUTROABS 0.3* 0.0*  HGB 10.9* 10.4*  HCT 31.2* 28.8*  MCV 90.2 87.8  PLT 107* 42*      Radiological Exams on Admission: Dg Chest Portable 1 View  09/27/2012  *RADIOLOGY  REPORT*  Clinical Data: Fever and fatigue  PORTABLE CHEST - 1 VIEW  Comparison: 03/26/2012  Findings: There is a left chest wall porta-catheter with tip in the projection the SVC.  Normal heart size.  No pleural effusion or edema.  Interval decrease in size of anterior mediastinal mass compared with previous exam.  No airspace consolidation, pleural effusion or interstitial edema.  IMPRESSION:  1.  No acute cardiopulmonary abnormalities.   Original Report Authenticated By: Signa Kell, M.D.       Assessment/Plan   1. Neutropenic fever in a patient of oncology undergoing chemotherapy for Hodgkin's lymphoma. 2. Hypertension. 3. Dehydration.  Plan: 1. Admit to medical floor. 2. Intravenous fluids. 3. Empirical intravenous antibiotics with Elita Quick after culturing urine and blood. 4. Monitor CBC closely. Neulasta. 5. Oncology consultation on Monday.   Code Status: Full code.   Family Communication: Discussed plan with patient at the bedside.   Disposition Plan: Home when medically stable.   Time spent: 45 minutes.  Wilson Singer [name] Pager [phone]  If 7PM-7AM, please contact night-coverage [email or web address] Password [email or web address] 09/27/2012, 1:58 PM

## 2012-09-27 NOTE — ED Provider Notes (Signed)
History     CSN: [id number]  Arrival date & time 09/27/12  1002   First MD Initiated Contact with Patient 09/27/12 1002      Chief Complaint  Patient presents with  . Fever  . Fatigue    HPI Pt was seen at 1010.   Per pt, c/o gradual onset and worsening of constant generalized weakness/fatigue for the past 2 days. Has been associated with subjective home fevers/chills. LD chemo this past week.  Pt was at her outpatient appt today to receive neulasta injection, but was sent to the ED for further eval/admit due to her symptoms. Endorses cough since receiving inhaled pentamidine yesterday. Denies N/V/D, no abd pain, no back pain, no CP/SOB.       Past Medical History  Diagnosis Date  . Fibromyalgia   . Eczema   . Depression     parents died close together "was hard"  . Restless leg   . Heart murmur     Told 'nothing to worry about"  No 2D Echo  . Sleep apnea     Sleep Study- 1996- "little apnea- no tx"  . Headache     Birth control pills help  . Hemorrhoid   . Anemia     As a child and while pregnant  . 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 Hypertension     recently dx with hodgkins  . Dyspnea     all the time .".due to the mass"    Past Surgical History  Procedure Laterality Date  . Wisdom tooth extraction    . Dilation and curettage of uterus  1991  . Mediastinal mass biopsy  03/05/12  . Bone marrow biopsy  03/20/2012  . Portacath placement  03/26/2012    Procedure: INSERTION PORT-A-CATH;  Surgeon: Loreli Slot, MD;  Location: [facility] OR;  Service: Thoracic;  Laterality: N/A;    Family History  Problem Relation Age of Onset  . Cancer Paternal Uncle   . Cancer Maternal Grandmother     lung ca mets to throat    History  Substance Use Topics  . Smoking status: Never Smoker   . Smokeless tobacco: Never Used  . Alcohol Use: No     Comment: occasionaly 2 x month; rarely    Review of Systems ROS: Statement: All systems  negative except as marked or noted in the HPI; Constitutional: +subjective home fevers and chills, generalized weakness/fatigue.. ; ; Eyes: Negative for eye pain, redness and discharge. ; ; ENMT: Negative for ear pain, hoarseness, nasal congestion, sinus pressure and sore throat. ; ; Cardiovascular: Negative for chest pain, palpitations, diaphoresis, dyspnea and peripheral edema. ; ; Respiratory: +cough. Negative for wheezing and stridor. ; ; Gastrointestinal: Negative for nausea, vomiting, diarrhea, abdominal pain, blood in stool, hematemesis, jaundice and rectal bleeding. . ; ; Genitourinary: Negative for dysuria, flank pain and hematuria. ; ; Musculoskeletal: Negative for back pain and neck pain. Negative for swelling and trauma.; ; Skin: Negative for pruritus, rash, abrasions, blisters, bruising and skin lesion.; ; Neuro: Negative for headache, lightheadedness and neck stiffness. Negative for weakness, altered level of consciousness , altered mental status, extremity weakness, paresthesias, involuntary movement, seizure and syncope.       Allergies  Sulfur  Home Medications   Current Outpatient Rx  Name  Route  Sig  Dispense  Refill  . acetaminophen (TYLENOL) 500 MG tablet   Oral   Take 1,000 mg by mouth every 6 (  six) hours as needed for pain.         Marland Kitchen acyclovir (ZOVIRAX) 400 MG tablet   Oral   Take 400 mg by mouth 2 (two) times daily. Take throughout chemo.         Marland Kitchen ALPRAZolam (XANAX) 1 MG tablet      Take 1/2 tablet to 1 tablet four times daily as needed for anxiety.   100 tablet   0   . chlorpheniramine-HYDROcodone (TUSSIONEX PENNKINETIC ER) 10-8 MG/5ML LQCR   Oral   Take 5 mLs by mouth every 12 (twelve) hours as needed.   140 mL   0   . cholecalciferol (VITAMIN D) 1000 UNITS tablet   Oral   Take 1,000 Units by mouth daily.         Marland Kitchen gabapentin (NEURONTIN) 600 MG tablet   Oral   Take 600 mg by mouth at bedtime.          Marland Kitchen KRILL OIL 1000 MG CAPS   Oral   Take  2 capsules by mouth daily.         Marland Kitchen lidocaine-prilocaine (EMLA) cream   Topical   Apply topically as needed. Apply a quarter sized amount to port site 1 hour prior to chemo. Do not rub in. Cover with plastic.         Marland Kitchen lisinopril-hydrochlorothiazide (PRINZIDE,ZESTORETIC) 20-12.5 MG per tablet   Oral   Take 1 tablet by mouth daily.         Marland Kitchen LORazepam (ATIVAN) 1 MG tablet   Oral   Take 1 tablet by mouth at bedtime as needed. For nausea         . methocarbamol (ROBAXIN) 500 MG tablet   Oral   Take 1 tablet (500 mg total) by mouth 2 (two) times daily. Refill given on 08/30/12 per Dr. Mariel Sleet.   60 tablet   0   . norethindrone-ethinyl estradiol (JUNEL FE,GILDESS FE,LOESTRIN FE) 1-20 MG-MCG tablet   Oral   Take 1 tablet by mouth daily.         . ondansetron (ZOFRAN) 8 MG tablet   Oral   Take 8 mg by mouth 3 (three) times daily as needed for nausea. Do not take until the 3rd day after chemotherapy due to the premeds (aloxi/emend) that we give you.         . potassium chloride (K-DUR,KLOR-CON) 10 MEQ tablet   Oral   Take 10 mEq by mouth daily.         . prednisoLONE acetate (PRED FORTE) 1 % ophthalmic suspension   Both Eyes   Place 1 drop into both eyes 3 (three) times daily.   5 mL   0   . predniSONE (DELTASONE) 20 MG tablet   Oral   Take 80 mg by mouth daily. Days 1-14, then off 7 days. #48 1 refill         . ranitidine (ZANTAC) 150 MG capsule   Oral   Take 150 mg by mouth 2 (two) times daily.          Marland Kitchen senna (SENOKOT) 8.6 MG TABS   Oral   Take 1 tablet by mouth at bedtime.         . temazepam (RESTORIL) 15 MG capsule   Oral   Take 1 capsule (15 mg total) by mouth at bedtime as needed for sleep.   45 capsule   1     Take 2 - 3 tabs at bedtime as needed for  sleep.  M ...   . tiZANidine (ZANAFLEX) 4 MG capsule   Oral   Take 4-6 mg by mouth at bedtime. Takes 4-6 mg dose depends on severity of spasms.           BP 133/84  Pulse 117   Temp(Src) 99.6 F (37.6 C) (Oral)  Resp 16  SpO2 100%  Physical Exam 1015: Physical examination:  Nursing notes reviewed; Vital signs and O2 SAT reviewed;  Constitutional: Well developed, Well nourished, In no acute distress; Head:  Normocephalic, atraumatic; Eyes: EOMI, PERRL, No scleral icterus; ENMT: Mouth and pharynx normal, Mucous membranes dry; Neck: Supple, Full range of motion, No lymphadenopathy; Cardiovascular: Regular rate and rhythm, No gallop; Respiratory: Breath sounds clear & equal bilaterally, No rales, rhonchi, wheezes.  Speaking full sentences with ease, Normal respiratory effort/excursion; Chest: Nontender, Movement normal; Abdomen: Soft, Nontender, Nondistended, Normal bowel sounds; Genitourinary: No CVA tenderness; Extremities: Pulses normal, No tenderness, No edema, No calf edema or asymmetry.; Neuro: AA&Ox3, Major CN grossly intact.  Speech clear. No gross focal motor or sensory deficits in extremities.; Skin: Color normal, Warm, Dry.   ED Course  Procedures   1005:  T/C received from Dr. Mariel Sleet: states he would like pt admitted to hospitalist service, will need either Primaxin or Fortaz IV.   1330:   ANC is 20.  No fever while in the ED.  VS remain stable. Dx and testing d/w pt and family.  Questions answered.  Verb understanding, agreeable to admit.  T/C to [facility] Dr. Karilyn Cota, case discussed, including:  HPI, pertinent PM/SHx, VS/PE, dx testing, ED course and treatment:  Agreeable to admit, requests he will come to ED for eval.    MDM  MDM Reviewed: previous chart, nursing note and vitals Reviewed previous: labs Interpretation: labs and x-ray   Results for orders placed during the hospital encounter of 09/27/12  CULTURE, BLOOD (ROUTINE X 2)      Result Value Range   Specimen Description LEFT ANTECUBITAL     Special Requests BOTTLES DRAWN AEROBIC AND ANAEROBIC  8  CC  EACH     Culture PENDING     Report Status PENDING    URINALYSIS, ROUTINE W REFLEX  MICROSCOPIC      Result Value Range   Color, Urine YELLOW  YELLOW   APPearance CLEAR  CLEAR   Specific Gravity, Urine 1.025  1.005 - 1.030   pH 6.0  5.0 - 8.0   Glucose, UA NEGATIVE  NEGATIVE mg/dL   Hgb urine dipstick NEGATIVE  NEGATIVE   Bilirubin Urine NEGATIVE  NEGATIVE   Ketones, ur NEGATIVE  NEGATIVE mg/dL   Protein, ur NEGATIVE  NEGATIVE mg/dL   Urobilinogen, UA 0.2  0.0 - 1.0 mg/dL   Nitrite NEGATIVE  NEGATIVE   Leukocytes, UA NEGATIVE  NEGATIVE  PREGNANCY, URINE      Result Value Range   Preg Test, Ur NEGATIVE  NEGATIVE  BASIC METABOLIC PANEL      Result Value Range   Sodium 133 (*) 135 - 145 mEq/L   Potassium 3.2 (*) 3.5 - 5.1 mEq/L   Chloride 94 (*) 96 - 112 mEq/L   CO2 29  19 - 32 mEq/L   Glucose, Bld 110 (*) 70 - 99 mg/dL   BUN 25 (*) 6 - 23 mg/dL   Creatinine, Ser 7.82  0.50 - 1.10 mg/dL   Calcium 9.3  8.4 - 95.6 mg/dL   GFR calc non Af Amer >90  >90 mL/min   GFR  calc Af Amer >90  >90 mL/min  CBC WITH DIFFERENTIAL      Result Value Range   WBC 0.1 (*) 4.0 - 10.5 K/uL   RBC 3.28 (*) 3.87 - 5.11 MIL/uL   Hemoglobin 10.4 (*) 12.0 - 15.0 g/dL   HCT 81.1 (*) 91.4 - 78.2 %   MCV 87.8  78.0 - 100.0 fL   MCH 31.7  26.0 - 34.0 pg   MCHC 36.1 (*) 30.0 - 36.0 g/dL   RDW 95.6  21.3 - 08.6 %   Platelets 42 (*) 150 - 400 K/uL   Neutrophils Relative 20 (*) 43 - 77 %   Lymphocytes Relative 72 (*) 12 - 46 %   Monocytes Relative 8  3 - 12 %   Eosinophils Relative 0  0 - 5 %   Basophils Relative 0  0 - 1 %   Band Neutrophils 0  0 - 10 %   Metamyelocytes Relative 0     Myelocytes 0     Promyelocytes Absolute 0     Blasts 0     nRBC 0  0 /100 WBC   Neutro Abs 0.0 (*) 1.7 - 7.7 K/uL   Lymphs Abs 0.1 (*) 0.7 - 4.0 K/uL   Monocytes Absolute 0.0 (*) 0.1 - 1.0 K/uL   Eosinophils Absolute 0.0  0.0 - 0.7 K/uL   Basophils Absolute 0.0  0.0 - 0.1 K/uL  LACTIC ACID, PLASMA      Result Value Range   Lactic Acid, Venous 1.3  0.5 - 2.2 mmol/L   Dg Chest Portable 1  View 09/27/2012  *RADIOLOGY REPORT*  Clinical Data: Fever and fatigue  PORTABLE CHEST - 1 VIEW  Comparison: 03/26/2012  Findings: There is a left chest wall porta-catheter with tip in the projection the SVC.  Normal heart size.  No pleural effusion or edema.  Interval decrease in size of anterior mediastinal mass compared with previous exam.  No airspace consolidation, pleural effusion or interstitial edema.  IMPRESSION:  1.  No acute cardiopulmonary abnormalities.   Original Report Authenticated By: Signa Kell, M.D.              Laray Anger, DO 09/28/12 [phone]

## 2012-09-27 NOTE — Progress Notes (Signed)
09/27/2012 9:20 AM [name] [name] presents for her Neupogen injection - complains of extreme fatigue, cough s/p Pentamidine treatment yesterday.  Noted to be tachycardic with low grade temperature.  Dr. Mariel Sleet paged, and patient advised of potential for being taken to the ER due to symptoms.    09/27/2012 1000 Spoke w/ Dr. Mariel Sleet and advised of pt's symptoms and VS - directed to take patient to ER.  [name] [name] advised of plan of care - is not keen on idea of going to ED, however, she did consent to go.  Verbal about not liking hospitals.

## 2012-09-27 NOTE — Progress Notes (Signed)
PHARMACIST - PHYSICIAN ORDER COMMUNICATION  CONCERNING: P&T Medication Policy on Herbal Medications  DESCRIPTION:  This patient's order for:  [employer]  has been noted.  This product(s) is classified as an "herbal" or natural product. Due to a lack of definitive safety studies or FDA approval, nonstandard manufacturing practices, plus the potential risk of unknown drug-drug interactions while on inpatient medications, the Pharmacy and Therapeutics Committee does not permit the use of "herbal" or natural products of this type within [facility].   ACTION TAKEN: The pharmacy department is unable to verify this order at this time and your patient has been informed of this safety policy. Please reevaluate patient's clinical condition at discharge and address if the herbal or natural product(s) should be resumed at that time.  Thank you, S. [name], PharmD

## 2012-09-27 NOTE — ED Notes (Signed)
Pt here from cancer center after receiving last chemo therapy this past week. Pt started with fever and fatigue last pm. Pt alert upon arrival. nad noted. resp even/nonlabored. Family at bsd.

## 2012-09-27 NOTE — ED Notes (Signed)
Assisted pt to br 

## 2012-09-27 NOTE — ED Notes (Signed)
Pt has port. Blood will not pull from port per Abner Greenspan RN when attempted. Pt refusing 2nd blood culture. Dr. Clarene Duke aware. Family at bsd.

## 2012-09-28 ENCOUNTER — Ambulatory Visit (HOSPITAL_COMMUNITY): Payer: BC Managed Care – PPO

## 2012-09-28 LAB — COMPREHENSIVE METABOLIC PANEL
ALT: 13 U/L (ref 0–35)
AST: 7 U/L (ref 0–37)
Albumin: 3 g/dL — ABNORMAL LOW (ref 3.5–5.2)
Alkaline Phosphatase: 41 U/L (ref 39–117)
BUN: 18 mg/dL (ref 6–23)
CO2: 28 mEq/L (ref 19–32)
Calcium: 8.7 mg/dL (ref 8.4–10.5)
Chloride: 98 mEq/L (ref 96–112)
Creatinine, Ser: 0.64 mg/dL (ref 0.50–1.10)
GFR calc Af Amer: >90 mL/min (ref >90)
GFR calc non Af Amer: >90 mL/min (ref >90)
Glucose, Bld: 133 mg/dL — ABNORMAL HIGH (ref 70–99)
Potassium: 3.3 mEq/L — ABNORMAL LOW (ref 3.5–5.1)
Sodium: 136 mEq/L (ref 135–145)
Total Bilirubin: 0.5 mg/dL (ref 0.3–1.2)
Total Protein: 5.7 g/dL — ABNORMAL LOW (ref 6.0–8.3)

## 2012-09-28 LAB — CBC
HCT: 23.7 % — ABNORMAL LOW (ref 36.0–46.0)
Hemoglobin: 8.7 g/dL — ABNORMAL LOW (ref 12.0–15.0)
MCH: 31.5 pg (ref 26.0–34.0)
MCHC: 36.7 g/dL — ABNORMAL HIGH (ref 30.0–36.0)
MCV: 85.9 fL (ref 78.0–100.0)
Platelets: 31 10*3/uL — ABNORMAL LOW (ref 150–400)
RBC: 2.76 MIL/uL — ABNORMAL LOW (ref 3.87–5.11)
RDW: 12.6 % (ref 11.5–15.5)
WBC: 0.1 10*3/uL — CL (ref 4.0–10.5)

## 2012-09-28 MED ORDER — VANCOMYCIN HCL IN DEXTROSE 1-5 GM/200ML-% IV SOLN
1000.0000 mg | Freq: Three times a day (TID) | INTRAVENOUS | Status: DC
  Administered 2012-09-28 – 2012-09-29 (×4): 1000 mg via INTRAVENOUS
  Filled 2012-09-28 (×8): qty 200

## 2012-09-28 MED ORDER — FILGRASTIM 480 MCG/1.6ML IJ SOLN
480.0000 ug | Freq: Every morning | INTRAMUSCULAR | Status: DC
  Administered 2012-09-28 – 2012-09-30 (×3): 480 ug via SUBCUTANEOUS
  Filled 2012-09-28 (×7): qty 1.6

## 2012-09-28 MED ORDER — VANCOMYCIN HCL IN DEXTROSE 1-5 GM/200ML-% IV SOLN
INTRAVENOUS | Status: AC
  Filled 2012-09-28: qty 200

## 2012-09-28 MED ORDER — POTASSIUM CHLORIDE CRYS ER 20 MEQ PO TBCR
40.0000 meq | EXTENDED_RELEASE_TABLET | Freq: Once | ORAL | Status: AC
  Administered 2012-09-28: 40 meq via ORAL
  Filled 2012-09-28: qty 2

## 2012-09-28 NOTE — Progress Notes (Signed)
     Subjective: This lady feels well and wants to go home but cultures are showing gram-positive cocci and gram-negative rods. We will await full identification. She has had low-grade fevers.           Physical Exam: Blood pressure 139/77, pulse 94, temperature 99.6 F (37.6 C), temperature source Oral, resp. rate 20, height 5\' 6"  (1.676 m), weight 90.719 kg (200 lb), SpO2 100.00%. She looks systemically well. She does not actually look toxic or septic today. She does look better. Heart sounds are present with still a resting tachycardia but not as it was yesterday. Lung fields are clear. She is alert and orientated   Investigations:  Recent Results (from the past 240 hour(s))  CULTURE, BLOOD (ROUTINE X 2)     Status: None   Collection Time    09/27/12 10:38 AM      Result Value Range Status   Specimen Description LEFT ANTECUBITAL   Final   Special Requests BOTTLES DRAWN AEROBIC AND ANAEROBIC  8  CC  EACH   Final   Culture     Final   Value: GRAM POSITIVE COCCI GRAM NEGATIVE RODS Gram Stain Report Called to,Read Back By and Verified With:  LANDRA AT 0031 ON 09/28/12 BY Wynonia Lawman   Report Status PENDING   Incomplete     Basic Metabolic Panel:  Recent Labs  40/98/11 1035 09/28/12 0630  NA 133* 136  K 3.2* 3.3*  CL 94* 98  CO2 29 28  GLUCOSE 110* 133*  BUN 25* 18  CREATININE 0.70 0.64  CALCIUM 9.3 8.7   Liver Function Tests:  Recent Labs  09/28/12 0630  AST 7  ALT 13  ALKPHOS 41  BILITOT 0.5  PROT 5.7*  ALBUMIN 3.0*     CBC:  Recent Labs  09/27/12 1035 09/28/12 0630  WBC 0.1* 0.1*  NEUTROABS 0.0*  --   HGB 10.4* 8.7*  HCT 28.8* 23.7*  MCV 87.8 85.9  PLT 42* 31*    Dg Chest Portable 1 View  09/27/2012  *RADIOLOGY REPORT*  Clinical Data: Fever and fatigue  PORTABLE CHEST - 1 VIEW  Comparison: 03/26/2012  Findings: There is a left chest wall porta-catheter with tip in the projection the SVC.  Normal heart size.  No pleural effusion or edema.   Interval decrease in size of anterior mediastinal mass compared with previous exam.  No airspace consolidation, pleural effusion or interstitial edema.  IMPRESSION:  1.  No acute cardiopulmonary abnormalities.   Original Report Authenticated By: Signa Kell, M.D.       Medications: I have reviewed the patient's current medications.  Impression: 1. Neutropenia with bacteremia. Gram-positive cocci and gram-negative rods identified so far. 2. Hodgkin's disease for which she is undergoing chemotherapy. 3. Hypertension. 4. Worsening anemia with a hemoglobin today of 8.7. 5. Hypokalemia.    Plan: 1. Decrease IV fluids to see if the hemoglobin was dilutional. 2. Continue with intravenous antibiotics. Vancomycin has been added to her [place]. 3. Replete potassium. 4. Continue to monitor hematological indices.    LOS: 1 day   Wilson Singer Pager [phone]  09/28/2012, 9:30 AM

## 2012-09-28 NOTE — Progress Notes (Signed)
ANTIBIOTIC CONSULT NOTE - INITIAL  Pharmacy Consult for vancomycin Indication: FUO in neutropenic pt  Allergies  Allergen Reactions  . Sulfur Anaphylaxis    Patient Measurements: Height: 5\' 6"  (167.6 cm) Weight: 200 lb (90.719 kg) IBW/kg (Calculated) : 59.3 Adjusted Body Weight: 70 kg  Vital Signs: Temp: 99 F (37.2 C) (04/18 2100) Temp src: Oral (04/18 2100) BP: 115/66 mmHg (04/18 2100) Pulse Rate: 103 (04/18 2100) Intake/Output from previous day: 04/18 0701 - 04/19 0700 In: 240 [P.O.:240] Out: -  Intake/Output from this shift:    Labs:  Recent Labs  09/25/12 0908 09/27/12 1035  WBC 0.3* 0.1*  HGB 10.9* 10.4*  PLT 107* 42*  CREATININE 0.69 0.70   Estimated Creatinine Clearance: 102.9 ml/min (by C-G formula based on Cr of 0.7).   Microbiology: Recent Results (from the past 720 hour(s))  CULTURE, BLOOD (ROUTINE X 2)     Status: None   Collection Time    09/27/12 10:38 AM      Result Value Range Status   Specimen Description LEFT ANTECUBITAL   Final   Special Requests BOTTLES DRAWN AEROBIC AND ANAEROBIC  8  CC  EACH   Final   Culture     Final   Value: GRAM POSITIVE COCCI GRAM NEGATIVE RODS Gram Stain Report Called to,Read Back By and Verified With:  LANDRA AT 0031 ON 09/28/12 BY Wynonia Lawman   Report Status PENDING   Incomplete    Medical History: Past Medical History  Diagnosis Date  . Fibromyalgia   . Eczema   . Depression     parents died close together "was hard"  . Restless leg   . Heart murmur     Told 'nothing to worry about"  No 2D Echo  . Sleep apnea     Sleep Study- 1996- "little apnea- no tx"  . Headache     Birth control pills help  . Hemorrhoid   . Anemia     As a child and while pregnant  . 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 Hypertension     recently dx with hodgkins  . Dyspnea     all the time .".due to the mass"    Medications:  Scheduled:  . acyclovir  400 mg Oral BID  . cefTAZidime  (FORTAZ)  IV  2 g Intravenous Q8H  . cholecalciferol  1,000 Units Oral Daily  . famotidine  20 mg Oral BID  . filgrastim (NEUPOGEN)  SQ  480 mcg Subcutaneous q1800  . gabapentin  600 mg Oral QHS  . lisinopril  20 mg Oral Daily   And  . hydrochlorothiazide  12.5 mg Oral Daily  . methocarbamol  500 mg Oral BID  . norethindrone-ethinyl estradiol  1 tablet Oral Daily  . potassium chloride  10 mEq Oral Daily  . prednisoLONE acetate  1 drop Both Eyes TID  . predniSONE  80 mg Oral Daily  . senna  1 tablet Oral QHS  . tiZANidine  4-6 mg Oral QHS  . [DISCONTINUED] acyclovir  400 mg Oral BID  . [DISCONTINUED] filgrastim (NEUPOGEN)  SQ  480 mcg Subcutaneous q1800  . [DISCONTINUED] KRILL OIL  2 capsule Oral Daily  . [DISCONTINUED] lisinopril-hydrochlorothiazide  1 tablet Oral Daily   Infusions:  . sodium chloride 125 mL/hr at 09/27/12 1951   PRN: acetaminophen, ALPRAZolam, chlorpheniramine-HYDROcodone, LORazepam, ondansetron, temazepam, [DISCONTINUED] ondansetron (ZOFRAN) IV, [DISCONTINUED] ondansetron  Assessment: Neutropenic pt with blood culture positive for gram  neg rods and gram pos cocci already on Fortaz 2gm IV q8h.  Now will add aggressive vancomycin regimen  Goal of Therapy:  Desire trough vancomycin 15-20mcg/ml  Plan:  1.  Vancomycin 1gm IV q8h 2.  Check steady state trough level, monitor renal function 3.  Monitor fever, WBC, culture results for infection  Scarlett Presto 09/28/2012,1:19 AM

## 2012-09-28 NOTE — Progress Notes (Signed)
CRITICAL VALUE ALERT  Critical value received: Blood Cultures-gram positive cocci, gram negative rods  Date of notification:  09/28/12  Time of notification:  0031  Critical value read back:yes  Nurse who received alert:  Westley Foots  MD notified (1st page):  Orvan Falconer  Time of first page:  0100  MD notified (2nd page):  Time of second page:  Responding MD:  Orvan Falconer  Time MD responded:  [phone]

## 2012-09-29 ENCOUNTER — Ambulatory Visit (HOSPITAL_COMMUNITY): Payer: BC Managed Care – PPO

## 2012-09-29 DIAGNOSIS — R5081 Fever presenting with conditions classified elsewhere: Secondary | ICD-10-CM

## 2012-09-29 LAB — CULTURE, BLOOD (ROUTINE X 2)

## 2012-09-29 LAB — CBC
HCT: 24.1 % — ABNORMAL LOW (ref 36.0–46.0)
Hemoglobin: 8.7 g/dL — ABNORMAL LOW (ref 12.0–15.0)
MCH: 31.2 pg (ref 26.0–34.0)
MCHC: 36.1 g/dL — ABNORMAL HIGH (ref 30.0–36.0)
MCV: 86.4 fL (ref 78.0–100.0)
Platelets: 17 10*3/uL — CL (ref 150–400)
RBC: 2.79 MIL/uL — ABNORMAL LOW (ref 3.87–5.11)
RDW: 12.4 % (ref 11.5–15.5)
WBC: 0.2 10*3/uL — CL (ref 4.0–10.5)

## 2012-09-29 LAB — BASIC METABOLIC PANEL
BUN: 18 mg/dL (ref 6–23)
CO2: 28 mEq/L (ref 19–32)
Calcium: 9 mg/dL (ref 8.4–10.5)
Chloride: 98 mEq/L (ref 96–112)
Creatinine, Ser: 0.65 mg/dL (ref 0.50–1.10)
GFR calc Af Amer: >90 mL/min (ref >90)
GFR calc non Af Amer: >90 mL/min (ref >90)
Glucose, Bld: 107 mg/dL — ABNORMAL HIGH (ref 70–99)
Potassium: 3.7 mEq/L (ref 3.5–5.1)
Sodium: 134 mEq/L — ABNORMAL LOW (ref 135–145)

## 2012-09-29 LAB — URINE CULTURE

## 2012-09-29 LAB — VANCOMYCIN, TROUGH: Vancomycin Tr: 11.5 ug/mL (ref 10.0–20.0)

## 2012-09-29 NOTE — Progress Notes (Signed)
Subjective: This lady feels much improved today. She does not have any specific complaints apart from incomplete bladder emptying. We discussed the use of vincristine, which could be a possible culprit. Blood cultures are positive, as outlined below.           Physical Exam: Blood pressure 123/72, pulse 90, temperature 97.7 F (36.5 C), temperature source Oral, resp. rate 20, height 5\' 6"  (1.676 m), weight 90.719 kg (200 lb), SpO2 100.00%. She looks systemically well. She does not  look toxic or septic today. She does look better. Heart sounds are present now without a resting tachycardia. Lung fields are clear. She is alert and orientated   Investigations:  Recent Results (from the past 240 hour(s))  CULTURE, BLOOD (ROUTINE X 2)     Status: None   Collection Time    09/27/12 10:38 AM      Result Value Range Status   Specimen Description LEFT ANTECUBITAL   Final   Special Requests BOTTLES DRAWN AEROBIC AND ANAEROBIC  8  CC  EACH   Final   Culture  Setup Time 09/28/2012 20:10   Final   Culture     Final   Value: CITROBACTER YOUNGAE     Note: Gram Stain Report Called to,Read Back By and Verified With: LANDRA AT 0031 ON 09/28/12 BY Wynonia Lawman Performed at [facility]   Report Status 09/29/2012 FINAL   Final   Organism ID, Bacteria CITROBACTER YOUNGAE   Final  URINE CULTURE     Status: None   Collection Time    09/27/12 12:49 PM      Result Value Range Status   Specimen Description URINE, CLEAN CATCH   Final   Special Requests NONE   Final   Culture  Setup Time 09/27/2012 23:50   Final   Colony Count 20,OOO COLONIES/ML   Final   Culture     Final   Value: Multiple bacterial morphotypes present, none predominant. Suggest appropriate recollection if clinically indicated.   Report Status 09/29/2012 FINAL   Final     Basic Metabolic Panel:  Recent Labs  16/10/96 0630 09/29/12 0639  NA 136 134*  K 3.3* 3.7  CL 98 98  CO2 28 28  GLUCOSE 133* 107*  BUN  18 18  CREATININE 0.64 0.65  CALCIUM 8.7 9.0   Liver Function Tests:  Recent Labs  09/28/12 0630  AST 7  ALT 13  ALKPHOS 41  BILITOT 0.5  PROT 5.7*  ALBUMIN 3.0*     CBC:  Recent Labs  09/27/12 1035 09/28/12 0630 09/29/12 0639  WBC 0.1* 0.1* 0.2*  NEUTROABS 0.0*  --   --   HGB 10.4* 8.7* 8.7*  HCT 28.8* 23.7* 24.1*  MCV 87.8 85.9 86.4  PLT 42* 31* 17*    Dg Chest Portable 1 View  09/27/2012  *RADIOLOGY REPORT*  Clinical Data: Fever and fatigue  PORTABLE CHEST - 1 VIEW  Comparison: 03/26/2012  Findings: There is a left chest wall porta-catheter with tip in the projection the SVC.  Normal heart size.  No pleural effusion or edema.  Interval decrease in size of anterior mediastinal mass compared with previous exam.  No airspace consolidation, pleural effusion or interstitial edema.  IMPRESSION:  1.  No acute cardiopulmonary abnormalities.   Original Report Authenticated By: Signa Kell, M.D.       Medications: I have reviewed the patient's current medications.  Impression: 1. Neutropenia with bacteremia, improving.CITROBACTER YOUNGAE Identified and is sensitive  to [place]. 2. Hodgkin's disease for which she is undergoing chemotherapy. 3. Hypertension. 4. Stable hemoglobin of 8.7. 5. Thrombocytopenia, progressively getting worse, at 17 today. No evidence of bleeding.    Plan: 1. IV fluids to [facility]. Encourage oral fluids. 2. Discontinue IV vancomycin as we have organism responsible and sensitivities. 3. No need for platelet transfusion at the present time but consider if the platelet count drops further.    LOS: 2 days   Wilson Singer Pager [phone]  09/29/2012, 9:41 AM

## 2012-09-30 ENCOUNTER — Other Ambulatory Visit (HOSPITAL_COMMUNITY): Payer: Self-pay | Admitting: *Deleted

## 2012-09-30 ENCOUNTER — Ambulatory Visit (HOSPITAL_COMMUNITY): Payer: BC Managed Care – PPO

## 2012-09-30 ENCOUNTER — Encounter: Payer: Self-pay | Admitting: Emergency Medicine

## 2012-09-30 ENCOUNTER — Other Ambulatory Visit (HOSPITAL_COMMUNITY): Payer: Self-pay | Admitting: Oncology

## 2012-09-30 ENCOUNTER — Encounter (HOSPITAL_COMMUNITY): Payer: Self-pay | Admitting: *Deleted

## 2012-09-30 ENCOUNTER — Other Ambulatory Visit (HOSPITAL_COMMUNITY): Payer: BC Managed Care – PPO

## 2012-09-30 DIAGNOSIS — E876 Hypokalemia: Secondary | ICD-10-CM | POA: Diagnosis present

## 2012-09-30 DIAGNOSIS — D61810 Antineoplastic chemotherapy induced pancytopenia: Secondary | ICD-10-CM

## 2012-09-30 DIAGNOSIS — B9689 Other specified bacterial agents as the cause of diseases classified elsewhere: Secondary | ICD-10-CM

## 2012-09-30 DIAGNOSIS — R7881 Bacteremia: Principal | ICD-10-CM

## 2012-09-30 DIAGNOSIS — C8190 Hodgkin lymphoma, unspecified, unspecified site: Secondary | ICD-10-CM

## 2012-09-30 DIAGNOSIS — T451X5A Adverse effect of antineoplastic and immunosuppressive drugs, initial encounter: Secondary | ICD-10-CM

## 2012-09-30 DIAGNOSIS — A498 Other bacterial infections of unspecified site: Secondary | ICD-10-CM | POA: Diagnosis present

## 2012-09-30 LAB — BASIC METABOLIC PANEL
BUN: 23 mg/dL (ref 6–23)
CO2: 29 mEq/L (ref 19–32)
Calcium: 9.1 mg/dL (ref 8.4–10.5)
Chloride: 100 mEq/L (ref 96–112)
Creatinine, Ser: 0.68 mg/dL (ref 0.50–1.10)
GFR calc Af Amer: >90 mL/min (ref >90)
GFR calc non Af Amer: >90 mL/min (ref >90)
Glucose, Bld: 90 mg/dL (ref 70–99)
Potassium: 3.7 mEq/L (ref 3.5–5.1)
Sodium: 138 mEq/L (ref 135–145)

## 2012-09-30 LAB — CBC
HCT: 22.9 % — ABNORMAL LOW (ref 36.0–46.0)
Hemoglobin: 8.3 g/dL — ABNORMAL LOW (ref 12.0–15.0)
MCH: 31.3 pg (ref 26.0–34.0)
MCHC: 36.2 g/dL — ABNORMAL HIGH (ref 30.0–36.0)
MCV: 86.4 fL (ref 78.0–100.0)
Platelets: 18 10*3/uL — CL (ref 150–400)
RBC: 2.65 MIL/uL — ABNORMAL LOW (ref 3.87–5.11)
RDW: 12.5 % (ref 11.5–15.5)
WBC: 1.1 10*3/uL — CL (ref 4.0–10.5)

## 2012-09-30 MED ORDER — HEPARIN SOD (PORK) LOCK FLUSH 100 UNIT/ML IV SOLN
500.0000 [IU] | INTRAVENOUS | Status: AC | PRN
  Administered 2012-09-30: 500 [IU]
  Filled 2012-09-30: qty 5

## 2012-09-30 MED ORDER — FILGRASTIM 480 MCG/1.6ML IJ SOLN: 480.0000 ug | Freq: Once | INTRAMUSCULAR | Status: AC

## 2012-09-30 MED ORDER — LEVOFLOXACIN 750 MG PO TABS: 750.0000 mg | ORAL_TABLET | Freq: Every day | ORAL | Status: DC

## 2012-09-30 NOTE — Progress Notes (Signed)
AVS reviewed with patient.  Pt provided with prescription for antibiotic.  Pt verbalized understanding of d/c instructions.  Teach back method used.  Pt's port flushed and deaccessed.  Pt refused wheelchair.  Stated that she wanted to ambulate to main entrance for d/c.  Pt escorted by nurse to main entrance for d/c.  Pt stable at time of d/c.

## 2012-09-30 NOTE — Discharge Summary (Signed)
Physician Discharge Summary  Patient ID: [name] [name] MRN: [id number] DOB/AGE: 1971-05-25 42 y.o.  Admit date: 09/27/2012 Discharge date: 09/30/2012  Discharge Diagnoses:  Principal Problem:   Bacteremia Active Problems:   Hypertension   Depression   Hodgkin's lymphoma   Neutropenia with fever   Citrobacter infection   Antineoplastic chemotherapy induced pancytopenia   Hypokalemia     Medication List    TAKE these medications       acetaminophen 500 MG tablet  Commonly known as:  TYLENOL  Take 1,000 mg by mouth every 6 (six) hours as needed for pain.     acyclovir 400 MG tablet  Commonly known as:  ZOVIRAX  Take 400 mg by mouth 2 (two) times daily. Take throughout chemo.     ALPRAZolam 1 MG tablet  Commonly known as:  XANAX  Take 1/2 tablet to 1 tablet four times daily as needed for anxiety.     chlorpheniramine-HYDROcodone 10-8 MG/5ML Lqcr  Commonly known as:  TUSSIONEX PENNKINETIC ER  Take 5 mLs by mouth every 12 (twelve) hours as needed.     cholecalciferol 1000 UNITS tablet  Commonly known as:  VITAMIN D  Take 1,000 Units by mouth daily.     filgrastim 480 MCG/1.6ML injection  Commonly known as:  NEUPOGEN  Inject 1.6 mLs (480 mcg total) into the skin once.  Start taking on:  10/01/2012     gabapentin 600 MG tablet  Commonly known as:  NEURONTIN  Take 600 mg by mouth at bedtime.     KRILL OIL 1000 MG Caps  Take 2 capsules by mouth daily.     levofloxacin 750 MG tablet  Commonly known as:  LEVAQUIN  Take 1 tablet (750 mg total) by mouth daily.     lidocaine-prilocaine cream  Commonly known as:  EMLA  Apply topically as needed. Apply a quarter sized amount to port site 1 hour prior to chemo. Do not rub in. Cover with plastic.     lisinopril-hydrochlorothiazide 20-12.5 MG per tablet  Commonly known as:  PRINZIDE,ZESTORETIC  Take 1 tablet by mouth daily.     LORazepam 1 MG tablet  Commonly known as:  ATIVAN  Take 1 tablet by mouth at bedtime as  needed. For nausea     methocarbamol 500 MG tablet  Commonly known as:  ROBAXIN  Take 1 tablet (500 mg total) by mouth 2 (two) times daily. Refill given on 08/30/12 per Dr. Mariel Sleet.     norethindrone-ethinyl estradiol 1-20 MG-MCG tablet  Commonly known as:  JUNEL FE,GILDESS FE,LOESTRIN FE  Take 1 tablet by mouth daily.     ondansetron 8 MG tablet  Commonly known as:  ZOFRAN  Take 8 mg by mouth 3 (three) times daily as needed for nausea. Do not take until the 3rd day after chemotherapy due to the premeds (aloxi/emend) that we give you.     potassium chloride 10 MEQ tablet  Commonly known as:  K-DUR,KLOR-CON  Take 10 mEq by mouth daily.     prednisoLONE acetate 1 % ophthalmic suspension  Commonly known as:  PRED FORTE  Place 1 drop into both eyes 3 (three) times daily.     predniSONE 20 MG tablet  Commonly known as:  DELTASONE  Take 80 mg by mouth daily. Days 1-14, then off 7 days.  #48 1 refill     ranitidine 150 MG capsule  Commonly known as:  ZANTAC  Take 150 mg by mouth 2 (two) times daily.  senna 8.6 MG Tabs  Commonly known as:  SENOKOT  Take 1 tablet by mouth at bedtime.     temazepam 15 MG capsule  Commonly known as:  RESTORIL  Take 1 capsule (15 mg total) by mouth at bedtime as needed for sleep.     tiZANidine 4 MG capsule  Commonly known as:  ZANAFLEX  Take 4-6 mg by mouth at bedtime. Takes 4-6 mg dose depends on severity of spasms.             Future Appointments Provider Department Dept Phone   09/30/2012 4:30 PM [facility] Chair [facility] [phone]   10/01/2012 4:30 PM [facility] Chair 7 [facility] [phone]   10/08/2012 8:00 AM [facility] [place] [facility] [phone]   Pt should arrive15 minutes prior to scheduled appt time. Please inform patient that exam will take a minimum of 1 1/2 hours. Patient to be NPO 6 hours prior to exam  and should not take any insulin the day of exam.    10/08/2012 2:00 PM Randall An, MD [facility] [phone]   10/10/2012 7:30 AM [facility]Rad[facility] Nurse [place] [facility] RADIATION ONCOLOGY [phone]   10/10/2012 8:00 AM Lurline Hare, MD [place] [facility] RADIATION ONCOLOGY [phone]   10/10/2012 8:30 AM Ap-Cardiopul Echo Lab [place] [facility]-PULMON[facility] SERVICES [phone]   10/10/2012 9:00 AM Lurline Hare, MD [place] [facility] RADIATION ONCOLOGY [phone]   Joint Appt [facility]-Radonc Ct Sim 2 [place] [facility] RADIATION ONCOLOGY [phone]        Disposition: 01-Home or Self Care  Discharged Condition: stable  Consults: Treatment Team:  Randall An, MD  Labs:   Results for orders placed during the hospital encounter of 09/27/12 (from the past 48 hour(s))  BASIC METABOLIC PANEL     Status: Abnormal   Collection Time    09/29/12  6:39 AM      Result Value Range   Sodium 134 (*) 135 - 145 mEq/L   Potassium 3.7  3.5 - 5.1 mEq/L   Chloride 98  96 - 112 mEq/L   CO2 28  19 - 32 mEq/L   Glucose, Bld 107 (*) 70 - 99 mg/dL   BUN 18  6 - 23 mg/dL   Creatinine, Ser 5.73  0.50 - 1.10 mg/dL   Calcium 9.0  8.4 - 22.0 mg/dL   GFR calc non Af Amer >90  >90 mL/min   GFR calc Af Amer >90  >90 mL/min   Comment:            The eGFR has been calculated     using the CKD EPI equation.     This calculation has not been     validated in all clinical     situations.     eGFR's persistently     <90 mL/min signify     possible Chronic Kidney Disease.  CBC     Status: Abnormal   Collection Time    09/29/12  6:39 AM      Result Value Range   WBC 0.2 (*) 4.0 - 10.5 K/uL   Comment: WHITE COUNT CONFIRMED ON SMEAR     CRITICAL RESULT CALLED TO, READ BACK BY AND VERIFIED WITH:     ROSS, T. AT 08:42AM ON 09/29/12 BY PRUITT, C.   RBC 2.79 (*) 3.87 - 5.11 MIL/uL   Hemoglobin 8.7 (*) 12.0 - 15.0 g/dL   HCT 25.4 (*)  36.0 - 46.0 %   MCV 86.4  78.0 - 100.0 fL   MCH 31.2  26.0 - 34.0 pg   MCHC  36.1 (*) 30.0 - 36.0 g/dL   RDW 47.8  29.5 - 62.1 %   Platelets 17 (*) 150 - 400 K/uL   Comment: PLATELET COUNT CONFIRMED BY SMEAR     DELTA CHECK NOTED     CRITICAL RESULT CALLED TO, READ BACK BY AND VERIFIED WITH:     ROSS, T. AT 08:42AM ON 09/29/12 BY PRUITT, C.  VANCOMYCIN, TROUGH     Status: None   Collection Time    09/29/12  9:00 AM      Result Value Range   Vancomycin Tr 11.5  10.0 - 20.0 ug/mL  BASIC METABOLIC PANEL     Status: None   Collection Time    09/30/12  5:05 AM      Result Value Range   Sodium 138  135 - 145 mEq/L   Potassium 3.7  3.5 - 5.1 mEq/L   Chloride 100  96 - 112 mEq/L   CO2 29  19 - 32 mEq/L   Glucose, Bld 90  70 - 99 mg/dL   BUN 23  6 - 23 mg/dL   Creatinine, Ser 3.08  0.50 - 1.10 mg/dL   Calcium 9.1  8.4 - 65.7 mg/dL   GFR calc non Af Amer >90  >90 mL/min   GFR calc Af Amer >90  >90 mL/min   Comment:            The eGFR has been calculated     using the CKD EPI equation.     This calculation has not been     validated in all clinical     situations.     eGFR's persistently     <90 mL/min signify     possible Chronic Kidney Disease.  CBC     Status: Abnormal   Collection Time    09/30/12  5:05 AM      Result Value Range   WBC 1.1 (*) 4.0 - 10.5 K/uL   Comment: RESULT REPEATED AND VERIFIED     WHITE COUNT CONFIRMED ON SMEAR     CRITICAL VALUE NOTED.  VALUE IS CONSISTENT WITH PREVIOUSLY REPORTED AND CALLED VALUE.   RBC 2.65 (*) 3.87 - 5.11 MIL/uL   Hemoglobin 8.3 (*) 12.0 - 15.0 g/dL   HCT 84.6 (*) 96.2 - 95.2 %   MCV 86.4  78.0 - 100.0 fL   MCH 31.3  26.0 - 34.0 pg   MCHC 36.2 (*) 30.0 - 36.0 g/dL   RDW 84.1  32.4 - 40.1 %   Platelets 18 (*) 150 - 400 K/uL   Comment: RESULT REPEATED AND VERIFIED     PLATELET COUNT CONFIRMED BY SMEAR     CRITICAL VALUE NOTED.  VALUE IS CONSISTENT WITH PREVIOUSLY REPORTED AND CALLED VALUE.    Diagnostics:  Dg Chest Portable 1 View  09/27/2012  *RADIOLOGY REPORT*  Clinical Data: Fever and fatigue   PORTABLE CHEST - 1 VIEW  Comparison: 03/26/2012  Findings: There is a left chest wall porta-catheter with tip in the projection the SVC.  Normal heart size.  No pleural effusion or edema.  Interval decrease in size of anterior mediastinal mass compared with previous exam.  No airspace consolidation, pleural effusion or interstitial edema.  IMPRESSION:  1.  No acute cardiopulmonary abnormalities.   Original Report Authenticated By: Signa Kell, M.D.    Full  Code   Hospital Course:  See H&P for complete admission details.  The patient is a 42 year old female with Hodgkins lymphoma undergoing chemotherapy.  She was admitted with fever.  Started on broad spectrum antibiotics.  Blood cultures grew out Citrobacter.  Symptoms and vitals improved.  Discussed the case with ID.  Repeat cultures have been negative.  ID recommends outpatient levofloxacin 750 mg daily.  Total time greater than 30 minutes.  Discharge Exam:  Blood pressure 115/66, pulse 102, temperature 99 F (37.2 C), temperature source Oral, resp. rate 16, height 5\' 6"  (1.676 m), weight 90.719 kg (200 lb), SpO2 96.00%.  Gen:  Nontoxic.  Alert and oriented. CV RRR without MGR. Lungs CTA without WRR EXT no CCE  Signed: Caelan Branden L 09/30/2012, 10:51 AM

## 2012-10-01 ENCOUNTER — Other Ambulatory Visit (HOSPITAL_COMMUNITY): Payer: Self-pay | Admitting: Oncology

## 2012-10-01 ENCOUNTER — Encounter (HOSPITAL_BASED_OUTPATIENT_CLINIC_OR_DEPARTMENT_OTHER): Payer: BC Managed Care – PPO

## 2012-10-01 ENCOUNTER — Other Ambulatory Visit (HOSPITAL_COMMUNITY): Payer: BC Managed Care – PPO

## 2012-10-01 ENCOUNTER — Other Ambulatory Visit: Payer: Self-pay

## 2012-10-01 ENCOUNTER — Ambulatory Visit (HOSPITAL_COMMUNITY): Payer: BC Managed Care – PPO

## 2012-10-01 VITALS — BP 131/79 | HR 119 | Temp 98.1°F | Resp 20

## 2012-10-01 DIAGNOSIS — C8119 Nodular sclerosis classical Hodgkin lymphoma, extranodal and solid organ sites: Secondary | ICD-10-CM

## 2012-10-01 DIAGNOSIS — C8190 Hodgkin lymphoma, unspecified, unspecified site: Secondary | ICD-10-CM

## 2012-10-01 DIAGNOSIS — Z5189 Encounter for other specified aftercare: Secondary | ICD-10-CM

## 2012-10-01 LAB — CBC WITH DIFFERENTIAL/PLATELET
Basophils Absolute: 0.1 10*3/uL (ref 0.0–0.1)
Basophils Relative: 1 % (ref 0–1)
Eosinophils Absolute: 0 10*3/uL (ref 0.0–0.7)
Eosinophils Relative: 0 % (ref 0–5)
HCT: 25.1 % — ABNORMAL LOW (ref 36.0–46.0)
Hemoglobin: 9.1 g/dL — ABNORMAL LOW (ref 12.0–15.0)
Lymphocytes Relative: 3 % — ABNORMAL LOW (ref 12–46)
Lymphs Abs: 0.2 10*3/uL — ABNORMAL LOW (ref 0.7–4.0)
MCH: 31.5 pg (ref 26.0–34.0)
MCHC: 36.3 g/dL — ABNORMAL HIGH (ref 30.0–36.0)
MCV: 86.9 fL (ref 78.0–100.0)
Monocytes Absolute: 1.1 10*3/uL — ABNORMAL HIGH (ref 0.1–1.0)
Monocytes Relative: 12 % (ref 3–12)
Neutro Abs: 7.8 10*3/uL — ABNORMAL HIGH (ref 1.7–7.7)
Neutrophils Relative %: 85 % — ABNORMAL HIGH (ref 43–77)
Platelets: 45 10*3/uL — ABNORMAL LOW (ref 150–400)
RBC: 2.89 MIL/uL — ABNORMAL LOW (ref 3.87–5.11)
RDW: 12.7 % (ref 11.5–15.5)
WBC Morphology: INCREASED
WBC: 9.2 10*3/uL (ref 4.0–10.5)

## 2012-10-01 LAB — PREPARE RBC (CROSSMATCH)

## 2012-10-01 MED ORDER — FILGRASTIM 480 MCG/0.8ML IJ SOLN
480.0000 ug | Freq: Once | INTRAMUSCULAR | Status: DC
  Administered 2012-10-01: 480 ug via SUBCUTANEOUS

## 2012-10-01 MED ORDER — HEPARIN SOD (PORK) LOCK FLUSH 100 UNIT/ML IV SOLN
500.0000 [IU] | Freq: Once | INTRAVENOUS | Status: AC
  Administered 2012-10-01: 500 [IU] via INTRAVENOUS
  Filled 2012-10-01: qty 5

## 2012-10-01 MED ORDER — HEPARIN SOD (PORK) LOCK FLUSH 100 UNIT/ML IV SOLN
INTRAVENOUS | Status: AC
  Filled 2012-10-01: qty 5

## 2012-10-01 MED ORDER — FILGRASTIM 480 MCG/1.6ML IJ SOLN
480.0000 ug | Freq: Once | INTRAMUSCULAR | Status: DC
  Filled 2012-10-01: qty 1.6

## 2012-10-01 MED FILL — Filgrastim Inj 480 MCG/1.6ML (300 MCG/ML): INTRAMUSCULAR | Qty: 1.6 | Status: AC

## 2012-10-01 NOTE — Progress Notes (Signed)
[name] [name] presents today for injection per MD orders. Neupogen administered SQ in right Abdomen. Administration without incident. Patient tolerated well.  [name] [name] presented for Portacath access and flush. Proper placement of portacath confirmed by CXR. Portacath located lt chest wall accessed with  H 19 needle. Good blood return present. Portacath flushed with 20ml NS and 500U/14ml Heparin and needle removed intact. Procedure without incident. Patient tolerated procedure well.   Patient will not need any more Neupogen shots past today d/t ANC being 7800. I cancelled the rest of her appointments for Neupogen shots. Patient to return tomorrow for blood. Patient doing ok other than being very tired. Port left accessed for use tomorrow.

## 2012-10-02 ENCOUNTER — Encounter (HOSPITAL_BASED_OUTPATIENT_CLINIC_OR_DEPARTMENT_OTHER): Payer: BC Managed Care – PPO

## 2012-10-02 ENCOUNTER — Ambulatory Visit (HOSPITAL_COMMUNITY): Payer: BC Managed Care – PPO

## 2012-10-02 VITALS — BP 112/60 | HR 98 | Temp 97.8°F | Resp 16 | Wt 200.6 lb

## 2012-10-02 DIAGNOSIS — C8190 Hodgkin lymphoma, unspecified, unspecified site: Secondary | ICD-10-CM

## 2012-10-02 DIAGNOSIS — D649 Anemia, unspecified: Secondary | ICD-10-CM

## 2012-10-02 MED ORDER — SODIUM CHLORIDE 0.9 % IJ SOLN
10.0000 mL | INTRAMUSCULAR | Status: AC | PRN
  Administered 2012-10-02: 10 mL
  Filled 2012-10-02: qty 10

## 2012-10-02 MED ORDER — HEPARIN SOD (PORK) LOCK FLUSH 100 UNIT/ML IV SOLN
500.0000 [IU] | Freq: Every day | INTRAVENOUS | Status: AC | PRN
  Administered 2012-10-02: 500 [IU]
  Filled 2012-10-02: qty 5

## 2012-10-02 MED ORDER — HEPARIN SOD (PORK) LOCK FLUSH 100 UNIT/ML IV SOLN
INTRAVENOUS | Status: AC
  Filled 2012-10-02: qty 5

## 2012-10-02 NOTE — Progress Notes (Signed)
Tolerated transfusion well. 

## 2012-10-03 ENCOUNTER — Encounter (HOSPITAL_BASED_OUTPATIENT_CLINIC_OR_DEPARTMENT_OTHER): Payer: BC Managed Care – PPO

## 2012-10-03 ENCOUNTER — Ambulatory Visit (HOSPITAL_COMMUNITY): Payer: BC Managed Care – PPO

## 2012-10-03 ENCOUNTER — Other Ambulatory Visit (HOSPITAL_COMMUNITY): Payer: BC Managed Care – PPO

## 2012-10-03 DIAGNOSIS — C8119 Nodular sclerosis classical Hodgkin lymphoma, extranodal and solid organ sites: Secondary | ICD-10-CM

## 2012-10-03 DIAGNOSIS — C8190 Hodgkin lymphoma, unspecified, unspecified site: Secondary | ICD-10-CM

## 2012-10-03 DIAGNOSIS — Z5189 Encounter for other specified aftercare: Secondary | ICD-10-CM

## 2012-10-03 LAB — TYPE AND SCREEN
ABO/RH(D): A POS
Antibody Screen: NEGATIVE
Unit division: 0
Unit division: 0

## 2012-10-03 LAB — BASIC METABOLIC PANEL
BUN: 23 mg/dL (ref 6–23)
CO2: 29 mEq/L (ref 19–32)
Calcium: 9.8 mg/dL (ref 8.4–10.5)
Chloride: 95 mEq/L — ABNORMAL LOW (ref 96–112)
Creatinine, Ser: 0.84 mg/dL (ref 0.50–1.10)
GFR calc Af Amer: >90 mL/min (ref >90)
GFR calc non Af Amer: 84 mL/min — ABNORMAL LOW (ref >90)
Glucose, Bld: 99 mg/dL (ref 70–99)
Potassium: 4.2 mEq/L (ref 3.5–5.1)
Sodium: 135 mEq/L (ref 135–145)

## 2012-10-03 MED ORDER — HEPARIN SOD (PORK) LOCK FLUSH 100 UNIT/ML IV SOLN
500.0000 [IU] | Freq: Once | INTRAVENOUS | Status: AC
  Administered 2012-10-03: 500 [IU] via INTRAVENOUS
  Filled 2012-10-03: qty 5

## 2012-10-03 MED ORDER — SODIUM CHLORIDE 0.9 % IJ SOLN
10.0000 mL | INTRAMUSCULAR | Status: AC | PRN
  Administered 2012-10-03: 10 mL via INTRAVENOUS
  Filled 2012-10-03: qty 10

## 2012-10-03 MED ORDER — HEPARIN SOD (PORK) LOCK FLUSH 100 UNIT/ML IV SOLN
INTRAVENOUS | Status: AC
  Filled 2012-10-03: qty 5

## 2012-10-03 NOTE — Progress Notes (Unsigned)
Holland Falling presented for Portacath access and flush.  Proper placement of portacath confirmed by CXR.  Portacath located left chest wall accessed with  H 20 needle.  Good blood return present. Portacath flushed with 20ml NS and 500U/60ml Heparin and needle removed intact.  Procedure tolerated well and without incident.

## 2012-10-04 ENCOUNTER — Ambulatory Visit (HOSPITAL_COMMUNITY): Payer: BC Managed Care – PPO

## 2012-10-05 ENCOUNTER — Ambulatory Visit (HOSPITAL_COMMUNITY): Payer: BC Managed Care – PPO

## 2012-10-05 LAB — CULTURE, BLOOD (ROUTINE X 2)
Culture: NO GROWTH
Culture: NO GROWTH

## 2012-10-06 ENCOUNTER — Ambulatory Visit (HOSPITAL_COMMUNITY): Payer: BC Managed Care – PPO

## 2012-10-07 ENCOUNTER — Ambulatory Visit (HOSPITAL_COMMUNITY): Payer: BC Managed Care – PPO

## 2012-10-08 ENCOUNTER — Ambulatory Visit (HOSPITAL_COMMUNITY): Payer: BC Managed Care – PPO

## 2012-10-08 ENCOUNTER — Encounter: Payer: Self-pay | Admitting: Radiation Oncology

## 2012-10-08 ENCOUNTER — Encounter (HOSPITAL_BASED_OUTPATIENT_CLINIC_OR_DEPARTMENT_OTHER): Payer: BC Managed Care – PPO | Admitting: Oncology

## 2012-10-08 ENCOUNTER — Encounter (HOSPITAL_COMMUNITY)
Admission: RE | Admit: 2012-10-08 | Discharge: 2012-10-08 | Disposition: A | Discharge status: Home / Self Care | Payer: BC Managed Care – PPO | Source: Ambulatory Visit | Attending: Oncology | Admitting: Oncology

## 2012-10-08 VITALS — BP 131/78 | HR 110 | Temp 98.5°F | Resp 20 | Wt 203.8 lb

## 2012-10-08 DIAGNOSIS — C8190 Hodgkin lymphoma, unspecified, unspecified site: Secondary | ICD-10-CM

## 2012-10-08 DIAGNOSIS — C8119 Nodular sclerosis classical Hodgkin lymphoma, extranodal and solid organ sites: Secondary | ICD-10-CM

## 2012-10-08 LAB — GLUCOSE, CAPILLARY: Glucose-Capillary: 103 mg/dL — ABNORMAL HIGH (ref 70–99)

## 2012-10-08 MED ORDER — FLUDEOXYGLUCOSE F - 18 (FDG) INJECTION
18.1000 | Freq: Once | INTRAVENOUS | Status: AC | PRN
  Administered 2012-10-08: 18.1 via INTRAVENOUS

## 2012-10-08 NOTE — Progress Notes (Signed)
Problem #1 stage II A., bulky mediastinal nodular sclerosing Hodgkin's disease presenting with a large anterior mediastinal mass as well as left cervical and right cervical lymph nodes status post 5 cycles of ABVD but with incomplete response after cycles 3 and after cycles 5. She was then switched to dose adjusted BEACOPP x2 cycles. She has just had her PET scan this morning which shows complete resolution of activity in the mediastinal mass. She has minimal activity in the lung bases bilaterally felt to be infectious which is a leftover from her Citrobacter bloodstream and pulmonary infection. She will finish her Levaquin in 5 more days. She will have one more pentamidine inhalation treatment in May. We will have her port removed prior radiation the week of May 12. I want a chest x-ray in followup on 10/18/2012 as well as blood work to make sure she is rated to start radiation. I've spoken with Dr. Michell Heinrich today who will see her this coming Thursday in followup and get started on planning.  Her lungs today are very clear in spite of the findings. I do want dose to be absolutely clear on the chest x-ray.  She will therefore have involved field radiation therapy followed by a followup PET scan probably in 6 months. I will see her in a few weeks. She therefore has a CR by our criteria.  We did talk about her future which includes weight reduction with the hepatosplenomegaly with fatty infiltration of the liver, getting back to exercise which will help with the ideal way to achievement,, colonoscopy at age 81, lipid control, regular mammography etc. She will of course be at risk for secondary malignancy just because of her Hodgkin's lymphoma.

## 2012-10-08 NOTE — Progress Notes (Signed)
Per Dr. Mariel Sleet phone conversation today, pt in CR per PET but hospitalized with sepsis after BEACOPP. Plan definitive RT for Stage IIA disease and follow. Transplant only if recurrence. Start tx in a few weeks to give her a chance to recover from recent hospitalization.

## 2012-10-08 NOTE — Patient Instructions (Addendum)
[facility] Discharge Instructions  RECOMMENDATIONS MADE BY THE CONSULTANT AND ANY TEST RESULTS WILL BE SENT TO YOUR REFERRING PHYSICIAN.  EXAM FINDINGS BY THE PHYSICIAN TODAY AND SIGNS OR SYMPTOMS TO REPORT TO CLINIC OR PRIMARY PHYSICIAN: Exam findings as discussed by Dr. Mariel Sleet.  SPECIAL INSTRUCTIONS/FOLLOW-UP: 1.  Continue taking your Levaquin to completion - do not stop taking until your prescription is finished. 2.  Please return to the Cancer Center on 10/28/12 to receive Pentamadine aerosol by our Respiratory Therapy Department. 3.  You will need to continue taking Acyclovir for the next 6 weeks. 4.  Return in 2 weeks for a repeat chest xray and labs - appt time to follow.  You can go to radiology after labs that day. 5.  We will have you set-up to see radiation oncology in 3-4 weeks, which will be after your port has been removed. 6.  We will have Dr. Sunday Corn office contact you about a time to have your port removed around 10/21/12 (after you complete your antibiotics). 7.  Dr. Mariel Sleet will see you again after your labs have resulted - appt date/time to be scheduled. 8.  Please report any new lumps/bumps, unexplained sweats - have all your health screenings (colonoscopies, mammography, pelvic exams, lab panels, etc.).   Thank you for choosing [name] to provide your oncology and hematology care.  To afford each patient quality time with our providers, please arrive at least 15 minutes before your scheduled appointment time.  With your help, our goal is to use those 15 minutes to complete the necessary work-up to ensure our physicians have the information they need to help with your evaluation and healthcare recommendations.    Effective January 1st, 2014, we ask that you re-schedule your appointment with our physicians should you arrive 10 or more minutes late for your appointment.  We strive to give you quality time with our providers, and  arriving late affects you and other patients whose appointments are after yours.    Again, thank you for choosing [facility].  Our hope is that these requests will decrease the amount of time that you wait before being seen by our physicians.       _____________________________________________________________  Should you have questions after your visit to [facility], please contact our office at [phone] between the hours of 8:30 a.m. and 5:00 p.m.  Voicemails left after 4:30 p.m. will not be returned until the following business day.  For prescription refill requests, have your pharmacy contact our office with your prescription refill request.

## 2012-10-09 ENCOUNTER — Encounter: Payer: Self-pay | Admitting: *Deleted

## 2012-10-09 ENCOUNTER — Other Ambulatory Visit (HOSPITAL_COMMUNITY): Payer: Self-pay | Admitting: Oncology

## 2012-10-09 ENCOUNTER — Encounter (HOSPITAL_COMMUNITY): Payer: Self-pay | Admitting: Pharmacy Technician

## 2012-10-09 ENCOUNTER — Other Ambulatory Visit: Payer: Self-pay | Admitting: *Deleted

## 2012-10-09 DIAGNOSIS — C8190 Hodgkin lymphoma, unspecified, unspecified site: Secondary | ICD-10-CM

## 2012-10-09 DIAGNOSIS — M62838 Other muscle spasm: Secondary | ICD-10-CM

## 2012-10-09 MED ORDER — ACYCLOVIR 400 MG PO TABS: 400.0000 mg | ORAL_TABLET | Freq: Two times a day (BID) | ORAL | Status: DC

## 2012-10-09 MED ORDER — TIZANIDINE HCL 4 MG PO CAPS: 4.0000 mg | ORAL_CAPSULE | Freq: Three times a day (TID) | ORAL | Status: DC

## 2012-10-09 NOTE — Progress Notes (Signed)
Thoracic Location of Tumor / Histology: mediastinal mass, Classical Hodgkin's Lymphoma  Patient presented 7 months ago with symptoms of: chest discomfort, SOB w/exertion, 1st appeared 2010, dysphagia later  Biopsies of mediastinum (if applicable) revealed: classical Hodgkin's Lymphoma, 03/05/12  Tobacco/Marijuana/Snuff/ETOH use: never  Past/Anticipated interventions by cardiothoracic surgery, if any: 03/05/12 left anterior mediastintomy  Past/Anticipated interventions by medical oncology, if any: s/p 5 cycles ABVD, switched to BEACOPP x 2 cycles, last dose 09/25/12  Signs/Symptoms  Weight changes, if any: none  Respiratory complaints, if any: SOB w/exertion, occasional dry cough  Hemoptysis, if any: none  Pain issues, if any:  Muscle aches  SAFETY ISSUES:  Prior radiation? no  Pacemaker/ICD? no  Possible current pregnancy?   Is the patient on methotrexate? no  Current Complaints / other details:  Muscle aches, especially in legs, from chemo, "some fluid in my lungs, SOB w/exertion, dry cough but not bad", denies loss of appetite, working full time at [employer], hospitalized over [place] for "3 infections", low WBC

## 2012-10-10 ENCOUNTER — Ambulatory Visit
Admit: 2012-10-10 | Discharge: 2012-10-10 | Disposition: A | Discharge status: Home / Self Care | Payer: BC Managed Care – PPO | Attending: Radiation Oncology | Admitting: Radiation Oncology

## 2012-10-10 ENCOUNTER — Encounter: Payer: Self-pay | Admitting: Radiation Oncology

## 2012-10-10 ENCOUNTER — Ambulatory Visit: Payer: BC Managed Care – PPO | Admitting: Radiation Oncology

## 2012-10-10 ENCOUNTER — Ambulatory Visit (HOSPITAL_COMMUNITY)
Admission: RE | Admit: 2012-10-10 | Discharge: 2012-10-10 | Disposition: A | Discharge status: Home / Self Care | Payer: BC Managed Care – PPO | Source: Ambulatory Visit | Attending: Oncology | Admitting: Oncology

## 2012-10-10 ENCOUNTER — Other Ambulatory Visit (HOSPITAL_COMMUNITY): Payer: BC Managed Care – PPO

## 2012-10-10 VITALS — BP 109/73 | HR 134 | Temp 98.7°F | Resp 20 | Wt 206.1 lb

## 2012-10-10 DIAGNOSIS — I10 Essential (primary) hypertension: Secondary | ICD-10-CM | POA: Insufficient documentation

## 2012-10-10 DIAGNOSIS — IMO0001 Reserved for inherently not codable concepts without codable children: Secondary | ICD-10-CM | POA: Insufficient documentation

## 2012-10-10 DIAGNOSIS — Z9221 Personal history of antineoplastic chemotherapy: Secondary | ICD-10-CM | POA: Insufficient documentation

## 2012-10-10 DIAGNOSIS — C8190 Hodgkin lymphoma, unspecified, unspecified site: Secondary | ICD-10-CM | POA: Insufficient documentation

## 2012-10-10 DIAGNOSIS — R0602 Shortness of breath: Secondary | ICD-10-CM | POA: Insufficient documentation

## 2012-10-10 DIAGNOSIS — Z09 Encounter for follow-up examination after completed treatment for conditions other than malignant neoplasm: Secondary | ICD-10-CM

## 2012-10-10 DIAGNOSIS — R0609 Other forms of dyspnea: Secondary | ICD-10-CM | POA: Insufficient documentation

## 2012-10-10 DIAGNOSIS — R5381 Other malaise: Secondary | ICD-10-CM | POA: Insufficient documentation

## 2012-10-10 DIAGNOSIS — R0989 Other specified symptoms and signs involving the circulatory and respiratory systems: Secondary | ICD-10-CM | POA: Insufficient documentation

## 2012-10-10 NOTE — Progress Notes (Signed)
Please see the Nurse Progress Note in the MD Initial Consult Encounter for this patient. 

## 2012-10-10 NOTE — Progress Notes (Signed)
*  PRELIMINARY RESULTS* Echocardiogram 2D Echocardiogram has been performed.  [name] [name] 10/10/2012, 2:25 PM

## 2012-10-10 NOTE — Progress Notes (Signed)
Department of Radiation Oncology  Phone:  [phone] Fax:        [phone]   Name: [name] [name] MRN: [id number]  DOB: 1971/02/20  Date: 10/10/2012  Follow Up Visit Note  Diagnosis: Stage II Hodgkins disease  Interval History: [name] presents today for routine followup.  She completed BX copy chemotherapy on April 16. This was complicated by sepsis for which she was hospitalized. She is recovering from this. She had a PET scan on April 29 which noted the anterior mediastinal mass was slightly smaller measuring 6.2 x 2.5 cm versus 6.7 x 3.3 cm it was no longer hypermetabolic. No other hypermetabolic lymph nodes were noted. There was some groundglass abnormalities consistent with inflammatory or infectious etiology. She did have an imaging incomplete response after 5 cycles of ABVD. She is scheduled to have her port removed on May 7. She would like to proceed with radiation as soon as possible. She continues to work. She has shortness of breath with exertion muscle aches and overall fatigue.  Allergies:  Allergies  Allergen Reactions  . Other Other (See Comments)    Patient can not have straight oxygen due to reaction with chemo medications.   . Sulfur Anaphylaxis    Medications:  Current Outpatient Prescriptions  Medication Sig Dispense Refill  . acetaminophen (TYLENOL) 500 MG tablet Take 1,000 mg by mouth every 6 (six) hours as needed for pain.      Marland Kitchen acyclovir (ZOVIRAX) 400 MG tablet Take 1 tablet (400 mg total) by mouth 2 (two) times daily. Take throughout chemo.Take 400 mg by mouth 2 (two) times daily. Take throughout chemo.  90 tablet  0  . ALPRAZolam (XANAX) 1 MG tablet Take 0.5 mg by mouth 4 (four) times daily as needed for anxiety.      . chlorpheniramine-HYDROcodone (TUSSIONEX) 10-8 MG/5ML LQCR Take 5 mLs by mouth every 12 (twelve) hours as needed. cough      . cholecalciferol (VITAMIN D) 1000 UNITS tablet Take 1,000 Units by mouth daily.      Marland Kitchen gabapentin  (NEURONTIN) 600 MG tablet Take 600 mg by mouth at bedtime.       Marland Kitchen KRILL OIL 1000 MG CAPS Take 1 capsule by mouth daily.       Marland Kitchen levofloxacin (LEVAQUIN) 750 MG tablet       . lisinopril-hydrochlorothiazide (PRINZIDE,ZESTORETIC) 20-12.5 MG per tablet Take 1 tablet by mouth daily.      Marland Kitchen LORazepam (ATIVAN) 1 MG tablet Take 1 tablet by mouth at bedtime as needed. For nausea      . methocarbamol (ROBAXIN) 500 MG tablet Take 1 tablet (500 mg total) by mouth 2 (two) times daily. Refill given on 08/30/12 per Dr. Mariel Sleet.  60 tablet  0  . norethindrone-ethinyl estradiol (JUNEL FE,GILDESS FE,LOESTRIN FE) 1-20 MG-MCG tablet Take 1 tablet by mouth daily.      . potassium chloride (K-DUR,KLOR-CON) 10 MEQ tablet Take 10 mEq by mouth 2 (two) times daily.       . prednisoLONE acetate (PRED FORTE) 1 % ophthalmic suspension Place 1 drop into both eyes 3 (three) times daily.  5 mL  0  . ranitidine (ZANTAC) 150 MG capsule Take 150 mg by mouth daily.       Marland Kitchen senna (SENOKOT) 8.6 MG TABS Take 1 tablet by mouth at bedtime.      Marland Kitchen tiZANidine (ZANAFLEX) 4 MG tablet Take 4-6 mg by mouth every 6 (six) hours as needed. Muscle spasms.  No current facility-administered medications for this encounter.   Facility-Administered Medications Ordered in Other Encounters  Medication Dose Route Frequency Provider Last Rate Last Dose  . sodium chloride 0.9 % injection 10 mL  10 mL Intravenous PRN Randall An, MD   10 mL at 10/03/12 1643    Physical Exam:  Filed Vitals:   10/10/12 0740  BP: 109/73  Pulse: 134  Temp: 98.7 F (37.1 C)  Resp: 20   she is a pleasant female in no distress sitting comfortably examining table. She has no palpable cervical or supraclavicular adenopathy. She is alert minus x3.  IMPRESSION: [name]  PLAN:  [name] had a complete response but still has abnormal soft tissue at the  mediastinum. This measures over 6 cm. She would therefore benefit from consolidative radiation. She is concerned about lung and heart damage. We went over the use of IMR T. to decrease the dose to these structures. I think especially given the pulmonary symptoms she is currently having as well as the chemotherapy she had to receive it is prudent to use IM RT to avoid the structures. It will also allow [place] to decrease dose to her medial breast tissue. She does have large pendulous breasts and we would like to decrease dose is much as possible. We discussed the possible side effects of treatment including but not limited to esophagitis and skin redness. We discussed the possibility of secondary malignancies including breast cancer. She signed informed consent. I plan to simulate her on May 8. She will have her port removed on the seventh. We'll plan on starting her treatment the 19th or 20th.    Lurline Hare, MD

## 2012-10-11 NOTE — Addendum Note (Signed)
Encounter addended by: Glennie Hawk, RN on: 10/11/2012  2:05 PM<BR>     Documentation filed: Charges VN

## 2012-10-14 ENCOUNTER — Encounter (HOSPITAL_COMMUNITY): Payer: Self-pay

## 2012-10-14 ENCOUNTER — Encounter (HOSPITAL_COMMUNITY)
Admission: RE | Admit: 2012-10-14 | Discharge: 2012-10-14 | Disposition: A | Discharge status: Home / Self Care | Payer: BC Managed Care – PPO | Source: Ambulatory Visit | Attending: Thoracic Surgery (Cardiothoracic Vascular Surgery) | Admitting: Thoracic Surgery (Cardiothoracic Vascular Surgery)

## 2012-10-14 VITALS — BP 118/77 | HR 107 | Temp 98.9°F | Resp 20 | Ht 66.0 in | Wt 205.3 lb

## 2012-10-14 DIAGNOSIS — C8190 Hodgkin lymphoma, unspecified, unspecified site: Secondary | ICD-10-CM

## 2012-10-14 HISTORY — DX: Heart failure, unspecified: I50.9

## 2012-10-14 LAB — CBC
HCT: 28.7 % — ABNORMAL LOW (ref 36.0–46.0)
Hemoglobin: 10.4 g/dL — ABNORMAL LOW (ref 12.0–15.0)
MCH: 31.8 pg (ref 26.0–34.0)
MCHC: 36.2 g/dL — ABNORMAL HIGH (ref 30.0–36.0)
MCV: 87.8 fL (ref 78.0–100.0)
Platelets: 132 10*3/uL — ABNORMAL LOW (ref 150–400)
RBC: 3.27 MIL/uL — ABNORMAL LOW (ref 3.87–5.11)
RDW: 16.6 % — ABNORMAL HIGH (ref 11.5–15.5)
WBC: 4.4 10*3/uL (ref 4.0–10.5)

## 2012-10-14 LAB — BASIC METABOLIC PANEL
BUN: 14 mg/dL (ref 6–23)
CO2: 29 mEq/L (ref 19–32)
Calcium: 10.2 mg/dL (ref 8.4–10.5)
Chloride: 100 mEq/L (ref 96–112)
Creatinine, Ser: 0.78 mg/dL (ref 0.50–1.10)
GFR calc Af Amer: >90 mL/min (ref >90)
GFR calc non Af Amer: >90 mL/min (ref >90)
Glucose, Bld: 100 mg/dL — ABNORMAL HIGH (ref 70–99)
Potassium: 4.4 mEq/L (ref 3.5–5.1)
Sodium: 137 mEq/L (ref 135–145)

## 2012-10-14 LAB — SURGICAL PCR SCREEN
MRSA, PCR: NEGATIVE
Staphylococcus aureus: NEGATIVE

## 2012-10-15 NOTE — Progress Notes (Signed)
Anesthesia chart review: Patient is a 42 year old female scheduled for Port-A-Cath removal by Dr. Dorris Fetch on 10/16/12.  History includes stage II Hodgkin's Lymphoma (with mediastinal mass) s/p chemotherapy with plans for future radiation, fibromyalgia, depression, restless leg syndrome, obstructive sleep apnea, headaches, anemia, eczema, functional heart murmur, hypertension.  PCP is Dr. [name].  Echo on 10/10/12 showed: - Left ventricle: The cavity size was normal. Wall thickness was normal. Systolic function was normal. The estimated ejection fraction was in the range of 60% to 65%. Wall motion was normal; there were no regional wall motion abnormalities. - Mitral valve: Calcified annulus. - Atrial septum: No defect or patent foramen ovale was identified.  EKG on 10/01/12 showed NSR, non-specific ST/T wave changes.  2V CXR on 03/26/12 showed: Mediastinal mass/lymphadenopathy, stable since previous study. No evidence of active pulmonary disease. 1V CXR on 09/27/12 showed no acute cardiopulmonary abnormality with interval decrease in size of anterior mediastinal mass.  Preoperative labs noted.  Anticipate she can proceed as planned.  Velna Ochs [name] Short Stay Center/Anesthesiology Phone [phone] 10/15/2012 9:49 AM

## 2012-10-16 ENCOUNTER — Ambulatory Visit (HOSPITAL_COMMUNITY)
Admission: RE | Admit: 2012-10-16 | Discharge: 2012-10-16 | Disposition: A | Discharge status: Home / Self Care | Payer: BC Managed Care – PPO | Source: Ambulatory Visit | Attending: Thoracic Surgery (Cardiothoracic Vascular Surgery) | Admitting: Thoracic Surgery (Cardiothoracic Vascular Surgery)

## 2012-10-16 ENCOUNTER — Encounter (HOSPITAL_COMMUNITY): Payer: Self-pay | Admitting: Vascular Surgery

## 2012-10-16 ENCOUNTER — Other Ambulatory Visit (HOSPITAL_COMMUNITY): Payer: Self-pay

## 2012-10-16 ENCOUNTER — Encounter (HOSPITAL_COMMUNITY)
Admission: RE | Disposition: A | Discharge status: Home / Self Care | Payer: Self-pay | Source: Ambulatory Visit | Attending: Thoracic Surgery (Cardiothoracic Vascular Surgery)

## 2012-10-16 ENCOUNTER — Ambulatory Visit (HOSPITAL_COMMUNITY): Payer: BC Managed Care – PPO | Admitting: Anesthesiology

## 2012-10-16 ENCOUNTER — Encounter (HOSPITAL_COMMUNITY): Payer: Self-pay | Admitting: *Deleted

## 2012-10-16 DIAGNOSIS — L259 Unspecified contact dermatitis, unspecified cause: Secondary | ICD-10-CM | POA: Insufficient documentation

## 2012-10-16 DIAGNOSIS — IMO0001 Reserved for inherently not codable concepts without codable children: Secondary | ICD-10-CM | POA: Insufficient documentation

## 2012-10-16 DIAGNOSIS — Z79899 Other long term (current) drug therapy: Secondary | ICD-10-CM | POA: Insufficient documentation

## 2012-10-16 DIAGNOSIS — I10 Essential (primary) hypertension: Secondary | ICD-10-CM | POA: Insufficient documentation

## 2012-10-16 DIAGNOSIS — C8190 Hodgkin lymphoma, unspecified, unspecified site: Secondary | ICD-10-CM

## 2012-10-16 DIAGNOSIS — Z8701 Personal history of pneumonia (recurrent): Secondary | ICD-10-CM | POA: Insufficient documentation

## 2012-10-16 DIAGNOSIS — G4730 Sleep apnea, unspecified: Secondary | ICD-10-CM | POA: Insufficient documentation

## 2012-10-16 DIAGNOSIS — Z809 Family history of malignant neoplasm, unspecified: Secondary | ICD-10-CM | POA: Insufficient documentation

## 2012-10-16 DIAGNOSIS — G2581 Restless legs syndrome: Secondary | ICD-10-CM | POA: Insufficient documentation

## 2012-10-16 DIAGNOSIS — Z452 Encounter for adjustment and management of vascular access device: Secondary | ICD-10-CM | POA: Insufficient documentation

## 2012-10-16 DIAGNOSIS — I509 Heart failure, unspecified: Secondary | ICD-10-CM | POA: Insufficient documentation

## 2012-10-16 DIAGNOSIS — Z882 Allergy status to sulfonamides status: Secondary | ICD-10-CM | POA: Insufficient documentation

## 2012-10-16 DIAGNOSIS — C8119 Nodular sclerosis classical Hodgkin lymphoma, extranodal and solid organ sites: Secondary | ICD-10-CM | POA: Insufficient documentation

## 2012-10-16 DIAGNOSIS — Z9221 Personal history of antineoplastic chemotherapy: Secondary | ICD-10-CM | POA: Insufficient documentation

## 2012-10-16 DIAGNOSIS — R011 Cardiac murmur, unspecified: Secondary | ICD-10-CM | POA: Insufficient documentation

## 2012-10-16 DIAGNOSIS — Z801 Family history of malignant neoplasm of trachea, bronchus and lung: Secondary | ICD-10-CM | POA: Insufficient documentation

## 2012-10-16 DIAGNOSIS — Z888 Allergy status to other drugs, medicaments and biological substances status: Secondary | ICD-10-CM | POA: Insufficient documentation

## 2012-10-16 HISTORY — PX: PORT-A-CATH REMOVAL: SHX5289

## 2012-10-16 SURGERY — REMOVAL PORT-A-CATH
Anesthesia: Monitor Anesthesia Care | Site: Chest | Laterality: Left | Wound class: Clean

## 2012-10-16 MED ORDER — OXYCODONE HCL 5 MG PO TABS: 5.0000 mg | ORAL_TABLET | Freq: Once | ORAL | Status: DC | PRN

## 2012-10-16 MED ORDER — METOCLOPRAMIDE HCL 5 MG/ML IJ SOLN: 10.0000 mg | Freq: Once | INTRAMUSCULAR | Status: DC | PRN

## 2012-10-16 MED ORDER — PHENYLEPHRINE HCL 10 MG/ML IJ SOLN
INTRAMUSCULAR | Status: DC | PRN
  Administered 2012-10-16: 80 ug via INTRAVENOUS

## 2012-10-16 MED ORDER — OXYCODONE HCL 5 MG/5ML PO SOLN
5.0000 mg | Freq: Once | ORAL | Status: DC | PRN
Start: 2012-10-16 — End: 2012-10-16

## 2012-10-16 MED ORDER — ONDANSETRON HCL 4 MG/2ML IJ SOLN
INTRAMUSCULAR | Status: DC | PRN
  Administered 2012-10-16: 4 mg via INTRAVENOUS

## 2012-10-16 MED ORDER — MIDAZOLAM HCL 5 MG/5ML IJ SOLN
INTRAMUSCULAR | Status: DC | PRN
  Administered 2012-10-16: 2 mg via INTRAVENOUS

## 2012-10-16 MED ORDER — CEFUROXIME SODIUM 1.5 G IJ SOLR
INTRAMUSCULAR | Status: AC
  Administered 2012-10-16: 1.5 g via INTRAMUSCULAR
  Filled 2012-10-16: qty 1.5

## 2012-10-16 MED ORDER — LACTATED RINGERS IV SOLN
INTRAVENOUS | Status: DC | PRN
  Administered 2012-10-16: 08:00:00 via INTRAVENOUS

## 2012-10-16 MED ORDER — 0.9 % SODIUM CHLORIDE (POUR BTL) OPTIME
TOPICAL | Status: DC | PRN
  Administered 2012-10-16: 1000 mL

## 2012-10-16 MED ORDER — BUPIVACAINE HCL 0.5 % IJ SOLN
INTRAMUSCULAR | Status: DC | PRN
  Administered 2012-10-16: 5 mL

## 2012-10-16 MED ORDER — PROPOFOL 10 MG/ML IV BOLUS
INTRAVENOUS | Status: DC | PRN
  Administered 2012-10-16: 150 mg via INTRAVENOUS

## 2012-10-16 MED ORDER — FENTANYL CITRATE 0.05 MG/ML IJ SOLN
INTRAMUSCULAR | Status: DC | PRN
  Administered 2012-10-16: 25 ug via INTRAVENOUS
  Administered 2012-10-16: 50 ug via INTRAVENOUS
  Administered 2012-10-16: 25 ug via INTRAVENOUS

## 2012-10-16 MED ORDER — FENTANYL CITRATE 0.05 MG/ML IJ SOLN: 25.0000 ug | INTRAMUSCULAR | Status: DC | PRN

## 2012-10-16 MED ORDER — BUPIVACAINE HCL (PF) 0.5 % IJ SOLN
INTRAMUSCULAR | Status: AC
  Filled 2012-10-16: qty 30

## 2012-10-16 MED ORDER — DEXTROSE 5 % IV SOLN
INTRAVENOUS | Status: AC
  Filled 2012-10-16: qty 50

## 2012-10-16 SURGICAL SUPPLY — 36 items
ADH SKN CLS APL DERMABOND .7 (GAUZE/BANDAGES/DRESSINGS) ×1
BLADE SURG 11 STRL SS (BLADE) ×2 IMPLANT
CANISTER SUCTION 2500CC (MISCELLANEOUS) ×2 IMPLANT
CLOTH BEACON ORANGE TIMEOUT ST (SAFETY) ×2 IMPLANT
COVER SURGICAL LIGHT HANDLE (MISCELLANEOUS) ×4 IMPLANT
DERMABOND ADVANCED (GAUZE/BANDAGES/DRESSINGS) ×1
DERMABOND ADVANCED .7 DNX12 (GAUZE/BANDAGES/DRESSINGS) ×1 IMPLANT
DRAPE LAPAROTOMY T 102X78X121 (DRAPES) ×2 IMPLANT
ELECT REM PT RETURN 9FT ADLT (ELECTROSURGICAL) ×2
ELECTRODE REM PT RTRN 9FT ADLT (ELECTROSURGICAL) ×1 IMPLANT
GLOVE BIOGEL PI IND STRL 6.5 (GLOVE) IMPLANT
GLOVE BIOGEL PI INDICATOR 6.5 (GLOVE) ×1
GLOVE BIOGEL PI ORTHO PRO SZ7 (GLOVE) ×2
GLOVE EUDERMIC 7 POWDERFREE (GLOVE) ×2 IMPLANT
GLOVE PI ORTHO PRO STRL SZ7 (GLOVE) IMPLANT
GOWN PREVENTION PLUS XLARGE (GOWN DISPOSABLE) ×2 IMPLANT
GOWN SRG XL XLNG 56XLVL 4 (GOWN DISPOSABLE) IMPLANT
GOWN STRL NON-REIN LRG LVL3 (GOWN DISPOSABLE) ×2 IMPLANT
GOWN STRL NON-REIN XL XLG LVL4 (GOWN DISPOSABLE) ×4
KIT BASIN OR (CUSTOM PROCEDURE TRAY) ×2 IMPLANT
KIT ROOM TURNOVER OR (KITS) ×2 IMPLANT
NDL HYPO 25GX1X1/2 BEV (NEEDLE) IMPLANT
NEEDLE 22X1 1/2 (OR ONLY) (NEEDLE) ×2 IMPLANT
NEEDLE HYPO 25GX1X1/2 BEV (NEEDLE) ×2 IMPLANT
NS IRRIG 1000ML POUR BTL (IV SOLUTION) ×2 IMPLANT
PACK GENERAL/GYN (CUSTOM PROCEDURE TRAY) ×2 IMPLANT
PAD ARMBOARD 7.5X6 YLW CONV (MISCELLANEOUS) ×4 IMPLANT
SPONGE GAUZE 4X4 12PLY (GAUZE/BANDAGES/DRESSINGS) ×2 IMPLANT
SUT SILK 3 0 SH CR/8 (SUTURE) ×1 IMPLANT
SUT VIC AB 3-0 SH 27 (SUTURE) ×2
SUT VIC AB 3-0 SH 27X BRD (SUTURE) ×1 IMPLANT
SUT VICRYL 4-0 PS2 18IN ABS (SUTURE) ×1 IMPLANT
SYR CONTROL 10ML LL (SYRINGE) ×2 IMPLANT
TOWEL OR 17X24 6PK STRL BLUE (TOWEL DISPOSABLE) ×2 IMPLANT
TOWEL OR 17X26 10 PK STRL BLUE (TOWEL DISPOSABLE) ×2 IMPLANT
WATER STERILE IRR 1000ML POUR (IV SOLUTION) ×2 IMPLANT

## 2012-10-16 NOTE — Progress Notes (Signed)
HCTZ 25 mg 1 po daily as needed for swelling #30 with no refills called into [facility] per request Dr.  Mariel Sleet.  Patient notified.

## 2012-10-16 NOTE — Brief Op Note (Signed)
10/16/2012  9:53 AM  PATIENT:  [name] [name]  42 y.o. female  PRE-OPERATIVE DIAGNOSIS:  Hodgkin's disease  POST-OPERATIVE DIAGNOSIS:  Hodgkin's disease  PROCEDURE:  Procedure(s): REMOVAL PORT-A-CATH (Left)  SURGEON:  Surgeon(s) and Role:    * Loreli Slot, MD - Primary   ANESTHESIA:   general  EBL:  Total I/O In: 500 [I.V.:500] Out: 20 [Blood:20]   LOCAL MEDICATIONS USED:  MARCAINE    and Amount: 10 ml  SPECIMEN:  Source of Specimen:  port and catheter  COUNTS:  YES  PLAN OF CARE: Discharge to home after PACU  PATIENT DISPOSITION:  PACU - hemodynamically stable.   Delay start of Pharmacological VTE agent (>24hrs) due to surgical blood loss or risk of bleeding: not applicable

## 2012-10-16 NOTE — Anesthesia Preprocedure Evaluation (Addendum)
Anesthesia Evaluation  Patient identified by MRN, date of birth, ID band Patient awake    Reviewed: Allergy & Precautions, H&P , NPO status , Patient's Chart, lab work & pertinent test results, reviewed documented beta blocker date and time   Airway Mallampati: II TM Distance: >3 FB Neck ROM: full    Dental   Pulmonary shortness of breath, sleep apnea , pneumonia -, resolved,  breath sounds clear to auscultation        Cardiovascular hypertension, On Medications +CHF + Valvular Problems/Murmurs Rhythm:regular     Neuro/Psych  Headaches, PSYCHIATRIC DISORDERS  Neuromuscular disease    GI/Hepatic negative GI ROS, Neg liver ROS,   Endo/Other  negative endocrine ROS  Renal/GU negative Renal ROS  negative genitourinary   Musculoskeletal   Abdominal   Peds  Hematology  (+) anemia ,   Anesthesia Other Findings See surgeon's H&P   Reproductive/Obstetrics negative OB ROS                           Anesthesia Physical Anesthesia Plan  ASA: III  Anesthesia Plan: General   Post-op Pain Management:    Induction:   Airway Management Planned: LMA  Additional Equipment:   Intra-op Plan:   Post-operative Plan:   Informed Consent: I have reviewed the patients History and Physical, chart, labs and discussed the procedure including the risks, benefits and alternatives for the proposed anesthesia with the patient or authorized representative who has indicated his/her understanding and acceptance.   Dental Advisory Given  Plan Discussed with: CRNA and Surgeon  Anesthesia Plan Comments:        Anesthesia Quick Evaluation

## 2012-10-16 NOTE — Anesthesia Postprocedure Evaluation (Signed)
Anesthesia Post Note  Patient: [name] [name]  Procedure(s) Performed: Procedure(s) (LRB): REMOVAL PORT-A-CATH (Left)  Anesthesia type: General  Patient location: PACU  Post pain: Pain level controlled  Post assessment: Patient's Cardiovascular Status Stable  Last Vitals:  Filed Vitals:   10/16/12 0945  BP: 121/74  Pulse:   Temp:   Resp:     Post vital signs: Reviewed and stable  Level of consciousness: alert  Complications: No apparent anesthesia complications

## 2012-10-16 NOTE — H&P (Signed)
[name] [name] is an 42 y.o. female.   Chief Complaint:completed chemo, no longer needs port HPI: 42 yo WF recently completed chemotherapy for Hodgkin's. She no longer needs port and wishes to have it removed. She feels well.  Past Medical History  Diagnosis Date  . Fibromyalgia   . Eczema   . Depression     parents died close together "was hard"  . Restless leg   . Heart murmur     Told 'nothing to worry about"  No 2D Echo  . Sleep apnea     Sleep Study- 1996- "little apnea- no tx"  . Headache     Birth control pills help  . Hemorrhoid   . Anemia     As a child and while pregnant  . 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 Hypertension     recently dx with hodgkins  . Dyspnea     all the time .".due to the mass"  . Pneumonia April 2013  . CHF (congestive heart failure)     pt stated it was due to the CA but after chemo began it went away  . Dry skin     Past Surgical History  Procedure Laterality Date  . Wisdom tooth extraction    . Dilation and curettage of uterus  1991  . Mediastinal mass biopsy  03/05/12  . Bone marrow biopsy  03/20/2012  . Portacath placement  03/26/2012    Procedure: INSERTION PORT-A-CATH;  Surgeon: Loreli Slot, MD;  Location: [facility] OR;  Service: Thoracic;  Laterality: N/A;    Family History  Problem Relation Age of Onset  . Cancer Paternal Uncle   . Cancer Maternal Grandmother     lung ca mets to throat   Social History:  reports that she has never smoked. She has never used smokeless tobacco. She reports that  drinks alcohol. She reports that she does not use illicit drugs.  Allergies:  Allergies  Allergen Reactions  . Other Other (See Comments)    Patient can not have straight oxygen due to reaction with chemo medications.   . Sulfur Anaphylaxis    Medications Prior to Admission  Medication Sig Dispense Refill  . acyclovir (ZOVIRAX) 400 MG tablet Take 1 tablet (400 mg total) by mouth 2 (two) times  daily. Take throughout chemo.Take 400 mg by mouth 2 (two) times daily. Take throughout chemo.  90 tablet  0  . ALPRAZolam (XANAX) 1 MG tablet Take 0.5 mg by mouth 4 (four) times daily as needed for anxiety.      . chlorpheniramine-HYDROcodone (TUSSIONEX) 10-8 MG/5ML LQCR Take 5 mLs by mouth every 12 (twelve) hours as needed. cough      . cholecalciferol (VITAMIN D) 1000 UNITS tablet Take 1,000 Units by mouth daily.      Marland Kitchen gabapentin (NEURONTIN) 600 MG tablet Take 600 mg by mouth at bedtime.       . Ibuprofen-Diphenhydramine Cit (ADVIL PM PO) Take 2 tablets by mouth as needed.      Marland Kitchen KRILL OIL 1000 MG CAPS Take 1 capsule by mouth daily.       Marland Kitchen lisinopril-hydrochlorothiazide (PRINZIDE,ZESTORETIC) 20-12.5 MG per tablet Take 1 tablet by mouth daily.      . methocarbamol (ROBAXIN) 500 MG tablet Take 1 tablet (500 mg total) by mouth 2 (two) times daily. Refill given on 08/30/12 per Dr. Mariel Sleet.  60 tablet  0  . norethindrone-ethinyl estradiol (JUNEL FE,GILDESS FE,LOESTRIN FE) 1-20  MG-MCG tablet Take 1 tablet by mouth daily.      . potassium chloride (K-DUR,KLOR-CON) 10 MEQ tablet Take 10 mEq by mouth 2 (two) times daily.       . prednisoLONE acetate (PRED FORTE) 1 % ophthalmic suspension Place 1 drop into both eyes 3 (three) times daily.  5 mL  0  . ranitidine (ZANTAC) 150 MG capsule Take 150 mg by mouth daily.       Marland Kitchen senna (SENOKOT) 8.6 MG TABS Take 1 tablet by mouth at bedtime.      Marland Kitchen tiZANidine (ZANAFLEX) 4 MG tablet Take 4-6 mg by mouth every 6 (six) hours as needed. Muscle spasms.      Marland Kitchen acetaminophen (TYLENOL) 500 MG tablet Take 1,000 mg by mouth every 6 (six) hours as needed for pain.      Marland Kitchen levofloxacin (LEVAQUIN) 750 MG tablet       . LORazepam (ATIVAN) 1 MG tablet Take 1 tablet by mouth at bedtime as needed. For nausea        Results for orders placed during the hospital encounter of 10/14/12 (from the past 48 hour(s))  BASIC METABOLIC PANEL     Status: Abnormal   Collection Time     10/14/12  3:38 PM      Result Value Range   Sodium 137  135 - 145 mEq/L   Potassium 4.4  3.5 - 5.1 mEq/L   Chloride 100  96 - 112 mEq/L   CO2 29  19 - 32 mEq/L   Glucose, Bld 100 (*) 70 - 99 mg/dL   BUN 14  6 - 23 mg/dL   Creatinine, Ser 2.13  0.50 - 1.10 mg/dL   Calcium 08.6  8.4 - 57.8 mg/dL   GFR calc non Af Amer >90  >90 mL/min   GFR calc Af Amer >90  >90 mL/min   Comment:            The eGFR has been calculated     using the CKD EPI equation.     This calculation has not been     validated in all clinical     situations.     eGFR's persistently     <90 mL/min signify     possible Chronic Kidney Disease.  CBC     Status: Abnormal   Collection Time    10/14/12  3:38 PM      Result Value Range   WBC 4.4  4.0 - 10.5 K/uL   RBC 3.27 (*) 3.87 - 5.11 MIL/uL   Hemoglobin 10.4 (*) 12.0 - 15.0 g/dL   HCT 46.9 (*) 62.9 - 52.8 %   MCV 87.8  78.0 - 100.0 fL   MCH 31.8  26.0 - 34.0 pg   MCHC 36.2 (*) 30.0 - 36.0 g/dL   RDW 41.3 (*) 24.4 - 01.0 %   Platelets 132 (*) 150 - 400 K/uL  SURGICAL PCR SCREEN     Status: None   Collection Time    10/14/12  3:43 PM      Result Value Range   MRSA, PCR NEGATIVE  NEGATIVE   Staphylococcus aureus NEGATIVE  NEGATIVE   Comment:            The Xpert SA Assay (FDA     approved for NASAL specimens     in patients over 30 years of age),     is one component of     a comprehensive surveillance     program.  Test performance has     been validated by [facility] for patients greater     than or equal to 41 year old.     It is not intended     to diagnose infection nor to     guide or monitor treatment.   No results found.  ROS  Blood pressure 105/71, pulse 81, temperature 98 F (36.7 C), temperature source Oral, resp. rate 20, SpO2 100.00%. Physical Exam  Vitals reviewed. Constitutional: She is oriented to person, place, and time. She appears well-developed and well-nourished. No distress.  HENT:  Head: Normocephalic and  atraumatic.  Eyes: Pupils are equal, round, and reactive to light.  Cardiovascular: Normal rate, regular rhythm and normal heart sounds.   Respiratory: Effort normal and breath sounds normal.  GI: Soft. There is no tenderness.  Musculoskeletal: She exhibits no edema.  Lymphadenopathy:    She has no cervical adenopathy.  Neurological: She is alert and oriented to person, place, and time. No cranial nerve deficit.  Skin: Skin is warm and dry.     Assessment/Plan 42 yo WF completed treatment for Hodgkin's disease and no longer needs port-a-cath. We will remove it at her request. She is aware of the risks of bleeding and infection. Other risks include medication reaction. She accepts the risks and agrees to proceed. All questions are answered.  Nehemie Casserly C 10/16/2012, 8:28 AM

## 2012-10-16 NOTE — Transfer of Care (Signed)
Immediate Anesthesia Transfer of Care Note  Patient: [name] [name]  Procedure(s) Performed: Procedure(s): REMOVAL PORT-A-CATH (Left)  Patient Location: PACU  Anesthesia Type:General  Level of Consciousness: awake, alert , oriented and patient cooperative  Airway & Oxygen Therapy: Patient Spontanous Breathing and Patient connected to nasal cannula oxygen  Post-op Assessment: Report given to PACU RN, Post -op Vital signs reviewed and stable and Patient moving all extremities  Post vital signs: Reviewed and stable  Complications: No apparent anesthesia complications

## 2012-10-16 NOTE — Anesthesia Procedure Notes (Signed)
Procedure Name: LMA Insertion Date/Time: 10/16/2012 8:44 AM Performed by: Jerilee Hoh Pre-anesthesia Checklist: Patient identified, Emergency Drugs available, Suction available and Patient being monitored Patient Re-evaluated:Patient Re-evaluated prior to inductionOxygen Delivery Method: Circle system utilized Preoxygenation: Pre-oxygenation with 100% oxygen Intubation Type: IV induction LMA: LMA inserted LMA Size: 4.0 Tube type: Oral Number of attempts: 1 Placement Confirmation: positive ETCO2 and breath sounds checked- equal and bilateral Tube secured with: Tape Dental Injury: Teeth and Oropharynx as per pre-operative assessment

## 2012-10-16 NOTE — Preoperative (Signed)
Beta Blockers   Reason not to administer Beta Blockers:Not Applicable 

## 2012-10-17 ENCOUNTER — Ambulatory Visit
Admission: RE | Admit: 2012-10-17 | Discharge: 2012-10-17 | Disposition: A | Discharge status: Home / Self Care | Payer: BC Managed Care – PPO | Source: Ambulatory Visit | Attending: Radiation Oncology | Admitting: Radiation Oncology

## 2012-10-17 ENCOUNTER — Encounter (HOSPITAL_COMMUNITY): Payer: Self-pay | Admitting: Thoracic Surgery (Cardiothoracic Vascular Surgery)

## 2012-10-17 DIAGNOSIS — R059 Cough, unspecified: Secondary | ICD-10-CM | POA: Insufficient documentation

## 2012-10-17 DIAGNOSIS — R5381 Other malaise: Secondary | ICD-10-CM | POA: Insufficient documentation

## 2012-10-17 DIAGNOSIS — R05 Cough: Secondary | ICD-10-CM | POA: Insufficient documentation

## 2012-10-17 DIAGNOSIS — Z79899 Other long term (current) drug therapy: Secondary | ICD-10-CM | POA: Insufficient documentation

## 2012-10-17 DIAGNOSIS — L299 Pruritus, unspecified: Secondary | ICD-10-CM | POA: Insufficient documentation

## 2012-10-17 DIAGNOSIS — Z510 Encounter for antineoplastic radiation therapy: Secondary | ICD-10-CM | POA: Insufficient documentation

## 2012-10-17 DIAGNOSIS — C8190 Hodgkin lymphoma, unspecified, unspecified site: Secondary | ICD-10-CM

## 2012-10-17 DIAGNOSIS — J029 Acute pharyngitis, unspecified: Secondary | ICD-10-CM | POA: Insufficient documentation

## 2012-10-17 NOTE — Progress Notes (Signed)
[facility] Radiation Oncology Complex Simulation/Treatment Planning/IMRT note   [name] [name]  [id number] 10/17/2012  1970-10-26  Dx: Hodgkin's lymphoma   CONSENT VERIFIED:yes   SET UP: Patient is set-up supine   IMMOBILIZATION: The following immobilization is used:Aquaplast Mask   NARRATIVE:The patient was brought to the CT Simulation planning suite.  Identity was confirmed.  All relevant records and images related to the planned course of therapy were reviewed.  Then, the patient was positioned in a stable reproducible clinical set-up for radiation therapy using an aquaplast mask.  IV contrast was administered and CT images were obtained.  Skin markings were placed.  The CT images were loaded into the planning software where the target and avoidance structures were contoured.  The patient's previous PET scan was fused with the planning CT to aid in target delineation. The radiation prescription was entered and confirmed.   TREATMENT PLANNING NOTE:  Treatment planning then occurred. I have requested : Intensity Modulated Radiotherapy (IMRT) is medically necessary for this case for the following reason:  Dose homogeneity and treatment of a head and neck site as well as avoidance of the normal breast parenchyma and heart sparing.

## 2012-10-17 NOTE — Op Note (Signed)
[name] [name], [name] [name] NO.:  [id number]  MEDICAL RECORD NO.:  [id number]  LOCATION:  [facility]                         FACILITY:  [facility]  PHYSICIAN:  Salvatore Decent. Dorris Fetch, M.D.DATE OF BIRTH:  02/11/1971  DATE OF PROCEDURE:  10/16/2012 DATE OF DISCHARGE:  10/16/2012                              OPERATIVE REPORT   PREOPERATIVE DIAGNOSIS:  Completed chemotherapy for Hodgkin disease. No longer needs Port-A-Cath.  POSTOPERATIVE DIAGNOSIS:  Completed chemotherapy for Hodgkin disease. No longer needs Port-A-Cath.  PROCEDURE:  Removal of Port-A-Cath.  SURGEON:  Salvatore Decent. Dorris Fetch, M.D.  ANESTHESIA:  General with laryngeal mask airway.  FINDINGS:  Port-A-Cath removed intact without difficulty.  CLINICAL NOTE:  [name] is a 42 year old woman who was recently diagnosed with Hodgkin's disease.  She has been treated with chemotherapy via a Port-A-Cath.  She has completed her course of chemotherapy, and there is no further need for a port-a-cath, and she wishes to have the [facility]- A-Cath removed.  She understands the risks of bleeding, infection, as well as a reaction to medication.  She understood and accepted the risks and wished to proceed.  OPERATIVE NOTE:  [name] was brought to the operating room on Oct 16, 2012.  She was given intravenous antibiotics. General anesthesia was induced, and the patient was managed with a laryngeal mask airway by the Anesthesia Service.  The chest was prepped and draped in the usual sterile fashion.  An incision was made through the patient's previous scar in the left deltopectoral groove.  This was carried through the skin and subcutaneous tissue.  Hemostasis was achieved with electrocautery.  The capsule overlying the port was incised, and the port was mobilized and the port and catheter were removed intact.  There was bleeding from the catheter tract.  A 2-0 silk suture was used to ligate this area.  The wound was copiously  irrigated with saline.  The incision was closed in 2 layers with a running 3-0 Vicryl subcutaneous suture followed by a running 4-0 Vicryl subcuticular suture.  All sponge, needle, and instrument counts were correct at the end of the procedure.  The patient was taken to the postanesthetic care unit in good condition.     Salvatore Decent Dorris Fetch, M.D.    SCH/MEDQ  D:  10/16/2012  T:  10/17/2012  Job:  [id number]

## 2012-10-18 ENCOUNTER — Ambulatory Visit (HOSPITAL_COMMUNITY)
Admission: RE | Admit: 2012-10-18 | Discharge: 2012-10-18 | Disposition: A | Discharge status: Home / Self Care | Payer: BC Managed Care – PPO | Source: Ambulatory Visit | Attending: Oncology | Admitting: Oncology

## 2012-10-18 ENCOUNTER — Encounter (HOSPITAL_COMMUNITY): Payer: BC Managed Care – PPO | Attending: Oncology

## 2012-10-18 DIAGNOSIS — C8190 Hodgkin lymphoma, unspecified, unspecified site: Secondary | ICD-10-CM | POA: Insufficient documentation

## 2012-10-18 DIAGNOSIS — C8119 Nodular sclerosis classical Hodgkin lymphoma, extranodal and solid organ sites: Secondary | ICD-10-CM

## 2012-10-18 LAB — COMPREHENSIVE METABOLIC PANEL
ALT: 27 U/L (ref 0–35)
AST: 29 U/L (ref 0–37)
Albumin: 3.4 g/dL — ABNORMAL LOW (ref 3.5–5.2)
Alkaline Phosphatase: 70 U/L (ref 39–117)
BUN: 14 mg/dL (ref 6–23)
CO2: 27 mEq/L (ref 19–32)
Calcium: 9.5 mg/dL (ref 8.4–10.5)
Chloride: 97 mEq/L (ref 96–112)
Creatinine, Ser: 0.69 mg/dL (ref 0.50–1.10)
GFR calc Af Amer: >90 mL/min (ref >90)
GFR calc non Af Amer: >90 mL/min (ref >90)
Glucose, Bld: 91 mg/dL (ref 70–99)
Potassium: 3.7 mEq/L (ref 3.5–5.1)
Sodium: 136 mEq/L (ref 135–145)
Total Bilirubin: 0.5 mg/dL (ref 0.3–1.2)
Total Protein: 6.5 g/dL (ref 6.0–8.3)

## 2012-10-18 LAB — CBC WITH DIFFERENTIAL/PLATELET
Basophils Absolute: 0 10*3/uL (ref 0.0–0.1)
Basophils Relative: 0 % (ref 0–1)
Eosinophils Absolute: 0.1 10*3/uL (ref 0.0–0.7)
Eosinophils Relative: 3 % (ref 0–5)
HCT: 28.9 % — ABNORMAL LOW (ref 36.0–46.0)
Hemoglobin: 10.5 g/dL — ABNORMAL LOW (ref 12.0–15.0)
Lymphocytes Relative: 13 % (ref 12–46)
Lymphs Abs: 0.5 10*3/uL — ABNORMAL LOW (ref 0.7–4.0)
MCH: 32.5 pg (ref 26.0–34.0)
MCHC: 36.3 g/dL — ABNORMAL HIGH (ref 30.0–36.0)
MCV: 89.5 fL (ref 78.0–100.0)
Monocytes Absolute: 0.7 10*3/uL (ref 0.1–1.0)
Monocytes Relative: 19 % — ABNORMAL HIGH (ref 3–12)
Neutro Abs: 2.3 10*3/uL (ref 1.7–7.7)
Neutrophils Relative %: 64 % (ref 43–77)
Platelets: 143 10*3/uL — ABNORMAL LOW (ref 150–400)
RBC: 3.23 MIL/uL — ABNORMAL LOW (ref 3.87–5.11)
RDW: 17.7 % — ABNORMAL HIGH (ref 11.5–15.5)
WBC: 3.6 10*3/uL — ABNORMAL LOW (ref 4.0–10.5)

## 2012-10-18 LAB — MAGNESIUM: Magnesium: 1.9 mg/dL (ref 1.5–2.5)

## 2012-10-18 LAB — SEDIMENTATION RATE: Sed Rate: 34 mm/hr — ABNORMAL HIGH (ref 0–22)

## 2012-10-18 NOTE — Progress Notes (Signed)
Labs drawn today for cmp,cbc/diff,sed rate,mg

## 2012-10-25 ENCOUNTER — Inpatient Hospital Stay (HOSPITAL_COMMUNITY)
Admission: RE | Admit: 2012-10-25 | Discharge: 2012-10-25 | Disposition: A | Discharge status: Home / Self Care | Payer: BC Managed Care – PPO | Source: Ambulatory Visit

## 2012-10-25 ENCOUNTER — Encounter (HOSPITAL_BASED_OUTPATIENT_CLINIC_OR_DEPARTMENT_OTHER): Payer: BC Managed Care – PPO | Admitting: Oncology

## 2012-10-25 VITALS — BP 127/88 | HR 116 | Temp 98.2°F | Resp 18

## 2012-10-25 DIAGNOSIS — C8190 Hodgkin lymphoma, unspecified, unspecified site: Secondary | ICD-10-CM

## 2012-10-25 DIAGNOSIS — C8119 Nodular sclerosis classical Hodgkin lymphoma, extranodal and solid organ sites: Secondary | ICD-10-CM

## 2012-10-25 DIAGNOSIS — J984 Other disorders of lung: Secondary | ICD-10-CM

## 2012-10-25 MED ORDER — PENTAMIDINE ISETHIONATE 300 MG IN SOLR
300.0000 mg | Freq: Once | RESPIRATORY_TRACT | Status: AC
  Administered 2012-10-25: 300 mg via RESPIRATORY_TRACT
  Filled 2012-10-25: qty 300

## 2012-10-25 NOTE — Patient Instructions (Addendum)
[facility] Discharge Instructions  RECOMMENDATIONS MADE BY THE CONSULTANT AND ANY TEST RESULTS WILL BE SENT TO YOUR REFERRING PHYSICIAN.  EXAM FINDINGS BY THE PHYSICIAN TODAY AND SIGNS OR SYMPTOMS TO REPORT TO CLINIC OR PRIMARY PHYSICIAN: Discussion by MD.  [name] [name] are doing well.  Your last pentamidine treatment will be today and we will recheck your blood work in 8 weeks and see you afterwards.  MEDICATIONS PRESCRIBED:  none  INSTRUCTIONS GIVEN AND DISCUSSED: Report any fevers, night sweats, any new lumps, shortness of breath, etc.  SPECIAL INSTRUCTIONS/FOLLOW-UP: Blood work & follow-up in 8 weeks, blood work in December just before PET Scan.  Thank you for choosing [name] to provide your oncology and hematology care.  To afford each patient quality time with our providers, please arrive at least 15 minutes before your scheduled appointment time.  With your help, our goal is to use those 15 minutes to complete the necessary work-up to ensure our physicians have the information they need to help with your evaluation and healthcare recommendations.    Effective January 1st, 2014, we ask that you re-schedule your appointment with our physicians should you arrive 10 or more minutes late for your appointment.  We strive to give you quality time with our providers, and arriving late affects you and other patients whose appointments are after yours.    Again, thank you for choosing [facility].  Our hope is that these requests will decrease the amount of time that you wait before being seen by our physicians.       _____________________________________________________________  Should you have questions after your visit to [facility], please contact our office at [phone] between the hours of 8:30 a.m. and 5:00 p.m.  Voicemails left after 4:30 p.m. will not be returned until the following business day.  For prescription refill  requests, have your pharmacy contact our office with your prescription refill request.

## 2012-10-25 NOTE — Progress Notes (Signed)
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 She was then switched to dose adjusted BEACOPP x2 cycles. Her PET scan after the second cycle showed complete remission. Because of probable inflammatory changes in the lungs and a prior Citrobacter  blood stream/pulmonary infection felt to be the cause of the PET scan findings in the lungs she has had Levaquin which she is now finished. She has received or will receive her last 03/21/2018 inhalation treatment today.  Her chest x-ray the other day has shown clearing of all infiltrates and on exam today she has no cervical lymphadenopathy, no supraclavicular lymphadenopathy, infraclavicular adenopathy, or axillary adenopathy. Her lungs are perfectly clear to auscultation today. The patient looks much better and feels great.  She will start radiation therapy on 10/28/2012 we will see her in 8 weeks. That time we'll do blood work and exam. We will schedule her PET scan for December along with blood work at that time.  If she has problems in the interim she is to call us.

## 2012-10-28 ENCOUNTER — Ambulatory Visit
Admission: RE | Admit: 2012-10-28 | Discharge: 2012-10-28 | Disposition: A | Discharge status: Home / Self Care | Payer: BC Managed Care – PPO | Source: Ambulatory Visit | Attending: Radiation Oncology | Admitting: Radiation Oncology

## 2012-10-28 VITALS — Wt 209.0 lb

## 2012-10-28 DIAGNOSIS — J9859 Other diseases of mediastinum, not elsewhere classified: Secondary | ICD-10-CM

## 2012-10-28 MED ORDER — BIAFINE EX EMUL
CUTANEOUS | Status: DC | PRN
  Administered 2012-10-28: 17:00:00 via TOPICAL

## 2012-10-28 NOTE — Progress Notes (Signed)
Patient education, radiation therapy and you book, biafine cream, my business card, calander, discussed side effects, sign/symptoms to report, Fatigue,skin irritation, loss appetite,dif swallowing, throat irritation, nutrition, jigh protein,high calories, may need to go to 6 smaller softer meals  As time goes on, can call for any questions/concerns, sees MD,staff RN weekly& prn,teavh back given 4:41 PM

## 2012-10-29 ENCOUNTER — Ambulatory Visit
Admission: RE | Admit: 2012-10-29 | Discharge: 2012-10-29 | Disposition: A | Discharge status: Home / Self Care | Payer: BC Managed Care – PPO | Source: Ambulatory Visit | Attending: Radiation Oncology | Admitting: Radiation Oncology

## 2012-10-30 ENCOUNTER — Ambulatory Visit
Admission: RE | Admit: 2012-10-30 | Discharge: 2012-10-30 | Disposition: A | Discharge status: Home / Self Care | Payer: BC Managed Care – PPO | Source: Ambulatory Visit | Attending: Radiation Oncology | Admitting: Radiation Oncology

## 2012-10-30 DIAGNOSIS — C8190 Hodgkin lymphoma, unspecified, unspecified site: Secondary | ICD-10-CM

## 2012-10-30 NOTE — Progress Notes (Signed)
Weekly Management Note Current Dose: 6  Gy  Projected Dose: 36 Gy   Narrative:  The patient presents for routine under treatment assessment.  CBCT/MVCT images/Port film x-rays were reviewed.  The chart was checked. Doing well. Questions about side effects and dysphagia. Using biafene.  Physical Findings: Weight:  . Unchanged  Impression:  The patient is tolerating radiation.  Plan:  Continue treatment as planned. Discussed side effects.

## 2012-10-31 ENCOUNTER — Ambulatory Visit
Admission: RE | Admit: 2012-10-31 | Discharge: 2012-10-31 | Disposition: A | Discharge status: Home / Self Care | Payer: BC Managed Care – PPO | Source: Ambulatory Visit | Attending: Radiation Oncology | Admitting: Radiation Oncology

## 2012-11-01 ENCOUNTER — Ambulatory Visit
Admission: RE | Admit: 2012-11-01 | Discharge: 2012-11-01 | Disposition: A | Discharge status: Home / Self Care | Payer: BC Managed Care – PPO | Source: Ambulatory Visit | Attending: Radiation Oncology | Admitting: Radiation Oncology

## 2012-11-05 ENCOUNTER — Telehealth: Payer: Self-pay | Admitting: Radiation Oncology

## 2012-11-05 ENCOUNTER — Ambulatory Visit
Admission: RE | Admit: 2012-11-05 | Discharge: 2012-11-05 | Disposition: A | Discharge status: Home / Self Care | Payer: BC Managed Care – PPO | Source: Ambulatory Visit | Attending: Radiation Oncology | Admitting: Radiation Oncology

## 2012-11-05 ENCOUNTER — Encounter: Payer: Self-pay | Admitting: Radiation Oncology

## 2012-11-05 NOTE — Telephone Encounter (Signed)
On 10/30/12, faxed records to [facility]. OK per SW.  Received confirmation.

## 2012-11-05 NOTE — Progress Notes (Signed)
Ms. Lyday has received 6 fractions to her chest and neck.  She is sitting with her head lying against the wall because she is "very fatigued and weak"  And states it has gotten worse since the start of radiation.  VSS and her O2 sat is 100% on RA  Note that she has  dry cough and is using  Tussionex.  She denies any pain presently.

## 2012-11-06 ENCOUNTER — Other Ambulatory Visit (HOSPITAL_COMMUNITY): Payer: Self-pay | Admitting: Oncology

## 2012-11-06 ENCOUNTER — Ambulatory Visit
Admission: RE | Admit: 2012-11-06 | Discharge: 2012-11-06 | Disposition: A | Discharge status: Home / Self Care | Payer: BC Managed Care – PPO | Source: Ambulatory Visit | Attending: Radiation Oncology | Admitting: Radiation Oncology

## 2012-11-06 ENCOUNTER — Telehealth (HOSPITAL_COMMUNITY): Payer: Self-pay | Admitting: Oncology

## 2012-11-06 DIAGNOSIS — C8190 Hodgkin lymphoma, unspecified, unspecified site: Secondary | ICD-10-CM

## 2012-11-06 DIAGNOSIS — R059 Cough, unspecified: Secondary | ICD-10-CM

## 2012-11-06 DIAGNOSIS — R05 Cough: Secondary | ICD-10-CM

## 2012-11-06 MED ORDER — HYDROCOD POLST-CHLORPHEN POLST 10-8 MG/5ML PO LQCR: 5.0000 mL | Freq: Two times a day (BID) | ORAL | Status: DC | PRN

## 2012-11-06 NOTE — Progress Notes (Signed)
Weekly Management Note Current Dose:14 Gy  Projected Dose:36 Gy   Narrative:  The patient presents for routine under treatment assessment.  CBCT/MVCT images/Port film x-rays were reviewed.  The chart was checked. Significant and profound fatigue.  Slept 85% of weekend "life is passing me by" No dysphagia.   Physical Findings:  Unchanged  Vitals: There were no vitals filed for this visit. Weight:  Wt Readings from Last 3 Encounters:  11/05/12 207 lb 11.2 oz (94.212 kg)  10/28/12 209 lb (94.802 kg)  10/14/12 205 lb 4.8 oz (93.123 kg)   Lab Results  Component Value Date   WBC 3.6* 10/18/2012   HGB 10.5* 10/18/2012   HCT 28.9* 10/18/2012   MCV 89.5 10/18/2012   PLT 143* 10/18/2012   Lab Results  Component Value Date   CREATININE 0.69 10/18/2012   BUN 14 10/18/2012   NA 136 10/18/2012   K 3.7 10/18/2012   CL 97 10/18/2012   CO2 27 10/18/2012     Impression:  The patient is tolerating radiation.  Plan:  Continue treatment as planned. Discussed need to heal and restore. Would be unusual for RT to be causing this significant amount of fatigue Discussed self care.  Will refer to patient and family support team.

## 2012-11-06 NOTE — Telephone Encounter (Signed)
Rx called in to [facility]

## 2012-11-06 NOTE — Progress Notes (Addendum)
Ms. Gains has received 7 fractions to her chest and neck. She is "very fatigued" and states it has gotten worse since the start of radiation.  Note that she has dry cough and is using Tussionex. She denies any pain presently. Vital signs obtained on yesterday.

## 2012-11-07 ENCOUNTER — Other Ambulatory Visit (HOSPITAL_COMMUNITY): Payer: BC Managed Care – PPO

## 2012-11-07 ENCOUNTER — Ambulatory Visit
Admission: RE | Admit: 2012-11-07 | Discharge: 2012-11-07 | Disposition: A | Discharge status: Home / Self Care | Payer: BC Managed Care – PPO | Source: Ambulatory Visit | Attending: Radiation Oncology | Admitting: Radiation Oncology

## 2012-11-08 ENCOUNTER — Ambulatory Visit
Admission: RE | Admit: 2012-11-08 | Discharge: 2012-11-08 | Disposition: A | Discharge status: Home / Self Care | Payer: BC Managed Care – PPO | Source: Ambulatory Visit | Attending: Radiation Oncology | Admitting: Radiation Oncology

## 2012-11-11 ENCOUNTER — Encounter: Payer: Self-pay | Admitting: Radiation Oncology

## 2012-11-11 ENCOUNTER — Ambulatory Visit
Admission: RE | Admit: 2012-11-11 | Discharge: 2012-11-11 | Disposition: A | Discharge status: Home / Self Care | Payer: BC Managed Care – PPO | Source: Ambulatory Visit | Attending: Radiation Oncology | Admitting: Radiation Oncology

## 2012-11-11 ENCOUNTER — Encounter: Payer: Self-pay | Admitting: *Deleted

## 2012-11-11 VITALS — BP 128/85 | HR 116 | Temp 98.4°F | Ht 66.0 in | Wt 209.2 lb

## 2012-11-11 DIAGNOSIS — C8190 Hodgkin lymphoma, unspecified, unspecified site: Secondary | ICD-10-CM

## 2012-11-11 MED ORDER — HYDROCODONE-ACETAMINOPHEN 7.5-325 MG/15ML PO SOLN: 15.0000 mL | Freq: Four times a day (QID) | ORAL | Status: DC | PRN

## 2012-11-11 NOTE — Progress Notes (Signed)
[facility]    Radiation Oncology [address] [place]     Maryln Gottron, M.D. [place], [place] [address]               Billie Lade, M.D., Ph.D. Phone: [phone]      Molli Hazard A. Kathrynn Running, M.D. Fax: [phone]      Radene Gunning, M.D., Ph.D.         Lurline Hare, M.D.         Grayland Jack, M.D Weekly Treatment Management Note  Name: [name] [name]     MRN: [id number]        CSN: [id number] Date: 11/11/2012      DOB: August 10, 1970  CC: [name], MD         [name]    Status: Outpatient  Diagnosis: Hodgkin's disease  Current Dose: 20 Gy  Current Fraction: 10  Planned Dose: 36 Gy  Narrative: Holland Falling was seen today for weekly treatment management. The chart was checked and MVCT were reviewed. The patient requested to be seen today for complaints of a severe sore throat. She also complains of significant itching along the upper anterior chest region. The patient has been placing Biafine which actually makes her itching worse. she also complains of severe fatigue and is considering stopping her radiation therapy. She denies any breathing problems.  Other and Sulfur  Current Outpatient Prescriptions  Medication Sig Dispense Refill  . acyclovir (ZOVIRAX) 400 MG tablet Take 1 tablet (400 mg total) by mouth 2 (two) times daily. Take throughout chemo.Take 400 mg by mouth 2 (two) times daily. Take throughout chemo.  90 tablet  0  . ALPRAZolam (XANAX) 1 MG tablet Take 0.5 mg by mouth 4 (four) times daily as needed for anxiety.      . chlorpheniramine-HYDROcodone (TUSSIONEX) 10-8 MG/5ML LQCR Take 5 mLs by mouth every 12 (twelve) hours as needed. cough  100 mL  0  . cholecalciferol (VITAMIN D) 1000 UNITS tablet Take 1,000 Units by mouth daily.      Marland Kitchen emollient (BIAFINE) cream Apply 1 application topically 2 (two) times daily. Apply after rad tx and in am, not 4 hours prior to rad txs daily      . gabapentin (NEURONTIN) 600 MG tablet Take 600 mg by mouth at  bedtime.       . hydrochlorothiazide (HYDRODIURIL) 25 MG tablet Take 25 mg by mouth daily. To take 1 daily as needed for swelling      . Ibuprofen-Diphenhydramine Cit (ADVIL PM PO) Take 2 tablets by mouth as needed.      Marland Kitchen KRILL OIL 1000 MG CAPS Take 1 capsule by mouth daily.       Marland Kitchen lisinopril-hydrochlorothiazide (PRINZIDE,ZESTORETIC) 20-12.5 MG per tablet Take 1 tablet by mouth daily.      Marland Kitchen LORazepam (ATIVAN) 1 MG tablet Take 1 tablet by mouth at bedtime as needed. For nausea      . methocarbamol (ROBAXIN) 500 MG tablet Take 1 tablet (500 mg total) by mouth 2 (two) times daily. Refill given on 08/30/12 per Dr. Mariel Sleet.  60 tablet  0  . norethindrone-ethinyl estradiol (JUNEL FE,GILDESS FE,LOESTRIN FE) 1-20 MG-MCG tablet Take 1 tablet by mouth daily.      . potassium chloride (K-DUR,KLOR-CON) 10 MEQ tablet Take 10 mEq by mouth 2 (two) times daily.       . prednisoLONE acetate (PRED FORTE) 1 % ophthalmic suspension Place 1 drop into both eyes 3 (three) times daily.  5 mL  0  . tiZANidine (ZANAFLEX) 4 MG tablet Take 4-6 mg by mouth every 6 (six) hours as needed. Muscle spasms.      Marland Kitchen HYDROcodone-acetaminophen (HYCET) 7.5-325 mg/15 ml solution Take 15 mLs by mouth 4 (four) times daily as needed for pain.  473 mL  0   No current facility-administered medications for this encounter.   Facility-Administered Medications Ordered in Other Encounters  Medication Dose Route Frequency Provider Last Rate Last Dose  . sodium chloride 0.9 % injection 10 mL  10 mL Intravenous PRN Randall An, MD   10 mL at 10/03/12 1643   Labs:  Lab Results  Component Value Date   WBC 3.6* 10/18/2012   HGB 10.5* 10/18/2012   HCT 28.9* 10/18/2012   MCV 89.5 10/18/2012   PLT 143* 10/18/2012   Lab Results  Component Value Date   CREATININE 0.69 10/18/2012   BUN 14 10/18/2012   NA 136 10/18/2012   K 3.7 10/18/2012   CL 97 10/18/2012   CO2 27 10/18/2012   Lab Results  Component Value Date   ALT 27 10/18/2012   AST 29 10/18/2012    BILITOT 0.5 10/18/2012    Physical Examination:  height is 5\' 6"  (1.676 m) and weight is 209 lb 3.2 oz (94.892 kg). Her temperature is 98.4 F (36.9 C). Her blood pressure is 128/85 and her pulse is 116.    Wt Readings from Last 3 Encounters:  11/11/12 209 lb 3.2 oz (94.892 kg)  11/05/12 207 lb 11.2 oz (94.212 kg)  10/28/12 209 lb (94.802 kg)    The oral cavity is moist without secondary infection. The upper chest region shows a brisk erythema with radiation dermatitis. There is no moist desquamation. Lungs - Normal respiratory effort, chest expands symmetrically. Lungs are clear to auscultation, no crackles or wheezes.  Heart has regular rhythm with increased rate   Assessment:  Patient is having significant side effects with her treatments.  Plan: Continue treatment per original radiation prescription. I have asked her stop using the Biafine as she may be intolerant of this skin care cream. I recommend the patient obtain some hydrocortisone cream on the way home today for her itching.  Have recommended patient start using her Magic mouthwash again. She has been also given a prescription for Hycet.  I've also asked patient to consider using her tramadol.  The patient will be seen again tomorrow by Dr. Michell Heinrich after the patient's 11th treatment.

## 2012-11-11 NOTE — Progress Notes (Signed)
Ms. Millon has received 10 fractions to her chest and neck.  C/o sore throat Sore throat and dysphagia with dryness.  "My throat feels swollen."  Eating softer textured foods and is tolerating liquids po without any concerns. she appears very fatigue and has erythema on upper chest associated with itching unrelieved with Biafine.  VSS and her pulse remains eleated as per her usual.

## 2012-11-11 NOTE — Progress Notes (Signed)
[facility] Clinical Social Work  Clinical Social Work was referred by Careers information officer to provide support and assess for psychosocial needs.  CSW spoke with patient by phone.  [name] [name] reports feeling extremely fatigued and spends "most of my time on the couch". She also states increased itching and irritability on her throat.  CSW encouraged patient to share physical complaints with the radiation oncology team to determine if they have suggestions on how to minimize side effects.  The patient shares she is coping adequately with diagnosis/treatment plan, but has often thought about "giving up" treatment.  She states she has completed 9 of 18 treatments.  CSW encouraged [name] [name] to utilize support services including counseling services.  She expressed interest, but not agreeable to services at this time.  She states she is "too tired" at this time.  The patient identified her partner as her biggest support and has a postive network of friends.  She described finding most joy in outdoor activities such as hiking, kayaking, and enjoying nature.    Kathrin Penner, MSW, LCSW Clinical Social Worker [facility] [phone]

## 2012-11-12 ENCOUNTER — Ambulatory Visit
Admission: RE | Admit: 2012-11-12 | Discharge: 2012-11-12 | Disposition: A | Discharge status: Home / Self Care | Payer: BC Managed Care – PPO | Source: Ambulatory Visit | Attending: Radiation Oncology | Admitting: Radiation Oncology

## 2012-11-12 ENCOUNTER — Encounter: Payer: Self-pay | Admitting: Radiation Oncology

## 2012-11-12 ENCOUNTER — Ambulatory Visit
Admission: RE | Admit: 2012-11-12 | Payer: BC Managed Care – PPO | Source: Ambulatory Visit | Admitting: Radiation Oncology

## 2012-11-12 VITALS — BP 124/86 | HR 97 | Resp 16 | Wt 207.8 lb

## 2012-11-12 DIAGNOSIS — C8190 Hodgkin lymphoma, unspecified, unspecified site: Secondary | ICD-10-CM

## 2012-11-12 DIAGNOSIS — Z0279 Encounter for issue of other medical certificate: Secondary | ICD-10-CM

## 2012-11-12 NOTE — Progress Notes (Signed)
Weekly Management Note Current Dose: 10  Gy  Projected Dose: 18 Gy   Narrative:  The patient presents for routine under treatment assessment.  CBCT/MVCT images/Port film x-rays were reviewed.  The chart was checked. Requested to be seen prior to treatment for extreme fatigue.  Only other symptom is cough, controlled with tussinex. No pain, visual changes nausea, vomiting, fevers or diarrhea. Feels like she can't think clearly because she is so tired.  States she cannot stop working because "who is going to pay my bills." hycet is helping with sore throat.  Physical Findings: Weight: 132 lb 1.6 oz (59.92 kg). Slight erythema on chest. Appears tired.  Impression:  The patient is tolerating radiation.  Plan:  Continue treatment as planned. CBC, CMP. ESR, TSH to investigate fatigue.  Unusual for RT fatigue to be this pronounced and this early. ? Anemic?

## 2012-11-12 NOTE — Progress Notes (Signed)
Reports Hycet has greatly helped to improve throat pain associated with swallowing. Reports great fatigue. Reports she is allergic to biafine. Advised patient to begin using fruit of the earth pure aloe vera to anterior chest/neck area. Also, advised patient could use benadryl prior to bedtime to relieve itching as well as hydrocortisone. Radiation dermatitis noted anterior upper check without desquamation. Hyperpigmentation also noted.

## 2012-11-13 ENCOUNTER — Ambulatory Visit
Admission: RE | Admit: 2012-11-13 | Discharge: 2012-11-13 | Disposition: A | Discharge status: Home / Self Care | Payer: BC Managed Care – PPO | Source: Ambulatory Visit | Attending: Radiation Oncology | Admitting: Radiation Oncology

## 2012-11-13 ENCOUNTER — Telehealth: Payer: Self-pay | Admitting: *Deleted

## 2012-11-13 DIAGNOSIS — C8190 Hodgkin lymphoma, unspecified, unspecified site: Secondary | ICD-10-CM | POA: Insufficient documentation

## 2012-11-13 LAB — CBC WITH DIFFERENTIAL/PLATELET
BASO%: 0.3 % (ref 0.0–2.0)
Basophils Absolute: 0 10*3/uL (ref 0.0–0.1)
EOS%: 2.7 % (ref 0.0–7.0)
Eosinophils Absolute: 0.1 10*3/uL (ref 0.0–0.5)
HCT: 29.6 % — ABNORMAL LOW (ref 34.8–46.6)
HGB: 10.5 g/dL — ABNORMAL LOW (ref 11.6–15.9)
LYMPH%: 4.4 % — ABNORMAL LOW (ref 14.0–49.7)
MCH: 34.7 pg — ABNORMAL HIGH (ref 25.1–34.0)
MCHC: 35.6 g/dL (ref 31.5–36.0)
MCV: 97.6 fL (ref 79.5–101.0)
MONO#: 0.4 10*3/uL (ref 0.1–0.9)
MONO%: 15.6 % — ABNORMAL HIGH (ref 0.0–14.0)
NEUT#: 1.9 10*3/uL (ref 1.5–6.5)
NEUT%: 77 % — ABNORMAL HIGH (ref 38.4–76.8)
Platelets: 146 10*3/uL (ref 145–400)
RBC: 3.04 10*6/uL — ABNORMAL LOW (ref 3.70–5.45)
RDW: 16.3 % — ABNORMAL HIGH (ref 11.2–14.5)
WBC: 2.5 10*3/uL — ABNORMAL LOW (ref 3.9–10.3)
lymph#: 0.1 10*3/uL — ABNORMAL LOW (ref 0.9–3.3)

## 2012-11-13 LAB — COMPREHENSIVE METABOLIC PANEL (CC13)
ALT: 22 U/L (ref 0–55)
AST: 27 U/L (ref 5–34)
Albumin: 3.5 g/dL (ref 3.5–5.0)
Alkaline Phosphatase: 62 U/L (ref 40–150)
BUN: 20.4 mg/dL (ref 7.0–26.0)
CO2: 27 mEq/L (ref 22–29)
Calcium: 9 mg/dL (ref 8.4–10.4)
Chloride: 101 mEq/L (ref 98–107)
Creatinine: 1 mg/dL (ref 0.6–1.1)
Glucose: 91 mg/dl (ref 70–99)
Potassium: 3.6 mEq/L (ref 3.5–5.1)
Sodium: 136 mEq/L (ref 136–145)
Total Bilirubin: 0.88 mg/dL (ref 0.20–1.20)
Total Protein: 6.3 g/dL — ABNORMAL LOW (ref 6.4–8.3)

## 2012-11-13 LAB — SEDIMENTATION RATE: Sed Rate: 20 mm/hr (ref 0–22)

## 2012-11-13 LAB — TSH: TSH: 1.745 u[IU]/mL (ref 0.350–4.500)

## 2012-11-13 NOTE — Telephone Encounter (Signed)
Called and spoke with patient asked her to come between 3pm- 315pm today to get labs that were missed yesterday, she stated she would be here today for lab work, then called and spoke with Faith to inform tomotherapy  9:43 AM

## 2012-11-14 ENCOUNTER — Telehealth (HOSPITAL_COMMUNITY): Payer: Self-pay | Admitting: Oncology

## 2012-11-14 ENCOUNTER — Telehealth: Payer: Self-pay | Admitting: *Deleted

## 2012-11-14 ENCOUNTER — Ambulatory Visit
Admission: RE | Admit: 2012-11-14 | Discharge: 2012-11-14 | Disposition: A | Discharge status: Home / Self Care | Payer: BC Managed Care – PPO | Source: Ambulatory Visit | Attending: Radiation Oncology | Admitting: Radiation Oncology

## 2012-11-14 ENCOUNTER — Other Ambulatory Visit: Payer: Self-pay | Admitting: Radiation Oncology

## 2012-11-14 ENCOUNTER — Ambulatory Visit: Payer: BC Managed Care – PPO | Admitting: Radiation Oncology

## 2012-11-14 ENCOUNTER — Other Ambulatory Visit (HOSPITAL_COMMUNITY): Payer: Self-pay | Admitting: Oncology

## 2012-11-14 ENCOUNTER — Ambulatory Visit (HOSPITAL_COMMUNITY)
Admission: RE | Admit: 2012-11-14 | Discharge: 2012-11-14 | Disposition: A | Discharge status: Home / Self Care | Payer: BC Managed Care – PPO | Source: Ambulatory Visit | Attending: Oncology | Admitting: Oncology

## 2012-11-14 DIAGNOSIS — R05 Cough: Secondary | ICD-10-CM

## 2012-11-14 DIAGNOSIS — J029 Acute pharyngitis, unspecified: Secondary | ICD-10-CM

## 2012-11-14 DIAGNOSIS — R059 Cough, unspecified: Secondary | ICD-10-CM | POA: Insufficient documentation

## 2012-11-14 DIAGNOSIS — C8190 Hodgkin lymphoma, unspecified, unspecified site: Secondary | ICD-10-CM | POA: Insufficient documentation

## 2012-11-14 MED ORDER — HYDROCOD POLST-CHLORPHEN POLST 10-8 MG/5ML PO LQCR: 5.0000 mL | Freq: Two times a day (BID) | ORAL | Status: DC | PRN

## 2012-11-14 MED ORDER — AZITHROMYCIN 250 MG PO TABS: ORAL_TABLET | ORAL | Status: DC

## 2012-11-14 MED ORDER — PREDNISONE 10 MG PO TABS: 10.0000 mg | ORAL_TABLET | Freq: Two times a day (BID) | ORAL | Status: DC

## 2012-11-14 NOTE — Telephone Encounter (Signed)
[name] [name] called the clinic with medical complaints of coughing and fatigue.   I discussed the case with Radiation Oncologist, [name] [name].  I spoke with [name] [name] and she complains of a cough.  She wishes to blame this on radiation, but after discussing with Dr. Michell [name], this side effect is not expected from radiation.  She is unable to come to the clinic for a visit as this would interfere with radiation and/or her work schedule.  [name] [name] is noted to have a hoarse voice and has nearly lost her voice.  She reports that he cough is productive, but she has not noted the color of her sputum.  She denies any fevers or chills.  She reports that her cough is "deep."  She admits to a sore throat as well.   With this information, I have placed an order for a chest xray to evaluate for consolidation.  I have also escribed a Z-Pak and refilled her Tussionex.   She was advised to call us Monday if not feeling well.

## 2012-11-14 NOTE — Telephone Encounter (Signed)
Called patient home phone [phone], spoke with her and informed her of labs essentially normal,no anemia, and after Dr.Wentworth spoke with Dr. Dallas Schimke ,who thought you should go on prednisone , Dr.Wentoerth has put a RX for prednisone 10 mg oral 2x a day to [facility] in [place] to start  Today and Md will do a slow taper over several weeks, patient stated"I'm not so sure I want to go back on prednisone, I am coughing more and hear wheezing, , like last time I started this and ended up in the hospital, I think I will just call Dr. Jerelyn Scott office  today and see if I can get  CHEST XRAY", asked her to see me after rad tx today , if she couldn't get i to see Dr. Jerelyn Scott for xray, maybe our on call MD  Can see her , ,she stated she would, 10:14 AM

## 2012-11-14 NOTE — Telephone Encounter (Signed)
error 

## 2012-11-14 NOTE — Progress Notes (Signed)
Will call patient  later this morning, of results of her labs done yesterday and that rx fro prednisone waiting for her at her pharmacy at [facility] in [place],

## 2012-11-15 ENCOUNTER — Ambulatory Visit
Admission: RE | Admit: 2012-11-15 | Discharge: 2012-11-15 | Disposition: A | Discharge status: Home / Self Care | Payer: BC Managed Care – PPO | Source: Ambulatory Visit | Attending: Radiation Oncology | Admitting: Radiation Oncology

## 2012-11-15 NOTE — Progress Notes (Signed)
Spoke with patirn at tomotherapy  Area, she stated "she had the cxr and it was clean", smiling, said her voice was a little better, and that she in only taking the z-pack and Tussinex not Prednisone",informed MD 3:51 PM

## 2012-11-18 ENCOUNTER — Ambulatory Visit
Admission: RE | Admit: 2012-11-18 | Discharge: 2012-11-18 | Disposition: A | Discharge status: Home / Self Care | Payer: BC Managed Care – PPO | Source: Ambulatory Visit | Attending: Radiation Oncology | Admitting: Radiation Oncology

## 2012-11-19 ENCOUNTER — Encounter (HOSPITAL_COMMUNITY): Payer: BC Managed Care – PPO | Attending: Oncology

## 2012-11-19 ENCOUNTER — Ambulatory Visit
Admission: RE | Admit: 2012-11-19 | Discharge: 2012-11-19 | Disposition: A | Discharge status: Home / Self Care | Payer: BC Managed Care – PPO | Source: Ambulatory Visit | Attending: Radiation Oncology | Admitting: Radiation Oncology

## 2012-11-19 ENCOUNTER — Ambulatory Visit (HOSPITAL_COMMUNITY)
Admission: RE | Admit: 2012-11-19 | Discharge: 2012-11-19 | Disposition: A | Discharge status: Home / Self Care | Payer: BC Managed Care – PPO | Source: Ambulatory Visit | Attending: Hematology and Oncology | Admitting: Hematology and Oncology

## 2012-11-19 VITALS — BP 120/84 | HR 105 | Temp 98.4°F | Ht 66.0 in | Wt 207.1 lb

## 2012-11-19 VITALS — BP 124/92 | HR 92 | Temp 98.2°F | Resp 18

## 2012-11-19 DIAGNOSIS — J700 Acute pulmonary manifestations due to radiation: Secondary | ICD-10-CM

## 2012-11-19 DIAGNOSIS — R05 Cough: Secondary | ICD-10-CM | POA: Insufficient documentation

## 2012-11-19 DIAGNOSIS — Z79899 Other long term (current) drug therapy: Secondary | ICD-10-CM | POA: Insufficient documentation

## 2012-11-19 DIAGNOSIS — C8190 Hodgkin lymphoma, unspecified, unspecified site: Secondary | ICD-10-CM | POA: Insufficient documentation

## 2012-11-19 DIAGNOSIS — R0789 Other chest pain: Secondary | ICD-10-CM | POA: Insufficient documentation

## 2012-11-19 DIAGNOSIS — J029 Acute pharyngitis, unspecified: Secondary | ICD-10-CM | POA: Insufficient documentation

## 2012-11-19 DIAGNOSIS — R0609 Other forms of dyspnea: Secondary | ICD-10-CM | POA: Insufficient documentation

## 2012-11-19 DIAGNOSIS — R059 Cough, unspecified: Secondary | ICD-10-CM | POA: Insufficient documentation

## 2012-11-19 DIAGNOSIS — R0989 Other specified symptoms and signs involving the circulatory and respiratory systems: Secondary | ICD-10-CM | POA: Insufficient documentation

## 2012-11-19 DIAGNOSIS — C8292 Follicular lymphoma, unspecified, intrathoracic lymph nodes: Secondary | ICD-10-CM

## 2012-11-19 LAB — BASIC METABOLIC PANEL
BUN: 8 mg/dL (ref 6–23)
CO2: 28 mEq/L (ref 19–32)
Calcium: 9.4 mg/dL (ref 8.4–10.5)
Chloride: 101 mEq/L (ref 96–112)
Creatinine, Ser: 0.62 mg/dL (ref 0.50–1.10)
GFR calc Af Amer: >90 mL/min (ref >90)
GFR calc non Af Amer: >90 mL/min (ref >90)
Glucose, Bld: 109 mg/dL — ABNORMAL HIGH (ref 70–99)
Potassium: 3.7 mEq/L (ref 3.5–5.1)
Sodium: 138 mEq/L (ref 135–145)

## 2012-11-19 LAB — CBC WITH DIFFERENTIAL/PLATELET
Basophils Absolute: 0 10*3/uL (ref 0.0–0.1)
Basophils Relative: 0 % (ref 0–1)
Eosinophils Absolute: 0.1 10*3/uL (ref 0.0–0.7)
Eosinophils Relative: 6 % — ABNORMAL HIGH (ref 0–5)
HCT: 30.4 % — ABNORMAL LOW (ref 36.0–46.0)
Hemoglobin: 11.1 g/dL — ABNORMAL LOW (ref 12.0–15.0)
Lymphocytes Relative: 7 % — ABNORMAL LOW (ref 12–46)
Lymphs Abs: 0.2 10*3/uL — ABNORMAL LOW (ref 0.7–4.0)
MCH: 34.6 pg — ABNORMAL HIGH (ref 26.0–34.0)
MCHC: 36.5 g/dL — ABNORMAL HIGH (ref 30.0–36.0)
MCV: 94.7 fL (ref 78.0–100.0)
Monocytes Absolute: 0.2 10*3/uL (ref 0.1–1.0)
Monocytes Relative: 11 % (ref 3–12)
Neutro Abs: 1.6 10*3/uL — ABNORMAL LOW (ref 1.7–7.7)
Neutrophils Relative %: 75 % (ref 43–77)
Platelets: 133 10*3/uL — ABNORMAL LOW (ref 150–400)
RBC: 3.21 MIL/uL — ABNORMAL LOW (ref 3.87–5.11)
RDW: 14.2 % (ref 11.5–15.5)
WBC: 2.1 10*3/uL — ABNORMAL LOW (ref 4.0–10.5)

## 2012-11-19 MED ORDER — GUAIFENESIN-CODEINE 100-10 MG/5ML PO SYRP: 10.0000 mL | ORAL_SOLUTION | Freq: Four times a day (QID) | ORAL | Status: DC | PRN

## 2012-11-19 MED ORDER — PREDNISONE 50 MG PO TABS: ORAL_TABLET | ORAL | Status: DC

## 2012-11-19 MED ORDER — GUAIFENESIN-CODEINE 100-10 MG/5ML PO SOLN
10.0000 mL | Freq: Once | ORAL | Status: AC
Start: 2012-11-19 — End: 2012-11-19
  Administered 2012-11-19: 10 mL via ORAL
  Filled 2012-11-19: qty 10

## 2012-11-19 NOTE — Progress Notes (Addendum)
[facility]    Radiation Oncology [address] [place]     Maryln Gottron, M.D. [place], [place] [address]               Billie Lade, M.D., Ph.D. Phone: [phone]      Molli Hazard A. Kathrynn Running, M.D. Fax: [phone]      Radene Gunning, M.D., Ph.D.         Lurline Hare, M.D.         Grayland Jack, M.D Weekly Treatment Management Note  Name: [name] [name]     MRN: [id number]        CSN: [id number] Date: 11/19/2012      DOB: 01/04/1971  CC: [name], MD         [name]    Status: Outpatient  Diagnosis: The encounter diagnosis was Hodgkin's lymphoma.  Current Dose: 32 Gy  Current Fraction: 16  Planned Dose: 36 Gy  Narrative: Holland Falling was seen today for weekly treatment management. The chart was checked and MVCT  were reviewed. Patient is having a lot of symptoms such as pharyngitis and hoarseness. She has been having a dry irritating cough. Patient was seen by medical oncology earlier today and a chest CT scan was performed to rule out radiation pneumonitis. This did not show signs of pneumonitis. Patient was placed on prednisone and Robitussin-AC.  Patient is also having a lot of itching in the upper chest and neck region. The patient has tried Aloe Vera gel as well as our radiation creams. She has also tried hydrocortisone cream. Recommended patient try Benadryl cream.  Oral Benadryl does help with her symptoms but this makes her sleepy and she continues to work during the daytime.  Other and Sulfur  Current Outpatient Prescriptions  Medication Sig Dispense Refill  . acyclovir (ZOVIRAX) 400 MG tablet Take 1 tablet (400 mg total) by mouth 2 (two) times daily. Take throughout chemo.Take 400 mg by mouth 2 (two) times daily. Take throughout chemo.  90 tablet  0  . ALPRAZolam (XANAX) 1 MG tablet Take 0.5 mg by mouth 4 (four) times daily as needed for anxiety.      Marland Kitchen azithromycin (ZITHROMAX) 250 MG tablet Take as directed  6 each  0  .  chlorpheniramine-HYDROcodone (TUSSIONEX) 10-8 MG/5ML LQCR Take 5 mLs by mouth every 12 (twelve) hours as needed. cough  200 mL  0  . cholecalciferol (VITAMIN D) 1000 UNITS tablet Take 1,000 Units by mouth daily.      Marland Kitchen emollient (BIAFINE) cream Apply 1 application topically 2 (two) times daily. Apply after rad tx and in am, not 4 hours prior to rad txs daily      . gabapentin (NEURONTIN) 600 MG tablet Take 600 mg by mouth at bedtime.       Marland Kitchen guaiFENesin-codeine (ROBITUSSIN AC) 100-10 MG/5ML syrup Take 10 mLs by mouth 4 (four) times daily as needed for cough.  120 mL  1  . hydrochlorothiazide (HYDRODIURIL) 25 MG tablet Take 25 mg by mouth daily. To take 1 daily as needed for swelling      . HYDROcodone-acetaminophen (HYCET) 7.5-325 mg/15 ml solution Take 15 mLs by mouth 4 (four) times daily as needed for pain.  473 mL  0  . Ibuprofen-Diphenhydramine Cit (ADVIL PM PO) Take 2 tablets by mouth as needed.      Marland Kitchen KRILL OIL 1000 MG CAPS Take 1 capsule by mouth daily.       Marland Kitchen lisinopril-hydrochlorothiazide (PRINZIDE,ZESTORETIC)  20-12.5 MG per tablet Take 1 tablet by mouth daily.      Marland Kitchen LORazepam (ATIVAN) 1 MG tablet Take 1 tablet by mouth at bedtime as needed. For nausea      . methocarbamol (ROBAXIN) 500 MG tablet Take 1 tablet (500 mg total) by mouth 2 (two) times daily. Refill given on 08/30/12 per Dr. Mariel Sleet.  60 tablet  0  . norethindrone-ethinyl estradiol (JUNEL FE,GILDESS FE,LOESTRIN FE) 1-20 MG-MCG tablet Take 1 tablet by mouth daily.      . potassium chloride (K-DUR,KLOR-CON) 10 MEQ tablet Take 10 mEq by mouth 2 (two) times daily.       . prednisoLONE acetate (PRED FORTE) 1 % ophthalmic suspension Place 1 drop into both eyes 3 (three) times daily.  5 mL  0  . predniSONE (DELTASONE) 10 MG tablet Take 1 tablet (10 mg total) by mouth 2 (two) times daily.  50 tablet  1  . predniSONE (DELTASONE) 50 MG tablet Take one tab daily in am.  7 tablet  0  . tiZANidine (ZANAFLEX) 4 MG tablet Take 4-6 mg by  mouth every 6 (six) hours as needed. Muscle spasms.       No current facility-administered medications for this encounter.   Facility-Administered Medications Ordered in Other Encounters  Medication Dose Route Frequency Provider Last Rate Last Dose  . sodium chloride 0.9 % injection 10 mL  10 mL Intravenous PRN Randall An, MD   10 mL at 10/03/12 1643   Labs:  Lab Results  Component Value Date   WBC 2.1* 11/19/2012   HGB 11.1* 11/19/2012   HCT 30.4* 11/19/2012   MCV 94.7 11/19/2012   PLT 133* 11/19/2012   Lab Results  Component Value Date   CREATININE 0.62 11/19/2012   BUN 8 11/19/2012   NA 138 11/19/2012   K 3.7 11/19/2012   CL 101 11/19/2012   CO2 28 11/19/2012   Lab Results  Component Value Date   ALT 22 11/13/2012   AST 27 11/13/2012   BILITOT 0.88 11/13/2012    Physical Examination:  height is 5\' 6"  (1.676 m) and weight is 207 lb 1.6 oz (93.94 kg). Her temperature is 98.4 F (36.9 C). Her blood pressure is 120/84 and her pulse is 105.    Wt Readings from Last 3 Encounters:  11/19/12 207 lb 1.6 oz (93.94 kg)  11/12/12 207 lb 12.8 oz (94.257 kg)  11/11/12 209 lb 3.2 oz (94.892 kg)    The oral cavity is moist without secondary infection. The skin of the lower neck and upper chest area shows brisk erythema without moist desquamation. Lungs - Normal respiratory effort, chest expands symmetrically. Lungs are clear to auscultation, no crackles or wheezes.  Heart has regular rhythm and rate  Abdomen is soft and non tender with normal bowel sounds  Assessment:  Patient is having significant side effects with her treatments as above.  Plan: Continue treatment per original radiation prescription.  She will complete her radiation therapy in 2 days.

## 2012-11-19 NOTE — Progress Notes (Signed)
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   ONCOLOGIC HISTORY:[name] [name] is a 42 year old woman diagnosed with nodular sclerosing Hodgkin's lymphoma following biopsy of a interior mediastinal mass on 03/05/2012. According Dr. Maxton Papas note;bulky mediastinal nodular sclerosing Hodgkin's disease presenting with a large anterior mediastinal mass as well as left cervical and right cervical lymph nodes status post 5 cycles of ABVD but with incomplete response after cycles 3 and after cycles 5. She was then switched to dose adjusted BEACOPP x2 cycles. Post treatment PET shows complete resolution of activity in the mediastinal mass.chemotherapy was completed on 09/25/2012. Subsequently patient began radiation in May 2014 and has continued radiation to date.  She states that she has 3 more days left.    INTERVAL HISTORY:   [name] [name] 42 y.o. female returns to the clinic today for an unscheduled follow up visit for complaint of intractable cough, sore throat and change in voice quality( hoarseness) .  She tells me that cough began after starting radiation and has continued to date.  She coughs uncontrollably, cough is nonproductive presently but reportedly was productive of urinary sputum during the time she was being treated with Z-Pak which she has just finished. On review of her records, patient was recently given Z-Pak according to Asher Muir PA-C notes on 11/14/2012 and a chest x-ray was ordered which did not show any acute cardiopulmonary process.  She tells me that she felt somewhat better after completing Z-Pak yesterday but cough persists and is intractable, mostly dry now.  She denies fever or chills no chest pain or worsening shortness of breath.  She states also that she coughs  uncontrollably when she gets pentamidine treatment  and reportedly her last treatment was on 5/16.  She denies night sweats.  He reports decreased appetite and skin changes related to radiation.  Radiologic imaging is pertaining to Hodgkin's lymphoma been extensively reviewed by me.  MEDICAL HISTORY: Past Medical History  Diagnosis Date  . Fibromyalgia   . Eczema   . Depression     parents died close together "was hard"  . Restless leg   . Heart murmur     Told 'nothing to worry about"  No 2D Echo  . Sleep apnea     Sleep Study- 1996- "little apnea- no tx"  . Headache(784.0)     Birth control pills help  . Hemorrhoid   . Anemia     As a child and while pregnant  . 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 Hypertension     recently dx with hodgkins  . Dyspnea     all the time .".due to the mass"  . Pneumonia April 2013  . CHF (congestive heart failure)     pt stated it was due to the CA but after chemo began it went away  . Dry skin     ALLERGIES:  is allergic to other and sulfur.  MEDICATIONS:  Current Outpatient Prescriptions  Medication Sig Dispense Refill  . acyclovir (ZOVIRAX) 400 MG tablet Take 1 tablet (400 mg total) by mouth 2 (two) times daily. Take throughout chemo.Take 400 mg by mouth 2 (two) times daily. Take throughout chemo.  90 tablet  0  . ALPRAZolam (XANAX) 1 MG tablet Take 0.5 mg  by mouth 4 (four) times daily as needed for anxiety.      Marland Kitchen azithromycin (ZITHROMAX) 250 MG tablet Take as directed  6 each  0  . chlorpheniramine-HYDROcodone (TUSSIONEX) 10-8 MG/5ML LQCR Take 5 mLs by mouth every 12 (twelve) hours as needed. cough  200 mL  0  . cholecalciferol (VITAMIN D) 1000 UNITS tablet Take 1,000 Units by mouth daily.      Marland Kitchen emollient (BIAFINE) cream Apply 1 application topically 2 (two) times daily. Apply after rad tx and in am, not 4 hours prior to rad txs daily      . gabapentin (NEURONTIN) 600 MG tablet Take 600 mg by mouth at  bedtime.       Marland Kitchen guaiFENesin-codeine (ROBITUSSIN AC) 100-10 MG/5ML syrup Take 10 mLs by mouth 4 (four) times daily as needed for cough.  120 mL  1  . hydrochlorothiazide (HYDRODIURIL) 25 MG tablet Take 25 mg by mouth daily. To take 1 daily as needed for swelling      . HYDROcodone-acetaminophen (HYCET) 7.5-325 mg/15 ml solution Take 15 mLs by mouth 4 (four) times daily as needed for pain.  473 mL  0  . Ibuprofen-Diphenhydramine Cit (ADVIL PM PO) Take 2 tablets by mouth as needed.      Marland Kitchen KRILL OIL 1000 MG CAPS Take 1 capsule by mouth daily.       Marland Kitchen lisinopril-hydrochlorothiazide (PRINZIDE,ZESTORETIC) 20-12.5 MG per tablet Take 1 tablet by mouth daily.      Marland Kitchen LORazepam (ATIVAN) 1 MG tablet Take 1 tablet by mouth at bedtime as needed. For nausea      . methocarbamol (ROBAXIN) 500 MG tablet Take 1 tablet (500 mg total) by mouth 2 (two) times daily. Refill given on 08/30/12 per Dr. Mariel Sleet.  60 tablet  0  . norethindrone-ethinyl estradiol (JUNEL FE,GILDESS FE,LOESTRIN FE) 1-20 MG-MCG tablet Take 1 tablet by mouth daily.      . potassium chloride (K-DUR,KLOR-CON) 10 MEQ tablet Take 10 mEq by mouth 2 (two) times daily.       . prednisoLONE acetate (PRED FORTE) 1 % ophthalmic suspension Place 1 drop into both eyes 3 (three) times daily.  5 mL  0  . predniSONE (DELTASONE) 10 MG tablet Take 1 tablet (10 mg total) by mouth 2 (two) times daily.  50 tablet  1  . tiZANidine (ZANAFLEX) 4 MG tablet Take 4-6 mg by mouth every 6 (six) hours as needed. Muscle spasms.       Current Facility-Administered Medications  Medication Dose Route Frequency Provider Last Rate Last Dose  . guaiFENesin-codeine 100-10 MG/5ML solution 10 mL  10 mL Oral Once Sherral Hammers, MD       Facility-Administered Medications Ordered in Other Visits  Medication Dose Route Frequency Provider Last Rate Last Dose  . sodium chloride 0.9 % injection 10 mL  10 mL Intravenous PRN Randall An, MD   10 mL at 10/03/12 1643    SURGICAL  HISTORY:  Past Surgical History  Procedure Laterality Date  . Wisdom tooth extraction    . Dilation and curettage of uterus  1991  . Mediastinal mass biopsy  03/05/12  . Bone marrow biopsy  03/20/2012  . Portacath placement  03/26/2012    Procedure: INSERTION PORT-A-CATH;  Surgeon: Loreli Slot, MD;  Location: [facility] OR;  Service: Thoracic;  Laterality: N/A;  . Port-a-cath removal Left 10/16/2012    Procedure: REMOVAL PORT-A-CATH;  Surgeon: Loreli Slot, MD;  Location: [facility] OR;  Service: Thoracic;  Laterality: Left;     REVIEW OF SYSTEMS: 14 point review of system is as in the history above otherwise negative.    PHYSICAL EXAMINATION:  Blood pressure 124/92, pulse 92, temperature 98.2 F (36.8 C), temperature source Oral, resp. rate 18. GENERAL: Episodic coughing spells, obese. SKIN: erythema mostly around the anterior chest wall skin. HEAD: Normocephalic, No masses, lesions, tenderness or abnormalities  EYES: Conjunctiva are pink and non-injected  ENT: External ears normal ,lips, buccal mucosa, and tongue normal and mucous membranes are moist, mild oropharyngeal erythema and no exudate. LYMPH: No palpable lymphadenopathy, in the neck, axilla or supraclavicular areas.  LUNGS: Mostly clear to auscultation , no crackles or wheezes, coarse breath sounds on the right side around the base. HEART: regular rate & rhythm, no murmurs, no gallops, S1 normal and S2 normal  ABDOMEN: Abdomen soft, non-tender, normal bowel sounds, no masses or organomegaly and no hepatosplenomegaly palpable. EXTREMITIES: No edema, no skin discoloration or tenderness NEURO: alert & oriented , no focal motor/sensory deficits.     LABORATORY DATA: Lab Results  Component Value Date   WBC 2.5* 11/13/2012   HGB 10.5* 11/13/2012   HCT 29.6* 11/13/2012   MCV 97.6 11/13/2012   PLT 146 11/13/2012      Chemistry      Component Value Date/Time   NA 136 11/13/2012 1552   NA 136 10/18/2012 0838   K 3.6 11/13/2012 1552    K 3.7 10/18/2012 0838   CL 101 11/13/2012 1552   CL 97 10/18/2012 0838   CO2 27 11/13/2012 1552   CO2 27 10/18/2012 0838   BUN 20.4 11/13/2012 1552   BUN 14 10/18/2012 0838   CREATININE 1.0 11/13/2012 1552   CREATININE 0.69 10/18/2012 0838      Component Value Date/Time   CALCIUM 9.0 11/13/2012 1552   CALCIUM 9.5 10/18/2012 0838   ALKPHOS 62 11/13/2012 1552   ALKPHOS 70 10/18/2012 0838   AST 27 11/13/2012 1552   AST 29 10/18/2012 0838   ALT 22 11/13/2012 1552   ALT 27 10/18/2012 0838   BILITOT 0.88 11/13/2012 1552   BILITOT 0.5 10/18/2012 0838       RADIOGRAPHIC STUDIES: Dg Chest 2 View  11/14/2012   *RADIOLOGY REPORT*  Clinical Data: Cough, sore throat, Hodgkin's lymphoma  CHEST - 2 VIEW  Comparison: 10/18/2012  Findings: Lungs are clear. No pleural effusion or pneumothorax.  The heart is top normal in size.  Known anterior mediastinal mass is not radiographically evident. Surgical clips in the left suprahilar region.  Visualized osseous structures are within normal limits.  IMPRESSION: No evidence of acute cardiopulmonary disease.  Known anterior mediastinal mass is not radiographically evident.   Original Report Authenticated By: Charline Bills, M.D.     ASSESSMENT:  [name]. Bradshaw has intractable cough with sore throat while undergoing radiation for stage II bulky Hodgkin's lymphoma treated with 5 cycles of ABVD, 2 cycles of be a BEACOPP, thoracic irradiation which she is to currently undergoing. She had somewhat of a relief from a course of Z-Pak.  I'm suspicious of radiation-induced pneumonitis given her history.  This may not show chest x-ray and a CT scan of the chest will be helpful.  She appears to have achieved completed response after chemotherapy treatment.  PLAN:  1. Have ordered a CT of the chest without contrast to evaluate for radiation pneumonitis.  If this proves to be the case treatment with corticosteroid will be relevant.  At present there doesn't appear to be any  convincing evidence of infection  at least clinically. 2. I prescribed Robitussin with codeine for relief of cough. 3. I ordered a CBC and BMP and I will follow up with the results 4. I scheduled return visit in 1 week to see how patient is doing, in the meantime I instructed her to call if she feels worse at any time for sooner appointment and evaluation.    All questions were satisfactorily answered.She knows to call if she has any concern.  I spent 25 minutes counseling the patient face to face. The total time spent in the appointment was 40 minutes.   Sherral Hammers, MD FACP. Hematology/Oncology.      Addendum:Chest CT results showed no evidence of radiation pneumonitis.There is significant Significant decrease in size of the anterior mediastinal mass when compared to the CT from September 2013 .  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  I prescribed 1 week course of steroid 50 mg of prednisone daily for one week in addition to Robitussin-AC.  I had given her short duration of steroid course due to lack of evidence of pneumonitis.  The plan is for patient to return to clinic in 1 week to evaluate how she is doing, if she does not improve I explained to her that bronchoscopy with possible BAL may be indicated.  She agrees with this plan.  She knows to call if her symptoms does not improve or gets worse at any time.

## 2012-11-19 NOTE — Progress Notes (Signed)
[name] [name] presented for labwork. Labs per MD order drawn via Peripheral Line 23 gauge needle inserted in left AC  Good blood return present. Procedure without incident.  Needle removed intact. Patient tolerated procedure well.   

## 2012-11-19 NOTE — Patient Instructions (Addendum)
[facility] Discharge Instructions  RECOMMENDATIONS MADE BY THE CONSULTANT AND ANY TEST RESULTS WILL BE SENT TO YOUR REFERRING PHYSICIAN.  EXAM FINDINGS BY THE PHYSICIAN TODAY AND SIGNS OR SYMPTOMS TO REPORT TO CLINIC OR PRIMARY PHYSICIAN: Exam and discussion by Dr. Sharia Reeve.  Thinks that you may have radiation pneumonitis.  MEDICATIONS PRESCRIBED:  Prednisone 50 mg daily for 7 days. Robitussin AC 10 ml every 4 hours as needed for cough.  INSTRUCTIONS GIVEN AND DISCUSSED: If cough worsens or you start running a fever let us know.  SPECIAL INSTRUCTIONS/FOLLOW-UP: Follow-up in a week.  Thank you for choosing [name] to provide your oncology and hematology care.  To afford each patient quality time with our providers, please arrive at least 15 minutes before your scheduled appointment time.  With your help, our goal is to use those 15 minutes to complete the necessary work-up to ensure our physicians have the information they need to help with your evaluation and healthcare recommendations.    Effective January 1st, 2014, we ask that you re-schedule your appointment with our physicians should you arrive 10 or more minutes late for your appointment.  We strive to give you quality time with our providers, and arriving late affects you and other patients whose appointments are after yours.    Again, thank you for choosing [facility].  Our hope is that these requests will decrease the amount of time that you wait before being seen by our physicians.       _____________________________________________________________  Should you have questions after your visit to [facility], please contact our office at [phone] between the hours of 8:30 a.m. and 5:00 p.m.  Voicemails left after 4:30 p.m. will not be returned until the following business day.  For prescription refill requests, have your pharmacy contact our office with your  prescription refill request.

## 2012-11-19 NOTE — Progress Notes (Signed)
Mr. Stiver has received 16 fractions to her chest and neck.  She is hoarse today and c/o sore throat and grades this pain as a level 4 on a scale of 0-10.  She has a slightly moist cough and she reports her cough was productive last week and she was placed on a Z-Pak last week from Dr. Midgett Papas partner.   She is afebrile today.  Bright rash-like erythema on the upper chest reion and upper back.  She states no relief with hydrocortisone cream, but takes Benadryl at night and she does not have itching while sleeping.

## 2012-11-20 ENCOUNTER — Telehealth: Payer: Self-pay | Admitting: *Deleted

## 2012-11-20 ENCOUNTER — Encounter: Payer: Self-pay | Admitting: Radiation Oncology

## 2012-11-20 ENCOUNTER — Ambulatory Visit
Admission: RE | Admit: 2012-11-20 | Discharge: 2012-11-20 | Disposition: A | Discharge status: Home / Self Care | Payer: BC Managed Care – PPO | Source: Ambulatory Visit | Attending: Radiation Oncology | Admitting: Radiation Oncology

## 2012-11-20 NOTE — Telephone Encounter (Signed)
Called patient to check on her status, patient barely whispering,stated Dr.MAnning had called her after speaking with Faith,therapist, patient had asked if she could stop the last 6 treatments what her prognosis would be, she can't ly down and stop coughing,  she had spoken with Dr.Manning and will come in today and try and get treatment, asked if she had tried cough syrup or cough drops"they don't help, but, I'll come in and try "called Faith,therapist and gave status 2:46 PM

## 2012-11-21 ENCOUNTER — Telehealth: Payer: Self-pay | Admitting: *Deleted

## 2012-11-21 ENCOUNTER — Ambulatory Visit: Payer: BC Managed Care – PPO

## 2012-11-21 NOTE — Telephone Encounter (Signed)
Spoke with patient@[phone] @ 1608pm," she isn't coming in today has decided to stop altogether after speaking with MD  Yesterday, ,whispering," please just mail me a follow up appt card with Dr.Wenworth", will get that in the mail tomorrow and will let linac 4  Therapist Shannon know,4:10 PM

## 2012-11-22 ENCOUNTER — Ambulatory Visit: Payer: BC Managed Care – PPO

## 2012-11-24 NOTE — Progress Notes (Signed)
°  Radiation Oncology         [phone] ________________________________  Name: [name] [name] MRN: [id number]  Date: 11/20/2012  DOB: 11/21/1970  End of Treatment Note  Diagnosis:   Stage IIB Hodgkin's lymphoma     Indication for treatment:  Curative       Radiation treatment dates:  10/28/2012-11/20/2012  Site/dose:   Chest and neck / 34 Gy in 17 fractions  Beams/energy:   Helical tomotherapy with 6 MV photons   Narrative: The patient tolerated radiation treatment with significant side effects.  She experienced extreme fatigue and cough. A CT of the chest obtianed by [facility] was negative for pneumonitis or acute processes.  She was not profoundly anemic.  She continued to work and drive herself to treatment.  Her skin was irritated.   Plan: The patient has completed radiation treatment. The patient will return to radiation oncology clinic for routine followup in one month. I advised them to call or return sooner if they have any questions or concerns related to their recovery or treatment. She has regular scheduled follow up with medical oncology as well.   ------------------------------------------------  Lurline Hare, MD

## 2012-11-25 ENCOUNTER — Ambulatory Visit: Payer: BC Managed Care – PPO

## 2012-11-26 ENCOUNTER — Ambulatory Visit (HOSPITAL_COMMUNITY): Payer: BC Managed Care – PPO

## 2012-11-26 ENCOUNTER — Other Ambulatory Visit (HOSPITAL_COMMUNITY): Payer: Self-pay | Admitting: Oncology

## 2012-11-26 ENCOUNTER — Ambulatory Visit: Payer: BC Managed Care – PPO

## 2012-11-26 DIAGNOSIS — R0600 Dyspnea, unspecified: Secondary | ICD-10-CM

## 2012-11-27 ENCOUNTER — Other Ambulatory Visit (HOSPITAL_COMMUNITY): Payer: Self-pay | Admitting: Oncology

## 2012-11-27 ENCOUNTER — Ambulatory Visit: Payer: BC Managed Care – PPO

## 2012-11-27 DIAGNOSIS — C8190 Hodgkin lymphoma, unspecified, unspecified site: Secondary | ICD-10-CM

## 2012-11-27 NOTE — Progress Notes (Signed)
[name] Pizza, MD  [address]  [place] [address]  Hodgkin's lymphoma - Plan: CBC with Differential, Comprehensive metabolic panel, Lactate dehydrogenase, Sedimentation rate, EKG 12-Lead, EKG 12-Lead  Chest pressure - Plan: CBC with Differential, Comprehensive metabolic panel, Lactate dehydrogenase, Sedimentation rate, EKG 12-Lead, EKG 12-Lead  Cough - Plan: chlorpheniramine-HYDROcodone (TUSSIONEX) 10-8 MG/5ML LQCR  Sore throat - Plan: chlorpheniramine-HYDROcodone (TUSSIONEX) 10-8 MG/5ML LQCR  CURRENT THERAPY:  INTERVAL HISTORY: [name] [name] 42 y.o. 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 She was then switched to dose adjusted BEACOPP x2 cycles. Her PET scan after the second cycle showed complete remission. Because of probable inflammatory changes in the lungs and a prior Citrobacter blood stream/pulmonary infection felt to be the cause of the PET scan findings in the lungs she has had Levaquin which she is now finished. Now S/P involved field radiation by Dr. Lurline Hare (Rad Onc) finishing on 11/20/2012.  Dr. Michell Heinrich contacted the clinic on 11/26/2012 and I spoke to her personally.  She has reviewed the patient's chart and recent CT scan and reports that the signs and symptoms that the patient is complaining about and reporting are not related to radiation pneumonitis as this is too early for this diagnosis to occur.  She reports that radiation pneumonitis usually occurs 4-12 weeks out from radiation, but closer to 8 weeks.  She is only a few days out from completion of radiation therapy.  Recent CT scan shows mediastinal fullness and this is not a surprise to [place] as she had a large mass. Sometimes  this fullness never resolves, but remains metabolically inactive and therefore not an issue. Dr. Michell Heinrich mentions that we should maintain Bleomycin toxicity on our differential.  With this information, I contact Dr. Mariel Sleet and presented the case to him. He does not suspect that her signs and symptoms are due to Bleomycin toxicity/reaction since she did not receive much of the medication, but we will rule this out.    I will order a full PFT study for tomorrow with and without bronchodilator and ABG testing if able.  This will occur at [facility] at 2:30 PM.  I will contact Dr. Delton Coombes [phone]) and request this be read by end of day Friday if able.    If PFT is negative, I will present the case to Dr. [name], the patient's PCP, and see if he has any input on this.  What the patient is experiencing is not typical and exactly what is going on has not fully presented itself.   In conversation, the patient reports that her chest feels as though there is pressure. She reports that she thinks it's secondary to some inflammation do to radiation to her mediastinal mass. May be the case, but it is important to rule out any cardiac origin of this, and therefore we'll perform an EKG today.  She continues to have a cough that is productive of milky sputum. I've asked her to explain this in more detail, she reports that it is like coffee creamer. She denies any fevers and she does not look toxic today.  So this patient has a very confusing picture we will rule out the mycin toxicity with PFTs and ABGs  tomorrow. I will speak to the reading physician of this test to present the patient's case to him or her.  She otherwise denies any complaints. She states, "she has never prayed harder to die than I have over the past 3 weeks."  Will defer any restaging scans and studies at this time until we are able to get her over this present consultation.   Past Medical History  Diagnosis Date  . Fibromyalgia   .  Eczema   . Depression     parents died close together "was hard"  . Restless leg   . Heart murmur     Told 'nothing to worry about"  No 2D Echo  . Sleep apnea     Sleep Study- 1996- "little apnea- no tx"  . Headache(784.0)     Birth control pills help  . Hemorrhoid   . Anemia     As a child and while pregnant  . 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 Hypertension     recently dx with hodgkins  . Dyspnea     all the time .".due to the mass"  . Pneumonia April 2013  . CHF (congestive heart failure)     pt stated it was due to the CA but after chemo began it went away  . Dry skin     has Fibromyalgia; Eczema; Hypertension; Dyspnea; Depression; Restless leg; Mediastinal mass; Hodgkin's lymphoma; Neutropenia with fever; Citrobacter infection; Bacteremia; Antineoplastic chemotherapy induced pancytopenia; and Hypokalemia on her problem list.     is allergic to other and sulfur.  [name] [name] had no medications administered during this visit.  Past Surgical History  Procedure Laterality Date  . Wisdom tooth extraction    . Dilation and curettage of uterus  1991  . Mediastinal mass biopsy  03/05/12  . Bone marrow biopsy  03/20/2012  . Portacath placement  03/26/2012    Procedure: INSERTION PORT-A-CATH;  Surgeon: Loreli Slot, MD;  Location: [facility] OR;  Service: Thoracic;  Laterality: N/A;  . Port-a-cath removal Left 10/16/2012    Procedure: REMOVAL PORT-A-CATH;  Surgeon: Loreli Slot, MD;  Location: [facility] OR;  Service: Thoracic;  Laterality: Left;    Denies any headaches, dizziness, double vision, fevers, chills, night sweats, nausea, vomiting, diarrhea, constipation, chest pain, heart palpitations, shortness of breath, blood in stool, black tarry stool, urinary pain, urinary burning, urinary frequency, hematuria.   PHYSICAL EXAMINATION  ECOG PERFORMANCE STATUS: 2 - Symptomatic, <50% confined to bed  Filed Vitals:   11/28/12 1009  BP: 107/77   Pulse: 91  Temp: 98.7 F (37.1 C)  Resp: 18    GENERAL:alert, no distress, well nourished, well developed, comfortable and cooperative SKIN: skin color, texture, turgor are normal, no rashes or significant lesions, erythema in radiation field with mild dry desquamation HEAD: Normocephalic, No masses, lesions, tenderness or abnormalities EYES: normal, PERRLA, EOMI, Conjunctiva are pink and non-injected EARS: External ears normal OROPHARYNX:mucous membranes are moist  NECK: supple, trachea midline LYMPH:  no palpable lymphadenopathy BREAST:not examined LUNGS: clear to auscultation  HEART: regular rate & rhythm, no murmurs, no gallops, S1 normal and S2 normal ABDOMEN:abdomen soft, non-tender and normal bowel sounds BACK: Back symmetric, no curvature. EXTREMITIES:less then 2 second capillary refill, no joint deformities, effusion, or inflammation, no edema, no skin discoloration, no clubbing, no cyanosis  NEURO: alert & oriented x 3 with fluent speech, no focal motor/sensory deficits, gait normal   LABORATORY DATA: CBC  Component Value Date/Time   WBC 3.8* 11/28/2012 1114   WBC 2.5* 11/13/2012 1552   RBC 3.60* 11/28/2012 1114   RBC 3.04* 11/13/2012 1552   HGB 12.4 11/28/2012 1114   HGB 10.5* 11/13/2012 1552   HCT 35.2* 11/28/2012 1114   HCT 29.6* 11/13/2012 1552   PLT 216 11/28/2012 1114   PLT 146 11/13/2012 1552   MCV 97.8 11/28/2012 1114   MCV 97.6 11/13/2012 1552   MCH 34.4* 11/28/2012 1114   MCH 34.7* 11/13/2012 1552   MCHC 35.2 11/28/2012 1114   MCHC 35.6 11/13/2012 1552   RDW 14.3 11/28/2012 1114   RDW 16.3* 11/13/2012 1552   LYMPHSABS 0.3* 11/28/2012 1114   LYMPHSABS 0.1* 11/13/2012 1552   MONOABS 0.4 11/28/2012 1114   MONOABS 0.4 11/13/2012 1552   EOSABS 0.1 11/28/2012 1114   EOSABS 0.1 11/13/2012 1552   BASOSABS 0.0 11/28/2012 1114   BASOSABS 0.0 11/13/2012 1552      Chemistry      Component Value Date/Time   NA 135 11/28/2012 1114   NA 136 11/13/2012 1552   K 3.6 11/28/2012 1114   K 3.6  11/13/2012 1552   CL 96 11/28/2012 1114   CL 101 11/13/2012 1552   CO2 30 11/28/2012 1114   CO2 27 11/13/2012 1552   BUN 14 11/28/2012 1114   BUN 20.4 11/13/2012 1552   CREATININE 0.82 11/28/2012 1114   CREATININE 1.0 11/13/2012 1552      Component Value Date/Time   CALCIUM 9.6 11/28/2012 1114   CALCIUM 9.0 11/13/2012 1552   ALKPHOS 50 11/28/2012 1114   ALKPHOS 62 11/13/2012 1552   AST 17 11/28/2012 1114   AST 27 11/13/2012 1552   ALT 24 11/28/2012 1114   ALT 22 11/13/2012 1552   BILITOT 0.3 11/28/2012 1114   BILITOT 0.88 11/13/2012 1552       RADIOGRAPHIC STUDIES:  11/19/2012  *RADIOLOGY REPORT*  Clinical Data: Undergoing radiation therapy for Hodgkin's lymphoma,  cough, possible radiation pneumonitis  CT CHEST WITHOUT CONTRAST  Technique: Multidetector CT imaging of the chest was performed  following the standard protocol without IV contrast.  Comparison: Chest x-ray of 11/14/2012 and CT chest of 02/26/2012  Findings: On the lung window images, no lung parenchymal infiltrate  is seen. No effusion is noted. No suspicious lung nodule is  noted.  On soft tissue window images, the anterior mediastinal mass  previously described is more difficult to assess on this unenhanced  study, but it has significantly decreased in size. Lying just  anterior to the aortic arch, the remaining soft tissue measures 2.1  x 6.1 cm in its largest axial plane, with a height of approximately  5.9 cm. On the CT from September 2013, this anterior mediastinal  mass measured 8.3 x 12.1 x 6.7 cm at that time. At the time of the  PET CT in April 2014 , this mass measured 6.2 x 2.5 cm. No new or  enlarging adenopathy is seen. Images through the upper abdomen  show high attenuation within the gallbladder suspicious for  gallstones.  IMPRESSION:  1. No evidence of radiation pneumonitis. No infiltrate or  effusion.  2. Significant decrease in size of the anterior mediastinal mass  when compared to the CT from September 2013  as described above.  3. Probable gallstones.  Original Report Authenticated By: Dwyane Dee, M.D.     ASSESSMENT:  1. Stage II A. bulky mediastinal nodular sclerosing Hodgkin's disease presenting with a large anterior mediastinal mass as well  as left cervical and right cervical lymph nodes status post 5 cycles of ABVD but with incomplete response evaluated after cycle 3 and after cycle #5. She was then switched to dose adjusted BEACOPP x2 cycles. Her PET scan after the second cycle showed complete remission. Because of probable inflammatory changes in the lungs and a prior Citrobacter blood stream/pulmonary infection felt to be the cause of the PET scan findings in the lungs she has had Levaquin which she is now finished. Now S/P involved field radiation by Dr. Lurline Hare (Rad Onc) finishing on 11/20/2012. 2. Respiratory issues 3. Fatigue/Weakness  Patient Active Problem List   Diagnosis Date Noted  . Citrobacter infection 09/30/2012  . Bacteremia 09/30/2012  . Antineoplastic chemotherapy induced pancytopenia 09/30/2012  . Hypokalemia 09/30/2012  . Neutropenia with fever 09/27/2012  . Hodgkin's lymphoma 03/20/2012  . Fibromyalgia 02/29/2012  . Eczema   . Hypertension   . Dyspnea   . Depression   . Restless leg   . Mediastinal mass 02/26/2012    Class: Diagnosis of      PLAN:  1. I personally reviewed and went over laboratory results with the patient. 2. I personally reviewed and went over radiographic studies with the patient. 3. Will order full PFTs + ABGs tomorrow. This is scheduled for [facility] tomorrow at 2:30 PM.  I placed a call to the reading pulmonologist to present the patient's case to him/her.  Pager number given for a return telephone call. 4. Labs today: CBC diff, CMET, LDH, ESR 5. Refill of Tussionex 6. EKG today 7. Return in 2 weeks for follow-up    THERAPY PLAN:  [name] [name] has completed all therapy at this time and is presumed to be in a remission.  She  will require future restaging imaging, but for the time being, we need to focus on the patient's present issues.  We will rule out bleomycin toxicity with PFT and ABGs tomorrow.   All questions were answered. The patient knows to call the clinic with any problems, questions or concerns. We can certainly see the patient much sooner if necessary.  Patient and plan will be discussed with Dr. Mariel Sleet within the next 24 hours.   Julanne Schlueter  ADDENDUM:  EKG is unimpressive.  NSR with a rate of 91  I have not heard back from physician who will read PFTs tomorrow. I will try again tomorrow.  Olvin Rohr

## 2012-11-28 ENCOUNTER — Telehealth: Payer: Self-pay

## 2012-11-28 ENCOUNTER — Other Ambulatory Visit: Payer: Self-pay

## 2012-11-28 ENCOUNTER — Encounter (HOSPITAL_BASED_OUTPATIENT_CLINIC_OR_DEPARTMENT_OTHER): Payer: BC Managed Care – PPO | Admitting: Oncology

## 2012-11-28 ENCOUNTER — Ambulatory Visit: Payer: BC Managed Care – PPO

## 2012-11-28 VITALS — BP 107/77 | HR 91 | Temp 98.7°F | Resp 18

## 2012-11-28 DIAGNOSIS — C8190 Hodgkin lymphoma, unspecified, unspecified site: Secondary | ICD-10-CM

## 2012-11-28 DIAGNOSIS — R059 Cough, unspecified: Secondary | ICD-10-CM

## 2012-11-28 DIAGNOSIS — R0789 Other chest pain: Secondary | ICD-10-CM

## 2012-11-28 DIAGNOSIS — J029 Acute pharyngitis, unspecified: Secondary | ICD-10-CM

## 2012-11-28 DIAGNOSIS — R05 Cough: Secondary | ICD-10-CM

## 2012-11-28 LAB — CBC WITH DIFFERENTIAL/PLATELET
Basophils Absolute: 0 10*3/uL (ref 0.0–0.1)
Basophils Relative: 0 % (ref 0–1)
Eosinophils Absolute: 0.1 10*3/uL (ref 0.0–0.7)
Eosinophils Relative: 2 % (ref 0–5)
HCT: 35.2 % — ABNORMAL LOW (ref 36.0–46.0)
Hemoglobin: 12.4 g/dL (ref 12.0–15.0)
Lymphocytes Relative: 8 % — ABNORMAL LOW (ref 12–46)
Lymphs Abs: 0.3 10*3/uL — ABNORMAL LOW (ref 0.7–4.0)
MCH: 34.4 pg — ABNORMAL HIGH (ref 26.0–34.0)
MCHC: 35.2 g/dL (ref 30.0–36.0)
MCV: 97.8 fL (ref 78.0–100.0)
Monocytes Absolute: 0.4 10*3/uL (ref 0.1–1.0)
Monocytes Relative: 10 % (ref 3–12)
Neutro Abs: 3.1 10*3/uL (ref 1.7–7.7)
Neutrophils Relative %: 81 % — ABNORMAL HIGH (ref 43–77)
Platelets: 216 10*3/uL (ref 150–400)
RBC: 3.6 MIL/uL — ABNORMAL LOW (ref 3.87–5.11)
RDW: 14.3 % (ref 11.5–15.5)
WBC: 3.8 10*3/uL — ABNORMAL LOW (ref 4.0–10.5)

## 2012-11-28 LAB — COMPREHENSIVE METABOLIC PANEL
ALT: 24 U/L (ref 0–35)
AST: 17 U/L (ref 0–37)
Albumin: 3.5 g/dL (ref 3.5–5.2)
Alkaline Phosphatase: 50 U/L (ref 39–117)
BUN: 14 mg/dL (ref 6–23)
CO2: 30 mEq/L (ref 19–32)
Calcium: 9.6 mg/dL (ref 8.4–10.5)
Chloride: 96 mEq/L (ref 96–112)
Creatinine, Ser: 0.82 mg/dL (ref 0.50–1.10)
GFR calc Af Amer: >90 mL/min (ref >90)
GFR calc non Af Amer: 86 mL/min — ABNORMAL LOW (ref >90)
Glucose, Bld: 97 mg/dL (ref 70–99)
Potassium: 3.6 mEq/L (ref 3.5–5.1)
Sodium: 135 mEq/L (ref 135–145)
Total Bilirubin: 0.3 mg/dL (ref 0.3–1.2)
Total Protein: 6.7 g/dL (ref 6.0–8.3)

## 2012-11-28 LAB — LACTATE DEHYDROGENASE: LDH: 175 U/L (ref 94–250)

## 2012-11-28 LAB — SEDIMENTATION RATE: Sed Rate: 5 mm/hr (ref 0–22)

## 2012-11-28 MED ORDER — HYDROCOD POLST-CHLORPHEN POLST 10-8 MG/5ML PO LQCR: 5.0000 mL | Freq: Two times a day (BID) | ORAL | Status: DC | PRN

## 2012-11-28 NOTE — Telephone Encounter (Signed)
I paged caller. After speaking with PA at [facility] is due for PFT at [facility] tomorrow Friday 11-29-12 at 2:30pm. PA would like to speak with RB prior to PFT's to enlighten him on the patient and her health concerns. Jenita Seashore , PA is aware that I will send this message to RB and Lauren to have RB speak with Elijah Birk today. Thanks.

## 2012-11-28 NOTE — Telephone Encounter (Signed)
Thank you :)

## 2012-11-28 NOTE — Patient Instructions (Addendum)
[facility] Discharge Instructions  RECOMMENDATIONS MADE BY THE CONSULTANT AND ANY TEST RESULTS WILL BE SENT TO YOUR REFERRING PHYSICIAN.  EXAM FINDINGS BY THE PHYSICIAN TODAY AND SIGNS OR SYMPTOMS TO REPORT TO CLINIC OR PRIMARY PHYSICIAN: Exam findings as discussed by T. Kefalas, PA-C.  SPECIAL INSTRUCTIONS/FOLLOW-UP: 1.  You are scheduled for a pulmonary function test tomorrow at 2:30pm.  Please arrive at the Main Floor Admitting (Entrance A off of [address]) by 2:15 to be registered.  No inhalers/nebulizers after 9pm tonight. 2.  Return in 2 weeks to see Dr. Mariel Sleet.  We will contact you sooner if needed pending your labs that were done today or from the results of your PFT study.  Please call us before your next appt. if needed. 3.  You were given a prescription for tussionex for cough.  Thank you for choosing [name] to provide your oncology and hematology care.  To afford each patient quality time with our providers, please arrive at least 15 minutes before your scheduled appointment time.  With your help, our goal is to use those 15 minutes to complete the necessary work-up to ensure our physicians have the information they need to help with your evaluation and healthcare recommendations.    Effective January 1st, 2014, we ask that you re-schedule your appointment with our physicians should you arrive 10 or more minutes late for your appointment.  We strive to give you quality time with our providers, and arriving late affects you and other patients whose appointments are after yours.    Again, thank you for choosing [facility].  Our hope is that these requests will decrease the amount of time that you wait before being seen by our physicians.       _____________________________________________________________  Should you have questions after your visit to [facility], please contact our office at [phone]  between the hours of 8:30 a.m. and 5:00 p.m.  Voicemails left after 4:30 p.m. will not be returned until the following business day.  For prescription refill requests, have your pharmacy contact our office with your prescription refill request.

## 2012-11-29 ENCOUNTER — Ambulatory Visit (HOSPITAL_COMMUNITY)
Admission: RE | Admit: 2012-11-29 | Discharge: 2012-11-29 | Disposition: A | Discharge status: Home / Self Care | Payer: BC Managed Care – PPO | Source: Ambulatory Visit | Attending: Oncology | Admitting: Oncology

## 2012-11-29 ENCOUNTER — Inpatient Hospital Stay (HOSPITAL_COMMUNITY)
Admission: RE | Admit: 2012-11-29 | Discharge: 2012-11-29 | Disposition: A | Discharge status: Home / Self Care | Payer: BC Managed Care – PPO | Source: Ambulatory Visit

## 2012-11-29 ENCOUNTER — Ambulatory Visit: Payer: BC Managed Care – PPO

## 2012-11-29 ENCOUNTER — Other Ambulatory Visit (HOSPITAL_COMMUNITY): Payer: Self-pay | Admitting: Radiology

## 2012-11-29 DIAGNOSIS — R0600 Dyspnea, unspecified: Secondary | ICD-10-CM

## 2012-11-29 DIAGNOSIS — R0989 Other specified symptoms and signs involving the circulatory and respiratory systems: Secondary | ICD-10-CM | POA: Insufficient documentation

## 2012-11-29 DIAGNOSIS — R0609 Other forms of dyspnea: Secondary | ICD-10-CM | POA: Insufficient documentation

## 2012-11-29 LAB — PULMONARY FUNCTION TEST

## 2012-11-29 LAB — BLOOD GAS, ARTERIAL
Acid-Base Excess: 0.6 mmol/L (ref 0.0–2.0)
Bicarbonate: 24.1 mEq/L — ABNORMAL HIGH (ref 20.0–24.0)
Drawn by: 242311
FIO2: 0.21 %
O2 Saturation: 99.9 %
Patient temperature: 98.6
TCO2: 25.2 mmol/L (ref 0–100)
pCO2 arterial: 35 mmHg (ref 35.0–45.0)
pH, Arterial: 7.452 — ABNORMAL HIGH (ref 7.350–7.450)
pO2, Arterial: 101 mmHg — ABNORMAL HIGH (ref 80.0–100.0)

## 2012-11-29 MED ORDER — ALBUTEROL SULFATE (5 MG/ML) 0.5% IN NEBU
2.5000 mg | INHALATION_SOLUTION | Freq: Once | RESPIRATORY_TRACT | Status: AC
  Administered 2012-11-29: 2.5 mg via RESPIRATORY_TRACT

## 2012-12-02 ENCOUNTER — Ambulatory Visit: Payer: BC Managed Care – PPO

## 2012-12-02 ENCOUNTER — Ambulatory Visit (HOSPITAL_COMMUNITY)
Admission: RE | Admit: 2012-12-02 | Discharge: 2012-12-02 | Disposition: A | Discharge status: Home / Self Care | Payer: BC Managed Care – PPO | Source: Ambulatory Visit | Attending: Oncology | Admitting: Oncology

## 2012-12-02 ENCOUNTER — Other Ambulatory Visit (HOSPITAL_COMMUNITY): Payer: Self-pay | Admitting: Oncology

## 2012-12-02 ENCOUNTER — Encounter (HOSPITAL_BASED_OUTPATIENT_CLINIC_OR_DEPARTMENT_OTHER): Payer: BC Managed Care – PPO | Admitting: Oncology

## 2012-12-02 VITALS — BP 108/53 | HR 69 | Wt 201.3 lb

## 2012-12-02 DIAGNOSIS — C8190 Hodgkin lymphoma, unspecified, unspecified site: Secondary | ICD-10-CM

## 2012-12-02 DIAGNOSIS — R5381 Other malaise: Secondary | ICD-10-CM

## 2012-12-02 DIAGNOSIS — R0989 Other specified symptoms and signs involving the circulatory and respiratory systems: Secondary | ICD-10-CM

## 2012-12-02 DIAGNOSIS — R0609 Other forms of dyspnea: Secondary | ICD-10-CM

## 2012-12-02 DIAGNOSIS — R0600 Dyspnea, unspecified: Secondary | ICD-10-CM

## 2012-12-02 DIAGNOSIS — R42 Dizziness and giddiness: Secondary | ICD-10-CM | POA: Insufficient documentation

## 2012-12-02 DIAGNOSIS — R627 Adult failure to thrive: Secondary | ICD-10-CM | POA: Insufficient documentation

## 2012-12-02 LAB — D-DIMER, QUANTITATIVE: D-Dimer, Quant: 0.27 ug/mL-FEU (ref 0.00–0.48)

## 2012-12-02 MED ORDER — IOHEXOL 350 MG/ML SOLN
100.0000 mL | Freq: Once | INTRAVENOUS | Status: AC | PRN
  Administered 2012-12-02: 100 mL via INTRAVENOUS

## 2012-12-02 NOTE — Progress Notes (Signed)
This lady has a history of Hodgkin's disease with bulky mediastinal disease. She was placed in remission after secondary chemotherapy with dose adjusted BEACOPP. She then went on to radiation therapy. After 3 doses of radiation she became very weak very short winded. She's developed a barking cough, difficulty with speaking from laryngitis and no energy.  He was tried on high-dose prednisone 50 mg a for 7-10 days without any great improvement but with mild improvement.  I talked her today about her results of her tests which were unremarkable recently by she said it so weak on the phone and hoarse that I asked her to come see me.  She looks very chronically ill at the time I saw her. Her nail color however was normal. She had no stridor. She did have a coarse voice. Lungs though are clear to auscultation and percussion. She had no lymphadenopathy in the cervical, supraclavicular, infraclavicular or axillary areas. I do not feel her liver or spleen. No inguinal adenopathy. Her heart is not reveal a murmur rub or gallop but her heart rate was 100 2230 sitting and standing. Mild drop in her blood pressure as well. She has no leg edema no arm edema.  I did a CT scan looking for pulmonary embolism which was negative. She also had dizziness and wasn't thinking clearly she states with the CT of the head at the same time which was negative.  When she returns with CAT scans I thought she looks slightly better but still very very weak and tired in appearance.  I am going to try her on 10 mg of prednisone as mentioned in my nurses discharge summary and this will be for a 3 times a day schedule for 10 days and with the taper over the next 3-4 weeks.  I suspect she may have tracheal and laryngeal edema possibly from recall from the drugs that she has been given in the past potentially even though we did not see radiation therapy changes or significant swelling on her CT of the chest.  There may be an element of  steroid withdrawal in this lady's had extensive chemotherapy as well. She will let he know how she's doing and 7 days. If she is not better then I will also consider adding an antidepressant since I think there is an element of depression going on with his much therapy she had be treated with. She agrees with this plan does agree that she may have some depression as well but I do not want to add to drugs that same time to this lady's regimen. I will see her one where the other in approximately 3 weeks.

## 2012-12-02 NOTE — Patient Instructions (Addendum)
[facility] Discharge Instructions  RECOMMENDATIONS MADE BY THE CONSULTANT AND ANY TEST RESULTS WILL BE SENT TO YOUR REFERRING PHYSICIAN.  EXAM FINDINGS BY THE PHYSICIAN TODAY AND SIGNS OR SYMPTOMS TO REPORT TO CLINIC OR PRIMARY PHYSICIAN:   You may have radiation/chemo (adriamycin) interaction and have gotten the irritation to the lungs/trachea.   We do not see edema/adenopathy on the CT Scan.   Your mediastinal mass has shrunk down tremendously.  Your CT of the head was normal.   You may be steroid dependent due to the treatment you were on.   We are going to put you on a Prednisone taper. We are going to try and wake up your adrenal glands.  Prednisone 10mg  three times a day with food x 3 days. Then 10mg  twice a day with food x 3 days. Then 10mg  daily with food x 3 days. Then 10mg  every other day with food x 10 days. Then 1/2 tablet (5mg ) every other day with food for 10 days. Then Stop!   Dr. Mariel Sleet to see you at the end of the taper.   We want to know how you are doing in 1 week. We want to know how you are with your voice/breathing/cough.   If you are still depressed at the end of all this then we can give you an antidepressant.   This tumor may NEVER go away. It can shrink but may NEVER go away.   Keep drinking FLUIDS (water is the best)!!!  If you can walk 1/4 of a mile a day or so that would be great. We don't want you sitting still.      Thank you for choosing [name] to provide your oncology and hematology care.  To afford each patient quality time with our providers, please arrive at least 15 minutes before your scheduled appointment time.  With your help, our goal is to use those 15 minutes to complete the necessary work-up to ensure our physicians have the information they need to help with your evaluation and healthcare recommendations.    Effective January 1st, 2014, we ask that you re-schedule your appointment with our  physicians should you arrive 10 or more minutes late for your appointment.  We strive to give you quality time with our providers, and arriving late affects you and other patients whose appointments are after yours.    Again, thank you for choosing [facility].  Our hope is that these requests will decrease the amount of time that you wait before being seen by our physicians.       _____________________________________________________________  Should you have questions after your visit to [facility] please contact our office at [phone] between the hours of 8:30 a.m. and 5:00 p.m.  Voicemails left after 4:30 p.m. will not be returned until the following business day.  For prescription refill requests, have your pharmacy contact our office with your prescription refill request.

## 2012-12-03 ENCOUNTER — Ambulatory Visit: Payer: BC Managed Care – PPO

## 2012-12-04 ENCOUNTER — Ambulatory Visit: Payer: BC Managed Care – PPO

## 2012-12-05 ENCOUNTER — Ambulatory Visit: Payer: BC Managed Care – PPO

## 2012-12-06 ENCOUNTER — Telehealth (HOSPITAL_COMMUNITY): Payer: Self-pay | Admitting: *Deleted

## 2012-12-06 ENCOUNTER — Ambulatory Visit: Payer: BC Managed Care – PPO

## 2012-12-06 NOTE — Telephone Encounter (Signed)
I called patient to see how she was doing. She is longer whispering and can actually talk using her normal voice. She has been taking Prednisone for approx 5 days. She states that she feels a lot better and that she is not 100% but a lot better. She states that her lungs don't feel heavy anymore, that her air passage is open, and less coughing.

## 2012-12-09 ENCOUNTER — Ambulatory Visit: Payer: BC Managed Care – PPO

## 2012-12-10 ENCOUNTER — Ambulatory Visit: Payer: BC Managed Care – PPO

## 2012-12-11 ENCOUNTER — Ambulatory Visit: Payer: BC Managed Care – PPO

## 2012-12-12 ENCOUNTER — Ambulatory Visit: Payer: BC Managed Care – PPO

## 2012-12-12 ENCOUNTER — Encounter: Payer: Self-pay | Admitting: Radiation Oncology

## 2012-12-15 NOTE — Progress Notes (Signed)
[name], MD  [address]  [place] [address]  Hodgkin's lymphoma  Restless leg - Plan: gabapentin (NEURONTIN) 600 MG tablet  Muscle tightness - Plan: methocarbamol (ROBAXIN) 500 MG tablet  CURRENT THERAPY: Surveillance  INTERVAL HISTORY: [name] [name] 42 y.o. 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 She was then switched to dose adjusted BEACOPP x2 cycles. Her PET scan after the second cycle showed complete remission. Because of probable inflammatory changes in the lungs and a prior Citrobacter blood stream/pulmonary infection felt to be the cause of the PET scan findings in the lungs she has had Levaquin which she is now finished. Now S/P involved field radiation by Dr. Lurline Hare (Rad Onc) finishing on 11/20/2012.  Unfortunately, [name] experienced some peculiar symptoms unrelated to radiation therapy and typical side effects of cytotoxic chemotherapy.  Multiple diagnoses were deleted from the differential diagnosis including cardiac issues via EKG and Bleomycin toxicity via PFT with ABGs.  Radiation pneumonitis was not clearly identified on CT of chest and the CT of chest was not indicative of a cause of her symptomatology.  It was recently thought that this could be secondary to radiation recall and she was started on Prednisone 10 mg TID x 10 days followed by long taper over 3-4 weeks.  I spoke with the patient via telephone last week and she reported that she was significantly improved.  Her voice returned back to normal.   Today, she looks like a new person.  She is smiling, her voice is about 90% back to normal with only a minimal hint of hoarseness. She feels great.  She went Kayaking  over the weekend, she has been walking more than 1 mile per day.  She feels great and she reports, "I feel like I am getting back to my normal self."    She has about 10-14 days left on her Prednisone taper which she is tolerating well.   She reports that she has an upcoming appointment with Dr. [name] who is her primary care physician.  She wishes to cancel that and reschedule it for the future.  She reports, "since you are doing all of the lab work, I do not see what Dr. [name] may do differently."  I will add a lipid panel to her next lab appointment in October.   She also asks about a shingles vaccine.  I will defer this until October when she is out 6 months from therapy at this time.   She is having some right sided cervical neck tightness and tenderness of the trapezius muscle. She is taking Zanaflex and she doesn't think that this is helping too much for this discomfort. The discomfort is nearly giving her a migraine headache. I've given her prescription for Robaxin to see if this helps I've also recommended that she followup with her massage therapist. I really think massage therapist it help her in this arena with her muscle tightness.  Hematologically, the patient denies any complaints ROS questioning is negative. She is negative B. symptomatology.    Past Medical History  Diagnosis Date  . Fibromyalgia   . Eczema   . Depression     parents died close together "was  hard"  . Restless leg   . Heart murmur     Told 'nothing to worry about"  No 2D Echo  . Sleep apnea     Sleep Study- 1996- "little apnea- no tx"  . Headache(784.0)     Birth control pills help  . Hemorrhoid   . Anemia     As a child and while pregnant  . 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 Hypertension     recently dx with hodgkins  . Dyspnea     all the time .".due to the mass"  . Pneumonia April 2013  . CHF (congestive heart failure)     pt stated it was due to the CA but  after chemo began it went away  . Dry skin   . History of radiation therapy 10/28/12-11/20/12    chest & neck,34Gy/37fx    has Fibromyalgia; Eczema; Hypertension; Dyspnea; Depression; Restless leg; Mediastinal mass; Hodgkin's lymphoma; Neutropenia with fever; Citrobacter infection; Bacteremia; Antineoplastic chemotherapy induced pancytopenia; and Hypokalemia on her problem list.     is allergic to other and sulfur.  [name] had no medications administered during this visit.  Past Surgical History  Procedure Laterality Date  . Wisdom tooth extraction    . Dilation and curettage of uterus  1991  . Mediastinal mass biopsy  03/05/12  . Bone marrow biopsy  03/20/2012  . Portacath placement  03/26/2012    Procedure: INSERTION PORT-A-CATH;  Surgeon: Loreli Slot, MD;  Location: [facility] OR;  Service: Thoracic;  Laterality: N/A;  . Port-a-cath removal Left 10/16/2012    Procedure: REMOVAL PORT-A-CATH;  Surgeon: Loreli Slot, MD;  Location: [facility] OR;  Service: Thoracic;  Laterality: Left;    Denies any headaches, dizziness, double vision, fevers, chills, night sweats, nausea, vomiting, diarrhea, constipation, chest pain, heart palpitations, shortness of breath, blood in stool, black tarry stool, urinary pain, urinary burning, urinary frequency, hematuria.   PHYSICAL EXAMINATION  ECOG PERFORMANCE STATUS: 1 - Symptomatic but completely ambulatory  Filed Vitals:   12/16/12 1000  BP: 121/78  Pulse: 90  Temp: 97.9 F (36.6 C)  Resp: 18    GENERAL:alert, no distress, well nourished, well developed, comfortable, cooperative, obese and smiling, voice is returning back to normal SKIN: skin color, texture, turgor are normal, no rashes or significant lesions HEAD: Normocephalic, No masses, lesions, tenderness or abnormalities EYES: normal, PERRLA, EOMI, Conjunctiva are pink and non-injected EARS: External ears normal OROPHARYNX:mucous membranes are moist  NECK: supple, no adenopathy,  thyroid normal size, non-tender, without nodularity, no stridor, non-tender, trachea midline LYMPH:  no palpable lymphadenopathy, no hepatosplenomegaly BREAST:not examined LUNGS: clear to auscultation and percussion HEART: regular rate & rhythm, no murmurs, no gallops, S1 normal and S2 normal ABDOMEN:abdomen soft, non-tender, obese and normal bowel sounds BACK: Back symmetric, no curvature., No CVA tenderness EXTREMITIES:less then 2 second capillary refill, no joint deformities, effusion, or inflammation, no edema, no skin discoloration, no clubbing, no cyanosis  NEURO: alert & oriented x 3 with fluent speech, no focal motor/sensory deficits, gait normal   LABORATORY DATA: CBC    Component Value Date/Time   WBC 3.8* 11/28/2012 1114   WBC 2.5* 11/13/2012 1552   RBC 3.60* 11/28/2012 1114   RBC 3.04* 11/13/2012 1552   HGB 12.4 11/28/2012 1114   HGB 10.5* 11/13/2012 1552   HCT 35.2* 11/28/2012 1114   HCT 29.6* 11/13/2012 1552   PLT 216 11/28/2012 1114   PLT 146 11/13/2012 1552  MCV 97.8 11/28/2012 1114   MCV 97.6 11/13/2012 1552   MCH 34.4* 11/28/2012 1114   MCH 34.7* 11/13/2012 1552   MCHC 35.2 11/28/2012 1114   MCHC 35.6 11/13/2012 1552   RDW 14.3 11/28/2012 1114   RDW 16.3* 11/13/2012 1552   LYMPHSABS 0.3* 11/28/2012 1114   LYMPHSABS 0.1* 11/13/2012 1552   MONOABS 0.4 11/28/2012 1114   MONOABS 0.4 11/13/2012 1552   EOSABS 0.1 11/28/2012 1114   EOSABS 0.1 11/13/2012 1552   BASOSABS 0.0 11/28/2012 1114   BASOSABS 0.0 11/13/2012 1552      Chemistry      Component Value Date/Time   NA 135 11/28/2012 1114   NA 136 11/13/2012 1552   K 3.6 11/28/2012 1114   K 3.6 11/13/2012 1552   CL 96 11/28/2012 1114   CL 101 11/13/2012 1552   CO2 30 11/28/2012 1114   CO2 27 11/13/2012 1552   BUN 14 11/28/2012 1114   BUN 20.4 11/13/2012 1552   CREATININE 0.82 11/28/2012 1114   CREATININE 1.0 11/13/2012 1552      Component Value Date/Time   CALCIUM 9.6 11/28/2012 1114   CALCIUM 9.0 11/13/2012 1552   ALKPHOS 50 11/28/2012 1114   ALKPHOS  62 11/13/2012 1552   AST 17 11/28/2012 1114   AST 27 11/13/2012 1552   ALT 24 11/28/2012 1114   ALT 22 11/13/2012 1552   BILITOT 0.3 11/28/2012 1114   BILITOT 0.88 11/13/2012 1552     Results for PARISHA, BEAULAC (MRN [id number]) as of 12/15/2012 22:24  Ref. Range 11/28/2012 11:14  LDH Latest Range: 94-250 U/L 175   Results for EBONEY, CLAYBROOK (MRN [id number]) as of 12/15/2012 22:24  Ref. Range 11/28/2012 11:14  Sed Rate Latest Range: 0-22 mm/hr 5     RADIOGRAPHIC STUDIES:  12/02/2012  *RADIOLOGY REPORT*  Clinical Data: Failure to thrive. Hodgkin disease with ongoing  radiation treatment and chemotherapy.  CT ANGIOGRAPHY CHEST  Technique: Multidetector CT imaging of the chest using the  standard protocol during bolus administration of intravenous  contrast. Multiplanar reconstructed images including MIPs were  obtained and reviewed to evaluate the vascular anatomy.  Contrast: OMNIPAQUE IOHEXOL 350 MG/ML SOLN  Comparison: 11/19/2012.  Findings: No pulmonary embolus. Prevascular nodal tissue measures  1.9 x 6.0 cm, unchanged. No hilar or axillary adenopathy. Heart  size normal. No pericardial effusion.  The lungs are clear. No pleural fluid. Airway is unremarkable.  Incidental imaging of the upper abdomen shows no acute findings.  No worrisome lytic or sclerotic lesions.  IMPRESSION:  1. Negative for pulmonary embolus.  2. Stable prevascular adenopathy.  Original Report Authenticated By: Leanna Battles, M.D.    12/02/2012   *RADIOLOGY REPORT*  Clinical Data: Hodgkin's lymphoma. Dizziness. Failure to thrive.  CT HEAD WITHOUT AND WITH CONTRAST  Technique: Contiguous axial images were obtained from the base of  the skull through the vertex without and with intravenous contrast.  Contrast: OMNIPAQUE IOHEXOL 350 MG/ML SOLN  Comparison: None.  Findings: No acute intracranial abnormality is present.  Specifically, there is no evidence for acute infarct, hemorrhage,  mass,  hydrocephalus, or extra-axial fluid collection. The  paranasal sinuses and mastoid air cells are clear. The globes and  orbits are intact. The osseous skull is intact.  The postcontrast images demonstrate no pathologic enhancement.  IMPRESSION:  Negative CT of the head without and with contrast.  Original Report Authenticated By: Marin Roberts, M.D.    ASSESSMENT:  1. 1. Stage II A. bulky mediastinal  nodular sclerosing Hodgkin's disease presenting with a large anterior mediastinal mass as well as left cervical and right cervical lymph nodes status post 5 cycles of ABVD but with incomplete response evaluated after cycle 3 and after cycle #5. She was then switched to dose adjusted BEACOPP x2 cycles. Her PET scan after the second cycle showed complete remission. Because of probable inflammatory changes in the lungs and a prior Citrobacter blood stream/pulmonary infection felt to be the cause of the PET scan findings in the lungs she has had Levaquin which she is now finished. Now S/P involved field radiation by Dr. Lurline Hare (Rad Onc) finishing on 11/20/2012. 2. Presumed radiation recall symptoms, treated with Prednisone, significantly improved  Patient Active Problem List   Diagnosis Date Noted  . Citrobacter infection 09/30/2012  . Bacteremia 09/30/2012  . Antineoplastic chemotherapy induced pancytopenia 09/30/2012  . Hypokalemia 09/30/2012  . Neutropenia with fever 09/27/2012  . Hodgkin's lymphoma 03/20/2012  . Fibromyalgia 02/29/2012  . Eczema   . Hypertension   . Dyspnea   . Depression   . Restless leg   . Mediastinal mass 02/26/2012    Class: Diagnosis of     PLAN:  1. I personally reviewed and went over laboratory results with the patient. 2. I personally reviewed and went over radiographic studies with the patient. 3. PET scan 05/26/2013 as scheduled 4. Labs in 3 months: CBC diff, CMET, LDH, ESR, Lipid panel (to be faxed to PCP) 5. Discussed patient's case with  Dr. [name]. TSH was performed in June 2014 and was 1.7. 6. Discussed the patient's request for Shingles Vaccine with pharmacist and it is contraindicated for those with Lymphomas and Leukemias.   7. Rx for Robaxin 8. Rx for Gabapentin for RLS 9. Recommended massage therapist for muscular tightness 10. Return in 3 months for follow-up   THERAPY PLAN:  Patient is doing well from a Hodgkin's Lymphoma standpoint and we will pursue restaging scans in December 2014 as scheduled unless she has issues moving forward.  She is recovering well from her past encounters with severe fatigue and shortness of breath. It appears as though the prednisone is significantly helping her. We'll see her back in 3 months time for followup of laboratory work. I discussed the patient's case with her primary care physician.   All questions were answered. The patient knows to call the clinic with any problems, questions or concerns. We can certainly see the patient much sooner if necessary.  Patient and plan discussed with Dr. Janese Banks and he is in agreement with the aforementioned.   Shilynn Hoch

## 2012-12-16 ENCOUNTER — Ambulatory Visit: Payer: BC Managed Care – PPO

## 2012-12-16 ENCOUNTER — Encounter (HOSPITAL_COMMUNITY): Payer: BC Managed Care – PPO | Attending: Oncology | Admitting: Oncology

## 2012-12-16 ENCOUNTER — Encounter (HOSPITAL_COMMUNITY): Payer: Self-pay | Admitting: Oncology

## 2012-12-16 VITALS — BP 121/78 | HR 90 | Temp 97.9°F | Resp 18 | Wt 209.4 lb

## 2012-12-16 DIAGNOSIS — C8190 Hodgkin lymphoma, unspecified, unspecified site: Secondary | ICD-10-CM

## 2012-12-16 DIAGNOSIS — G2581 Restless legs syndrome: Secondary | ICD-10-CM | POA: Insufficient documentation

## 2012-12-16 DIAGNOSIS — M629 Disorder of muscle, unspecified: Secondary | ICD-10-CM

## 2012-12-16 DIAGNOSIS — R42 Dizziness and giddiness: Secondary | ICD-10-CM | POA: Insufficient documentation

## 2012-12-16 DIAGNOSIS — M2420 Disorder of ligament, unspecified site: Secondary | ICD-10-CM | POA: Insufficient documentation

## 2012-12-16 DIAGNOSIS — M6289 Other specified disorders of muscle: Secondary | ICD-10-CM

## 2012-12-16 DIAGNOSIS — M62838 Other muscle spasm: Secondary | ICD-10-CM | POA: Insufficient documentation

## 2012-12-16 MED ORDER — METHOCARBAMOL 500 MG PO TABS: 500.0000 mg | ORAL_TABLET | Freq: Two times a day (BID) | ORAL | Status: DC

## 2012-12-16 MED ORDER — GABAPENTIN 600 MG PO TABS: 600.0000 mg | ORAL_TABLET | Freq: Two times a day (BID) | ORAL | Status: DC

## 2012-12-16 NOTE — Patient Instructions (Addendum)
[facility] Discharge Instructions  RECOMMENDATIONS MADE BY THE CONSULTANT AND ANY TEST RESULTS WILL BE SENT TO YOUR REFERRING PHYSICIAN.  EXAM FINDINGS BY THE PHYSICIAN TODAY AND SIGNS OR SYMPTOMS TO REPORT TO CLINIC OR PRIMARY PHYSICIAN: Exam and discussion by PA.  MEDICATIONS PRESCRIBED:  Refills for gabapentin and robaxin  INSTRUCTIONS GIVEN AND DISCUSSED: Report fevers, night sweats, recurring infections, shortness of breath or other problems.  SPECIAL INSTRUCTIONS/FOLLOW-UP: October for blood work and exam.  Thank you for choosing [name] to provide your oncology and hematology care.  To afford each patient quality time with our providers, please arrive at least 15 minutes before your scheduled appointment time.  With your help, our goal is to use those 15 minutes to complete the necessary work-up to ensure our physicians have the information they need to help with your evaluation and healthcare recommendations.    Effective January 1st, 2014, we ask that you re-schedule your appointment with our physicians should you arrive 10 or more minutes late for your appointment.  We strive to give you quality time with our providers, and arriving late affects you and other patients whose appointments are after yours.    Again, thank you for choosing [facility].  Our hope is that these requests will decrease the amount of time that you wait before being seen by our physicians.       _____________________________________________________________  Should you have questions after your visit to [facility], please contact our office at [phone] between the hours of 8:30 a.m. and 5:00 p.m.  Voicemails left after 4:30 p.m. will not be returned until the following business day.  For prescription refill requests, have your pharmacy contact our office with your prescription refill request.

## 2012-12-17 ENCOUNTER — Ambulatory Visit: Payer: BC Managed Care – PPO

## 2012-12-17 NOTE — Progress Notes (Signed)
Thank you for the note, [name] [name].  I'm so glad she agreed to the steroids and is feeling better.  I think I see her next week.

## 2012-12-19 ENCOUNTER — Ambulatory Visit
Admission: RE | Admit: 2012-12-19 | Discharge: 2012-12-19 | Disposition: A | Discharge status: Home / Self Care | Payer: BC Managed Care – PPO | Source: Ambulatory Visit | Attending: Radiation Oncology | Admitting: Radiation Oncology

## 2012-12-19 VITALS — BP 138/88 | HR 84 | Temp 98.1°F | Ht 66.0 in | Wt 213.7 lb

## 2012-12-19 DIAGNOSIS — C8190 Hodgkin lymphoma, unspecified, unspecified site: Secondary | ICD-10-CM

## 2012-12-19 HISTORY — DX: Personal history of irradiation: Z92.3

## 2012-12-19 NOTE — Progress Notes (Signed)
Larey Seat here for follow up after treatment to her chest and neck. She denies pain.  She does have fatigue.  She states that her swallowing is better and is able to eat anything she wants.  Her appetite is good.  She does have a cough which she states is better.  She is bringing up sputum that is a "vanilla color."  Her skin on her chest is pink and intact.

## 2012-12-19 NOTE — Progress Notes (Signed)
Department of Radiation Oncology  Phone:  [phone] Fax:        [phone]   Name: SERIN THORNELL MRN: [id number]  DOB: Oct 27, 1970  Date: 12/19/2012  Follow Up Visit Note  Diagnosis: Stage IIB Hodgkin's lymphoma  Summary and Interval since last radiation: One month from 34 gray in 17 fractions completed 11/20/2012  Interval History: Jacqueline presents today for routine followup.  She really had a tough time toward the end of radiation. Her cough is finally improving. It she is still on a steroid taper and is on 5 mg every other day. She is a PET scheduled in December. She feels like her fatigue is dependent on whether or not she has taken a steroid pill that day or not. She was able to go kayaking over the weekend and starting to walk about a mile are mild and half every day. She is very upset about Dr. Mariel Sleet leaving [name]. Her skin redness is improving. Her voice is back to normal. She can swallow normally.  Allergies:  Allergies  Allergen Reactions  . Other Other (See Comments)    Patient can not have straight oxygen due to reaction with chemo medications.   . Sulfur Anaphylaxis    Medications:  Current Outpatient Prescriptions  Medication Sig Dispense Refill  . ALPRAZolam (XANAX) 1 MG tablet Take 0.5 mg by mouth 4 (four) times daily as needed for anxiety.      . Biotin 1 MG CAPS Take 1 tablet by mouth daily.      . cholecalciferol (VITAMIN D) 1000 UNITS tablet Take 1,000 Units by mouth daily.      . Cyanocobalamin (VITAMIN B-12 PO) Take 1 capsule by mouth daily.      Marland Kitchen gabapentin (NEURONTIN) 600 MG tablet Take 1 tablet (600 mg total) by mouth 2 (two) times daily.  60 tablet  1  . Ibuprofen-Diphenhydramine Cit (ADVIL PM PO) Take 2 tablets by mouth as needed.      Marland Kitchen KRILL OIL 1000 MG CAPS Take 1 capsule by mouth daily.       Marland Kitchen lisinopril-hydrochlorothiazide (PRINZIDE,ZESTORETIC) 20-12.5 MG per tablet Take 1 tablet by mouth daily.      . methocarbamol (ROBAXIN)  500 MG tablet Take 1 tablet (500 mg total) by mouth 2 (two) times daily. Refill given on 08/30/12 per Dr. Mariel Sleet.  60 tablet  0  . predniSONE (DELTASONE) 10 MG tablet Take 10 mg by mouth 2 (two) times daily. Taking 10 mg every other day then will go to 5 mg every other day. Now taking 5 mg every other day      . tiZANidine (ZANAFLEX) 4 MG tablet Take 4-6 mg by mouth every 6 (six) hours as needed. Muscle spasms.      . traMADol (ULTRAM) 50 MG tablet Take 50 mg by mouth every 6 (six) hours as needed for pain.      . chlorpheniramine-HYDROcodone (TUSSIONEX) 10-8 MG/5ML LQCR Take 5 mLs by mouth every 12 (twelve) hours as needed. cough  200 mL  1  . GILDESS FE 1/20 1-20 MG-MCG tablet       . hydrochlorothiazide (HYDRODIURIL) 25 MG tablet Take 25 mg by mouth daily. To take 1 daily as needed for swelling      . JUNEL 1/20 1-20 MG-MCG tablet Take 1 tablet by mouth daily.      Marland Kitchen LORazepam (ATIVAN) 1 MG tablet Take 1 tablet by mouth at bedtime as needed. For nausea      .  potassium chloride (K-DUR,KLOR-CON) 10 MEQ tablet Take 10 mEq by mouth 2 (two) times daily.       . prednisoLONE acetate (PRED FORTE) 1 % ophthalmic suspension        No current facility-administered medications for this encounter.   Facility-Administered Medications Ordered in Other Encounters  Medication Dose Route Frequency Provider Last Rate Last Dose  . sodium chloride 0.9 % injection 10 mL  10 mL Intravenous PRN Randall An, MD   10 mL at 10/03/12 1643    Physical Exam:  Filed Vitals:   12/19/12 1436  BP: 138/88  Pulse: 84  Temp: 98.1 F (36.7 C)   she is a pleasant female in no distress sitting comfortably examining table. She still has some very pink skin around her neck and upper chest. This is well-healed.  IMPRESSION: Breeana is a 42 y.o. female status post involved field radiation for stage IIB Hodgkin's lymphoma with resolving acute effects of treatment  PLAN:  Its hard to know what caused so many side effects  for Glorianna but I'm glad she is feeling better. It seems as though she is slowly improving and finally headed in the right direction. She has regularly scheduled followup with medical oncology. I have discussed with her increased surveillance with MRI of the bilateral breasts annually along with her annual mammograms which are due in August. I will place orders for her MRI. We also discussed sun protection in the treated areas which he is agreed to do. I will see her back on an as-needed basis.    Lurline Hare, MD

## 2012-12-20 ENCOUNTER — Telehealth (HOSPITAL_COMMUNITY): Payer: Self-pay | Admitting: *Deleted

## 2012-12-20 NOTE — Telephone Encounter (Signed)
[name] [name] called and wanted to know if you could call in her Lisinopril 20-12.5 mg tablets.  She said she had called Dr. Keane Police office 3 times this week to get them to refill and no response and she has now been out for 4 days.Marland KitchenMarland KitchenMarland Kitchen

## 2012-12-20 NOTE — Telephone Encounter (Signed)
Rayah called and wanted to see if

## 2012-12-23 ENCOUNTER — Other Ambulatory Visit (HOSPITAL_COMMUNITY): Payer: BC Managed Care – PPO

## 2012-12-24 ENCOUNTER — Ambulatory Visit (HOSPITAL_COMMUNITY): Payer: BC Managed Care – PPO

## 2012-12-24 ENCOUNTER — Telehealth: Payer: Self-pay | Admitting: *Deleted

## 2012-12-24 NOTE — Telephone Encounter (Signed)
CALLED PATIENT TO INFORM OF BREAST MRI, SPOKE WITH PATIENT AND SHE IS AWARE OF THIS TEST

## 2012-12-25 ENCOUNTER — Encounter (HOSPITAL_BASED_OUTPATIENT_CLINIC_OR_DEPARTMENT_OTHER): Payer: BC Managed Care – PPO | Admitting: Oncology

## 2012-12-25 ENCOUNTER — Other Ambulatory Visit (HOSPITAL_COMMUNITY): Payer: Self-pay | Admitting: Oncology

## 2012-12-25 VITALS — BP 130/83 | HR 83 | Temp 97.7°F | Resp 16

## 2012-12-25 DIAGNOSIS — M62838 Other muscle spasm: Secondary | ICD-10-CM

## 2012-12-25 DIAGNOSIS — R42 Dizziness and giddiness: Secondary | ICD-10-CM

## 2012-12-25 DIAGNOSIS — C8190 Hodgkin lymphoma, unspecified, unspecified site: Secondary | ICD-10-CM

## 2012-12-25 LAB — CBC WITH DIFFERENTIAL/PLATELET
Basophils Absolute: 0 10*3/uL (ref 0.0–0.1)
Basophils Relative: 0 % (ref 0–1)
Eosinophils Absolute: 0.1 10*3/uL (ref 0.0–0.7)
Eosinophils Relative: 3 % (ref 0–5)
HCT: 33.2 % — ABNORMAL LOW (ref 36.0–46.0)
Hemoglobin: 12.2 g/dL (ref 12.0–15.0)
Lymphocytes Relative: 11 % — ABNORMAL LOW (ref 12–46)
Lymphs Abs: 0.4 10*3/uL — ABNORMAL LOW (ref 0.7–4.0)
MCH: 34.9 pg — ABNORMAL HIGH (ref 26.0–34.0)
MCHC: 36.7 g/dL — ABNORMAL HIGH (ref 30.0–36.0)
MCV: 94.9 fL (ref 78.0–100.0)
Monocytes Absolute: 0.1 10*3/uL (ref 0.1–1.0)
Monocytes Relative: 4 % (ref 3–12)
Neutro Abs: 2.8 10*3/uL (ref 1.7–7.7)
Neutrophils Relative %: 83 % — ABNORMAL HIGH (ref 43–77)
Platelets: 165 10*3/uL (ref 150–400)
RBC: 3.5 MIL/uL — ABNORMAL LOW (ref 3.87–5.11)
RDW: 12.2 % (ref 11.5–15.5)
WBC: 3.4 10*3/uL — ABNORMAL LOW (ref 4.0–10.5)

## 2012-12-25 LAB — COMPREHENSIVE METABOLIC PANEL
ALT: 15 U/L (ref 0–35)
AST: 19 U/L (ref 0–37)
Albumin: 3.5 g/dL (ref 3.5–5.2)
Alkaline Phosphatase: 49 U/L (ref 39–117)
BUN: 12 mg/dL (ref 6–23)
CO2: 28 mEq/L (ref 19–32)
Calcium: 9.3 mg/dL (ref 8.4–10.5)
Chloride: 103 mEq/L (ref 96–112)
Creatinine, Ser: 0.7 mg/dL (ref 0.50–1.10)
GFR calc Af Amer: >90 mL/min (ref >90)
GFR calc non Af Amer: >90 mL/min (ref >90)
Glucose, Bld: 103 mg/dL — ABNORMAL HIGH (ref 70–99)
Potassium: 4.1 mEq/L (ref 3.5–5.1)
Sodium: 138 mEq/L (ref 135–145)
Total Bilirubin: 0.5 mg/dL (ref 0.3–1.2)
Total Protein: 6.6 g/dL (ref 6.0–8.3)

## 2012-12-25 MED ORDER — LORAZEPAM 1 MG PO TABS
ORAL_TABLET | ORAL | Status: AC
  Filled 2012-12-25: qty 1

## 2012-12-25 MED ORDER — LORAZEPAM 1 MG PO TABS
0.5000 mg | ORAL_TABLET | Freq: Once | ORAL | Status: AC
  Administered 2012-12-25: 0.5 mg via SUBLINGUAL

## 2012-12-25 MED ORDER — PROCHLORPERAZINE MALEATE 10 MG PO TABS: 10.0000 mg | ORAL_TABLET | Freq: Four times a day (QID) | ORAL | Status: DC | PRN

## 2012-12-25 MED ORDER — PROCHLORPERAZINE MALEATE 10 MG PO TABS
10.0000 mg | ORAL_TABLET | Freq: Once | ORAL | Status: AC
  Administered 2012-12-25: 10 mg via ORAL
  Filled 2012-12-25: qty 1

## 2012-12-25 MED ORDER — TIZANIDINE HCL 4 MG PO TABS: 4.0000 mg | ORAL_TABLET | Freq: Four times a day (QID) | ORAL | Status: DC | PRN

## 2012-12-25 MED ORDER — MECLIZINE HCL 50 MG PO TABS: 50.0000 mg | ORAL_TABLET | Freq: Three times a day (TID) | ORAL | Status: DC | PRN

## 2012-12-25 NOTE — Patient Instructions (Addendum)
[facility] Discharge Instructions  RECOMMENDATIONS MADE BY THE CONSULTANT AND ANY TEST RESULTS WILL BE SENT TO YOUR REFERRING PHYSICIAN.  A prescription was given to you for Antivert. This should help with the dizziness. A prescription was given to you for Compazine. This will help with the nausea, but it is also helpful in treatment of Vertigo. Please follow-up with your primary care physician. Let us know by Friday morning if your symptoms have not improved. Keep all other appointments as scheduled.  Thank you for choosing [name] to provide your oncology and hematology care.  To afford each patient quality time with our providers, please arrive at least 15 minutes before your scheduled appointment time.  With your help, our goal is to use those 15 minutes to complete the necessary work-up to ensure our physicians have the information they need to help with your evaluation and healthcare recommendations.    Effective January 1st, 2014, we ask that you re-schedule your appointment with our physicians should you arrive 10 or more minutes late for your appointment.  We strive to give you quality time with our providers, and arriving late affects you and other patients whose appointments are after yours.    Again, thank you for choosing [facility].  Our hope is that these requests will decrease the amount of time that you wait before being seen by our physicians.       _____________________________________________________________  Should you have questions after your visit to [facility], please contact our office at [phone] between the hours of 8:30 a.m. and 5:00 p.m.  Voicemails left after 4:30 p.m. will not be returned until the following business day.  For prescription refill requests, have your pharmacy contact our office with your prescription refill request.

## 2012-12-25 NOTE — Progress Notes (Signed)
Patient is seen as a walk-in.  She originally went to her PCP, but his office's power is out and therefore the office is closed.  She reported to the clinic without contacting the clinic first.   [name] [name] was noted to walk into the clinic, unstable on her feet with her arms extended to help with balance.  A wheelchair was quickly ascertained and she was placed in the wheelchair with a basin due to severe nausea.  She reports to me that the room is spinning.  She reports that she thinks she has vertigo. This began last night and has progressively worsened over the past 18 hours.  As a result of her dizziness and nausea, she has not been eating or drinking fluids.   Labs were performed along with vitals including orthostatic BPs.   Orthostatics were unremarkable with a sitting BP of 118/83 HR 83 and standing 130/83 HR 83.  BP 130/83  Pulse 83  Temp(Src) 97.7 F (36.5 C) (Oral)  Resp 16  SpO2 98% Gen: Patient seen in wheelchair.  Eyes closed or squinting. Basin in her hands.  She is pleasant considering her condition. HEENT: Atraumatic, normocephalic Skin: Warm and dry Neuro: A+O x 3.  CBC    Component Value Date/Time   WBC 3.4* 12/25/2012 0951   WBC 2.5* 11/13/2012 1552   RBC 3.50* 12/25/2012 0951   RBC 3.04* 11/13/2012 1552   HGB 12.2 12/25/2012 0951   HGB 10.5* 11/13/2012 1552   HCT 33.2* 12/25/2012 0951   HCT 29.6* 11/13/2012 1552   PLT 165 12/25/2012 0951   PLT 146 11/13/2012 1552   MCV 94.9 12/25/2012 0951   MCV 97.6 11/13/2012 1552   MCH 34.9* 12/25/2012 0951   MCH 34.7* 11/13/2012 1552   MCHC 36.7* 12/25/2012 0951   MCHC 35.6 11/13/2012 1552   RDW 12.2 12/25/2012 0951   RDW 16.3* 11/13/2012 1552   LYMPHSABS 0.4* 12/25/2012 0951   LYMPHSABS 0.1* 11/13/2012 1552   MONOABS 0.1 12/25/2012 0951   MONOABS 0.4 11/13/2012 1552   EOSABS 0.1 12/25/2012 0951   EOSABS 0.1 11/13/2012 1552   BASOSABS 0.0 12/25/2012 0951   BASOSABS 0.0 11/13/2012 1552      Chemistry      Component Value Date/Time   NA 138 12/25/2012  0951   NA 136 11/13/2012 1552   K 4.1 12/25/2012 0951   K 3.6 11/13/2012 1552   CL 103 12/25/2012 0951   CL 101 11/13/2012 1552   CO2 28 12/25/2012 0951   CO2 27 11/13/2012 1552   BUN 12 12/25/2012 0951   BUN 20.4 11/13/2012 1552   CREATININE 0.70 12/25/2012 0951   CREATININE 1.0 11/13/2012 1552      Component Value Date/Time   CALCIUM 9.3 12/25/2012 0951   CALCIUM 9.0 11/13/2012 1552   ALKPHOS 49 12/25/2012 0951   ALKPHOS 62 11/13/2012 1552   AST 19 12/25/2012 0951   AST 27 11/13/2012 1552   ALT 15 12/25/2012 0951   ALT 22 11/13/2012 1552   BILITOT 0.5 12/25/2012 0951   BILITOT 0.88 11/13/2012 1552      12/02/2012 *RADIOLOGY REPORT*  Clinical Data: Hodgkin's lymphoma. Dizziness. Failure to thrive.  CT HEAD WITHOUT AND WITH CONTRAST  Technique: Contiguous axial images were obtained from the base of  the skull through the vertex without and with intravenous contrast.  Contrast: OMNIPAQUE IOHEXOL 350 MG/ML SOLN  Comparison: None.  Findings: No acute intracranial abnormality is present.  Specifically, there is no evidence for acute infarct,  hemorrhage,  mass, hydrocephalus, or extra-axial fluid collection. The  paranasal sinuses and mastoid air cells are clear. The globes and  orbits are intact. The osseous skull is intact.  The postcontrast images demonstrate no pathologic enhancement.  IMPRESSION:  Negative CT of the head without and with contrast.  Original Report Authenticated By: Marin Roberts, M.D.   Assessment: 1. Vertigo 2. Dizziness 3. Nausea 4. 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 She was then switched to dose adjusted BEACOPP x2 cycles. Her PET scan after the second cycle showed complete remission. S/P involved field radiation by Dr.  Michell Heinrich.  Plan: 1. CBC diff, CMET 2. Chart reviewed 3. I personally reviewed and went over radiographic studies with the patient. 4. Rx for Meclizine 50 mg TID PRN dizziness #30 with 0 refills 5. Compazine 10 mg #30 with 0 refills for nausea/vomiting 6. Compazine 10 mg PO now 7. Ativan 0.5 mg SL now 8. Refill on Tizanidine as her next appointment with prescribing physician is in 2 weeks 9. Contact PCP tomorrow for follow-up 10. Contact clinic on Friday with any worsening signs or symptoms.  11. Return as scheduled.   Patient and plan discussed with Dr. Sharia Reeve and he is in agreement with the aforementioned.   [name],[name]

## 2012-12-30 ENCOUNTER — Ambulatory Visit (HOSPITAL_COMMUNITY): Payer: BC Managed Care – PPO | Admitting: Oncology

## 2013-01-31 ENCOUNTER — Telehealth: Payer: Self-pay | Admitting: Radiation Oncology

## 2013-01-31 NOTE — Telephone Encounter (Signed)
Per SW, faxed all nursing notes and telephone notes, EOT, FUP, labs from 6/3-4, consent to [employer].  Received confirmation.

## 2013-02-03 ENCOUNTER — Other Ambulatory Visit (HOSPITAL_COMMUNITY): Payer: Self-pay | Admitting: *Deleted

## 2013-02-03 ENCOUNTER — Encounter (HOSPITAL_COMMUNITY): Payer: BC Managed Care – PPO | Attending: Oncology

## 2013-02-03 DIAGNOSIS — C8190 Hodgkin lymphoma, unspecified, unspecified site: Secondary | ICD-10-CM

## 2013-02-03 LAB — CBC WITH DIFFERENTIAL/PLATELET
Basophils Absolute: 0 10*3/uL (ref 0.0–0.1)
Basophils Relative: 0 % (ref 0–1)
Eosinophils Absolute: 0.2 10*3/uL (ref 0.0–0.7)
Eosinophils Relative: 4 % (ref 0–5)
HCT: 35.8 % — ABNORMAL LOW (ref 36.0–46.0)
Hemoglobin: 12.5 g/dL (ref 12.0–15.0)
Lymphocytes Relative: 8 % — ABNORMAL LOW (ref 12–46)
Lymphs Abs: 0.3 10*3/uL — ABNORMAL LOW (ref 0.7–4.0)
MCH: 33.6 pg (ref 26.0–34.0)
MCHC: 34.9 g/dL (ref 30.0–36.0)
MCV: 96.2 fL (ref 78.0–100.0)
Monocytes Absolute: 0.4 10*3/uL (ref 0.1–1.0)
Monocytes Relative: 11 % (ref 3–12)
Neutro Abs: 2.9 10*3/uL (ref 1.7–7.7)
Neutrophils Relative %: 77 % (ref 43–77)
Platelets: 217 10*3/uL (ref 150–400)
RBC: 3.72 MIL/uL — ABNORMAL LOW (ref 3.87–5.11)
RDW: 12.6 % (ref 11.5–15.5)
WBC: 3.8 10*3/uL — ABNORMAL LOW (ref 4.0–10.5)

## 2013-02-03 LAB — COMPREHENSIVE METABOLIC PANEL
ALT: 17 U/L (ref 0–35)
AST: 20 U/L (ref 0–37)
Albumin: 3.6 g/dL (ref 3.5–5.2)
Alkaline Phosphatase: 51 U/L (ref 39–117)
BUN: 18 mg/dL (ref 6–23)
CO2: 27 mEq/L (ref 19–32)
Calcium: 9.7 mg/dL (ref 8.4–10.5)
Chloride: 102 mEq/L (ref 96–112)
Creatinine, Ser: 0.8 mg/dL (ref 0.50–1.10)
GFR calc Af Amer: >90 mL/min (ref >90)
GFR calc non Af Amer: 89 mL/min — ABNORMAL LOW (ref >90)
Glucose, Bld: 96 mg/dL (ref 70–99)
Potassium: 3.8 mEq/L (ref 3.5–5.1)
Sodium: 139 mEq/L (ref 135–145)
Total Bilirubin: 0.4 mg/dL (ref 0.3–1.2)
Total Protein: 7 g/dL (ref 6.0–8.3)

## 2013-02-03 LAB — LACTATE DEHYDROGENASE: LDH: 195 U/L (ref 94–250)

## 2013-02-03 NOTE — Progress Notes (Signed)
Holland Falling presented for labwork. Labs per MD order drawn via Peripheral Line 25 gauge needle inserted in rt ac.  Good blood return present. Procedure without incident.  Needle removed intact. Patient tolerated procedure well.

## 2013-03-25 ENCOUNTER — Encounter (HOSPITAL_COMMUNITY): Payer: BC Managed Care – PPO | Attending: Oncology

## 2013-03-25 DIAGNOSIS — Z0000 Encounter for general adult medical examination without abnormal findings: Secondary | ICD-10-CM | POA: Insufficient documentation

## 2013-03-25 DIAGNOSIS — C8190 Hodgkin lymphoma, unspecified, unspecified site: Secondary | ICD-10-CM

## 2013-03-25 LAB — CBC WITH DIFFERENTIAL/PLATELET
Basophils Absolute: 0 10*3/uL (ref 0.0–0.1)
Basophils Relative: 0 % (ref 0–1)
Eosinophils Absolute: 0.1 10*3/uL (ref 0.0–0.7)
Eosinophils Relative: 2 % (ref 0–5)
HCT: 38.5 % (ref 36.0–46.0)
Hemoglobin: 13.7 g/dL (ref 12.0–15.0)
Lymphocytes Relative: 10 % — ABNORMAL LOW (ref 12–46)
Lymphs Abs: 0.4 10*3/uL — ABNORMAL LOW (ref 0.7–4.0)
MCH: 32.9 pg (ref 26.0–34.0)
MCHC: 35.6 g/dL (ref 30.0–36.0)
MCV: 92.5 fL (ref 78.0–100.0)
Monocytes Absolute: 0.3 10*3/uL (ref 0.1–1.0)
Monocytes Relative: 7 % (ref 3–12)
Neutro Abs: 3.4 10*3/uL (ref 1.7–7.7)
Neutrophils Relative %: 82 % — ABNORMAL HIGH (ref 43–77)
Platelets: 218 10*3/uL (ref 150–400)
RBC: 4.16 MIL/uL (ref 3.87–5.11)
RDW: 11.9 % (ref 11.5–15.5)
WBC: 4.1 10*3/uL (ref 4.0–10.5)

## 2013-03-25 LAB — SEDIMENTATION RATE: Sed Rate: 4 mm/hr (ref 0–22)

## 2013-03-25 LAB — COMPREHENSIVE METABOLIC PANEL
ALT: 15 U/L (ref 0–35)
AST: 18 U/L (ref 0–37)
Albumin: 3.5 g/dL (ref 3.5–5.2)
Alkaline Phosphatase: 50 U/L (ref 39–117)
BUN: 15 mg/dL (ref 6–23)
CO2: 26 mEq/L (ref 19–32)
Calcium: 9.5 mg/dL (ref 8.4–10.5)
Chloride: 102 mEq/L (ref 96–112)
Creatinine, Ser: 0.75 mg/dL (ref 0.50–1.10)
GFR calc Af Amer: >90 mL/min (ref >90)
GFR calc non Af Amer: >90 mL/min (ref >90)
Glucose, Bld: 113 mg/dL — ABNORMAL HIGH (ref 70–99)
Potassium: 3.9 mEq/L (ref 3.5–5.1)
Sodium: 138 mEq/L (ref 135–145)
Total Bilirubin: 0.3 mg/dL (ref 0.3–1.2)
Total Protein: 6.7 g/dL (ref 6.0–8.3)

## 2013-03-25 LAB — LIPID PANEL
Cholesterol: 185 mg/dL (ref 0–200)
HDL: 52 mg/dL (ref >39)
LDL Cholesterol: 92 mg/dL (ref 0–99)
Total CHOL/HDL Ratio: 3.6 RATIO
Triglycerides: 207 mg/dL — ABNORMAL HIGH (ref <150)
VLDL: 41 mg/dL — ABNORMAL HIGH (ref 0–40)

## 2013-03-25 LAB — LACTATE DEHYDROGENASE: LDH: 160 U/L (ref 94–250)

## 2013-03-25 NOTE — Progress Notes (Signed)
[name], MD  [address]  [place] [address]  Hodgkin's lymphoma - Plan: CBC with Differential, Comprehensive metabolic panel, Sedimentation rate, CBC with Differential, Comprehensive metabolic panel, Lactate dehydrogenase, Sedimentation rate, Lactate dehydrogenase, TSH  Preventative health care - Plan: influenza vac split quadrivalent PF (FLUARIX) injection 0.5 mL  CURRENT THERAPY:Surveillance   INTERVAL HISTORY: [name] [name] 42 y.o. 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 She was then switched to dose adjusted BEACOPP x2 cycles. Her PET scan after the second cycle showed complete remission. Because of probable inflammatory changes in the lungs and a prior Citrobacter blood stream/pulmonary infection felt to be the cause of the PET scan findings in the lungs she has had Levaquin which she is now finished. Now S/P involved field radiation by Dr. Lurline Hare (Rad Onc) finishing on 11/20/2012.   Unfortunately, [name] [name] experienced some peculiar symptoms unrelated to radiation therapy and typical side effects of cytotoxic chemotherapy. Multiple diagnoses were deleted from the differential diagnosis including cardiac issues via EKG and Bleomycin toxicity via PFT with ABGs. Radiation pneumonitis was not clearly identified on CT of chest and the CT of chest was not indicative of a cause of her symptomatology. It was recently thought that this could be secondary to radiation recall and she was started on Prednisone 10 mg TID x 10 days followed by long taper over 3-4 weeks with resolution of symptomatology.   I reviewed the NCCN guidelines that provide surveillance recommendations for her malignancy.  They  are as follow: NCCN guidelines for surveillance of Hodgkin's Lymphoma (Stage I-IV) for first 5 years are as follows:  A. H+P every 3-6 months for years 1-2, then every 6-12 months until year 3, then annually.   B. Annual influenza vaccine.  C. Laboratory studies: CBC diff, ESR (if elevated at time of initial diagnosis), CMET.  TSH at least annually if RT to neck.  D. Chest x-ray of Ct every 6-12 months during first 2 years, then chest x-ray is optional.  E. Abdominal/pelvic CT every 6-12 months for first 2 years  F. Counseling: Reproduction, health habits, psychosocial, cardiovascular, breast self-exam, skin cancer risk, end-of treatment discussion.  G. Surveillance PET scan should not be done routinely due to risk for false positive.  Management decisions should not be based on PET scan alone; clinical or pathologic correlation is needed.   NCCN Guidelines for monitoring for late effects after 5 years are:  A. H+P annually  B. Annual blood pressure, aggressive management of cardiovascular risk factors  C. Pneumococcal, meningococcal, and H. Flue revaccination after 5-7 years, if patient treated with splenic RT of previous splenectomy  D. Annual influenza vaccine.  E. Cardiovascular symptoms may emerge at a young age   1. Consider stress test/echocardiogram at 10 year intervals after treatment is completed, especially if chest cardiac irradiation.   2. Consider carotid US at 10 year intervals if neck irradiation.  F. Lab studies: CBC diff, CMET annually and TSH at least annually if RT to neck and annual lipids.  G. Consider chest imaging for patients at increased risk for lung cancer.  H. Annual breast screening:  Initiate 8-10 years post-therapy, or at age 32, whichever comes first, if chest  or axillary radiation.  The NCCN Hodgkin Lymphoma Guideline s Panel recommends breast MRI in addition to mammography for women who received irradiation to the chest between ages 93 and 30 years, which is  consistent with the American Cancer Society Guidelines.  I. Counseling: reproduction, health habits, psychosocial, cardiovascular, breast self-exam, and skin cancer risk.  J. Treatment summary and consideration of transfer to PCP.  K. Consider a referral to a survivorship clinic.   I personally reviewed and went over laboratory results with the patient.  Labs on 03/25/2013 shows a CBC that is WNL, Sed rate 4, LDH of 160, and metabolic panel that is unimpressive.  From a hematoligc standpoint, she has recovered from intense chemotherapy and radiation.  We will follow NCCN guidelines for surveillance.  Dr. Alvy Bimler has ordered a PET scan for December 2014 and we will pursue this order if approved through insurance.  If not, will change restaging imaging studies to Ct CAP with contrast.  Additionally, we recommend annual mammography and annual MRI of breast.  These can be performed in an offset pattern allowing for a test every 6 months or around the same time annually.  She is planning on having mammography and MRI breast performed in December 2014.  She reports 2 complaints:  1. Continue fatigue.  This is likely treatment-induced as she had many difficulties and side effects associated with treatment.  I suspect this will slowly resolve over the next 12 months.   2. Muscle cramping of fingers and toes.  She reports that her peripheral neuropathy has resolved.  She reports that this complaint occurs at random times.  Her K+ is WNL.  I suspect this is resolving PN from chemotherapy.   She asks if she can have the shingle vaccine and this is contraindicated in her situation as it has not been studies in Hodgkin Lymphoma patients.    Past Medical History  Diagnosis Date  . Fibromyalgia   . Eczema   . Depression     parents died close together "was hard"  . Restless leg   . Heart murmur     Told 'nothing to worry about"  No 2D Echo  . Sleep apnea     Sleep Study- 1996- "little apnea- no tx"    . Headache(784.0)     Birth control pills help  . Hemorrhoid   . Anemia     As a child and while pregnant  . 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 Hypertension     recently dx with hodgkins  . Dyspnea     all the time .".due to the mass"  . Pneumonia April 2013  . CHF (congestive heart failure)     pt stated it was due to the CA but after chemo began it went away  . Dry skin   . History of radiation therapy 10/28/12-11/20/12    chest & neck,34Gy/50fx    has Fibromyalgia; Eczema; Hypertension; Dyspnea; Depression; Restless leg; Hodgkin's lymphoma; and Citrobacter infection on her problem list.     is allergic to other and sulfur.  [name] [name] does not currently have medications on file.  Past Surgical History  Procedure Laterality Date  . Wisdom tooth extraction    . Dilation and curettage of uterus  1991  . Mediastinal mass biopsy  03/05/12  . Bone marrow biopsy  03/20/2012  . Portacath placement  03/26/2012    Procedure: INSERTION PORT-A-CATH;  Surgeon: Loreli Slot, MD;  Location: [facility] OR;  Service: Thoracic;  Laterality: N/A;  . Port-a-cath removal Left 10/16/2012    Procedure: REMOVAL PORT-A-CATH;  Surgeon: Loreli Slot, MD;  Location: [facility] OR;  Service: Thoracic;  Laterality: Left;    Denies any headaches, dizziness, double vision, fevers, chills, night sweats, nausea, vomiting, diarrhea, constipation, chest pain, heart palpitations, shortness of breath, blood in stool, black tarry stool, urinary pain, urinary burning, urinary frequency, hematuria.   PHYSICAL EXAMINATION  ECOG PERFORMANCE STATUS: 1 - Symptomatic but completely ambulatory  Filed Vitals:   03/26/13 0923  BP: 130/87  Pulse: 91  Temp: 97.9 F (36.6 C)  Resp: 18    GENERAL:alert, no distress, well nourished, well developed, comfortable, cooperative, smiling SKIN: skin color, texture, turgor are normal, no rashes or significant lesions HEAD: Normocephalic, No  masses, lesions, tenderness or abnormalities EYES: normal, PERRLA, EOMI, Conjunctiva are pink and non-injected EARS: External ears normal OROPHARYNX:mucous membranes are moist  NECK: supple, no adenopathy, thyroid normal size, non-tender, without nodularity, no stridor, non-tender, trachea midline LYMPH:  no palpable lymphadenopathy, no hepatosplenomegaly BREAST:not examined LUNGS: clear to auscultation and percussion HEART: regular rate & rhythm, no murmurs, no gallops, S1 normal and S2 normal ABDOMEN:abdomen soft, non-tender, obese, normal bowel sounds, no masses or organomegaly and no hepatosplenomegaly BACK: Back symmetric, no curvature., No CVA tenderness EXTREMITIES:less then 2 second capillary refill, no joint deformities, effusion, or inflammation, 1+ B/L LE edema, no skin discoloration, no clubbing, no cyanosis  NEURO: alert & oriented x 3 with fluent speech, no focal motor/sensory deficits, gait normal   LABORATORY DATA: CBC    Component Value Date/Time   WBC 4.1 03/25/2013 0833   WBC 2.5* 11/13/2012 1552   RBC 4.16 03/25/2013 0833   RBC 3.04* 11/13/2012 1552   HGB 13.7 03/25/2013 0833   HGB 10.5* 11/13/2012 1552   HCT 38.5 03/25/2013 0833   HCT 29.6* 11/13/2012 1552   PLT 218 03/25/2013 0833   PLT 146 11/13/2012 1552   MCV 92.5 03/25/2013 0833   MCV 97.6 11/13/2012 1552   MCH 32.9 03/25/2013 0833   MCH 34.7* 11/13/2012 1552   MCHC 35.6 03/25/2013 0833   MCHC 35.6 11/13/2012 1552   RDW 11.9 03/25/2013 0833   RDW 16.3* 11/13/2012 1552   LYMPHSABS 0.4* 03/25/2013 0833   LYMPHSABS 0.1* 11/13/2012 1552   MONOABS 0.3 03/25/2013 0833   MONOABS 0.4 11/13/2012 1552   EOSABS 0.1 03/25/2013 0833   EOSABS 0.1 11/13/2012 1552   BASOSABS 0.0 03/25/2013 0833   BASOSABS 0.0 11/13/2012 1552      Chemistry      Component Value Date/Time   NA 138 03/25/2013 0833   NA 136 11/13/2012 1552   K 3.9 03/25/2013 0833   K 3.6 11/13/2012 1552   CL 102 03/25/2013 0833   CL 101 11/13/2012 1552   CO2 26  03/25/2013 0833   CO2 27 11/13/2012 1552   BUN 15 03/25/2013 0833   BUN 20.4 11/13/2012 1552   CREATININE 0.75 03/25/2013 0833   CREATININE 1.0 11/13/2012 1552      Component Value Date/Time   CALCIUM 9.5 03/25/2013 0833   CALCIUM 9.0 11/13/2012 1552   ALKPHOS 50 03/25/2013 0833   ALKPHOS 62 11/13/2012 1552   AST 18 03/25/2013 0833   AST 27 11/13/2012 1552   ALT 15 03/25/2013 0833   ALT 22 11/13/2012 1552   BILITOT 0.3 03/25/2013 0833   BILITOT 0.88 11/13/2012 1552     Results for [name] [name], [name] [name] (MRN [id number])  as of 03/25/2013 17:47  Ref. Range 03/25/2013 08:33  LDH Latest Range: 94-250 U/L 160   Results for [name] [name], [name] [name] (MRN [id number]) as of 03/25/2013 17:47  Ref. Range 03/25/2013 08:33  Cholesterol Latest Range: 0-200 mg/dL 119  Triglycerides Latest Range: <150 mg/dL 147 (H)  HDL Latest Range: >39 mg/dL 52  LDL (calc) Latest Range: 0-99 mg/dL 92  VLDL Latest Range: 0-40 mg/dL 41 (H)  Total CHOL/HDL Ratio No range found 3.6   Results for [name] [name], [name] [name] (MRN [id number]) as of 03/25/2013 17:47  Ref. Range 03/25/2013 08:33  Sed Rate Latest Range: 0-22 mm/hr 4       ASSESSMENT:  1. 1. 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 She was then switched to dose adjusted BEACOPP x2 cycles. Her PET scan after the second cycle showed complete remission. Because of probable inflammatory changes in the lungs and a prior Citrobacter blood stream/pulmonary infection felt to be the cause of the PET scan findings in the lungs she has had Levaquin which she is now finished. Now S/P involved field radiation by Dr. Lurline Hare (Rad Onc) finishing on 11/20/2012.  2. Presumed radiation recall symptoms, treated with Prednisone, resolved.  3. Fatigue, slowly  improving 4. Finger and toe muscle cramping, K+ WNL.   Patient Active Problem List   Diagnosis Date Noted  . Citrobacter infection 09/30/2012  . Hodgkin's lymphoma 03/20/2012  . Fibromyalgia 02/29/2012  . Eczema   . Hypertension   . Dyspnea   . Depression   . Restless leg      PLAN:  1. I personally reviewed and went over laboratory results with the patient. 2. I personally reviewed and went over radiographic studies with the patient. 3. Review of NCCN guidelines regarding surveillance for Hodgkins Lymphoma. 4. Dr. Mariel Sleet has ordered a PET scan for 05/26/2013 but NCCN guidelines recommends CT CAP for surveillance. If this imaging study is approved, will pursue PET scan, if not, will need to change order to CT CAP with contrast. 5. Labs in 3 +6  months: CBC diff, CMET, ESR, LDH.  TSH in 6 months.  6. Influenza vaccine today 7. Recommend annual screening mammogram and B/L breast MRI per NCCN guidelines.  8. Return in 3 months for follow-up   THERAPY PLAN:  Will follow NCCN guidelines of surveillance for [facility].  NCCN guidelines for surveillance of Hodgkin's Lymphoma (Stage I-IV) for first 5 years are as follows:  A. H+P every 3-6 months for years 1-2, then every 6-12 months until year 3, then annually.   B. Annual influenza vaccine.  C. Laboratory studies: CBC diff, ESR (if elevated at time of initial diagnosis), CMET.  TSH at least annually if RT to neck.  D. Chest x-ray of Ct every 6-12 months during first 2 years, then chest x-ray is optional.  E. Abdominal/pelvic CT every 6-12 months for first 2 years  F. Counseling: Reproduction, health habits, psychosocial, cardiovascular, breast self-exam, skin cancer risk, end-of treatment discussion.  G. Surveillance PET scan should not be done routinely due to risk for false positive.  Management decisions should not be based on PET scan alone; clinical or pathologic correlation is needed.   NCCN Guidelines for monitoring for late  effects after 5 years are:  A. H+P annually  B. Annual  blood pressure, aggressive management of cardiovascular risk factors  C. Pneumococcal, meningococcal, and H. Flue revaccination after 5-7 years, if patient treated with splenic RT of previous splenectomy  D. Annual influenza vaccine.  E. Cardiovascular symptoms may emerge at a young age   1. Consider stress test/echocardiogram at 10 year intervals after treatment is completed, especially if chest cardiac irradiation.   2. Consider carotid US at 10 year intervals if neck irradiation.  F. Lab studies: CBC diff, CMET annually and TSH at least annually if RT to neck and annual lipids.  G. Consider chest imaging for patients at increased risk for lung cancer.  H. Annual breast screening:  Initiate 8-10 years post-therapy, or at age 77, whichever comes first, if chest or axillary radiation.  The NCCN Hodgkin Lymphoma Guideline s Panel recommends breast MRI in addition to mammography for women who received irradiation to the chest between ages 54 and 30 years, which is consistent with the American Cancer Society Guidelines.  I. Counseling: reproduction, health habits, psychosocial, cardiovascular, breast self-exam, and skin cancer risk.  J. Treatment summary and consideration of transfer to PCP.  K. Consider a referral to a survivorship clinic.   All questions were answered. The patient knows to call the clinic with any problems, questions or concerns. We can certainly see the patient much sooner if necessary.  Patient and plan discussed with Dr. Alla German and he is in agreement with the aforementioned.   Allea Kassner

## 2013-03-25 NOTE — Progress Notes (Signed)
Labs drawn today for cbc/diff,lipid,sed rate, ldh,cmp

## 2013-03-26 ENCOUNTER — Encounter (HOSPITAL_COMMUNITY): Payer: Self-pay | Admitting: Oncology

## 2013-03-26 ENCOUNTER — Encounter (HOSPITAL_BASED_OUTPATIENT_CLINIC_OR_DEPARTMENT_OTHER): Payer: BC Managed Care – PPO | Admitting: Oncology

## 2013-03-26 VITALS — BP 130/87 | HR 91 | Temp 97.9°F | Resp 18 | Wt 211.0 lb

## 2013-03-26 DIAGNOSIS — C8119 Nodular sclerosis classical Hodgkin lymphoma, extranodal and solid organ sites: Secondary | ICD-10-CM

## 2013-03-26 DIAGNOSIS — Z0000 Encounter for general adult medical examination without abnormal findings: Secondary | ICD-10-CM

## 2013-03-26 DIAGNOSIS — C8190 Hodgkin lymphoma, unspecified, unspecified site: Secondary | ICD-10-CM

## 2013-03-26 DIAGNOSIS — Z23 Encounter for immunization: Secondary | ICD-10-CM

## 2013-03-26 MED ORDER — INFLUENZA VAC SPLIT QUAD 0.5 ML IM SUSP
0.5000 mL | Freq: Once | INTRAMUSCULAR | Status: AC
  Administered 2013-03-26: 0.5 mL via INTRAMUSCULAR
  Filled 2013-03-26: qty 0.5

## 2013-03-26 NOTE — Patient Instructions (Signed)
.  [facility] Discharge Instructions  RECOMMENDATIONS MADE BY THE CONSULTANT AND ANY TEST RESULTS WILL BE SENT TO YOUR REFERRING PHYSICIAN.  EXAM FINDINGS BY THE PHYSICIAN TODAY AND SIGNS OR SYMPTOMS TO REPORT TO CLINIC OR PRIMARY PHYSICIAN: Exam and findings as discussed by Jenita Seashore PA.  MEDICATIONS PRESCRIBED:  Flu vaccine today  INSTRUCTIONS/FOLLOW-UP: Labs in 3 months and 6 months Mammogram and MRI in December Pet scan in December.  Thank you for choosing [name] to provide your oncology and hematology care.  To afford each patient quality time with our providers, please arrive at least 15 minutes before your scheduled appointment time.  With your help, our goal is to use those 15 minutes to complete the necessary work-up to ensure our physicians have the information they need to help with your evaluation and healthcare recommendations.    Effective January 1st, 2014, we ask that you re-schedule your appointment with our physicians should you arrive 10 or more minutes late for your appointment.  We strive to give you quality time with our providers, and arriving late affects you and other patients whose appointments are after yours.    Again, thank you for choosing [facility].  Our hope is that these requests will decrease the amount of time that you wait before being seen by our physicians.       _____________________________________________________________  Should you have questions after your visit to [facility], please contact our office at [phone] between the hours of 8:30 a.m. and 5:00 p.m.  Voicemails left after 4:30 p.m. will not be returned until the following business day.  For prescription refill requests, have your pharmacy contact our office with your prescription refill request.

## 2013-03-26 NOTE — Progress Notes (Signed)
[name] [name] presents today for injection per MD orders. Flu vaccine 0.5 ml administered IM in right Upper Arm. Administration without incident. Patient tolerated well.

## 2013-05-15 ENCOUNTER — Other Ambulatory Visit (HOSPITAL_COMMUNITY): Payer: Self-pay | Admitting: Internal Medicine

## 2013-05-15 DIAGNOSIS — R1011 Right upper quadrant pain: Secondary | ICD-10-CM

## 2013-05-19 ENCOUNTER — Ambulatory Visit (HOSPITAL_COMMUNITY)
Admission: RE | Admit: 2013-05-19 | Discharge: 2013-05-19 | Disposition: A | Discharge status: Home / Self Care | Payer: BC Managed Care – PPO | Source: Ambulatory Visit | Attending: Internal Medicine | Admitting: Internal Medicine

## 2013-05-19 ENCOUNTER — Other Ambulatory Visit (HOSPITAL_COMMUNITY): Payer: Self-pay | Admitting: Internal Medicine

## 2013-05-19 DIAGNOSIS — I509 Heart failure, unspecified: Secondary | ICD-10-CM | POA: Insufficient documentation

## 2013-05-19 DIAGNOSIS — R1011 Right upper quadrant pain: Secondary | ICD-10-CM

## 2013-05-19 DIAGNOSIS — K828 Other specified diseases of gallbladder: Secondary | ICD-10-CM | POA: Insufficient documentation

## 2013-05-19 DIAGNOSIS — C8119 Nodular sclerosis classical Hodgkin lymphoma, extranodal and solid organ sites: Secondary | ICD-10-CM | POA: Insufficient documentation

## 2013-05-19 DIAGNOSIS — K8020 Calculus of gallbladder without cholecystitis without obstruction: Secondary | ICD-10-CM | POA: Insufficient documentation

## 2013-05-19 DIAGNOSIS — I10 Essential (primary) hypertension: Secondary | ICD-10-CM | POA: Insufficient documentation

## 2013-05-21 ENCOUNTER — Other Ambulatory Visit (HOSPITAL_COMMUNITY): Payer: Self-pay | Admitting: Oncology

## 2013-05-21 DIAGNOSIS — C8190 Hodgkin lymphoma, unspecified, unspecified site: Secondary | ICD-10-CM

## 2013-05-23 ENCOUNTER — Other Ambulatory Visit (HOSPITAL_COMMUNITY): Payer: BC Managed Care – PPO

## 2013-05-26 ENCOUNTER — Encounter (HOSPITAL_COMMUNITY)
Admission: RE | Admit: 2013-05-26 | Discharge: 2013-05-26 | Disposition: A | Discharge status: Home / Self Care | Payer: BC Managed Care – PPO | Source: Ambulatory Visit | Attending: Oncology | Admitting: Oncology

## 2013-05-26 ENCOUNTER — Telehealth (HOSPITAL_COMMUNITY): Payer: Self-pay

## 2013-05-26 DIAGNOSIS — C8190 Hodgkin lymphoma, unspecified, unspecified site: Secondary | ICD-10-CM

## 2013-05-26 LAB — GLUCOSE, CAPILLARY: Glucose-Capillary: 99 mg/dL (ref 70–99)

## 2013-05-26 MED ORDER — FLUDEOXYGLUCOSE F - 18 (FDG) INJECTION
17.7000 | Freq: Once | INTRAVENOUS | Status: AC | PRN
  Administered 2013-05-26: 17.7 via INTRAVENOUS

## 2013-05-26 NOTE — Telephone Encounter (Signed)
Message copied by Evelena Leyden on Mon May 26, 2013  4:04 PM ------      Message from: Alla German A      Created: Mon May 26, 2013  3:15 PM       Please inform Wyllow that her PET scan is NORMAL.   Residual bulk in the middle of her chest is scar remnant from her "sclerotic" Hodgkin's Disease.  Nice to spread good news. Thanks. Dr.F ------

## 2013-05-26 NOTE — Telephone Encounter (Signed)
Message copied by Evelena Leyden on Mon May 26, 2013  4:05 PM ------      Message from: Alla German A      Created: Mon May 26, 2013  3:15 PM       Please inform Lavra that her PET scan is NORMAL.   Residual bulk in the middle of her chest is scar remnant from her "sclerotic" Hodgkin's Disease.  Nice to spread good news. Thanks. Dr.F ------

## 2013-05-26 NOTE — Telephone Encounter (Signed)
Patient notified of test results 

## 2013-05-26 NOTE — Telephone Encounter (Signed)
Message copied by Evelena Leyden on Mon May 26, 2013  3:52 PM ------      Message from: Alla German A      Created: Mon May 26, 2013  3:15 PM       Please inform [name] that her PET scan is NORMAL.   Residual bulk in the middle of her chest is scar remnant from her "sclerotic" Hodgkin's Disease.  Nice to spread good news. Thanks. Dr.F ------

## 2013-05-26 NOTE — Telephone Encounter (Signed)
Message left for patient to call office

## 2013-05-30 ENCOUNTER — Other Ambulatory Visit (HOSPITAL_COMMUNITY): Payer: Self-pay | Admitting: Oncology

## 2013-06-19 ENCOUNTER — Encounter (HOSPITAL_COMMUNITY): Payer: BC Managed Care – PPO | Attending: Oncology

## 2013-06-19 DIAGNOSIS — C8190 Hodgkin lymphoma, unspecified, unspecified site: Secondary | ICD-10-CM | POA: Insufficient documentation

## 2013-06-19 LAB — CBC WITH DIFFERENTIAL/PLATELET
Basophils Absolute: 0 10*3/uL (ref 0.0–0.1)
Basophils Relative: 0 % (ref 0–1)
Eosinophils Absolute: 0.1 10*3/uL (ref 0.0–0.7)
Eosinophils Relative: 1 % (ref 0–5)
HCT: 39.3 % (ref 36.0–46.0)
Hemoglobin: 13.9 g/dL (ref 12.0–15.0)
Lymphocytes Relative: 9 % — ABNORMAL LOW (ref 12–46)
Lymphs Abs: 0.5 10*3/uL — ABNORMAL LOW (ref 0.7–4.0)
MCH: 32.9 pg (ref 26.0–34.0)
MCHC: 35.4 g/dL (ref 30.0–36.0)
MCV: 92.9 fL (ref 78.0–100.0)
Monocytes Absolute: 0.3 10*3/uL (ref 0.1–1.0)
Monocytes Relative: 7 % (ref 3–12)
Neutro Abs: 4.2 10*3/uL (ref 1.7–7.7)
Neutrophils Relative %: 83 % — ABNORMAL HIGH (ref 43–77)
Platelets: 230 10*3/uL (ref 150–400)
RBC: 4.23 MIL/uL (ref 3.87–5.11)
RDW: 12.4 % (ref 11.5–15.5)
WBC: 5.1 10*3/uL (ref 4.0–10.5)

## 2013-06-19 LAB — COMPREHENSIVE METABOLIC PANEL
ALT: 11 U/L (ref 0–35)
AST: 12 U/L (ref 0–37)
Albumin: 3.6 g/dL (ref 3.5–5.2)
Alkaline Phosphatase: 54 U/L (ref 39–117)
BUN: 14 mg/dL (ref 6–23)
CO2: 27 mEq/L (ref 19–32)
Calcium: 9.8 mg/dL (ref 8.4–10.5)
Chloride: 103 mEq/L (ref 96–112)
Creatinine, Ser: 0.82 mg/dL (ref 0.50–1.10)
GFR calc Af Amer: >90 mL/min (ref >90)
GFR calc non Af Amer: 86 mL/min — ABNORMAL LOW (ref >90)
Glucose, Bld: 92 mg/dL (ref 70–99)
Potassium: 3.8 mEq/L (ref 3.7–5.3)
Sodium: 142 mEq/L (ref 137–147)
Total Bilirubin: 0.4 mg/dL (ref 0.3–1.2)
Total Protein: 7.1 g/dL (ref 6.0–8.3)

## 2013-06-19 LAB — LACTATE DEHYDROGENASE: LDH: 135 U/L (ref 94–250)

## 2013-06-19 LAB — SEDIMENTATION RATE: Sed Rate: 8 mm/hr (ref 0–22)

## 2013-06-19 NOTE — Progress Notes (Signed)
Labs drawn today for cbc/diff,cmp,ldh,sed rate 

## 2013-06-19 NOTE — Progress Notes (Signed)
[name] Cahill, MD  [place] [address]  Hodgkin's lymphoma - Plan: CT Abdomen Pelvis W Contrast, CT Chest W Contrast  CURRENT THERAPY: Surveillance per NCCN guidelines  INTERVAL HISTORY: [name] [name] 43 y.o. 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 She was then switched to dose adjusted BEACOPP x2 cycles. Her PET scan after the second cycle showed complete remission. Because of probable inflammatory changes in the lungs and a prior Citrobacter blood stream/pulmonary infection felt to be the cause of the PET scan findings in the lungs she has had Levaquin which she is now finished. Now S/P involved field radiation by Dr. Thea Silversmith ([place]) finishing on 11/20/2012.   Unfortunately, [name] [name] experienced some peculiar symptoms unrelated to radiation therapy and typical side effects of cytotoxic chemotherapy. Multiple diagnoses were deleted from the differential diagnosis including cardiac issues via EKG and Bleomycin toxicity via PFT with ABGs. Radiation pneumonitis was not clearly identified on CT of chest and the CT of chest was not indicative of a cause of her symptomatology. It was recently thought that this could be secondary to radiation recall and she was started on Prednisone 10 mg TID x 10 days followed by long taper over 3-4 weeks with resolution of symptomatology.   I personally reviewed and went over laboratory results with the patient.  Lab results follow below.   I personally reviewed and went over radiographic studies with the patient.  PET scan demonstrates CR.  NCCN guidelines for surveillance of Hodgkin's Lymphoma (Stage I-IV) for first 5 years are as follows:  A. H+P every  3-6 months for years 1-2, then every 6-12 months until year 3, then annually.   B. Annual influenza vaccine.  C. Laboratory studies: CBC diff, ESR (if elevated at time of initial diagnosis), CMET.  TSH at least annually if RT to neck.  D. Chest x-ray of Ct every 6-12 months during first 2 years, then chest x-ray is optional.  E. Abdominal/pelvic CT every 6-12 months for first 2 years  F. Counseling: Reproduction, health habits, psychosocial, cardiovascular, breast self-exam, skin cancer risk, end-of treatment discussion.  G. Surveillance PET scan should not be done routinely due to risk for false positive.  Management decisions should not be based on PET scan alone; clinical or pathologic correlation is needed.   NCCN Guidelines for monitoring for late effects after 5 years are:  A. H+P annually  B. Annual blood pressure, aggressive management of cardiovascular risk factors  C. Pneumococcal, meningococcal, and H. Flue revaccination after 5-7 years, if patient treated with splenic RT of previous splenectomy  D. Annual influenza vaccine.  E. Cardiovascular symptoms may emerge at a young age   65. Consider stress test/echocardiogram at 10 year intervals after treatment is completed, especially if chest cardiac irradiation.   2. Consider carotid US at 10 year intervals if neck irradiation.  F. Lab studies: CBC diff, CMET annually and TSH at least annually if RT to neck and annual lipids.  G. Consider chest imaging for patients at increased risk for lung cancer.  H. Annual breast screening:  Initiate 8-10 years post-therapy, or at age 2, whichever comes first, if chest or axillary radiation.  The NCCN  Hodgkin Lymphoma Guideline s Panel recommends breast MRI in addition to mammography for women who received irradiation to the chest between ages 16 and 30 years, which is consistent with the American Cancer Society Guidelines.  I. Counseling: reproduction, health habits, psychosocial, cardiovascular,  breast self-exam, and skin cancer risk.  J. Treatment summary and consideration of transfer to PCP.  K. Consider a referral to a survivorship clinic.  [name] [name] notes that her post-chemotherapy/radiation therapy fatigue is improving daily.  She reports that she feels well without any complaints other than fatigue which is improving.  To-date, she denies any residual side effects from treatment (other than the aforementioned).  She is working full times and doing well.  She is thriving.  She denies any B Symptoms.  Hematologically, she denies any complaints and ROS questioning is negative.   Past Medical History  Diagnosis Date  . Fibromyalgia   . Eczema   . Depression     parents died close together "was hard"  . Restless leg   . Heart murmur     Told 'nothing to worry about"  No 2D Echo  . Sleep apnea     Sleep Study- 1996- "little apnea- no tx"  . Headache(784.0)     Birth control pills help  . Hemorrhoid   . Anemia     As a child and while pregnant  . 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 Hypertension     recently dx with hodgkins  . Dyspnea     all the time .".due to the mass"  . Pneumonia April 2013  . CHF (congestive heart failure)     pt stated it was due to the CA but after chemo began it went away  . Dry skin   . History of radiation therapy 10/28/12-11/20/12    chest & neck,34Gy/67f    has Fibromyalgia; Eczema; Hypertension; Dyspnea; Depression; Restless leg; Hodgkin's lymphoma; and Citrobacter infection on her problem list.     is allergic to other and sulfur.  Ms. TTellezhad no medications administered during this visit.  Past Surgical History  Procedure Laterality Date  . Wisdom tooth extraction    . Dilation and curettage of uterus  1991  . Mediastinal mass biopsy  03/05/12  . Bone marrow biopsy  03/20/2012  . Portacath placement  03/26/2012    Procedure: INSERTION PORT-A-CATH;  Surgeon: SMelrose Nakayama MD;  [place]  [facility]  Service: Thoracic;  Laterality: N/A;  . Port-a-cath removal Left 10/16/2012    Procedure: REMOVAL PORT-A-CATH;  Surgeon: SMelrose Nakayama MD;  [place] [facility]  Service: Thoracic;  Laterality: Left;    Denies any headaches, dizziness, double vision, fevers, chills, night sweats, nausea, vomiting, diarrhea, constipation, chest pain, heart palpitations, shortness of breath, blood in stool, black tarry stool, urinary pain, urinary burning, urinary frequency, hematuria.   PHYSICAL EXAMINATION  ECOG PERFORMANCE STATUS: 0 - Asymptomatic  Filed Vitals:   06/20/13 1400  BP: 108/70  Pulse: 82  Temp: 97.8 F (36.6 C)  Resp: 18    GENERAL:alert, healthy, no distress, well nourished, well developed, comfortable, cooperative, obese and smiling SKIN: skin color, texture, turgor are normal, no rashes or significant lesions HEAD: Normocephalic, No masses, lesions, tenderness or abnormalities EYES: normal, PERRLA, EOMI, Conjunctiva are pink and non-injected EARS: External ears normal OROPHARYNX:mucous membranes are moist  NECK: supple, no adenopathy, thyroid normal size, non-tender, without nodularity, no stridor, non-tender, trachea midline LYMPH:  no palpable lymphadenopathy,  no hepatosplenomegaly BREAST:not examined LUNGS: clear to auscultation and percussion HEART: regular rate & rhythm, no murmurs, no gallops, S1 normal and S2 normal ABDOMEN:abdomen soft, non-tender, obese, normal bowel sounds, no masses or organomegaly and no hepatosplenomegaly BACK: Back symmetric, no curvature. EXTREMITIES:less then 2 second capillary refill, no joint deformities, effusion, or inflammation, no skin discoloration, no clubbing, no cyanosis, positive findings:  edema 1 + in B/L LE  NEURO: alert & oriented x 3 with fluent speech, no focal motor/sensory deficits, gait normal   LABORATORY DATA: CBC    Component Value Date/Time   WBC 5.1 06/19/2013 0833   WBC 2.5* 11/13/2012 1552   RBC 4.23 06/19/2013  0833   RBC 3.04* 11/13/2012 1552   HGB 13.9 06/19/2013 0833   HGB 10.5* 11/13/2012 1552   HCT 39.3 06/19/2013 0833   HCT 29.6* 11/13/2012 1552   PLT 230 06/19/2013 0833   PLT 146 11/13/2012 1552   MCV 92.9 06/19/2013 0833   MCV 97.6 11/13/2012 1552   MCH 32.9 06/19/2013 0833   MCH 34.7* 11/13/2012 1552   MCHC 35.4 06/19/2013 0833   MCHC 35.6 11/13/2012 1552   RDW 12.4 06/19/2013 0833   RDW 16.3* 11/13/2012 1552   LYMPHSABS 0.5* 06/19/2013 0833   LYMPHSABS 0.1* 11/13/2012 1552   MONOABS 0.3 06/19/2013 0833   MONOABS 0.4 11/13/2012 1552   EOSABS 0.1 06/19/2013 0833   EOSABS 0.1 11/13/2012 1552   BASOSABS 0.0 06/19/2013 0833   BASOSABS 0.0 11/13/2012 1552      Chemistry      Component Value Date/Time   NA 142 06/19/2013 0833   NA 136 11/13/2012 1552   K 3.8 06/19/2013 0833   K 3.6 11/13/2012 1552   CL 103 06/19/2013 0833   CL 101 11/13/2012 1552   CO2 27 06/19/2013 0833   CO2 27 11/13/2012 1552   BUN 14 06/19/2013 0833   BUN 20.4 11/13/2012 1552   CREATININE 0.82 06/19/2013 0833   CREATININE 1.0 11/13/2012 1552      Component Value Date/Time   CALCIUM 9.8 06/19/2013 0833   CALCIUM 9.0 11/13/2012 1552   ALKPHOS 54 06/19/2013 0833   ALKPHOS 62 11/13/2012 1552   AST 12 06/19/2013 0833   AST 27 11/13/2012 1552   ALT 11 06/19/2013 0833   ALT 22 11/13/2012 1552   BILITOT 0.4 06/19/2013 0833   BILITOT 0.88 11/13/2012 1552     Lab Results  Component Value Date   TSH 1.745 11/13/2012   Results for [name] [name], [name] [name] (MRN [id number]) as of 06/19/2013 16:28  Ref. Range 06/19/2013 08:33  LDH Latest Range: 94-250 U/L 135   Results for [name] [name], [name] [name] (MRN [id number]) as of 06/19/2013 16:28  Ref. Range 06/19/2013 08:33  Sed Rate Latest Range: 0-22 mm/hr 8     RADIOGRAPHIC STUDIES:  05/26/2013  CLINICAL DATA: Subsequent treatment strategy for Hodgkin's  lymphoma.  EXAM:  NUCLEAR MEDICINE PET SKULL BASE TO THIGH  FASTING BLOOD GLUCOSE: Value: 71m/dl  TECHNIQUE:  17.7 mCi F-18 FDG was injected intravenously. CT data was obtained  and used for  attenuation correction and anatomic localization only.  (This was not acquired as a diagnostic CT examination.) Additional  exam technical data entered on technologist worksheet.  COMPARISON: 10/08/2012  FINDINGS:  NECK  No hypermetabolic lymph nodes in the neck.  CHEST  Left Port-A-Cath has been removed in the interval.  No hypermetabolic mediastinal or hilar nodes. The anterior  mediastinal mass measures 5.1 x 2.4 cm today compared to 5.2  x 2.5  cm previously, when measured at the same level and in the same axes  on both exams. This mass is no longer hypermetabolic with FDG uptake  at or below background soft tissue levels. SUV max = 1.8 today  compared to 3.7 previously.  No lymphadenopathy in the mediastinum. No discernible  lymphadenopathy in either hilum. Heart size is normal. There is no  pericardial or pleural effusion.  No suspicious pulmonary nodules on the CT scan. The patchy areas of  ground-glass attenuation seen in the extreme lung bases bilaterally  on the previous study have resolved completely.  ABDOMEN/PELVIS  No abnormal hypermetabolic activity within the liver, pancreas,  adrenal glands, or spleen. Spleen size has decreased slightly in the  interval, measuring 11.9 x 8.6 cm today compared to 13.3 x 9.4 cm  previously.  Gallbladder is better distended on the current study revealing the  presence of layering gallstones.  No hypermetabolic lymph nodes in the abdomen or pelvis.  SKELETON  No focal hypermetabolic activity to suggest skeletal metastasis. The  patchy FDG accumulation seen within the marrow space on the previous  study has decreased in the interval.  Tiny sclerotic focus in the T6 vertebral body is stable shows no  associated hypermetabolism. This may well represent a bone island.  IMPRESSION:  No substantial change in size of the anterior mediastinal mass  although there is no hypermetabolism within this lesion on the  current study. Soft tissue  within this lesion is at or below  background soft tissue levels on the current exam.  Interval resolution of the patchy ground-glass disease in both lung  bases, consistent with clearing of previous infection or  inflammation. .  Interval decrease in size of spleen.  No new or progressive findings.  Electronically Signed  By: Misty Stanley M.D.  On: 05/26/2013 11:22    ASSESSMENT:  1. 1. 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 She was then switched to dose adjusted BEACOPP x2 cycles. Her PET scan after the second cycle showed complete remission. Because of probable inflammatory changes in the lungs and a prior Citrobacter blood stream/pulmonary infection felt to be the cause of the PET scan findings in the lungs she has had Levaquin which she is now finished. Now S/P involved field radiation by Dr. Thea Silversmith ([place]) finishing on 11/20/2012.  2. Presumed radiation recall symptoms, treated with Prednisone, resolved.  3. Chemotherapy/Radiation-induced fatigue.  Improving significantly  Patient Active Problem List   Diagnosis Date Noted  . Citrobacter infection 09/30/2012  . Hodgkin's lymphoma 03/20/2012  . Fibromyalgia 02/29/2012  . Eczema   . Hypertension   . Dyspnea   . Depression   . Restless leg      PLAN:  1. I personally reviewed and went over laboratory results with the patient. 2. I personally reviewed and went over radiographic studies with the patient. 3. Review of NCCN guidelines 4. Labs in 3 months: CBC diff, CMET, LDH, ESR, TSH 5. Recommend annual screening mammogram and B/L breast MRI per NCCN guidelines.  6. CT CAP with contrast in June 2015 for annual surveillance 7. Return in 3 months for follow-up    THERAPY PLAN:  NCCN  guidelines for surveillance of Hodgkin's Lymphoma (Stage I-IV) for first 5 years are as follows:  A. H+P every 3-6 months for years 1-2, then every 6-12 months until year 3, then annually.   B. Annual influenza vaccine.  C. Laboratory studies: CBC diff, ESR (if elevated at time of initial diagnosis), CMET.  TSH at least annually if RT to neck.  D. Chest x-ray of Ct every 6-12 months during first 2 years, then chest x-ray is optional.  E. Abdominal/pelvic CT every 6-12 months for first 2 years  F. Counseling: Reproduction, health habits, psychosocial, cardiovascular, breast self-exam, skin cancer risk, end-of treatment discussion.  G. Surveillance PET scan should not be done routinely due to risk for false positive.  Management decisions should not be based on PET scan alone; clinical or pathologic correlation is needed.   NCCN Guidelines for monitoring for late effects after 5 years are:  A. H+P annually  B. Annual blood pressure, aggressive management of cardiovascular risk factors  C. Pneumococcal, meningococcal, and H. Flu revaccination after 5-7 years, if patient treated with splenic RT of previous splenectomy  D. Annual influenza vaccine.  E. Cardiovascular symptoms may emerge at a young age   30. Consider stress test/echocardiogram at 10 year intervals after treatment is completed, especially if chest cardiac irradiation.   2. Consider carotid US at 10 year intervals if neck irradiation.  F. Lab studies: CBC diff, CMET annually and TSH at least annually if RT to neck and annual lipids.  G. Consider chest imaging for patients at increased risk for lung cancer.  H. Annual breast screening:  Initiate 8-10 years post-therapy, or at age 3, whichever comes first, if chest or axillary radiation.  The NCCN Hodgkin Lymphoma Guideline s Panel recommends breast MRI in addition to mammography for women who received irradiation to the chest between ages 43 and 30 years, which is consistent with the  American Cancer Society Guidelines.  I. Counseling: reproduction, health habits, psychosocial, cardiovascular, breast self-exam, and skin cancer risk.  J. Treatment summary and consideration of transfer to PCP.  K. Consider a referral to a survivorship clinic.   All questions were answered. The patient knows to call the clinic with any problems, questions or concerns. We can certainly see the patient much sooner if necessary.  Patient and plan discussed with Dr. Farrel Gobble and he is in agreement with the aforementioned.   KEFALAS,THOMAS

## 2013-06-20 ENCOUNTER — Encounter (HOSPITAL_COMMUNITY): Payer: Self-pay | Admitting: Oncology

## 2013-06-20 ENCOUNTER — Encounter (HOSPITAL_BASED_OUTPATIENT_CLINIC_OR_DEPARTMENT_OTHER): Payer: BC Managed Care – PPO | Admitting: Oncology

## 2013-06-20 VITALS — BP 108/70 | HR 82 | Temp 97.8°F | Resp 18 | Wt 213.3 lb

## 2013-06-20 DIAGNOSIS — J984 Other disorders of lung: Secondary | ICD-10-CM

## 2013-06-20 DIAGNOSIS — C8119 Nodular sclerosis classical Hodgkin lymphoma, extranodal and solid organ sites: Secondary | ICD-10-CM

## 2013-06-20 DIAGNOSIS — C8190 Hodgkin lymphoma, unspecified, unspecified site: Secondary | ICD-10-CM

## 2013-06-20 NOTE — Addendum Note (Signed)
Addended by: Baird Cancer on: 06/20/2013 03:01 PM   Modules accepted: Level of Service

## 2013-06-20 NOTE — Patient Instructions (Signed)
[facility] Discharge Instructions  RECOMMENDATIONS MADE BY THE CONSULTANT AND ANY TEST RESULTS WILL BE SENT TO YOUR REFERRING PHYSICIAN.  EXAM FINDINGS BY THE PHYSICIAN TODAY AND SIGNS OR SYMPTOMS TO REPORT TO CLINIC OR PRIMARY PHYSICIAN: Exam and findings as discussed by Robynn Pane, PA-C. You are doing well.  Your blood work is stable.  Report any night sweats, fevers, new lumps, shortness of breath, unexplained weight loss, etc.  MEDICATIONS PRESCRIBED:  none  INSTRUCTIONS/FOLLOW-UP: Follow-up in 3 months with blood work and office visit.  Thank you for choosing [facility] to provide your oncology and hematology care.  To afford each patient quality time with our providers, please arrive at least 15 minutes before your scheduled appointment time.  With your help, our goal is to use those 15 minutes to complete the necessary work-up to ensure our physicians have the information they need to help with your evaluation and healthcare recommendations.    Effective January 1st, 2014, we ask that you re-schedule your appointment with our physicians should you arrive 10 or more minutes late for your appointment.  We strive to give you quality time with our providers, and arriving late affects you and other patients whose appointments are after yours.    Again, thank you for choosing [facility].  Our hope is that these requests will decrease the amount of time that you wait before being seen by our physicians.       _____________________________________________________________  Should you have questions after your visit to [facility], please contact our office at [phone] between the hours of 8:30 a.m. and 5:00 p.m.  Voicemails left after 4:30 p.m. will not be returned until the following business day.  For prescription refill requests, have your pharmacy contact our office with your prescription refill request.

## 2013-06-23 ENCOUNTER — Ambulatory Visit (HOSPITAL_COMMUNITY): Payer: BC Managed Care – PPO | Admitting: Oncology

## 2013-07-17 ENCOUNTER — Telehealth (HOSPITAL_COMMUNITY): Payer: Self-pay | Admitting: *Deleted

## 2013-07-17 NOTE — Telephone Encounter (Signed)
Patient notified that it was ok from an oncological standpoint to have her gall bladder removed. I sent an in basket to Dr. Arnoldo Morale as well as a fax to his office stating that it was ok for her to have this surgery.

## 2013-08-14 ENCOUNTER — Encounter (HOSPITAL_COMMUNITY): Payer: Self-pay | Admitting: Pharmacy Technician

## 2013-08-15 NOTE — H&P (Signed)
  NTS SOAP Note  Vital Signs:  Vitals as of: 0/06/7508: Systolic 258: Diastolic 85: Heart Rate 80: Temp 97.90F: Height 58ft 6.5in: Weight 214Lbs 0 Ounces: BMI 34.02  BMI : 34.02 kg/m2  Subjective: This 43 Years  old Female presents for of    ABDOMINAL PAIN : ,Had one episode of right upper quadrant abdominal pain radiating to the right flank, nausea, and bloating last week.  Happened after eating.  Resolved spontanously.  U/S of gallbladder revealed choleltihiasis, ?gallbladder wall thickening.  Common bile duct borderline.  Never has had these symptoms before.  No fever, chills, jaundice.  Currently feeling well.  Review of Symptoms:  Constitutional:  fatigue Head:unremarkable    Eyes:  blurred vision bilateral,pain bilateral Nose/Mouth/Throat:unremarkable Cardiovascular:  unremarkable   Respiratory:unremarkable   Gastrointestin    abdominal pain,heartburn Genitourinary:unremarkable       neck, back pain dry Hematolgic/Lymphatic:unremarkable     Allergic/Immunologic:unremarkable     Past Medical History:    Reviewed   Past Medical History  Surgical History: mass biopsy, port placement and removal Medical Problems: Hodgkin's disease, HTN Allergies: sulfa, 100% o2 Medications: lisinopril, zanaflex, robaxin   Social History:Reviewed  Social History  Preferred Language: English Race:  White Ethnicity: Not Hispanic / Latino Age: 26 Years 10 Months Marital Status:  M Alcohol: rarely Recreational drug(s):  No   Smoking Status: Never smoker reviewed on 05/29/2013 Functional Status reviewed on mm/dd/yyyy ------------------------------------------------ Bathing: Normal Cooking: Normal Dressing: Normal Driving: Normal Eating: Normal Managing Meds: Normal Oral Care: Normal Shopping: Normal Toileting: Normal Transferring: Normal Walking: Normal Cognitive Status reviewed on  mm/dd/yyyy ------------------------------------------------ Attention: Normal Decision Making: Normal Language: Normal Memory: Normal Motor: Normal Perception: Normal Problem Solving: Normal Visual and Spatial: Normal   Family History:  [place] History Mother, Deceased; Heart disease;  Father, Unknown; History Unknown    Objective Information: General:  Well appearing, well nourished in no distress.   no scleral icterus Heart:  RRR, no murmur Lungs:    CTA bilaterally, no wheezes, rhonchi, rales.  Breathing unlabored. Abdomen:Soft, NT/ND, no HSM, no masses.  Assessment:Gallstone, symptomatic    Plan: Scheduled for laparoscopic cholecystectomy on 08/29/2013. The risks and benefits of the procedure including bleeding, infection, hepatobiliary injury, the possibility of an open procedure were fully explained to the patient, who gave informed consent.  Patient Education:Alternative treatments to surgery were discussed with patient (and family).

## 2013-08-22 ENCOUNTER — Encounter (HOSPITAL_COMMUNITY)
Admission: RE | Admit: 2013-08-22 | Discharge: 2013-08-22 | Disposition: A | Discharge status: Home / Self Care | Payer: BC Managed Care – PPO | Source: Ambulatory Visit | Attending: General Surgery | Admitting: General Surgery

## 2013-08-22 ENCOUNTER — Encounter (HOSPITAL_COMMUNITY): Payer: Self-pay

## 2013-08-22 DIAGNOSIS — Z01812 Encounter for preprocedural laboratory examination: Secondary | ICD-10-CM | POA: Insufficient documentation

## 2013-08-22 LAB — BASIC METABOLIC PANEL
BUN: 13 mg/dL (ref 6–23)
CO2: 29 mEq/L (ref 19–32)
Calcium: 10 mg/dL (ref 8.4–10.5)
Chloride: 103 mEq/L (ref 96–112)
Creatinine, Ser: 0.76 mg/dL (ref 0.50–1.10)
GFR calc Af Amer: >90 mL/min (ref >90)
GFR calc non Af Amer: >90 mL/min (ref >90)
Glucose, Bld: 107 mg/dL — ABNORMAL HIGH (ref 70–99)
Potassium: 4.5 mEq/L (ref 3.7–5.3)
Sodium: 144 mEq/L (ref 137–147)

## 2013-08-22 LAB — CBC WITH DIFFERENTIAL/PLATELET
Basophils Absolute: 0 10*3/uL (ref 0.0–0.1)
Basophils Relative: 0 % (ref 0–1)
Eosinophils Absolute: 0 10*3/uL (ref 0.0–0.7)
Eosinophils Relative: 1 % (ref 0–5)
HCT: 37.9 % (ref 36.0–46.0)
Hemoglobin: 13.4 g/dL (ref 12.0–15.0)
Lymphocytes Relative: 14 % (ref 12–46)
Lymphs Abs: 0.6 10*3/uL — ABNORMAL LOW (ref 0.7–4.0)
MCH: 33 pg (ref 26.0–34.0)
MCHC: 35.4 g/dL (ref 30.0–36.0)
MCV: 93.3 fL (ref 78.0–100.0)
Monocytes Absolute: 0.2 10*3/uL (ref 0.1–1.0)
Monocytes Relative: 6 % (ref 3–12)
Neutro Abs: 3.1 10*3/uL (ref 1.7–7.7)
Neutrophils Relative %: 79 % — ABNORMAL HIGH (ref 43–77)
Platelets: 196 10*3/uL (ref 150–400)
RBC: 4.06 MIL/uL (ref 3.87–5.11)
RDW: 12.5 % (ref 11.5–15.5)
WBC: 3.9 10*3/uL — ABNORMAL LOW (ref 4.0–10.5)

## 2013-08-22 LAB — HEPATIC FUNCTION PANEL
ALT: 23 U/L (ref 0–35)
AST: 18 U/L (ref 0–37)
Albumin: 3.7 g/dL (ref 3.5–5.2)
Alkaline Phosphatase: 54 U/L (ref 39–117)
Bilirubin, Direct: <0.2 mg/dL (ref 0.0–0.3)
Total Bilirubin: 0.4 mg/dL (ref 0.3–1.2)
Total Protein: 7 g/dL (ref 6.0–8.3)

## 2013-08-22 LAB — HCG, SERUM, QUALITATIVE: Preg, Serum: NEGATIVE

## 2013-08-22 NOTE — Pre-Procedure Instructions (Signed)
Patient given information to set up mychart at home 

## 2013-08-22 NOTE — Patient Instructions (Signed)
[name] [name]  08/22/2013   Your procedure is scheduled on:  08/29/2013 Report to [facility] at  56  AM.  Call this number if you have problems the morning of surgery: [phone]   Remember:   Do not eat food or drink liquids after midnight.   Take these medicines the morning of surgery with A SIP OF WATER:  Neurontin, lisinopril, robaxin, zanaflex   Do not wear jewelry, make-up or nail polish.  Do not wear lotions, powders, or perfumes.   Do not shave 48 hours prior to surgery. Men may shave face and neck.  Do not bring valuables to the hospital.  [facility] is not responsible for any belongings or valuables.               Contacts, dentures or bridgework may not be worn into surgery.  Leave suitcase in the car. After surgery it may be brought to your room.  For patients admitted to the hospital, discharge time is determined by your treatment team.               Patients discharged the day of surgery will not be allowed to drive home.  Name and phone number of your driver: family  Special Instructions: Shower using CHG 2 nights before surgery and the night before surgery.  If you shower the day of surgery use CHG.  Use special wash - you have one bottle of CHG for all showers.  You should use approximately 1/3 of the bottle for each shower.   Please read over the following fact sheets that you were given: Pain Booklet, Coughing and Deep Breathing, Surgical Site Infection Prevention, Anesthesia Post-op Instructions and Care and Recovery After Surgery Laparoscopic Cholecystectomy Laparoscopic cholecystectomy is surgery to remove the gallbladder. The gallbladder is located in the upper right part of the abdomen, behind the liver. It is a storage sac for bile produced in the liver. Bile aids in the digestion and absorption of fats. Cholecystectomy is often done for inflammation of the gallbladder (cholecystitis). This condition is usually caused by a buildup of gallstones  (cholelithiasis) in your gallbladder. Gallstones can block the flow of bile, resulting in inflammation and pain. In severe cases, emergency surgery may be required. When emergency surgery is not required, you will have time to prepare for the procedure. Laparoscopic surgery is an alternative to open surgery. Laparoscopic surgery has a shorter recovery time. Your common bile duct may also need to be examined during the procedure. If stones are found in the common bile duct, they may be removed. LET [facility] CARE PROVIDER KNOW ABOUT:  Any allergies you have.  All medicines you are taking, including vitamins, herbs, eye drops, creams, and over-the-counter medicines.  Previous problems you or members of your family have had with the use of anesthetics.  Any blood disorders you have.  Previous surgeries you have had.  Medical conditions you have. RISKS AND COMPLICATIONS Generally, this is a safe procedure. However, as with any procedure, complications can occur. Possible complications include:  Infection.  Damage to the common bile duct, nerves, arteries, veins, or other internal organs such as the stomach, liver, or intestines.  Bleeding.  A stone may remain in the common bile duct.  A bile leak from the cyst duct that is clipped when your gallbladder is removed.  The need to convert to open surgery, which requires a larger incision in the abdomen. This may be necessary if your surgeon thinks  it is not safe to continue with a laparoscopic procedure. BEFORE THE PROCEDURE  Ask your health care provider about changing or stopping any regular medicines. You will need to stop taking aspirin or blood thinners at least 5 days prior to surgery.  Do not eat or drink anything after midnight the night before surgery.  Let your health care provider know if you develop a cold or other infectious problem before surgery. PROCEDURE   You will be given medicine to make you sleep through the  procedure (general anesthetic). A breathing tube will be placed in your mouth.  When you are asleep, your surgeon will make several small cuts (incisions) in your abdomen.  A thin, lighted tube with a tiny camera on the end (laparoscope) is inserted through one of the small incisions. The camera on the laparoscope sends a picture to a TV screen in the operating room. This gives the surgeon a good view inside your abdomen.  A gas will be pumped into your abdomen. This expands your abdomen so that the surgeon has more room to perform the surgery.  Other tools needed for the procedure are inserted through the other incisions. The gallbladder is removed through one of the incisions.  After the removal of your gallbladder, the incisions will be closed with stitches, staples, or skin glue. AFTER THE PROCEDURE  You will be taken to a recovery area where your progress will be checked often.  You may be allowed to go home the same day if your pain is controlled and you can tolerate liquids. Document Released: 05/29/2005 Document Revised: 03/19/2013 Document Reviewed: 01/08/2013 [facility] Patient Information 2014 [place]. PATIENT INSTRUCTIONS POST-ANESTHESIA  IMMEDIATELY FOLLOWING SURGERY:  Do not drive or operate machinery for the first twenty four hours after surgery.  Do not make any important decisions for twenty four hours after surgery or while taking narcotic pain medications or sedatives.  If you develop intractable nausea and vomiting or a severe headache please notify your doctor immediately.  FOLLOW-UP:  Please make an appointment with your surgeon as instructed. You do not need to follow up with anesthesia unless specifically instructed to do so.  WOUND CARE INSTRUCTIONS (if applicable):  Keep a dry clean dressing on the anesthesia/puncture wound site if there is drainage.  Once the wound has quit draining you may leave it open to air.  Generally you should leave the bandage intact  for twenty four hours unless there is drainage.  If the epidural site drains for more than 36-48 hours please call the anesthesia department.  QUESTIONS?:  Please feel free to call your physician or the hospital operator if you have any questions, and they will be happy to assist you.

## 2013-08-29 ENCOUNTER — Encounter (HOSPITAL_COMMUNITY): Payer: BC Managed Care – PPO | Admitting: Anesthesiology

## 2013-08-29 ENCOUNTER — Encounter (HOSPITAL_COMMUNITY): Payer: Self-pay | Admitting: *Deleted

## 2013-08-29 ENCOUNTER — Encounter (HOSPITAL_COMMUNITY)
Admission: RE | Disposition: A | Discharge status: Home / Self Care | Payer: Self-pay | Source: Ambulatory Visit | Attending: General Surgery

## 2013-08-29 ENCOUNTER — Ambulatory Visit (HOSPITAL_COMMUNITY)
Admission: RE | Admit: 2013-08-29 | Discharge: 2013-08-29 | Disposition: A | Discharge status: Home / Self Care | Payer: BC Managed Care – PPO | Source: Ambulatory Visit | Attending: General Surgery | Admitting: General Surgery

## 2013-08-29 ENCOUNTER — Ambulatory Visit (HOSPITAL_COMMUNITY): Payer: BC Managed Care – PPO | Admitting: Anesthesiology

## 2013-08-29 DIAGNOSIS — F3289 Other specified depressive episodes: Secondary | ICD-10-CM | POA: Insufficient documentation

## 2013-08-29 DIAGNOSIS — F329 Major depressive disorder, single episode, unspecified: Secondary | ICD-10-CM | POA: Insufficient documentation

## 2013-08-29 DIAGNOSIS — Z79899 Other long term (current) drug therapy: Secondary | ICD-10-CM | POA: Insufficient documentation

## 2013-08-29 DIAGNOSIS — I10 Essential (primary) hypertension: Secondary | ICD-10-CM | POA: Insufficient documentation

## 2013-08-29 DIAGNOSIS — K8010 Calculus of gallbladder with chronic cholecystitis without obstruction: Secondary | ICD-10-CM | POA: Insufficient documentation

## 2013-08-29 HISTORY — PX: CHOLECYSTECTOMY: SHX55

## 2013-08-29 SURGERY — LAPAROSCOPIC CHOLECYSTECTOMY
Anesthesia: General | Site: Abdomen

## 2013-08-29 MED ORDER — CHLORHEXIDINE GLUCONATE 4 % EX LIQD: 1.0000 "application " | Freq: Once | CUTANEOUS | Status: DC

## 2013-08-29 MED ORDER — ENOXAPARIN SODIUM 40 MG/0.4ML ~~LOC~~ SOLN
40.0000 mg | Freq: Once | SUBCUTANEOUS | Status: AC
  Administered 2013-08-29: 40 mg via SUBCUTANEOUS

## 2013-08-29 MED ORDER — NEOSTIGMINE METHYLSULFATE 1 MG/ML IJ SOLN
INTRAMUSCULAR | Status: AC
  Filled 2013-08-29: qty 1

## 2013-08-29 MED ORDER — FENTANYL CITRATE 0.05 MG/ML IJ SOLN
INTRAMUSCULAR | Status: DC | PRN
  Administered 2013-08-29 (×2): 25 ug via INTRAVENOUS
  Administered 2013-08-29: 100 ug via INTRAVENOUS
  Administered 2013-08-29: 25 ug via INTRAVENOUS
  Administered 2013-08-29: 50 ug via INTRAVENOUS
  Administered 2013-08-29: 25 ug via INTRAVENOUS

## 2013-08-29 MED ORDER — ONDANSETRON HCL 4 MG/2ML IJ SOLN: 4.0000 mg | Freq: Once | INTRAMUSCULAR | Status: DC | PRN

## 2013-08-29 MED ORDER — ETOMIDATE 2 MG/ML IV SOLN
INTRAVENOUS | Status: AC
  Filled 2013-08-29: qty 10

## 2013-08-29 MED ORDER — MIDAZOLAM HCL 2 MG/2ML IJ SOLN
INTRAMUSCULAR | Status: AC
  Filled 2013-08-29: qty 2

## 2013-08-29 MED ORDER — MIDAZOLAM HCL 2 MG/2ML IJ SOLN
1.0000 mg | INTRAMUSCULAR | Status: DC | PRN
  Administered 2013-08-29: 2 mg via INTRAVENOUS

## 2013-08-29 MED ORDER — DEXTROSE 5 % IV SOLN
2.0000 g | INTRAVENOUS | Status: AC
  Administered 2013-08-29: 2 g via INTRAVENOUS

## 2013-08-29 MED ORDER — GLYCOPYRROLATE 0.2 MG/ML IJ SOLN
INTRAMUSCULAR | Status: AC
  Filled 2013-08-29: qty 1

## 2013-08-29 MED ORDER — POVIDONE-IODINE 10 % OINT PACKET
TOPICAL_OINTMENT | CUTANEOUS | Status: DC | PRN
  Administered 2013-08-29: 2 via TOPICAL

## 2013-08-29 MED ORDER — PROPOFOL 10 MG/ML IV BOLUS
INTRAVENOUS | Status: DC | PRN
  Administered 2013-08-29: 20 mg via INTRAVENOUS
  Administered 2013-08-29: 150 mg via INTRAVENOUS
  Administered 2013-08-29: 10 mg via INTRAVENOUS

## 2013-08-29 MED ORDER — PROPOFOL 10 MG/ML IV EMUL
INTRAVENOUS | Status: AC
  Filled 2013-08-29: qty 20

## 2013-08-29 MED ORDER — ONDANSETRON HCL 4 MG/2ML IJ SOLN
4.0000 mg | Freq: Once | INTRAMUSCULAR | Status: AC
  Administered 2013-08-29: 4 mg via INTRAVENOUS

## 2013-08-29 MED ORDER — LACTATED RINGERS IV SOLN
INTRAVENOUS | Status: DC
  Administered 2013-08-29 (×2): via INTRAVENOUS

## 2013-08-29 MED ORDER — FENTANYL CITRATE 0.05 MG/ML IJ SOLN
25.0000 ug | INTRAMUSCULAR | Status: DC | PRN
  Administered 2013-08-29 (×2): 50 ug via INTRAVENOUS

## 2013-08-29 MED ORDER — ROCURONIUM BROMIDE 100 MG/10ML IV SOLN
INTRAVENOUS | Status: DC | PRN
  Administered 2013-08-29 (×2): 5 mg via INTRAVENOUS
  Administered 2013-08-29: 25 mg via INTRAVENOUS

## 2013-08-29 MED ORDER — FENTANYL CITRATE 0.05 MG/ML IJ SOLN
INTRAMUSCULAR | Status: AC
  Filled 2013-08-29: qty 2

## 2013-08-29 MED ORDER — GLYCOPYRROLATE 0.2 MG/ML IJ SOLN
INTRAMUSCULAR | Status: DC | PRN
  Administered 2013-08-29: 0.4 mg via INTRAVENOUS

## 2013-08-29 MED ORDER — HEMOSTATIC AGENTS (NO CHARGE) OPTIME
TOPICAL | Status: DC | PRN
  Administered 2013-08-29: 1 via TOPICAL

## 2013-08-29 MED ORDER — ARTIFICIAL TEARS OP OINT
TOPICAL_OINTMENT | OPHTHALMIC | Status: AC
  Filled 2013-08-29: qty 3.5

## 2013-08-29 MED ORDER — GLYCOPYRROLATE 0.2 MG/ML IJ SOLN
INTRAMUSCULAR | Status: AC
  Filled 2013-08-29: qty 4

## 2013-08-29 MED ORDER — LIDOCAINE HCL (PF) 1 % IJ SOLN
INTRAMUSCULAR | Status: AC
  Filled 2013-08-29: qty 5

## 2013-08-29 MED ORDER — PHENYLEPHRINE HCL 10 MG/ML IJ SOLN
INTRAMUSCULAR | Status: AC
  Filled 2013-08-29: qty 1

## 2013-08-29 MED ORDER — ONDANSETRON HCL 4 MG/2ML IJ SOLN
INTRAMUSCULAR | Status: AC
  Filled 2013-08-29: qty 2

## 2013-08-29 MED ORDER — BUPIVACAINE HCL (PF) 0.5 % IJ SOLN
INTRAMUSCULAR | Status: DC | PRN
  Administered 2013-08-29: 10 mL

## 2013-08-29 MED ORDER — EPHEDRINE SULFATE 50 MG/ML IJ SOLN
INTRAMUSCULAR | Status: AC
  Filled 2013-08-29: qty 1

## 2013-08-29 MED ORDER — CEFOXITIN SODIUM 2 G IV SOLR
INTRAVENOUS | Status: AC
  Filled 2013-08-29: qty 2

## 2013-08-29 MED ORDER — LIDOCAINE HCL (CARDIAC) 10 MG/ML IV SOLN
INTRAVENOUS | Status: DC | PRN
  Administered 2013-08-29: 20 mg via INTRAVENOUS

## 2013-08-29 MED ORDER — SODIUM CHLORIDE 0.9 % IR SOLN
Status: DC | PRN
  Administered 2013-08-29: 1000 mL

## 2013-08-29 MED ORDER — BUPIVACAINE HCL (PF) 0.5 % IJ SOLN
INTRAMUSCULAR | Status: AC
  Filled 2013-08-29: qty 30

## 2013-08-29 MED ORDER — KETOROLAC TROMETHAMINE 30 MG/ML IJ SOLN
30.0000 mg | Freq: Once | INTRAMUSCULAR | Status: AC
  Administered 2013-08-29: 30 mg via INTRAVENOUS
  Filled 2013-08-29: qty 1

## 2013-08-29 MED ORDER — OXYCODONE-ACETAMINOPHEN 7.5-325 MG PO TABS: 1.0000 | ORAL_TABLET | ORAL | Status: DC | PRN

## 2013-08-29 MED ORDER — ENOXAPARIN SODIUM 40 MG/0.4ML ~~LOC~~ SOLN
SUBCUTANEOUS | Status: AC
  Filled 2013-08-29: qty 0.4

## 2013-08-29 MED ORDER — FENTANYL CITRATE 0.05 MG/ML IJ SOLN
INTRAMUSCULAR | Status: AC
  Filled 2013-08-29: qty 5

## 2013-08-29 MED ORDER — NEOSTIGMINE METHYLSULFATE 1 MG/ML IJ SOLN
INTRAMUSCULAR | Status: DC | PRN
  Administered 2013-08-29: 2 mg via INTRAVENOUS

## 2013-08-29 MED ORDER — ROCURONIUM BROMIDE 50 MG/5ML IV SOLN
INTRAVENOUS | Status: AC
  Filled 2013-08-29: qty 2

## 2013-08-29 MED ORDER — GLYCOPYRROLATE 0.2 MG/ML IJ SOLN
0.2000 mg | Freq: Once | INTRAMUSCULAR | Status: AC
  Administered 2013-08-29: 0.2 mg via INTRAVENOUS

## 2013-08-29 MED ORDER — SODIUM CHLORIDE BACTERIOSTATIC 0.9 % IJ SOLN
INTRAMUSCULAR | Status: AC
  Filled 2013-08-29: qty 20

## 2013-08-29 MED ORDER — POVIDONE-IODINE 10 % EX OINT
TOPICAL_OINTMENT | CUTANEOUS | Status: AC
  Filled 2013-08-29: qty 2

## 2013-08-29 MED ORDER — SUCCINYLCHOLINE CHLORIDE 20 MG/ML IJ SOLN
INTRAMUSCULAR | Status: AC
  Filled 2013-08-29: qty 1

## 2013-08-29 SURGICAL SUPPLY — 46 items
APPLIER CLIP LAPSCP 10X32 DD (CLIP) ×2 IMPLANT
BAG HAMPER (MISCELLANEOUS) ×2 IMPLANT
BAG SPEC RTRVL LRG 6X4 10 (ENDOMECHANICALS) ×1
CLOTH BEACON ORANGE TIMEOUT ST (SAFETY) ×2 IMPLANT
COVER LIGHT HANDLE STERIS (MISCELLANEOUS) ×4 IMPLANT
CUTTER LINEAR ENDO 35 ETS (STAPLE) ×1 IMPLANT
DECANTER SPIKE VIAL GLASS SM (MISCELLANEOUS) ×2 IMPLANT
DISSECTOR BLUNT TIP ENDO 5MM (MISCELLANEOUS) ×1 IMPLANT
DURAPREP 26ML APPLICATOR (WOUND CARE) ×2 IMPLANT
ELECT REM PT RETURN 9FT ADLT (ELECTROSURGICAL) ×2
ELECTRODE REM PT RTRN 9FT ADLT (ELECTROSURGICAL) ×1 IMPLANT
FILTER SMOKE EVAC LAPAROSHD (FILTER) ×2 IMPLANT
FORMALIN 10 PREFIL 120ML (MISCELLANEOUS) ×2 IMPLANT
GLOVE BIO SURGEON STRL SZ7.5 (GLOVE) ×2 IMPLANT
GLOVE BIOGEL PI IND STRL 6 (GLOVE) IMPLANT
GLOVE BIOGEL PI IND STRL 8 (GLOVE) ×1 IMPLANT
GLOVE BIOGEL PI INDICATOR 6 (GLOVE) ×2
GLOVE BIOGEL PI INDICATOR 8 (GLOVE) ×1
GLOVE ECLIPSE 6.5 STRL STRAW (GLOVE) ×1 IMPLANT
GLOVE ECLIPSE 7.0 STRL STRAW (GLOVE) ×1 IMPLANT
GLOVE EXAM NITRILE MD LF STRL (GLOVE) ×1 IMPLANT
GOWN STRL REUS W/TWL LRG LVL3 (GOWN DISPOSABLE) ×6 IMPLANT
HEMOSTAT SNOW SURGICEL 2X4 (HEMOSTASIS) ×2 IMPLANT
INST SET LAPROSCOPIC AP (KITS) ×2 IMPLANT
IV NS IRRIG 3000ML ARTHROMATIC (IV SOLUTION) IMPLANT
KIT ROOM TURNOVER APOR (KITS) ×2 IMPLANT
MANIFOLD NEPTUNE II (INSTRUMENTS) ×2 IMPLANT
NDL INSUFFLATION 14GA 120MM (NEEDLE) ×1 IMPLANT
NEEDLE INSUFFLATION 14GA 120MM (NEEDLE) ×2 IMPLANT
NS IRRIG 1000ML POUR BTL (IV SOLUTION) ×2 IMPLANT
PACK LAP CHOLE LZT030E (CUSTOM PROCEDURE TRAY) ×2 IMPLANT
PAD ARMBOARD 7.5X6 YLW CONV (MISCELLANEOUS) ×2 IMPLANT
POUCH SPECIMEN RETRIEVAL 10MM (ENDOMECHANICALS) ×2 IMPLANT
SET BASIN LINEN APH (SET/KITS/TRAYS/PACK) ×2 IMPLANT
SET TUBE IRRIG SUCTION NO TIP (IRRIGATION / IRRIGATOR) IMPLANT
SLEEVE ENDOPATH XCEL 5M (ENDOMECHANICALS) ×2 IMPLANT
SPONGE GAUZE 2X2 8PLY STRL LF (GAUZE/BANDAGES/DRESSINGS) ×8 IMPLANT
STAPLER VISISTAT (STAPLE) ×2 IMPLANT
SUT VICRYL 0 UR6 27IN ABS (SUTURE) ×2 IMPLANT
TAPE CLOTH SURG 4X10 WHT LF (GAUZE/BANDAGES/DRESSINGS) ×1 IMPLANT
TROCAR ENDO BLADELESS 11MM (ENDOMECHANICALS) ×2 IMPLANT
TROCAR XCEL NON-BLD 5MMX100MML (ENDOMECHANICALS) ×2 IMPLANT
TROCAR XCEL UNIV SLVE 11M 100M (ENDOMECHANICALS) ×2 IMPLANT
TUBING INSUFFLATION (TUBING) ×2 IMPLANT
WARMER LAPAROSCOPE (MISCELLANEOUS) ×2 IMPLANT
YANKAUER SUCT 12FT TUBE ARGYLE (SUCTIONS) ×2 IMPLANT

## 2013-08-29 NOTE — Anesthesia Procedure Notes (Signed)
Procedure Name: Intubation Date/Time: 08/29/2013 7:42 AM Performed by: Vista Deck Pre-anesthesia Checklist: Patient identified, Patient being monitored, Timeout performed, Emergency Drugs available and Suction available Patient Re-evaluated:Patient Re-evaluated prior to inductionOxygen Delivery Method: Circle System Utilized Intubation Type: IV induction Ventilation: Mask ventilation without difficulty and Oral airway inserted - appropriate to patient size Laryngoscope Size: Sabra Heck and 2 Grade View: Grade I Tube type: Oral Tube size: 7.0 mm Number of attempts: 1 Airway Equipment and Method: stylet Placement Confirmation: ETT inserted through vocal cords under direct vision,  positive ETCO2 and breath sounds checked- equal and bilateral Secured at: 21 cm Tube secured with: Tape Dental Injury: Teeth and Oropharynx as per pre-operative assessment

## 2013-08-29 NOTE — Discharge Instructions (Signed)
Laparoscopic Cholecystectomy, Care After °Refer to this sheet in the next few weeks. These instructions provide you with information on caring for yourself after your procedure. Your health care provider may also give you more specific instructions. Your treatment has been planned according to current medical practices, but problems sometimes occur. Call your health care provider if you have any problems or questions after your procedure. °WHAT TO EXPECT AFTER THE PROCEDURE °After your procedure, it is typical to have the following: °· Pain at your incision sites. You will be given pain medicines to control the pain. °· Mild nausea or vomiting. This should improve after the first 24 hours. °· Bloating and possibly shoulder pain from the gas used during the procedure. This will improve after the first 24 hours. °HOME CARE INSTRUCTIONS  °· Change bandages (dressings) as directed by your health care provider. °· Keep the wound dry and clean. You may wash the wound gently with soap and water. Gently blot or dab the area dry. °· Do not take baths or use swimming pools or hot tubs for 2 weeks or until your health care provider approves. °· Only take over-the-counter or prescription medicines as directed by your health care provider. °· Continue your normal diet as directed by your health care provider. °· Do not lift anything heavier than 10 pounds (4.5 kg) until your health care provider approves. °· Do not play contact sports for 1 week or until your health care provider approves. °SEEK MEDICAL CARE IF:  °· You have redness, swelling, or increasing pain in the wound. °· You notice yellowish-white fluid (pus) coming from the wound. °· You have drainage from the wound that lasts longer than 1 day. °· You notice a bad smell coming from the wound or dressing. °· Your surgical cuts (incisions) break open. °SEEK IMMEDIATE MEDICAL CARE IF:  °· You develop a rash. °· You have difficulty breathing. °· You have chest pain. °· You  have a fever. °· You have increasing pain in the shoulders (shoulder strap areas). °· You have dizzy episodes or faint while standing. °· You have severe abdominal pain. °· You feel sick to your stomach (nauseous) or throw up (vomit) and this lasts for more than 1 day. °Document Released: 05/29/2005 Document Revised: 03/19/2013 Document Reviewed: 01/08/2013 °[employer]® Patient Information ©2014 [employer]. ° °

## 2013-08-29 NOTE — Interval H&P Note (Signed)
History and Physical Interval Note:  08/29/2013 7:23 AM  [name] [name]  has presented today for surgery, with the diagnosis of cholelithiasis  The various methods of treatment have been discussed with the patient and family. After consideration of risks, benefits and other options for treatment, the patient has consented to  Procedure(s): LAPAROSCOPIC CHOLECYSTECTOMY (N/A) as a surgical intervention .  The patient's history has been reviewed, patient examined, no change in status, stable for surgery.  I have reviewed the patient's chart and labs.  Questions were answered to the patient's satisfaction.     Aviva Signs A

## 2013-08-29 NOTE — Anesthesia Postprocedure Evaluation (Signed)
Anesthesia Post Note  Patient: [name] [name]  Procedure(s) Performed: Procedure(s) (LRB): LAPAROSCOPIC CHOLECYSTECTOMY (N/A)  Anesthesia type: General  Patient location: PACU  Post pain: Pain level controlled  Post assessment: Post-op Vital signs reviewed, Patient's Cardiovascular Status Stable, Respiratory Function Stable, Patent Airway, No signs of Nausea or vomiting and Pain level controlled  Last Vitals:  Filed Vitals:   08/29/13 0857  BP: 122/80  Pulse: 100  Temp: 36.8 C  Resp: 18    Post vital signs: Reviewed and stable  Level of consciousness: awake and alert   Complications: No apparent anesthesia complications

## 2013-08-29 NOTE — Anesthesia Preprocedure Evaluation (Signed)
Anesthesia Evaluation  Patient identified by MRN, date of birth, ID band Patient awake    Reviewed: Allergy & Precautions, H&P , NPO status , Patient's Chart, lab work & pertinent test results, reviewed documented beta blocker date and time   Airway Mallampati: II TM Distance: >3 FB Neck ROM: full    Dental   Pulmonary shortness of breath, sleep apnea , pneumonia -, resolved,  breath sounds clear to auscultation        Cardiovascular hypertension, On Medications +CHF + Valvular Problems/Murmurs Rhythm:regular     Neuro/Psych  Headaches, PSYCHIATRIC DISORDERS Depression  Neuromuscular disease    GI/Hepatic negative GI ROS, Neg liver ROS,   Endo/Other  negative endocrine ROS  Renal/GU negative Renal ROS  negative genitourinary   Musculoskeletal  (+) Fibromyalgia -  Abdominal   Peds  Hematology  (+) anemia ,   Anesthesia Other Findings Hx Bleomycin chemo Rx  Reproductive/Obstetrics negative OB ROS                           Anesthesia Physical Anesthesia Plan  ASA: III  Anesthesia Plan: General   Post-op Pain Management:    Induction: Intravenous  Airway Management Planned: Oral ETT  Additional Equipment:   Intra-op Plan:   Post-operative Plan: Extubation in OR  Informed Consent: I have reviewed the patients History and Physical, chart, labs and discussed the procedure including the risks, benefits and alternatives for the proposed anesthesia with the patient or authorized representative who has indicated his/her understanding and acceptance.     Plan Discussed with:   Anesthesia Plan Comments:         Anesthesia Quick Evaluation

## 2013-08-29 NOTE — Op Note (Signed)
Patient:  [name] [name]  DOB:  06/10/1971  MRN:  [id number]   Preop Diagnosis:  Cholecystitis, cholelithiasis  Postop Diagnosis:  Same  Procedure:  Laparoscopic cholecystectomy  Surgeon:  Aviva Signs, M.D.  Anes:  General endotracheal  Indications:  Patient is a 43 year old white female with history of lymphoma who presents with biliary colic secondary to cholelithiasis. Risks and benefits of the procedure including bleeding, infection, hepatobiliary, and the possibility of an open procedure were fully explained to the patient, who gave informed consent.  Procedure note:  The patient is placed the supine position. After induction of general endotracheal anesthesia, the abdomen was prepped and draped using usual sterile technique with DuraPrep. Surgical site confirmation was performed.  A supraumbilical incision was made down to the fascia. A Veress needle was introduced into the abdominal cavity and confirmation of placement was done using the saline drop test. The abdomen was then insufflated to 16 mm mercury pressure. An 11 mm trocar was introduced into the abdominal cavity under direct visualization without difficulty. The patient was placed in reverse Trendelenburg position and additional 11 mm trocar was placed the epigastric region and 5 mm trochars were placed the right upper quadrant and right flank regions. The liver was inspected and noted to be within normal limits. The gallbladder was retracted in a dynamic fashion in order to expose the triangle of Calot. The gallbladder was full of stones and its wall was thickened and scarred. A retrograde dome down approach was also done in order to expose the triangle. The cystic duct was identified. Its juncture to the infundibulum was fully identified. A standard Endo GIA was placed across the cystic duct and fired. The staple line was noted be intact. The cystic artery was ligated and divided without difficulty. The gallbladder was then  removed and the abdominal cavity using an Endo Catch bag without difficulty. He was sent to pathology for further examination. The gallbladder bed was inspected and no abnormal bleeding or bile leakage was noted. Surgicel is placed the gallbladder fossa. All fluid and air were then evacuated from the abdominal cavity prior to removal of the trochars.  All wounds were irrigated with normal saline. All wounds were injected with 0.5% Sensorcaine. The supraumbilical fascia as well as epigastric fascia reapproximated using 0 Vicryl interrupted sutures. All skin incisions were closed using staples. Betadine ointment and dry sterile dressings were applied.  All tape and needle counts were correct at the end of the procedure. Patient was extubated in the operating room and transferred to PACU in stable condition.  Complications:  None  EBL:  Minimal  Specimen:  Gallbladder

## 2013-08-29 NOTE — Transfer of Care (Signed)
Immediate Anesthesia Transfer of Care Note  Patient: [name] [name]  Procedure(s) Performed: Procedure(s) (LRB): LAPAROSCOPIC CHOLECYSTECTOMY (N/A)  Patient Location: PACU  Anesthesia Type: General  Level of Consciousness: awake  Airway & Oxygen Therapy: Patient Spontanous Breathing and nasal cannula  Post-op Assessment: Report given to PACU RN, Post -op Vital signs reviewed and stable and Patient moving all extremities  Post vital signs: Reviewed and stable  Complications: No apparent anesthesia complications

## 2013-09-01 ENCOUNTER — Encounter (HOSPITAL_COMMUNITY): Payer: Self-pay | Admitting: General Surgery

## 2013-09-14 ENCOUNTER — Encounter (HOSPITAL_COMMUNITY): Payer: Self-pay | Admitting: Oncology

## 2013-09-14 DIAGNOSIS — Z9049 Acquired absence of other specified parts of digestive tract: Secondary | ICD-10-CM

## 2013-09-14 HISTORY — DX: Acquired absence of other specified parts of digestive tract: Z90.49

## 2013-09-14 NOTE — Progress Notes (Signed)
[name] Cahill, MD  [place] [address]  Hodgkin's lymphoma - Plan: CBC with Differential, Comprehensive metabolic panel, CANCELED: Lactate dehydrogenase, CANCELED: Sedimentation rate  S/P cholecystectomy  CURRENT THERAPY: Surveillance per NCCN guidelines  INTERVAL HISTORY: [name] [name] 43 y.o. 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 She was then switched to dose adjusted BEACOPP x2 cycles. Her PET scan after the second cycle showed complete remission. Because of probable inflammatory changes in the lungs and a prior Citrobacter blood stream/pulmonary infection felt to be the cause of the PET scan findings in the lungs she has had Levaquin which she is now finished. Now S/P involved field radiation by Dr. Thea Silversmith ([place]) finishing on 11/20/2012.  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 She was then switched to dose adjusted BEACOPP x2 cycles. Her PET scan after the second cycle showed complete remission. Because of probable inflammatory changes in the lungs and a prior Citrobacter blood stream/pulmonary infection felt to be the cause of the PET scan findings in the lungs she has had Levaquin which she is now finished. Now S/P involved field radiation by Dr. Thea Silversmith ([place]) finishing on 11/20/2012.     Hodgkin's lymphoma   09/14/2010 Imaging Chest xray demonstrating- Possible mediastinal mass as discussed above.  Recommend chest  CT scan for further evaluation.  Further work-up by ordering physician never performed   02/26/2012 Imaging CT of chest- Large anterior mediastinal mass is identified and is worrisome for tumor.  This is encasing the great vessels and S causing significant narrowing of the left subclavian vein.   03/05/2012 Bone Marrow Biopsy Negative   03/05/2012 Initial Diagnosis Anterior mediastinal mass biopsy- Classical Hodgkin's lymphoma   03/13/2012 Imaging PET scan- hypermetabolic anterior mediastinal mass, extension of dominant mass in the low left neck, isolated small right lower jugular hypermetabolic node, no subdiaphragmatic disease.   03/27/2012 - 08/07/2012 Chemotherapy ABVD x 5 cycles but with incomplete response evaluated after cycle 3 and after cycle #5.   06/20/2012 Imaging PET scan- interval response to therapy as evidenced by decrease in size and metabolic activity involving the anterior mediastinal mass   08/16/2012 Imaging PET scan- The anterior mediastinal soft tissue mass shows no interval change in size and there is a persistent small peripheral focus of hypermetabolism within this lesion on today's exam.   08/28/2012 - 09/25/2012 Chemotherapy BEACOPP x 2 cycles   10/08/2012 Imaging PET scan- Today's study demonstrates a positive response to therapy with decrease in size and resolution of hypermetabolism within the anterior mediastinal nodal mass.  The mass currently measures 6.2 x 2.5 cm.   10/09/2012 Remission    10/23/2012 - 11/20/2012 Radiation Therapy Dr. Pablo Ledger   12/02/2012 Adverse Reaction Radiation recall- negative work-up.  Treated with Prednisone.   05/26/2013 Imaging PET scan- No substantial change in size of the anterior mediastinal mass although there is no hypermetabolism within this lesion on the current study.  Interval decrease in spleen  size.   I personally reviewed and went over laboratory results with the patient.  The results are noted within this dictation.  I personally reviewed  and went over radiographic studies with the patient.  The results are noted within this dictation.    She is S/P cholecystectomy by Dr. Arnoldo Morale on 08/29/2013.  Pathology from that is negative for any abnormalities.  She has healed nicely from that surgical intervention.  We reviewed the NCCN guidelines pertaining to surveillance for classical hodgkin's lymphoma as outlined below.   She denies any B symptoms.  She is working on weight control and was recently prescribed a medication by Dr. Nevada Crane for weight loss. She proudly reports that she has lost 5 lbs since starting that medication 1 week ago.  I admit that I am not well versed in these weight loss medications, but she notes that she is tolerating the medication well without any difficulties.   Hematologically, she denies any complaints and ROS questioning is negative.   She has settled her medical lawsuit out of court.  Past Medical History  Diagnosis Date  . Fibromyalgia   . Eczema   . Depression     parents died close together "was hard"  . Restless leg   . Heart murmur     Told 'nothing to worry about"  No 2D Echo  . Sleep apnea     Sleep Study- 1996- "little apnea- no tx"  . Headache(784.0)     Birth control pills help  . Hemorrhoid   . Anemia     As a child and while pregnant  . 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 Hypertension     recently dx with hodgkins  . Dyspnea     all the time .".due to the mass"  . Pneumonia April 2013  . CHF (congestive heart failure)     pt stated it was due to the CA but after chemo began it went away  . Dry skin   . History of radiation therapy 10/28/12-11/20/12    chest & neck,34Gy/84f  . S/P cholecystectomy 09/14/2013    08/29/2013 by Dr. JArnoldo Morale  Negative pathology    has Fibromyalgia; Eczema; Hypertension; Depression; Restless leg; Hodgkin's lymphoma; Citrobacter infection; and S/P cholecystectomy on her problem list.     is allergic to other and  sulfur.  Ms. TGilesdoes not currently have medications on file.  Past Surgical History  Procedure Laterality Date  . Wisdom tooth extraction    . Dilation and curettage of uterus  1991  . Mediastinal mass biopsy  03/05/12  . Bone marrow biopsy  03/20/2012  . Portacath placement  03/26/2012    Procedure: INSERTION PORT-A-CATH;  Surgeon: SMelrose Nakayama MD;  [facility]  Service: Thoracic;  Laterality: N/A;  . Port-a-cath removal Left 10/16/2012    Procedure: REMOVAL PORT-A-CATH;  Surgeon: SMelrose Nakayama MD;  Location[facility]  Service: Thoracic;  Laterality: Left;  . Cholecystectomy N/A 08/29/2013    Procedure: LAPAROSCOPIC CHOLECYSTECTOMY;  Surgeon: MJamesetta So MD;  Location: [facility];  Service: General;  Laterality: N/A;    Denies any headaches, dizziness, double vision, fevers, chills, night sweats, nausea, vomiting, diarrhea, constipation, chest pain, heart palpitations, shortness of breath, blood in stool, black tarry stool, urinary pain, urinary burning, urinary frequency, hematuria.   PHYSICAL EXAMINATION  ECOG PERFORMANCE STATUS: 0 - Asymptomatic  Filed Vitals:   09/17/13 0900  BP: 116/76  Pulse: 94  Temp: 97.9 F (36.6 C)  Resp: 16    GENERAL:alert, no distress, well nourished, well developed, comfortable, cooperative and smiling SKIN: skin color, texture, turgor are normal, no rashes or significant lesions HEAD: Normocephalic, No masses, lesions, tenderness or abnormalities EYES: normal, PERRLA, EOMI, Conjunctiva are pink and non-injected EARS: External ears normal OROPHARYNX:mucous membranes are moist  NECK: supple, no adenopathy, thyroid normal size, non-tender, without nodularity, no stridor, non-tender, trachea midline LYMPH:  no palpable lymphadenopathy BREAST:not examined LUNGS: clear to auscultation and percussion HEART: regular rate & rhythm, no murmurs, no gallops, S1 normal and S2 normal ABDOMEN:obese BACK: Back symmetric, no  curvature. EXTREMITIES:less then 2 second capillary refill, no joint deformities, effusion, or inflammation, no skin discoloration, no clubbing, no cyanosis  NEURO: alert & oriented x 3 with fluent speech, no focal motor/sensory deficits, gait normal   LABORATORY DATA: CBC    Component Value Date/Time   WBC 3.9* 08/22/2013 0930   WBC 2.5* 11/13/2012 1552   RBC 4.06 08/22/2013 0930   RBC 3.04* 11/13/2012 1552   HGB 13.4 08/22/2013 0930   HGB 10.5* 11/13/2012 1552   HCT 37.9 08/22/2013 0930   HCT 29.6* 11/13/2012 1552   PLT 196 08/22/2013 0930   PLT 146 11/13/2012 1552   MCV 93.3 08/22/2013 0930   MCV 97.6 11/13/2012 1552   MCH 33.0 08/22/2013 0930   MCH 34.7* 11/13/2012 1552   MCHC 35.4 08/22/2013 0930   MCHC 35.6 11/13/2012 1552   RDW 12.5 08/22/2013 0930   RDW 16.3* 11/13/2012 1552   LYMPHSABS 0.6* 08/22/2013 0930   LYMPHSABS 0.1* 11/13/2012 1552   MONOABS 0.2 08/22/2013 0930   MONOABS 0.4 11/13/2012 1552   EOSABS 0.0 08/22/2013 0930   EOSABS 0.1 11/13/2012 1552   BASOSABS 0.0 08/22/2013 0930   BASOSABS 0.0 11/13/2012 1552      Chemistry      Component Value Date/Time   NA 144 08/22/2013 0930   NA 136 11/13/2012 1552   K 4.5 08/22/2013 0930   K 3.6 11/13/2012 1552   CL 103 08/22/2013 0930   CL 101 11/13/2012 1552   CO2 29 08/22/2013 0930   CO2 27 11/13/2012 1552   BUN 13 08/22/2013 0930   BUN 20.4 11/13/2012 1552   CREATININE 0.76 08/22/2013 0930   CREATININE 1.0 11/13/2012 1552      Component Value Date/Time   CALCIUM 10.0 08/22/2013 0930   CALCIUM 9.0 11/13/2012 1552   ALKPHOS 54 08/22/2013 0930   ALKPHOS 62 11/13/2012 1552   AST 18 08/22/2013 0930   AST 27 11/13/2012 1552   ALT 23 08/22/2013 0930   ALT 22 11/13/2012 1552   BILITOT 0.4 08/22/2013 0930   BILITOT 0.88 11/13/2012 1552        ASSESSMENT:  1. Stage II A. bulky mediastinal nodular sclerosing Hodgkin's disease presenting with a large anterior mediastinal mass as well as left cervical and right cervical lymph nodes status post 5 cycles of ABVD but with  incomplete response evaluated after cycle 3 and after cycle #5. She was then switched to dose adjusted BEACOPP x2 cycles. Her PET scan after the second cycle showed complete remission. Because of probable inflammatory changes in the lungs and a prior Citrobacter blood stream/pulmonary infection felt to be the cause of the PET scan findings in the lungs she has had Levaquin which she is now finished. Now S/P involved field radiation by Dr. Thea Silversmith ([place]) finishing on 11/20/2012. 2. Suspected radiation recall, treated and resolved.  3.  S/P cholecystectomy by Dr. Arnoldo Morale on 08/29/2013.   Patient Active Problem List   Diagnosis Date Noted  . S/P cholecystectomy 09/14/2013  . Citrobacter infection 09/30/2012  . Hodgkin's lymphoma 03/20/2012  . Fibromyalgia 02/29/2012  . Eczema   . Hypertension   . Depression   . Restless leg     PLAN:  1. I personally reviewed and went over laboratory results with the patient.  The results are noted within this dictation. 2. I personally reviewed and went over radiographic studies with the patient.  The results are noted within this dictation.   3. Review of NCCN guidelines pertaining to surveillance.  4. CT CAP in 2 months as scheduled in June 2015. 5. MRI of breast and mammogram of breast in the near future. 6. Labs today as ordered: CBC diff, CMET, LDH, ESR, TSH 7. Labs in 2 months: CBC diff, CMET 8. She is having breast MRI in June 2015 and mammogram in December 2015 (and then annually for each exam).  This is per NCCN guidelines recommendations.  9. Return in 2 months for follow-up and review of CT scan results.  Moving forward, it would be reasonable to space out her appointments after her June CT scan to every 4 months or so with labs.  It is reasonable to continue with CTs every 6 months for a total of 2 years worth of surveillance.  After that, we can consider CT xrays only.     THERAPY PLAN:  NCCN guidelines for surveillance of Hodgkin's  Lymphoma (Stage I-IV) for first 5 years are as follows:  A. H+P every 3-6 months for years 1-2, then every 6-12 months until year 3, then annually.   B. Annual influenza vaccine.  C. Laboratory studies: CBC diff, ESR (if elevated at time of initial diagnosis), CMET.  TSH at least annually if RT to neck.  D. Chest x-ray of Ct every 6-12 months during first 2 years, then chest x-ray is optional.  E. Abdominal/pelvic CT every 6-12 months for first 2 years  F. Counseling: Reproduction, health habits, psychosocial, cardiovascular, breast self-exam, skin cancer risk, end-of treatment discussion.  G. Surveillance PET scan should not be done routinely due to risk for false positive.  Management decisions should not be based on PET scan alone; clinical or pathologic correlation is needed.   NCCN Guidelines for monitoring for late effects after 5 years are:  A. H+P annually  B. Annual blood pressure, aggressive management of cardiovascular risk factors  C. Pneumococcal, meningococcal, and H. Flue revaccination after 5-7 years, if patient treated with splenic RT of previous splenectomy  D. Annual influenza vaccine.  E. Cardiovascular symptoms may emerge at a young age   2. Consider stress test/echocardiogram at 10 year intervals after treatment is completed, especially if chest cardiac irradiation.   2. Consider carotid US at 10 year intervals if neck irradiation.  F. Lab studies: CBC diff, CMET annually and TSH at least annually if RT to neck and annual lipids.  G. Consider chest imaging for patients at increased risk for lung cancer.  H. Annual breast screening:  Initiate 8-10 years post-therapy, or at age 70, whichever comes first, if chest or axillary radiation.  The NCCN Hodgkin Lymphoma Guideline s Panel recommends breast MRI in addition to mammography for women who received irradiation to the chest between ages 48 and 30 years, which is consistent with the American Cancer Society Guidelines.  I.  Counseling: reproduction, health habits, psychosocial, cardiovascular, breast self-exam, and skin cancer risk.  J. Treatment summary and consideration of transfer to PCP.  K. Consider a referral to a survivorship clinic.  All questions were answered. The patient knows to call the clinic with any problems, questions or concerns. We can certainly see the patient much sooner if necessary.  Patient and plan discussed with Dr. Farrel Gobble and he is in agreement with the aforementioned.   [facility] 09/17/2013

## 2013-09-17 ENCOUNTER — Other Ambulatory Visit (HOSPITAL_COMMUNITY): Payer: Self-pay | Admitting: Oncology

## 2013-09-17 ENCOUNTER — Encounter (HOSPITAL_BASED_OUTPATIENT_CLINIC_OR_DEPARTMENT_OTHER): Payer: BC Managed Care – PPO

## 2013-09-17 ENCOUNTER — Encounter (HOSPITAL_COMMUNITY): Payer: BC Managed Care – PPO | Attending: Oncology | Admitting: Oncology

## 2013-09-17 ENCOUNTER — Encounter (HOSPITAL_COMMUNITY): Payer: Self-pay | Admitting: Oncology

## 2013-09-17 VITALS — BP 116/76 | HR 94 | Temp 97.9°F | Resp 16 | Wt 200.6 lb

## 2013-09-17 DIAGNOSIS — E876 Hypokalemia: Secondary | ICD-10-CM

## 2013-09-17 DIAGNOSIS — Z9049 Acquired absence of other specified parts of digestive tract: Secondary | ICD-10-CM

## 2013-09-17 DIAGNOSIS — C8190 Hodgkin lymphoma, unspecified, unspecified site: Secondary | ICD-10-CM

## 2013-09-17 DIAGNOSIS — C8118 Nodular sclerosis classical Hodgkin lymphoma, lymph nodes of multiple sites: Secondary | ICD-10-CM

## 2013-09-17 LAB — CBC WITH DIFFERENTIAL/PLATELET
Basophils Absolute: 0 10*3/uL (ref 0.0–0.1)
Basophils Relative: 0 % (ref 0–1)
Eosinophils Absolute: 0.2 10*3/uL (ref 0.0–0.7)
Eosinophils Relative: 4 % (ref 0–5)
HCT: 37.9 % (ref 36.0–46.0)
Hemoglobin: 13.4 g/dL (ref 12.0–15.0)
Lymphocytes Relative: 13 % (ref 12–46)
Lymphs Abs: 0.6 10*3/uL — ABNORMAL LOW (ref 0.7–4.0)
MCH: 32.5 pg (ref 26.0–34.0)
MCHC: 35.4 g/dL (ref 30.0–36.0)
MCV: 92 fL (ref 78.0–100.0)
Monocytes Absolute: 0.4 10*3/uL (ref 0.1–1.0)
Monocytes Relative: 8 % (ref 3–12)
Neutro Abs: 3.6 10*3/uL (ref 1.7–7.7)
Neutrophils Relative %: 75 % (ref 43–77)
Platelets: 259 10*3/uL (ref 150–400)
RBC: 4.12 MIL/uL (ref 3.87–5.11)
RDW: 12.1 % (ref 11.5–15.5)
WBC: 4.8 10*3/uL (ref 4.0–10.5)

## 2013-09-17 LAB — COMPREHENSIVE METABOLIC PANEL
ALT: 28 U/L (ref 0–35)
AST: 24 U/L (ref 0–37)
Albumin: 4 g/dL (ref 3.5–5.2)
Alkaline Phosphatase: 68 U/L (ref 39–117)
BUN: 20 mg/dL (ref 6–23)
CO2: 28 mEq/L (ref 19–32)
Calcium: 9.9 mg/dL (ref 8.4–10.5)
Chloride: 100 mEq/L (ref 96–112)
Creatinine, Ser: 0.94 mg/dL (ref 0.50–1.10)
GFR calc Af Amer: 84 mL/min — ABNORMAL LOW (ref >90)
GFR calc non Af Amer: 73 mL/min — ABNORMAL LOW (ref >90)
Glucose, Bld: 102 mg/dL — ABNORMAL HIGH (ref 70–99)
Potassium: 3.6 mEq/L — ABNORMAL LOW (ref 3.7–5.3)
Sodium: 141 mEq/L (ref 137–147)
Total Bilirubin: 0.5 mg/dL (ref 0.3–1.2)
Total Protein: 7.4 g/dL (ref 6.0–8.3)

## 2013-09-17 LAB — LACTATE DEHYDROGENASE: LDH: 164 U/L (ref 94–250)

## 2013-09-17 LAB — TSH: TSH: 1.78 u[IU]/mL (ref 0.350–4.500)

## 2013-09-17 LAB — SEDIMENTATION RATE: Sed Rate: 5 mm/hr (ref 0–22)

## 2013-09-17 MED ORDER — POTASSIUM CHLORIDE CRYS ER 20 MEQ PO TBCR: 20.0000 meq | EXTENDED_RELEASE_TABLET | Freq: Two times a day (BID) | ORAL | Status: DC

## 2013-09-17 NOTE — Patient Instructions (Signed)
[facility] Discharge Instructions  RECOMMENDATIONS MADE BY THE CONSULTANT AND ANY TEST RESULTS WILL BE SENT TO YOUR REFERRING PHYSICIAN.  EXAM FINDINGS BY THE PHYSICIAN TODAY AND SIGNS OR SYMPTOMS TO REPORT TO CLINIC OR PRIMARY PHYSICIAN: Exam and findings as discussed by Meriel Flavors.  MEDICATIONS PRESCRIBED:  Continue as prescribed.  INSTRUCTIONS/FOLLOW-UP: Lab work today. We will call if there are any unexpected results. Lab work again in June just prior to CT scans. CT scan as scheduled in June. MD appointment after CT scan. Report any issues/concerns to clinic as needed prior to appointments.  Thank you for choosing [facility] to provide your oncology and hematology care.  To afford each patient quality time with our providers, please arrive at least 15 minutes before your scheduled appointment time.  With your help, our goal is to use those 15 minutes to complete the necessary work-up to ensure our physicians have the information they need to help with your evaluation and healthcare recommendations.    Effective January 1st, 2014, we ask that you re-schedule your appointment with our physicians should you arrive 10 or more minutes late for your appointment.  We strive to give you quality time with our providers, and arriving late affects you and other patients whose appointments are after yours.    Again, thank you for choosing [facility].  Our hope is that these requests will decrease the amount of time that you wait before being seen by our physicians.       _____________________________________________________________  Should you have questions after your visit to [facility], please contact our office at [phone] between the hours of 8:30 a.m. and 5:00 p.m.  Voicemails left after 4:30 p.m. will not be returned until the following business day.  For prescription refill requests, have your pharmacy contact our  office with your prescription refill request.

## 2013-09-17 NOTE — Progress Notes (Signed)
Labs drawn today for cbc/diff,cmp,ldh,sed rate,tsh

## 2013-09-22 ENCOUNTER — Ambulatory Visit (HOSPITAL_COMMUNITY): Payer: BC Managed Care – PPO | Admitting: Oncology

## 2013-11-05 ENCOUNTER — Telehealth (HOSPITAL_COMMUNITY): Payer: Self-pay

## 2013-11-05 NOTE — Telephone Encounter (Signed)
Call from patient wanting to know if there was any contraindications for her taking Ribose for her fibromyalgia discomfort.  States "I've been doing some research and Ribose is suppose to decrease fibromyalgia discomfort.  I need to know if there is any reason why I cannot take this with regards to my current meds and my cancer history."

## 2013-11-07 ENCOUNTER — Encounter (HOSPITAL_COMMUNITY): Payer: Self-pay

## 2013-11-07 NOTE — Telephone Encounter (Signed)
Ribose is fine from our standpoint.  Keep in mind, Ribose is a type of sugar.

## 2013-11-10 ENCOUNTER — Telehealth (HOSPITAL_COMMUNITY): Payer: Self-pay

## 2013-11-10 ENCOUNTER — Encounter (HOSPITAL_COMMUNITY): Payer: BC Managed Care – PPO | Attending: Oncology

## 2013-11-10 DIAGNOSIS — C8190 Hodgkin lymphoma, unspecified, unspecified site: Secondary | ICD-10-CM

## 2013-11-10 DIAGNOSIS — C8119 Nodular sclerosis classical Hodgkin lymphoma, extranodal and solid organ sites: Secondary | ICD-10-CM

## 2013-11-10 LAB — CBC WITH DIFFERENTIAL/PLATELET
Basophils Absolute: 0 10*3/uL (ref 0.0–0.1)
Basophils Relative: 0 % (ref 0–1)
Eosinophils Absolute: 0.1 10*3/uL (ref 0.0–0.7)
Eosinophils Relative: 1 % (ref 0–5)
HCT: 37.2 % (ref 36.0–46.0)
Hemoglobin: 13.3 g/dL (ref 12.0–15.0)
Lymphocytes Relative: 17 % (ref 12–46)
Lymphs Abs: 0.8 10*3/uL (ref 0.7–4.0)
MCH: 32.7 pg (ref 26.0–34.0)
MCHC: 35.8 g/dL (ref 30.0–36.0)
MCV: 91.4 fL (ref 78.0–100.0)
Monocytes Absolute: 0.3 10*3/uL (ref 0.1–1.0)
Monocytes Relative: 7 % (ref 3–12)
Neutro Abs: 3.5 10*3/uL (ref 1.7–7.7)
Neutrophils Relative %: 75 % (ref 43–77)
Platelets: 241 10*3/uL (ref 150–400)
RBC: 4.07 MIL/uL (ref 3.87–5.11)
RDW: 12 % (ref 11.5–15.5)
WBC: 4.6 10*3/uL (ref 4.0–10.5)

## 2013-11-10 LAB — COMPREHENSIVE METABOLIC PANEL
ALT: 17 U/L (ref 0–35)
AST: 15 U/L (ref 0–37)
Albumin: 3.9 g/dL (ref 3.5–5.2)
Alkaline Phosphatase: 62 U/L (ref 39–117)
BUN: 16 mg/dL (ref 6–23)
CO2: 25 mEq/L (ref 19–32)
Calcium: 9.3 mg/dL (ref 8.4–10.5)
Chloride: 100 mEq/L (ref 96–112)
Creatinine, Ser: 0.82 mg/dL (ref 0.50–1.10)
GFR calc Af Amer: >90 mL/min (ref >90)
GFR calc non Af Amer: 86 mL/min — ABNORMAL LOW (ref >90)
Glucose, Bld: 85 mg/dL (ref 70–99)
Potassium: 3.5 mEq/L — ABNORMAL LOW (ref 3.7–5.3)
Sodium: 140 mEq/L (ref 137–147)
Total Bilirubin: 0.3 mg/dL (ref 0.3–1.2)
Total Protein: 7.3 g/dL (ref 6.0–8.3)

## 2013-11-10 LAB — LACTATE DEHYDROGENASE: LDH: 145 U/L (ref 94–250)

## 2013-11-10 NOTE — Progress Notes (Signed)
[name]'s reason for visit today is for labs as scheduled per MD orders.  Venipuncture performed with a 23 gauge butterfly needle to L Antecubital.  [name] [name] tolerated procedure well and without incident; questions were answered and patient was discharged.   

## 2013-11-10 NOTE — Telephone Encounter (Signed)
Areas on lower extremities are no worse but is still concerned.  Discussed with Robynn Pane, PA-C and will move scans to 11/12/13 per request of the patient.  Will come for lab work this afternoon.

## 2013-11-10 NOTE — Progress Notes (Signed)
Patient came by for evaluation of rash on lower extremities.  Noted several scattered flat reddish areas on lower extremities.  Per patient, "These look like the same red spots that I had when I was first diagnosed.  The only difference is that these are not scaly and they were before."  Requested that she update [place] on Monday.

## 2013-11-11 ENCOUNTER — Other Ambulatory Visit (HOSPITAL_COMMUNITY): Payer: Self-pay | Admitting: Oncology

## 2013-11-11 DIAGNOSIS — E876 Hypokalemia: Secondary | ICD-10-CM

## 2013-11-11 MED ORDER — POTASSIUM CHLORIDE CRYS ER 20 MEQ PO TBCR: 20.0000 meq | EXTENDED_RELEASE_TABLET | Freq: Every day | ORAL | Status: DC

## 2013-11-12 ENCOUNTER — Ambulatory Visit (HOSPITAL_COMMUNITY)
Admission: RE | Admit: 2013-11-12 | Discharge: 2013-11-12 | Disposition: A | Discharge status: Home / Self Care | Payer: BC Managed Care – PPO | Source: Ambulatory Visit | Attending: Oncology | Admitting: Oncology

## 2013-11-12 ENCOUNTER — Ambulatory Visit (HOSPITAL_COMMUNITY): Payer: BC Managed Care – PPO

## 2013-11-12 DIAGNOSIS — C8190 Hodgkin lymphoma, unspecified, unspecified site: Secondary | ICD-10-CM

## 2013-11-12 LAB — BETA 2 MICROGLOBULIN, SERUM: Beta-2 Microglobulin: 1.65 mg/L (ref <=2.51)

## 2013-11-12 MED ORDER — IOHEXOL 300 MG/ML  SOLN
100.0000 mL | Freq: Once | INTRAMUSCULAR | Status: AC | PRN
  Administered 2013-11-12: 100 mL via INTRAVENOUS

## 2013-11-13 ENCOUNTER — Encounter (HOSPITAL_COMMUNITY): Payer: Self-pay | Admitting: Oncology

## 2013-11-13 ENCOUNTER — Encounter (HOSPITAL_BASED_OUTPATIENT_CLINIC_OR_DEPARTMENT_OTHER): Payer: BC Managed Care – PPO | Admitting: Oncology

## 2013-11-13 VITALS — BP 131/77 | HR 93 | Temp 98.0°F | Resp 18 | Wt 201.2 lb

## 2013-11-13 DIAGNOSIS — C8118 Nodular sclerosis classical Hodgkin lymphoma, lymph nodes of multiple sites: Secondary | ICD-10-CM

## 2013-11-13 DIAGNOSIS — C8190 Hodgkin lymphoma, unspecified, unspecified site: Secondary | ICD-10-CM

## 2013-11-13 DIAGNOSIS — E876 Hypokalemia: Secondary | ICD-10-CM

## 2013-11-13 DIAGNOSIS — R21 Rash and other nonspecific skin eruption: Secondary | ICD-10-CM

## 2013-11-13 NOTE — Progress Notes (Signed)
[name] Cahill, MD  [place] [id number]  Hodgkin's lymphoma - Plan: gabapentin (NEURONTIN) 600 MG tablet, potassium chloride SA (K-DUR,KLOR-CON) 20 MEQ tablet, CBC with Differential, Lactate dehydrogenase, Beta 2 microglobuline, serum, Comprehensive metabolic panel, Sedimentation rate, NM PET Image Restag (PS) Skull Base To Thigh, MR Breast Bilateral W Wo Contrast, CANCELED: CBC with Differential, CANCELED: Comprehensive metabolic panel, CANCELED: Lactate dehydrogenase, CANCELED: Sedimentation rate, CANCELED: CBC with Differential, CANCELED: Comprehensive metabolic panel, CANCELED: Lactate dehydrogenase, CANCELED: Sedimentation rate, CANCELED: Beta 2 microglobuline, serum, CANCELED: MR Breast Bilateral Wo Contrast  CURRENT THERAPY: Surveillance per NCCN guidelines  INTERVAL HISTORY: [name] [name] 43 y.o. 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 She was then switched to dose adjusted BEACOPP x2 cycles. Her PET scan after the second cycle showed complete remission. Because of probable inflammatory changes in the lungs and a prior Citrobacter blood stream/pulmonary infection felt to be the cause of the PET scan findings in the lungs she has had Levaquin which she is now finished. Now S/P involved field radiation by Dr. Thea Silversmith ([place]) finishing on 11/20/2012.  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 She was then switched to dose adjusted BEACOPP  x2 cycles. Her PET scan after the second cycle showed complete remission. Because of probable inflammatory changes in the lungs and a prior Citrobacter blood stream/pulmonary infection felt to be the cause of the PET scan findings in the lungs she has had Levaquin which she is now finished. Now S/P involved field radiation by Dr. Thea Silversmith ([place]) finishing on 11/20/2012.     Hodgkin's lymphoma   09/14/2010 Imaging Chest xray demonstrating- Possible mediastinal mass as discussed above.  Recommend chest CT scan for further evaluation.  Further work-up by ordering physician never performed   02/26/2012 Imaging CT of chest- Large anterior mediastinal mass is identified and is worrisome for tumor.  This is encasing the great vessels and S causing significant narrowing of the left subclavian vein.   03/05/2012 Bone Marrow Biopsy Negative   03/05/2012 Initial Diagnosis Anterior mediastinal mass biopsy- Classical Hodgkin's lymphoma   03/13/2012 Imaging PET scan- hypermetabolic anterior mediastinal mass, extension of dominant mass in the low left neck, isolated small right lower jugular hypermetabolic node, no subdiaphragmatic disease.   03/27/2012 - 08/07/2012 Chemotherapy ABVD x 5 cycles but with incomplete response evaluated after cycle 3 and after cycle #5.   06/20/2012 Imaging PET scan- interval response to therapy as evidenced by decrease in size and metabolic activity involving the anterior mediastinal mass   08/16/2012 Imaging PET scan- The anterior mediastinal soft tissue mass shows no interval change in size and there is a persistent small peripheral focus of hypermetabolism within this lesion on today's exam.   08/28/2012 - 09/25/2012 Chemotherapy BEACOPP x 2 cycles   10/08/2012 Imaging PET scan- Today's study demonstrates a positive response to therapy with decrease in size and resolution of hypermetabolism within the anterior mediastinal nodal mass.  The mass  currently measures 6.2 x 2.5 cm.   10/09/2012  Remission    10/23/2012 - 11/20/2012 Radiation Therapy Dr. Pablo Ledger   12/02/2012 Adverse Reaction Radiation recall- negative work-up.  Treated with Prednisone.   05/26/2013 Imaging PET scan- No substantial change in size of the anterior mediastinal mass although there is no hypermetabolism within this lesion on the current study.  Interval decrease in spleen size.   11/12/2013 Imaging CT CAP- decrease size in medistinal mass.  NED.   I personally reviewed and went over laboratory results with the patient.  The results are noted within this dictation.  I personally reviewed and went over radiographic studies with the patient.  The results are noted within this dictation.  CT CAP performed on 11/12/2013 demonstrates: 1. Mild decrease in size of anterior mediastinal soft tissue.  2. No new or progressive abnormalities identified within the chest,  abdomen or pelvis.  3. Atherosclerotic disease.  "I'm going to pick your brain:" 1. How sensitive is lab work- LDH and B2M is thought to be sensitive for Hodgkin's lymphoma, but it is not specific.  The best studies are radiographic and clinical. 2. Why is my K+ low- she is on Lisinopril-HCTZ and this is not K+ sparing.  She reports that she has recently stopped that medication as she believes the HCTZ is causing some of her symptoms.  I will defer to her primary care provider.  3. "I am decreasing my Gabapentin"- I educated her on a healthy taper schedule.  She is taking 300 mg daily and has been doing so x 7 days.  It is reasonable to stop the medication at any time.  4. "Can I take Ribose for my fibromyalgia"- from an oncology standpoint, there is no reason why she cannot take this supplement, but she was educated on the fact that it is a type of sugar and she should be mindful about the issues associated with sugar intake.  5. "When I was diagnosed with cancer, I had 'splotches' on my legs and Dr. Tressie Stalker was concerned that the rash was Hodgkin's Lymphoma"-  So.Marland KitchenMarland KitchenHodgkin's lymphoma can has cutaneous manifestations, but that usually occurs after there is disease elsewhere within the body.  Her "splotches" are improved since last week and nearly resolved on their own.  They are nonspecific.  I have offered her a dermatology consult, but she has declined at this time.  6. "Am I at risk of recurrence"- I explained that due to her history of Hodgkin's Lymphoma, she will always be at increased risk of recurrence, but as years pass by without any evidence of recurrence, her risk declines.  With this being said, she was educated regarding the increased risk of secondary malignancy given her intense chemotherapy and radiation therapy.  7. "What do I need to do or can do to decrease my risks"- She is educated regarding healthy eating choices, which she is doing, and other behavioral choices including smoking cessation, etc.  Otherwise, ROS questioning is negative.    Past Medical History  Diagnosis Date  . Fibromyalgia   . Eczema   . Depression     parents died close together "was hard"  . Restless leg   . Heart murmur     Told 'nothing to worry about"  No 2D Echo  . Sleep apnea     Sleep Study- 1996- "little apnea- no tx"  . Headache(784.0)     Birth control pills help  . Hemorrhoid   . Anemia     As  a child and while pregnant  . 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 Hypertension     recently dx with hodgkins  . Dyspnea     all the time .".due to the mass"  . Pneumonia April 2013  . CHF (congestive heart failure)     pt stated it was due to the CA but after chemo began it went away  . Dry skin   . History of radiation therapy 10/28/12-11/20/12    chest & neck,34Gy/109f  . S/P cholecystectomy 09/14/2013    08/29/2013 by Dr. JArnoldo Morale  Negative pathology    has Fibromyalgia; Eczema; Hypertension; Depression; Restless leg; Hodgkin's lymphoma; Citrobacter infection; and S/P cholecystectomy on her problem list.     is  allergic to other and sulfur.  Ms. TMontuorihad no medications administered during this visit.  Past Surgical History  Procedure Laterality Date  . Wisdom tooth extraction    . Dilation and curettage of uterus  1991  . Mediastinal mass biopsy  03/05/12  . Bone marrow biopsy  03/20/2012  . Portacath placement  03/26/2012    Procedure: INSERTION PORT-A-CATH;  Surgeon: SMelrose Nakayama MD;  [facility]  Service: Thoracic;  Laterality: N/A;  . Port-a-cath removal Left 10/16/2012    Procedure: REMOVAL PORT-A-CATH;  Surgeon: SMelrose Nakayama MD;  [facility] [facility]  Service: Thoracic;  Laterality: Left;  . Cholecystectomy N/A 08/29/2013    Procedure: LAPAROSCOPIC CHOLECYSTECTOMY;  Surgeon: MJamesetta So MD;  Location: [facility];  Service: General;  Laterality: N/A;    Denies any headaches, dizziness, double vision, fevers, chills, night sweats, nausea, vomiting, diarrhea, constipation, chest pain, heart palpitations, shortness of breath, blood in stool, black tarry stool, urinary pain, urinary burning, urinary frequency, hematuria.   PHYSICAL EXAMINATION  ECOG PERFORMANCE STATUS: 0 - Asymptomatic  Filed Vitals:   11/13/13 1000  BP: 131/77  Pulse: 93  Temp: 98 F (36.7 C)  Resp: 18    GENERAL:alert, no distress, well nourished, well developed, comfortable, cooperative, obese and smiling SKIN: positive for: Two 1 cm areas on left lateral lower leg that are minimally erythematous, flat, with vague borders that are blanchable. HEAD: Normocephalic, No masses, lesions, tenderness or abnormalities EYES: normal, PERRLA, EOMI, Conjunctiva are pink and non-injected EARS: External ears normal OROPHARYNX:mucous membranes are moist  NECK: supple, trachea midline LYMPH:  not examined BREAST:not examined LUNGS: not examined HEART: not examined ABDOMEN:not examined BACK: Back symmetric, no curvature. EXTREMITIES:less then 2 second capillary refill, no clubbing, no cyanosis, positive  findings:  edema B/L 1+ pitting ankle edema  NEURO: alert & oriented x 3 with fluent speech, no focal motor/sensory deficits, gait normal   LABORATORY DATA: CBC    Component Value Date/Time   WBC 4.6 11/10/2013 1600   WBC 2.5* 11/13/2012 1552   RBC 4.07 11/10/2013 1600   RBC 3.04* 11/13/2012 1552   HGB 13.3 11/10/2013 1600   HGB 10.5* 11/13/2012 1552   HCT 37.2 11/10/2013 1600   HCT 29.6* 11/13/2012 1552   PLT 241 11/10/2013 1600   PLT 146 11/13/2012 1552   MCV 91.4 11/10/2013 1600   MCV 97.6 11/13/2012 1552   MCH 32.7 11/10/2013 1600   MCH 34.7* 11/13/2012 1552   MCHC 35.8 11/10/2013 1600   MCHC 35.6 11/13/2012 1552   RDW 12.0 11/10/2013 1600   RDW 16.3* 11/13/2012 1552   LYMPHSABS 0.8 11/10/2013 1600   LYMPHSABS 0.1* 11/13/2012 1552   MONOABS 0.3 11/10/2013 1600  MONOABS 0.4 11/13/2012 1552   EOSABS 0.1 11/10/2013 1600   EOSABS 0.1 11/13/2012 1552   BASOSABS 0.0 11/10/2013 1600   BASOSABS 0.0 11/13/2012 1552      Chemistry      Component Value Date/Time   NA 140 11/10/2013 1600   NA 136 11/13/2012 1552   K 3.5* 11/10/2013 1600   K 3.6 11/13/2012 1552   CL 100 11/10/2013 1600   CL 101 11/13/2012 1552   CO2 25 11/10/2013 1600   CO2 27 11/13/2012 1552   BUN 16 11/10/2013 1600   BUN 20.4 11/13/2012 1552   CREATININE 0.82 11/10/2013 1600   CREATININE 1.0 11/13/2012 1552      Component Value Date/Time   CALCIUM 9.3 11/10/2013 1600   CALCIUM 9.0 11/13/2012 1552   ALKPHOS 62 11/10/2013 1600   ALKPHOS 62 11/13/2012 1552   AST 15 11/10/2013 1600   AST 27 11/13/2012 1552   ALT 17 11/10/2013 1600   ALT 22 11/13/2012 1552   BILITOT 0.3 11/10/2013 1600   BILITOT 0.88 11/13/2012 1552     Results for [name] [name], [name] [name] (MRN [id number]) as of 11/13/2013 11:45  Ref. Range 11/10/2013 16:00  LDH Latest Range: 94-250 U/L 145   Results for [name] [name], [name] [name] (MRN [id number]) as of 11/13/2013 11:45  Ref. Range 11/10/2013 16:00  Beta-2 Microglobulin Latest Range: <=2.51 mg/L 1.65     RADIOGRAPHIC STUDIES:  Ct Chest W Contrast  11/12/2013   CLINICAL DATA:  Hodgkin's  lymphoma.  EXAM: CT CHEST, ABDOMEN, AND PELVIS WITH CONTRAST  TECHNIQUE: Multidetector CT imaging of the chest, abdomen and pelvis was performed following the standard protocol during bolus administration of intravenous contrast.  CONTRAST:  148m OMNIPAQUE IOHEXOL 300 MG/ML  SOLN  COMPARISON:  05/26/2013.  FINDINGS: CT CHEST FINDINGS  The heart size appears normal. There is no pericardial effusion identified. Pre-vascular/anterior mediastinal soft tissue adjacent to the transverse aortic arch measures 1.4 x 5.6 cm, image 14/ series 2. Previously 1.9 x 6.0 cm no new or progressive mediastinal or hilar adenopathy. No axillary or supraclavicular adenopathy.  Lungs are clear. No airspace consolidation or atelectasis. No suspicious pulmonary nodule or mass noted.  Review of the visualized bony structures is negative for aggressive lytic or sclerotic bone lesion.  CT ABDOMEN AND PELVIS FINDINGS  There is no liver abnormality identified. The patient is status post cholecystectomy. The spleen appears normal.  Normal appearance of the adrenal glands. The kidneys are both on unremarkable. Urinary bladder appears normal. The uterus and adnexal structures are on unremarkable.  There is calcified atherosclerotic disease involving the abdominal aorta. No aneurysm. No pathologically enlarged upper abdominal lymph nodes identified. There is no pelvic or inguinal adenopathy noted.  The stomach is normal. The small bowel loops are on unremarkable. The appendix is normal. Normal appearance of the colon.  Review of the visualized osseous structures is significant for mild spondylosis within the lumbar spine.  IMPRESSION: 1. Mild decrease in size of anterior mediastinal soft tissue. 2. No new or progressive abnormalities identified within the chest, abdomen or pelvis. 3. Atherosclerotic disease.   Electronically Signed   By: TKerby MoorsM.D.   On: 11/12/2013 16:40   Ct Abdomen Pelvis W Contrast  11/12/2013   CLINICAL DATA:   Hodgkin's lymphoma.  EXAM: CT CHEST, ABDOMEN, AND PELVIS WITH CONTRAST  TECHNIQUE: Multidetector CT imaging of the chest, abdomen and pelvis was performed following the standard protocol during bolus administration of intravenous contrast.  CONTRAST:  1039mOMNIPAQUE IOHEXOL  300 MG/ML  SOLN  COMPARISON:  05/26/2013.  FINDINGS: CT CHEST FINDINGS  The heart size appears normal. There is no pericardial effusion identified. Pre-vascular/anterior mediastinal soft tissue adjacent to the transverse aortic arch measures 1.4 x 5.6 cm, image 14/ series 2. Previously 1.9 x 6.0 cm no new or progressive mediastinal or hilar adenopathy. No axillary or supraclavicular adenopathy.  Lungs are clear. No airspace consolidation or atelectasis. No suspicious pulmonary nodule or mass noted.  Review of the visualized bony structures is negative for aggressive lytic or sclerotic bone lesion.  CT ABDOMEN AND PELVIS FINDINGS  There is no liver abnormality identified. The patient is status post cholecystectomy. The spleen appears normal.  Normal appearance of the adrenal glands. The kidneys are both on unremarkable. Urinary bladder appears normal. The uterus and adnexal structures are on unremarkable.  There is calcified atherosclerotic disease involving the abdominal aorta. No aneurysm. No pathologically enlarged upper abdominal lymph nodes identified. There is no pelvic or inguinal adenopathy noted.  The stomach is normal. The small bowel loops are on unremarkable. The appendix is normal. Normal appearance of the colon.  Review of the visualized osseous structures is significant for mild spondylosis within the lumbar spine.  IMPRESSION: 1. Mild decrease in size of anterior mediastinal soft tissue. 2. No new or progressive abnormalities identified within the chest, abdomen or pelvis. 3. Atherosclerotic disease.   Electronically Signed   By: Kerby Moors M.D.   On: 11/12/2013 16:40     ASSESSMENT:  1. Stage II A. bulky mediastinal  nodular sclerosing Hodgkin's disease presenting with a large anterior mediastinal mass as well as left cervical and right cervical lymph nodes status post 5 cycles of ABVD but with incomplete response evaluated after cycle 3 and after cycle #5. She was then switched to dose adjusted BEACOPP x2 cycles. Her PET scan after the second cycle showed complete remission. Because of probable inflammatory changes in the lungs and a prior Citrobacter blood stream/pulmonary infection felt to be the cause of the PET scan findings in the lungs she has had Levaquin which she is now finished. Now S/P involved field radiation by Dr. Thea Silversmith ([place]) finishing on 11/20/2012. 2. Suspected radiation recall, treated and resolved.  3. S/P cholecystectomy by Dr. Arnoldo Morale on 08/29/2013.  4. Fibromyalgia 5. Hypokalemia, secondary to HCTZ 6. Skin rash- as described above, nonspecific  Patient Active Problem List   Diagnosis Date Noted  . S/P cholecystectomy 09/14/2013  . Citrobacter infection 09/30/2012  . Hodgkin's lymphoma 03/20/2012  . Fibromyalgia 02/29/2012  . Eczema   . Hypertension   . Depression   . Restless leg     PLAN:  1. I personally reviewed and went over laboratory results with the patient.  The results are noted within this dictation. 2. I personally reviewed and went over radiographic studies with the patient.  The results are noted within this dictation.   3. Long discussion as dicated above 4. Labs monthly: CBC diff, LDH, B2M, ESR 5. Labs in 3 months: CBC diff, CMET, LDH, ESR, B2M 6. MRI breast w wo contrast for breast cancer surveillance given her mediastinal radiation and per NCCN guidelines 7. PET scan in 6 months for surveillance and evaluate mediastinal mass for hypermetabolic activity. 8. Patient education regarding her recurrence risk and treatment associated side effects.  9. Patient declined dermatology referral. 10. Return in 3 months for follow-up.   THERAPY PLAN:  We will  continue with surveillance per NCCN guidelines, but the patient is nervous/anxious  about her minimal and nonspecific rash on her right leg.  As a result, we will increase the frequency of lab work.  She has declined a referral to dermatology.  She is due for her screening breast MRI and this will be scheduled this month.  In 6 months, we will perform a PET scan to evaluate her mediastinal mass for hypermetabolic activity.  NCCN guidelines for surveillance of Hodgkin's Lymphoma (Stage I-IV) for first 5 years are as follows:  A. H+P every 3-6 months for years 1-2, then every 6-12 months until year 3, then annually.   B. Annual influenza vaccine.  C. Laboratory studies: CBC diff, ESR (if elevated at time of initial diagnosis), CMET.  TSH at least annually if RT to neck.  D. Chest x-ray of Ct every 6-12 months during first 2 years, then chest x-ray is optional.  E. Abdominal/pelvic CT every 6-12 months for first 2 years  F. Counseling: Reproduction, health habits, psychosocial, cardiovascular, breast self-exam, skin cancer risk, end-of treatment discussion.  G. Surveillance PET scan should not be done routinely due to risk for false positive.  Management decisions should not be based on PET scan alone; clinical or pathologic correlation is needed.   NCCN Guidelines for monitoring for late effects after 5 years are:  A. H+P annually  B. Annual blood pressure, aggressive management of cardiovascular risk factors  C. Pneumococcal, meningococcal, and H. Flue revaccination after 5-7 years, if patient treated with splenic RT of previous splenectomy  D. Annual influenza vaccine.  E. Cardiovascular symptoms may emerge at a young age   74. Consider stress test/echocardiogram at 10 year intervals after treatment is completed, especially if chest cardiac irradiation.   2. Consider carotid US at 10 year intervals if neck irradiation.  F. Lab studies: CBC diff, CMET annually and TSH at least annually if RT to neck  and annual lipids.  G. Consider chest imaging for patients at increased risk for lung cancer.  H. Annual breast screening:  Initiate 8-10 years post-therapy, or at age 71, whichever comes first, if chest or axillary radiation.  The NCCN Hodgkin Lymphoma Guideline s Panel recommends breast MRI in addition to mammography for women who received irradiation to the chest between ages 45 and 30 years, which is consistent with the American Cancer Society Guidelines.  I. Counseling: reproduction, health habits, psychosocial, cardiovascular, breast self-exam, and skin cancer risk.  J. Treatment summary and consideration of transfer to PCP.  K. Consider a referral to a survivorship clinic.  All questions were answered. The patient knows to call the clinic with any problems, questions or concerns. We can certainly see the patient much sooner if necessary.  Patient and plan discussed with Dr. Farrel Gobble and he is in agreement with the aforementioned.   I spent 25 minutes counseling the patient face to face. The total time spent in the appointment was 35 minutes.  More than 50% of the time spent with the patient was utilized for counseling and coordination of care.   [facility] 11/13/2013

## 2013-11-13 NOTE — Patient Instructions (Signed)
[facility] Discharge Instructions  RECOMMENDATIONS MADE BY THE CONSULTANT AND ANY TEST RESULTS WILL BE SENT TO YOUR REFERRING PHYSICIAN.  EXAM FINDINGS BY THE PHYSICIAN TODAY AND SIGNS OR SYMPTOMS TO REPORT TO CLINIC OR PRIMARY PHYSICIAN: Exam and findings as discussed by [name] Pane, PA-C.  CT scans and labs are good.  Report night sweats, fevers, new lumps, shortness of breath, unexplained weight loss, etc.  MEDICATIONS PRESCRIBED:  none  INSTRUCTIONS/FOLLOW-UP: Labs monthly,  MRI of breast to be scheduled, Office visit in 3 months and PET scan 05/15/14 at [facility] arrive at 6:45am.  Nothing to eat or drink after midnight, no candy, gum, nothing with sugar in it.  Thank you for choosing [facility] to provide your oncology and hematology care.  To afford each patient quality time with our providers, please arrive at least 15 minutes before your scheduled appointment time.  With your help, our goal is to use those 15 minutes to complete the necessary work-up to ensure our physicians have the information they need to help with your evaluation and healthcare recommendations.    Effective January 1st, 2014, we ask that you re-schedule your appointment with our physicians should you arrive 10 or more minutes late for your appointment.  We strive to give you quality time with our providers, and arriving late affects you and other patients whose appointments are after yours.    Again, thank you for choosing [facility].  Our hope is that these requests will decrease the amount of time that you wait before being seen by our physicians.       _____________________________________________________________  Should you have questions after your visit to [facility], please contact our office at [phone] between the hours of 8:30 a.m. and 5:00 p.m.  Voicemails left after 4:30 p.m. will not be returned until the following business day.   For prescription refill requests, have your pharmacy contact our office with your prescription refill request.

## 2013-11-24 ENCOUNTER — Other Ambulatory Visit (HOSPITAL_COMMUNITY): Payer: BC Managed Care – PPO

## 2013-11-25 ENCOUNTER — Ambulatory Visit (HOSPITAL_COMMUNITY): Payer: BC Managed Care – PPO

## 2013-11-26 NOTE — Progress Notes (Signed)
This encounter was created in error - please disregard.

## 2013-12-08 ENCOUNTER — Encounter (HOSPITAL_COMMUNITY): Payer: BC Managed Care – PPO

## 2013-12-08 ENCOUNTER — Encounter (HOSPITAL_BASED_OUTPATIENT_CLINIC_OR_DEPARTMENT_OTHER): Payer: BC Managed Care – PPO

## 2013-12-08 DIAGNOSIS — C8190 Hodgkin lymphoma, unspecified, unspecified site: Secondary | ICD-10-CM

## 2013-12-08 LAB — CBC WITH DIFFERENTIAL/PLATELET
Basophils Absolute: 0 10*3/uL (ref 0.0–0.1)
Basophils Relative: 0 % (ref 0–1)
Eosinophils Absolute: 0.1 10*3/uL (ref 0.0–0.7)
Eosinophils Relative: 1 % (ref 0–5)
HCT: 37.9 % (ref 36.0–46.0)
Hemoglobin: 13.5 g/dL (ref 12.0–15.0)
Lymphocytes Relative: 15 % (ref 12–46)
Lymphs Abs: 0.8 10*3/uL (ref 0.7–4.0)
MCH: 32.7 pg (ref 26.0–34.0)
MCHC: 35.6 g/dL (ref 30.0–36.0)
MCV: 91.8 fL (ref 78.0–100.0)
Monocytes Absolute: 0.3 10*3/uL (ref 0.1–1.0)
Monocytes Relative: 5 % (ref 3–12)
Neutro Abs: 4.3 10*3/uL (ref 1.7–7.7)
Neutrophils Relative %: 79 % — ABNORMAL HIGH (ref 43–77)
Platelets: 214 10*3/uL (ref 150–400)
RBC: 4.13 MIL/uL (ref 3.87–5.11)
RDW: 12.5 % (ref 11.5–15.5)
WBC: 5.4 10*3/uL (ref 4.0–10.5)

## 2013-12-08 LAB — COMPREHENSIVE METABOLIC PANEL
ALT: 11 U/L (ref 0–35)
AST: 13 U/L (ref 0–37)
Albumin: 3.7 g/dL (ref 3.5–5.2)
Alkaline Phosphatase: 58 U/L (ref 39–117)
BUN: 13 mg/dL (ref 6–23)
CO2: 26 mEq/L (ref 19–32)
Calcium: 9.7 mg/dL (ref 8.4–10.5)
Chloride: 101 mEq/L (ref 96–112)
Creatinine, Ser: 0.82 mg/dL (ref 0.50–1.10)
GFR calc Af Amer: >90 mL/min (ref >90)
GFR calc non Af Amer: 86 mL/min — ABNORMAL LOW (ref >90)
Glucose, Bld: 99 mg/dL (ref 70–99)
Potassium: 3.6 mEq/L — ABNORMAL LOW (ref 3.7–5.3)
Sodium: 140 mEq/L (ref 137–147)
Total Bilirubin: 0.2 mg/dL — ABNORMAL LOW (ref 0.3–1.2)
Total Protein: 7.2 g/dL (ref 6.0–8.3)

## 2013-12-08 LAB — SEDIMENTATION RATE: Sed Rate: 9 mm/hr (ref 0–22)

## 2013-12-08 LAB — LACTATE DEHYDROGENASE: LDH: 162 U/L (ref 94–250)

## 2013-12-08 NOTE — Progress Notes (Signed)
[name] [name]'s reason for visit today is for labs as scheduled per MD orders.  Venipuncture performed with a 21 gauge butterfly needle to L Antecubital.  [name] [name] tolerated procedure well and without incident; questions were answered and patient was discharged.

## 2013-12-09 ENCOUNTER — Encounter: Payer: Self-pay | Admitting: Radiation Oncology

## 2013-12-09 NOTE — Progress Notes (Unsigned)
Patient called with complaints of fatigue.  "I had a couple of good weeks but now all I can do is go to work and come home and sleep. I can't live like this." In addition, the rash that was the herald of her disease previously has returned. She had labs drawn yesterday (nl HgB, TSH normal 2 months ago, ESR normal) and recent CT scans showed NED (with response in the chest continuing.) She has tumor markers pending for next week per her report. After a 20 minute conversation we discussed perhaps a referral to our cardio-oncologist, Dan Bensimhon and/or to  Lucia Gaskins at [facility] for a second opinion if her tumor markers come back "normal". We discussed that this may very well be her "new normal" and there amy not be any improvement, but she would like to turn over a few more stones to see if that is the case. She was pleased with this and asked that I forward this note to [facility].  She declined a follow up with me but knew she could call or be seen at any point if needed."

## 2013-12-10 LAB — BETA 2 MICROGLOBULIN, SERUM: Beta-2 Microglobulin: 1.69 mg/L (ref <=2.51)

## 2013-12-29 ENCOUNTER — Encounter (HOSPITAL_COMMUNITY): Payer: BC Managed Care – PPO | Attending: Oncology

## 2013-12-29 DIAGNOSIS — C8118 Nodular sclerosis classical Hodgkin lymphoma, lymph nodes of multiple sites: Secondary | ICD-10-CM

## 2013-12-29 DIAGNOSIS — C8190 Hodgkin lymphoma, unspecified, unspecified site: Secondary | ICD-10-CM | POA: Insufficient documentation

## 2013-12-29 LAB — CBC WITH DIFFERENTIAL/PLATELET
Basophils Absolute: 0 10*3/uL (ref 0.0–0.1)
Basophils Relative: 0 % (ref 0–1)
Eosinophils Absolute: 0.1 10*3/uL (ref 0.0–0.7)
Eosinophils Relative: 1 % (ref 0–5)
HCT: 38.2 % (ref 36.0–46.0)
Hemoglobin: 13.4 g/dL (ref 12.0–15.0)
Lymphocytes Relative: 14 % (ref 12–46)
Lymphs Abs: 0.8 10*3/uL (ref 0.7–4.0)
MCH: 32.2 pg (ref 26.0–34.0)
MCHC: 35.1 g/dL (ref 30.0–36.0)
MCV: 91.8 fL (ref 78.0–100.0)
Monocytes Absolute: 0.3 10*3/uL (ref 0.1–1.0)
Monocytes Relative: 5 % (ref 3–12)
Neutro Abs: 4.3 10*3/uL (ref 1.7–7.7)
Neutrophils Relative %: 80 % — ABNORMAL HIGH (ref 43–77)
Platelets: 213 10*3/uL (ref 150–400)
RBC: 4.16 MIL/uL (ref 3.87–5.11)
RDW: 12.5 % (ref 11.5–15.5)
WBC: 5.4 10*3/uL (ref 4.0–10.5)

## 2013-12-29 LAB — LACTATE DEHYDROGENASE: LDH: 167 U/L (ref 94–250)

## 2013-12-29 NOTE — Progress Notes (Signed)
[name] [name]'s reason for visit today is for labs as scheduled per MD orders.  Venipuncture performed with a 23 gauge butterfly needle to L Antecubital.  [name] [name] tolerated procedure well and without incident; questions were answered and patient was discharged.

## 2013-12-30 LAB — SEDIMENTATION RATE: Sed Rate: 3 mm/hr (ref 0–22)

## 2013-12-31 LAB — BETA 2 MICROGLOBULIN, SERUM: Beta-2 Microglobulin: 1.78 mg/L — ABNORMAL HIGH (ref <=2.51)

## 2014-01-05 NOTE — Progress Notes (Signed)
Patient called to inquire about whether a referral has been made to see either Dr.David Hurd at [facility] or Dr.Bensimohn(cardiologist) regarding continued unresolved fatigue.Informed her I would notify her next week once Dr.Wentworth returns from vacation regarding this inquiry.See note from 12/09/13 by Dr.Wentworth.

## 2014-01-07 ENCOUNTER — Telehealth: Payer: Self-pay | Admitting: *Deleted

## 2014-01-07 ENCOUNTER — Other Ambulatory Visit: Payer: Self-pay

## 2014-01-07 DIAGNOSIS — C8190 Hodgkin lymphoma, unspecified, unspecified site: Secondary | ICD-10-CM

## 2014-01-07 NOTE — Telephone Encounter (Signed)
Called patient to inform of appt. With Dr. Haroldine Laws on 01-27-14- arrival time - 11:30 am, lvm for a return call

## 2014-01-16 ENCOUNTER — Encounter (HOSPITAL_COMMUNITY): Payer: Self-pay | Admitting: Oncology

## 2014-01-19 ENCOUNTER — Encounter (HOSPITAL_COMMUNITY): Payer: BC Managed Care – PPO

## 2014-01-27 ENCOUNTER — Encounter (HOSPITAL_COMMUNITY): Payer: BC Managed Care – PPO

## 2014-01-28 ENCOUNTER — Ambulatory Visit (HOSPITAL_COMMUNITY)
Admission: RE | Admit: 2014-01-28 | Discharge: 2014-01-28 | Disposition: A | Discharge status: Home / Self Care | Payer: BC Managed Care – PPO | Source: Ambulatory Visit | Attending: Internal Medicine | Admitting: Internal Medicine

## 2014-01-28 ENCOUNTER — Encounter (HOSPITAL_COMMUNITY): Payer: Self-pay

## 2014-01-28 VITALS — BP 126/78 | HR 83 | Wt 204.5 lb

## 2014-01-28 DIAGNOSIS — F32A Depression, unspecified: Secondary | ICD-10-CM

## 2014-01-28 DIAGNOSIS — R0609 Other forms of dyspnea: Secondary | ICD-10-CM | POA: Diagnosis present

## 2014-01-28 DIAGNOSIS — F3289 Other specified depressive episodes: Secondary | ICD-10-CM

## 2014-01-28 DIAGNOSIS — F329 Major depressive disorder, single episode, unspecified: Secondary | ICD-10-CM | POA: Diagnosis present

## 2014-01-28 DIAGNOSIS — R600 Localized edema: Secondary | ICD-10-CM

## 2014-01-28 DIAGNOSIS — L259 Unspecified contact dermatitis, unspecified cause: Secondary | ICD-10-CM | POA: Diagnosis not present

## 2014-01-28 DIAGNOSIS — Z882 Allergy status to sulfonamides status: Secondary | ICD-10-CM | POA: Insufficient documentation

## 2014-01-28 DIAGNOSIS — G4730 Sleep apnea, unspecified: Secondary | ICD-10-CM | POA: Insufficient documentation

## 2014-01-28 DIAGNOSIS — I359 Nonrheumatic aortic valve disorder, unspecified: Secondary | ICD-10-CM

## 2014-01-28 DIAGNOSIS — C8190 Hodgkin lymphoma, unspecified, unspecified site: Secondary | ICD-10-CM

## 2014-01-28 DIAGNOSIS — I10 Essential (primary) hypertension: Secondary | ICD-10-CM | POA: Insufficient documentation

## 2014-01-28 DIAGNOSIS — Z888 Allergy status to other drugs, medicaments and biological substances status: Secondary | ICD-10-CM | POA: Diagnosis not present

## 2014-01-28 DIAGNOSIS — C8192 Hodgkin lymphoma, unspecified, intrathoracic lymph nodes: Secondary | ICD-10-CM | POA: Insufficient documentation

## 2014-01-28 DIAGNOSIS — R51 Headache: Secondary | ICD-10-CM | POA: Diagnosis not present

## 2014-01-28 DIAGNOSIS — R0989 Other specified symptoms and signs involving the circulatory and respiratory systems: Secondary | ICD-10-CM | POA: Insufficient documentation

## 2014-01-28 DIAGNOSIS — D649 Anemia, unspecified: Secondary | ICD-10-CM | POA: Diagnosis not present

## 2014-01-28 DIAGNOSIS — I509 Heart failure, unspecified: Secondary | ICD-10-CM | POA: Diagnosis not present

## 2014-01-28 DIAGNOSIS — G2581 Restless legs syndrome: Secondary | ICD-10-CM | POA: Diagnosis not present

## 2014-01-28 DIAGNOSIS — C50912 Malignant neoplasm of unspecified site of left female breast: Secondary | ICD-10-CM

## 2014-01-28 DIAGNOSIS — R609 Edema, unspecified: Secondary | ICD-10-CM

## 2014-01-28 NOTE — Progress Notes (Signed)
  Echocardiogram 2D Echocardiogram has been performed.  Mauricio Po 01/28/2014, 10:51 AM

## 2014-01-28 NOTE — Patient Instructions (Signed)
Heart function looks great.  Follow up as needed  Follow up with PCP doctor about what you are going through.

## 2014-01-28 NOTE — Progress Notes (Signed)
Primary Care: Dr. 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 Reports she was diagnosed with heart failure.   Has been treated with cycles of ABVD (Adriamycin, Bleomycin, Vinblastine, Dacarbazine) but with incomplete response both after cycles 3 and cycles 5 and was switched to dose adjusted BEACOPP (Bleomycin, Etoposide, Doxorubcin, Cyclophoshamide, Vincristine, Procarbazine, Predinsone) starting on 08/28/2012. Finished treatment June 2014. Had radiation to her chest.   Reports that she has had SOB over the past 4 months and has noticed that she is getting some LE edema. Reports heaviness in chest that makes her SOB. + fatigue. + hacky cough.   ECHOs (08/2012): EF 65-70%, grade I DD (01/28/2014): EF 60-65%, grade I DD, normal filling pressures, GS -19.9, lateral s' 11.8  Review of Systems: [y] = yes, [ ]  = no   General: Weight gain [ ] ; Weight loss [ ] ; Anorexia [ ] ; Fatigue [Y ]; Fever [ ] ; Chills [ ] ; Weakness [ ]   Cardiac: Chest pain/pressure [ Y]; Resting SOB [ ] ; Exertional SOB [Y ]; Orthopnea [ ] ; Pedal Edema [Y ]; Palpitations [ ] ; Syncope [ ] ; Presyncope [ ] ; Paroxysmal nocturnal dyspnea[ ]   Pulmonary: Cough [ ] ; Wheezing[ ] ; Hemoptysis[ ] ; Sputum [ ] ; Snoring [ ]   GI: Vomiting[ ] ; Dysphagia[ ] ; Melena[ ] ; Hematochezia [ ] ; Heartburn[ ] ; Abdominal pain [ ] ; Constipation [ ] ; Diarrhea [ ] ; BRBPR [ ]   GU: Hematuria[ ] ; Dysuria [ ] ; Nocturia[ ]   Vascular: Pain in legs with walking [ ] ; Pain in feet with lying flat [ ] ; Non-healing sores [ ] ; Stroke [ ] ; TIA [ ] ; Slurred speech [ ] ;  Neuro: Headaches[ ] ; Vertigo[ ] ; Seizures[ ] ; Paresthesias[ ] ;Blurred vision [ ] ; Diplopia [ ] ; Vision changes [ ]   Ortho/Skin: Arthritis [  ]; Joint pain [ Y]; Muscle pain [ ] ; Joint swelling [ ] ; Back Pain [ ] ; Rash [ ]   Psych: Depression[Y ]; Anxiety[ ]   Heme: Bleeding problems [ ] ; Clotting disorders [ ] ; Anemia [ ]   Endocrine: Diabetes [ ] ; Thyroid dysfunction[ ]    Past Medical History  Diagnosis Date  . Fibromyalgia   . Eczema   . Depression     parents died close together "was hard"  . Restless leg   . Heart murmur     Told 'nothing to worry about"  No 2D Echo  . Sleep apnea     Sleep Study- 1996- "little apnea- no tx"  . Headache(784.0)     Birth control pills help  . Hemorrhoid   . Anemia     As a child and while pregnant  . 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 Hypertension     recently dx with hodgkins  . Dyspnea     all the time .".due to the mass"  . CHF (congestive heart failure)     pt stated it was due to the CA but after chemo began it went away  . History of radiation therapy 10/28/12-11/20/12    chest & neck,34Gy/63fx  . S/P cholecystectomy 09/14/2013    08/29/2013 by Dr. Arnoldo Morale.  Negative pathology    Current Outpatient Prescriptions  Medication Sig Dispense Refill  . Biotin 1 MG CAPS Take 1 tablet  by mouth daily.      . Calcium Carbonate (CALCIUM 600 PO) Take 1 capsule by mouth daily.      . cholecalciferol (VITAMIN D) 1000 UNITS tablet Take 1,000 Units by mouth daily.      . Cyanocobalamin (VITAMIN B-12 PO) Take 1 capsule by mouth daily.      Marland Kitchen gabapentin (NEURONTIN) 600 MG tablet Take 600 mg by mouth 2 (two) times daily. Only taking 300mg  per day      . Ibuprofen-Diphenhydramine Cit (ADVIL PM PO) Take 2 tablets by mouth as needed.      [name] [name] 1/20 1-20 MG-MCG tablet Take 1 tablet by mouth daily.      Marland Kitchen KRILL OIL 1000 MG CAPS Take 1 capsule by mouth daily.       Marland Kitchen lisinopril-hydrochlorothiazide (PRINZIDE,ZESTORETIC) 20-12.5 MG per tablet Take 1 tablet by mouth daily.      . methocarbamol (ROBAXIN) 500 MG tablet Take 1 tablet (500 mg total) by mouth 2 (two)  times daily. Refill given on 08/30/12 per Dr. Tressie Stalker.  60 tablet  0  . oxyCODONE-acetaminophen (PERCOCET) 7.5-325 MG per tablet Take 1-2 tablets by mouth every 4 (four) hours as needed.  50 tablet  0  . phentermine 37.5 MG capsule Take 37.5 mg by mouth every morning.      . potassium chloride SA (K-DUR,KLOR-CON) 20 MEQ tablet Take 20 mEq by mouth 2 (two) times daily.      . prednisoLONE acetate (PRED FORTE) 1 % ophthalmic suspension Place 1 drop into both eyes daily as needed.       . temazepam (RESTORIL) 15 MG capsule Take 15 mg by mouth at bedtime as needed for sleep.      Marland Kitchen tiZANidine (ZANAFLEX) 4 MG tablet Take 4 mg by mouth at bedtime.       No current facility-administered medications for this encounter.   Facility-Administered Medications Ordered in Other Encounters  Medication Dose Route Frequency Provider Last Rate Last Dose  . sodium chloride 0.9 % injection 10 mL  10 mL Intravenous PRN Pieter Partridge, MD   10 mL at 10/03/12 1643    Allergies  Allergen Reactions  . Other Other (See Comments)    Patient can not have straight oxygen due to reaction with chemo medications.   . Sulfur Anaphylaxis      History   Social History  . Marital Status: Married    Spouse Name: N/A    Number of Children: N/A  . Years of Education: N/A   Occupational History  . Not on file.   Social History Main Topics  . Smoking status: Never Smoker   . Smokeless tobacco: Never Used  . Alcohol Use: 0.0 oz/week    0 drink(s) per week     Comment: occasionaly 2 x month; rarely  . Drug Use: No  . Sexual Activity: Yes    Birth Control/ Protection: Pill   Other Topics Concern  . 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 Lives in [place] with husband.       Family History  Problem Relation Age of Onset  . Cancer Paternal Uncle   . Cancer Maternal Grandmother     lung ca mets to throat    Filed Vitals:   01/28/14 1102  BP: 126/78   Pulse: 83  Weight: 204 lb 8 oz (92.761 kg)  SpO2: 99%    PHYSICAL EXAM: General:  Well appearing. No respiratory  difficulty HEENT: normal Neck: supple. no JVD. Carotids 2+ bilat; no bruits. No lymphadenopathy or thryomegaly appreciated. Cor: PMI nondisplaced. Regular rate & rhythm. No rubs, gallops or murmurs. Lungs: clear Abdomen: soft, nontender, nondistended. No hepatosplenomegaly. No bruits or masses. Good bowel sounds. Extremities: no cyanosis, clubbing, rash, edema Neuro: alert & oriented x 3, cranial nerves grossly intact. moves all 4 extremities w/o difficulty. Flat affect  ASSESSMENT & PLAN:  1) Hodgkin's Lymphoma: - Patient was referred to assess her ECHOs for concern for cardiotoxicity related to chemotherapy. Dr. Haroldine Laws reviewed previous ECHOs and EF normal and lateral s' stable. She does have a little bit of diastolic dysfunction that could be leading to the minimal LE edema, however she does not have much volume on board. Encouraged her to try to follow salt diet.  2) Depression - Concerned that a lot of the fatigue is related to depression. She is not being treated currently and encouraged her to talk to PCP about depression and being treated or talk to therapist.  3) Snoring - Would not be bad to repeat sleep study if she is interested, but will let he make that decision whether she would like to mover forward with this test.  F/U PRN Junie Bame B NP-C 11:38 AM  Patient seen and examined with Junie Bame, NP. We discussed all aspects of the encounter. I agree with the assessment and plan as stated above.   I have reviewed multiple echos personally. All show normal EF with grade I diastolic dysfunction (normal for age) and normal LV filling parameters. I reassured her at length that her heart is normal on all echos and I did not see any previous or current evidence of HF. She does have some occasional LE edema which I suspect is related to venous insufficiency. I  think major issue is a reactive depression and her uncertainty related to to the possibility that her cancer might come back. She was quite tearful about this at multiple points throughout her visit. I have recommended that she consider an SSRI +/- counseling. She will d/w Dr. [place] Crane. BP currently ok. Can f/u PRN.   Daniel Bensimhon,MD 3:52 PM

## 2014-02-03 NOTE — Progress Notes (Signed)
[name] Cahill, MD  [place] [address]  Hodgkin's lymphoma - Plan: CBC with Differential, Lactate dehydrogenase, Comprehensive metabolic panel, Beta 2 microglobuline, serum, CBC with Differential, Lactate dehydrogenase, Comprehensive metabolic panel, Beta 2 microglobuline, serum  Other fatigue - Plan: CBC with Differential, Lactate dehydrogenase, Comprehensive metabolic panel, Vitamin O03, Folate, Iron and TIBC, Ferritin, Vitamin D 25 hydroxy, Copper, serum, TSH, Beta 2 microglobuline, serum, Miscellaneous test, CBC with Differential, Lactate dehydrogenase, Comprehensive metabolic panel, Vitamin T59, Folate, Iron and TIBC, Ferritin, Vitamin D 25 hydroxy, Copper, serum, TSH, Beta 2 microglobuline, serum, Miscellaneous test, CANCELED: CKMB  CURRENT THERAPY: Surveillance per NCCN guidelines  INTERVAL HISTORY: [name] [name] 43 y.o. 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 She was then switched to dose adjusted BEACOPP x2 cycles. Her PET scan after the second cycle showed complete remission. Because of probable inflammatory changes in the lungs and a prior Citrobacter blood stream/pulmonary infection felt to be the cause of the PET scan findings in the lungs she has had Levaquin which she is now finished. Now S/P involved field radiation by Dr. Thea Silversmith ([place]) finishing on 11/20/2012.  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 She was then switched  to dose adjusted BEACOPP x2 cycles. Her PET scan after the second cycle showed complete remission. Because of probable inflammatory changes in the lungs and a prior Citrobacter blood stream/pulmonary infection felt to be the cause of the PET scan findings in the lungs she has had Levaquin which she is now finished. Now S/P involved field radiation by Dr. Thea Silversmith ([place]) finishing on 11/20/2012.     Hodgkin's lymphoma   09/14/2010 Imaging Chest xray demonstrating- Possible mediastinal mass as discussed above.  Recommend chest CT scan for further evaluation.  Further work-up by ordering physician never performed   02/26/2012 Imaging CT of chest- Large anterior mediastinal mass is identified and is worrisome for tumor.  This is encasing the great vessels and S causing significant narrowing of the left subclavian vein.   03/05/2012 Bone Marrow Biopsy Negative   03/05/2012 Initial Diagnosis Anterior mediastinal mass biopsy- Classical Hodgkin's lymphoma   03/13/2012 Imaging PET scan- hypermetabolic anterior mediastinal mass, extension of dominant mass in the low left neck, isolated small right lower jugular hypermetabolic node, no subdiaphragmatic disease.   03/27/2012 - 08/07/2012 Chemotherapy ABVD x 5 cycles but with incomplete response evaluated after cycle 3 and after cycle #5.   06/20/2012 Imaging PET scan- interval response to therapy as evidenced by decrease in size and metabolic activity involving the anterior mediastinal mass   08/16/2012 Imaging PET scan- The anterior mediastinal soft tissue mass shows no interval change in size and there is a persistent small peripheral focus of hypermetabolism within this lesion on today's exam.   08/28/2012 - 09/25/2012 Chemotherapy BEACOPP x 2 cycles   10/08/2012 Imaging PET scan- Today's study demonstrates a positive response to therapy with decrease in size and resolution of hypermetabolism within the anterior  mediastinal nodal mass.  The mass currently measures  6.2 x 2.5 cm.   10/09/2012 Remission    10/23/2012 - 11/20/2012 Radiation Therapy Dr. Pablo Ledger   12/02/2012 Adverse Reaction Radiation recall- negative work-up.  Treated with Prednisone.   05/26/2013 Imaging PET scan- No substantial change in size of the anterior mediastinal mass although there is no hypermetabolism within this lesion on the current study.  Interval decrease in spleen size.   11/12/2013 Imaging CT CAP- decrease size in medistinal mass.  NED.    Since our last encounter, [name] [name] has been seen by Dr. Haroldine Laws on 01/28/2014 following a repeat 2D echo.  Dr. Clayborne Dana impression and plan follow below: 1) Hodgkin's Lymphoma:  - Patient was referred to assess her ECHOs for concern for cardiotoxicity related to chemotherapy. Dr. Haroldine Laws reviewed previous ECHOs and EF normal and lateral s' stable. She does have a little bit of diastolic dysfunction that could be leading to the minimal LE edema, however she does not have much volume on board. Encouraged her to try to follow salt diet.  2) Depression  - Concerned that a lot of the fatigue is related to depression. She is not being treated currently and encouraged her to talk to PCP about depression and being treated or talk to therapist.  3) Snoring  - Would not be bad to repeat sleep study if she is interested, but will let he make that decision whether she would like to mover forward with this test.  I reviewed on his chart in detail. It is noted that over the past 3 months she reports significant progression of fatigue. She was seen by cardiology as mentioned above with a negative workup from a cardiac standpoint. Today during discussion, she became emotional regarding the amount of fatigue she has. She reports as though she is going backwards it forwards it she notes that she feels like she did about a year ago when she was diagnosed with presumed radiation recall and now is treated with steroids.  I question her about depression. She  reports that she is emotional but this is secondary to her fatigue. I guess the question is whether her fatigue is from depression, or is her depression from fatigue.  It's important to rule out recurrence of Hodgkin's disease in the interim. As result, we will do our typical laboratory work for Hodgkin's disease, but I'll also add additional laboratory work to rule out other etiologies. Additionally, we'll move PET scan and perform this in the near future rather than waiting until December as previously planned.  Hematologically, she otherwise denies any complaints of ROS questioning is negative. She denies any B. symptomatology. No new lungs or bumps noted. She notes that she's tried Lexapro in the past and this caused her increased fatigue. This was used for headaches. She's also tried pristiq with little effectiveness when her father passed away in 2010/09/01. We can try Prozac or Zoloft in the future.  Past Medical History  Diagnosis Date  . Fibromyalgia   . Eczema   . Depression     parents died close together "was hard"  . Restless leg   . Sleep apnea     Sleep Study03-22-96- "little apnea- no tx"  . Headache(784.0)     Birth control pills help  . Hemorrhoid   . Anemia     As a child and while pregnant  . 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 Hypertension  recently dx with hodgkins  . Dyspnea     all the time .".due to the mass"  . History of radiation therapy 10/28/12-11/20/12    chest & neck,34Gy/7f  . S/P cholecystectomy 09/14/2013    08/29/2013 by Dr. JArnoldo Morale  Negative pathology    has Fibromyalgia; Eczema; Hypertension; Depression; Restless leg; Hodgkin's lymphoma; Citrobacter infection; S/P cholecystectomy; and Leg edema on her problem list.     is allergic to other and sulfur.  Ms. TCamberosdoes not currently have medications on file.  Past Surgical History  Procedure Laterality Date  . Wisdom tooth extraction    . Dilation and curettage  of uterus  1991  . Mediastinal mass biopsy  03/05/12  . Bone marrow biopsy  03/20/2012  . Portacath placement  03/26/2012    Procedure: INSERTION PORT-A-CATH;  Surgeon: SMelrose Nakayama MD;  [facility]  Service: Thoracic;  Laterality: N/A;  . Port-a-cath removal Left 10/16/2012    Procedure: REMOVAL PORT-A-CATH;  Surgeon: SMelrose Nakayama MD;  [facility]  Service: Thoracic;  Laterality: Left;  . Cholecystectomy N/A 08/29/2013    Procedure: LAPAROSCOPIC CHOLECYSTECTOMY;  Surgeon: MJamesetta So MD;  Location: [facility];  Service: General;  Laterality: N/A;    Denies any headaches, dizziness, double vision, fevers, chills, night sweats, nausea, vomiting, diarrhea, constipation, chest pain, heart palpitations, shortness of breath, blood in stool, black tarry stool, urinary pain, urinary burning, urinary frequency, hematuria.   PHYSICAL EXAMINATION  ECOG PERFORMANCE STATUS: 1 - Symptomatic but completely ambulatory  Filed Vitals:   02/05/14 1000  BP: 142/93  Pulse: 82  Temp: 97.7 F (36.5 C)  Resp: 16    GENERAL:alert, no distress, well nourished, well developed, comfortable and cooperative SKIN: skin color, texture, turgor are normal, no rashes or significant lesions HEAD: Normocephalic, No masses, lesions, tenderness or abnormalities EYES: normal, PERRLA, EOMI, Conjunctiva are pink and non-injected EARS: External ears normal OROPHARYNX:mucous membranes are moist  NECK: supple, no adenopathy, thyroid normal size, non-tender, without nodularity, no stridor, non-tender, trachea midline LYMPH:  no palpable lymphadenopathy, no hepatosplenomegaly BREAST:not examined LUNGS: clear to auscultation and percussion HEART: regular rate & rhythm, no murmurs, no gallops, S1 normal and S2 normal ABDOMEN:abdomen soft, non-tender, normal bowel sounds and no masses or organomegaly BACK: Back symmetric, no curvature., No CVA tenderness EXTREMITIES:less then 2 second capillary refill,  no joint deformities, effusion, or inflammation, no edema, no skin discoloration, no clubbing, no cyanosis  NEURO: alert & oriented x 3 with fluent speech, no focal motor/sensory deficits, gait normal   LABORATORY DATA:  CBC    Component Value Date/Time   WBC 5.4 12/29/2013 1600   WBC 2.5* 11/13/2012 1552   RBC 4.16 12/29/2013 1600   RBC 3.04* 11/13/2012 1552   HGB 13.4 12/29/2013 1600   HGB 10.5* 11/13/2012 1552   HCT 38.2 12/29/2013 1600   HCT 29.6* 11/13/2012 1552   PLT 213 12/29/2013 1600   PLT 146 11/13/2012 1552   MCV 91.8 12/29/2013 1600   MCV 97.6 11/13/2012 1552   MCH 32.2 12/29/2013 1600   MCH 34.7* 11/13/2012 1552   MCHC 35.1 12/29/2013 1600   MCHC 35.6 11/13/2012 1552   RDW 12.5 12/29/2013 1600   RDW 16.3* 11/13/2012 1552   LYMPHSABS 0.8 12/29/2013 1600   LYMPHSABS 0.1* 11/13/2012 1552   MONOABS 0.3 12/29/2013 1600   MONOABS 0.4 11/13/2012 1552   EOSABS 0.1 12/29/2013 1600   EOSABS 0.1 11/13/2012 1552   BASOSABS 0.0 12/29/2013 1600  BASOSABS 0.0 11/13/2012 1552      Chemistry      Component Value Date/Time   NA 140 12/08/2013 1600   NA 136 11/13/2012 1552   K 3.6* 12/08/2013 1600   K 3.6 11/13/2012 1552   CL 101 12/08/2013 1600   CL 101 11/13/2012 1552   CO2 26 12/08/2013 1600   CO2 27 11/13/2012 1552   BUN 13 12/08/2013 1600   BUN 20.4 11/13/2012 1552   CREATININE 0.82 12/08/2013 1600   CREATININE 1.0 11/13/2012 1552      Component Value Date/Time   CALCIUM 9.7 12/08/2013 1600   CALCIUM 9.0 11/13/2012 1552   ALKPHOS 58 12/08/2013 1600   ALKPHOS 62 11/13/2012 1552   AST 13 12/08/2013 1600   AST 27 11/13/2012 1552   ALT 11 12/08/2013 1600   ALT 22 11/13/2012 1552   BILITOT 0.2* 12/08/2013 1600   BILITOT 0.88 11/13/2012 1552     Results for [name], [name] (MRN [id number]) as of 02/03/2014 18:01  Ref. Range 12/29/2013 16:00  LDH Latest Range: 94-250 U/L 167   Results for [name], [name] (MRN [id number]) as of 02/03/2014 18:01  Ref. Range 12/29/2013 16:00  Beta-2 Microglobulin Latest Range: <=2.51 mg/L  1.78 (H)      ASSESSMENT:  1. 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 She was then switched to dose adjusted BEACOPP x2 cycles. Her PET scan after the second cycle showed complete remission. Because of probable inflammatory changes in the lungs and a prior Citrobacter blood stream/pulmonary infection felt to be the cause of the PET scan findings in the lungs she has had Levaquin which she is now finished. Now S/P involved field radiation by Dr. Thea Silversmith ([place]) finishing on 11/20/2012.  2. Suspected radiation recall, treated and resolved.  3. S/P cholecystectomy by Dr. Arnoldo Morale on 08/29/2013.  4. Fibromyalgia 5. Progressive fatigue  Patient Active Problem List   Diagnosis Date Noted  . Leg edema 01/28/2014  . S/P cholecystectomy 09/14/2013  . Citrobacter infection 09/30/2012  . Hodgkin's lymphoma 03/20/2012  . Fibromyalgia 02/29/2012  . Eczema   . Hypertension   . Depression   . Restless leg      PLAN:  1. I personally reviewed and went over laboratory results with the patient. The results are noted within this dictation.  2. I personally reviewed and went over radiographic studies with the patient. The results are noted within this dictation.  3. MRI breast w wo contrast for breast cancer surveillance given her mediastinal radiation and per NCCN guidelines  4. PET scan in December for surveillance and evaluate mediastinal mass for hypermetabolic activity will be moved up to be completed in the next few weeks.  5. Patient education regarding her recurrence risk and treatment associated side effects.  6. Labs today: CBC diff, LDH, CMET, Anemia panel, Vit D, Copper level, TSH, B2M, CKMB 7. Return in 2-3 weeks to review data.  If negative for  recurrence, then we need to consider depression treatment.  May need to consider a PredPak as well.    THERAPY PLAN:  Given her progressive weakness and fatigue, we will rule out recurrence of Hodgkin's lymphoma with PET scan in typical laboratory work. Additionally I will evaluate for other etiologies of fatigue with additional laboratory  work. If work-up is negative, then we need to discuss depression treatment and consider Predpak for symptom management.  NCCN guidelines for surveillance of Hodgkin's Lymphoma (Stage I-IV) for first 5 years are as follows:  A. H+P every 3-6 months for years 1-2, then every 6-12 months until year 3, then annually.   B. Annual influenza vaccine.  C. Laboratory studies: CBC diff, ESR (if elevated at time of initial diagnosis), CMET.  TSH at least annually if RT to neck.  D. Chest x-ray of Ct every 6-12 months during first 2 years, then chest x-ray is optional.  E. Abdominal/pelvic CT every 6-12 months for first 2 years  F. Counseling: Reproduction, health habits, psychosocial, cardiovascular, breast self-exam, skin cancer risk, end-of treatment discussion.  G. Surveillance PET scan should not be done routinely due to risk for false positive.  Management decisions should not be based on PET scan alone; clinical or pathologic correlation is needed.   NCCN Guidelines for monitoring for late effects after 5 years are:  A. H+P annually  B. Annual blood pressure, aggressive management of cardiovascular risk factors  C. Pneumococcal, meningococcal, and H. Flue revaccination after 5-7 years, if patient treated with splenic RT of previous splenectomy  D. Annual influenza vaccine.  E. Cardiovascular symptoms may emerge at a young age   107. Consider stress test/echocardiogram at 10 year intervals after treatment is completed, especially if chest cardiac irradiation.   2. Consider carotid US at 10 year intervals if neck irradiation.  F. Lab studies: CBC diff, CMET annually  and TSH at least annually if RT to neck and annual lipids.  G. Consider chest imaging for patients at increased risk for lung cancer.  H. Annual breast screening:  Initiate 8-10 years post-therapy, or at age 12, whichever comes first, if chest or axillary radiation.  The NCCN Hodgkin Lymphoma Guideline s Panel recommends breast MRI in addition to mammography for women who received irradiation to the chest between ages 37 and 30 years, which is consistent with the American Cancer Society Guidelines.  I. Counseling: reproduction, health habits, psychosocial, cardiovascular, breast self-exam, and skin cancer risk.  J. Treatment summary and consideration of transfer to PCP.  K. Consider a referral to a survivorship clinic.   All questions were answered. The patient knows to call the clinic with any problems, questions or concerns. We can certainly see the patient much sooner if necessary.  Patient and plan discussed with Dr. Farrel Gobble and he is in agreement with the aforementioned.   [name] [name] 02/05/2014

## 2014-02-05 ENCOUNTER — Encounter (HOSPITAL_BASED_OUTPATIENT_CLINIC_OR_DEPARTMENT_OTHER): Payer: BC Managed Care – PPO

## 2014-02-05 ENCOUNTER — Encounter (HOSPITAL_COMMUNITY): Payer: Self-pay | Admitting: Oncology

## 2014-02-05 ENCOUNTER — Ambulatory Visit (HOSPITAL_COMMUNITY): Payer: BC Managed Care – PPO | Admitting: Oncology

## 2014-02-05 ENCOUNTER — Encounter (HOSPITAL_COMMUNITY): Payer: BC Managed Care – PPO | Attending: Oncology | Admitting: Oncology

## 2014-02-05 VITALS — BP 142/93 | HR 82 | Temp 97.7°F | Resp 16 | Wt 204.3 lb

## 2014-02-05 DIAGNOSIS — R5383 Other fatigue: Secondary | ICD-10-CM

## 2014-02-05 DIAGNOSIS — C8190 Hodgkin lymphoma, unspecified, unspecified site: Secondary | ICD-10-CM | POA: Insufficient documentation

## 2014-02-05 DIAGNOSIS — R5381 Other malaise: Secondary | ICD-10-CM | POA: Insufficient documentation

## 2014-02-05 LAB — LACTATE DEHYDROGENASE: LDH: 155 U/L (ref 94–250)

## 2014-02-05 LAB — COMPREHENSIVE METABOLIC PANEL
ALT: 12 U/L (ref 0–35)
AST: 17 U/L (ref 0–37)
Albumin: 3.9 g/dL (ref 3.5–5.2)
Alkaline Phosphatase: 59 U/L (ref 39–117)
Anion gap: 12 (ref 5–15)
BUN: 15 mg/dL (ref 6–23)
CO2: 25 mEq/L (ref 19–32)
Calcium: 9.7 mg/dL (ref 8.4–10.5)
Chloride: 102 mEq/L (ref 96–112)
Creatinine, Ser: 0.67 mg/dL (ref 0.50–1.10)
GFR calc Af Amer: >90 mL/min (ref >90)
GFR calc non Af Amer: >90 mL/min (ref >90)
Glucose, Bld: 110 mg/dL — ABNORMAL HIGH (ref 70–99)
Potassium: 3.6 mEq/L — ABNORMAL LOW (ref 3.7–5.3)
Sodium: 139 mEq/L (ref 137–147)
Total Bilirubin: 0.5 mg/dL (ref 0.3–1.2)
Total Protein: 7.1 g/dL (ref 6.0–8.3)

## 2014-02-05 LAB — CBC WITH DIFFERENTIAL/PLATELET
Basophils Absolute: 0 K/uL (ref 0.0–0.1)
Basophils Relative: 0 % (ref 0–1)
Eosinophils Absolute: 0.1 K/uL (ref 0.0–0.7)
Eosinophils Relative: 2 % (ref 0–5)
HCT: 37.4 % (ref 36.0–46.0)
Hemoglobin: 13.2 g/dL (ref 12.0–15.0)
Lymphocytes Relative: 14 % (ref 12–46)
Lymphs Abs: 0.6 K/uL — ABNORMAL LOW (ref 0.7–4.0)
MCH: 32.3 pg (ref 26.0–34.0)
MCHC: 35.3 g/dL (ref 30.0–36.0)
MCV: 91.4 fL (ref 78.0–100.0)
Monocytes Absolute: 0.2 K/uL (ref 0.1–1.0)
Monocytes Relative: 6 % (ref 3–12)
Neutro Abs: 3.1 K/uL (ref 1.7–7.7)
Neutrophils Relative %: 78 % — ABNORMAL HIGH (ref 43–77)
Platelets: 182 K/uL (ref 150–400)
RBC: 4.09 MIL/uL (ref 3.87–5.11)
RDW: 12.1 % (ref 11.5–15.5)
WBC: 4 K/uL (ref 4.0–10.5)

## 2014-02-05 LAB — IRON AND TIBC
Iron: 163 ug/dL — ABNORMAL HIGH (ref 42–135)
Saturation Ratios: 46 % (ref 20–55)
TIBC: 353 ug/dL (ref 250–470)
UIBC: 190 ug/dL (ref 125–400)

## 2014-02-05 LAB — FERRITIN: Ferritin: 153 ng/mL (ref 10–291)

## 2014-02-05 LAB — FOLATE: Folate: 13 ng/mL

## 2014-02-05 LAB — CK TOTAL AND CKMB (NOT AT ARMC)
CK, MB: 1.7 ng/mL (ref 0.3–4.0)
Relative Index: INVALID (ref 0.0–2.5)
Total CK: 89 U/L (ref 7–177)

## 2014-02-05 LAB — VITAMIN B12: Vitamin B-12: 1986 pg/mL — ABNORMAL HIGH (ref 211–911)

## 2014-02-05 LAB — TSH: TSH: 3.2 u[IU]/mL (ref 0.350–4.500)

## 2014-02-05 NOTE — Patient Instructions (Signed)
[facility] Discharge Instructions  RECOMMENDATIONS MADE BY THE CONSULTANT AND ANY TEST RESULTS WILL BE SENT TO YOUR REFERRING PHYSICIAN.  EXAM FINDINGS BY THE PHYSICIAN TODAY AND SIGNS OR SYMPTOMS TO REPORT TO CLINIC OR PRIMARY PHYSICIAN: You saw [name] [name] today  Follow up with doctor in 3 weeks.  You will have lab work today.  Thank you for choosing [facility] to provide your oncology and hematology care.  To afford each patient quality time with our providers, please arrive at least 15 minutes before your scheduled appointment time.  With your help, our goal is to use those 15 minutes to complete the necessary work-up to ensure our physicians have the information they need to help with your evaluation and healthcare recommendations.    Effective January 1st, 2014, we ask that you re-schedule your appointment with our physicians should you arrive 10 or more minutes late for your appointment.  We strive to give you quality time with our providers, and arriving late affects you and other patients whose appointments are after yours.    Again, thank you for choosing [facility].  Our hope is that these requests will decrease the amount of time that you wait before being seen by our physicians.       _____________________________________________________________  Should you have questions after your visit to [facility], please contact our office at [phone] between the hours of 8:30 a.m. and 5:00 p.m.  Voicemails left after 4:30 p.m. will not be returned until the following business day.  For prescription refill requests, have your pharmacy contact our office with your prescription refill request.

## 2014-02-05 NOTE — Progress Notes (Signed)
Labs dawn for vd25h,cop,iron/tibc,ferr,fol,cmp,b12,cbcd,ldh,tsh,b45m,ckmb

## 2014-02-06 LAB — VITAMIN D 25 HYDROXY (VIT D DEFICIENCY, FRACTURES): Vit D, 25-Hydroxy: 80 ng/mL (ref 30–89)

## 2014-02-07 LAB — COPPER, SERUM: Copper: 176 ug/dL — ABNORMAL HIGH (ref 70–175)

## 2014-02-09 ENCOUNTER — Encounter (HOSPITAL_COMMUNITY)
Admission: RE | Admit: 2014-02-09 | Discharge: 2014-02-09 | Disposition: A | Discharge status: Home / Self Care | Payer: BC Managed Care – PPO | Source: Ambulatory Visit | Attending: Oncology | Admitting: Oncology

## 2014-02-09 ENCOUNTER — Encounter (HOSPITAL_COMMUNITY): Payer: Self-pay

## 2014-02-09 DIAGNOSIS — C8190 Hodgkin lymphoma, unspecified, unspecified site: Secondary | ICD-10-CM | POA: Diagnosis present

## 2014-02-09 LAB — GLUCOSE, CAPILLARY: Glucose-Capillary: 89 mg/dL (ref 70–99)

## 2014-02-09 LAB — BETA 2 MICROGLOBULIN, SERUM: Beta-2 Microglobulin: 1.72 mg/L (ref <=2.51)

## 2014-02-09 MED ORDER — FLUDEOXYGLUCOSE F - 18 (FDG) INJECTION: 9.9700 | Freq: Once | INTRAVENOUS | Status: AC | PRN

## 2014-02-15 NOTE — Progress Notes (Signed)
[name], MD  [place] [address]  Hodgkin's lymphoma - Plan: CBC with Differential, Comprehensive metabolic panel, Sedimentation rate, Lactate dehydrogenase, Beta 2 microglobuline, serum, CT Abdomen Pelvis W Contrast, CT Chest W Contrast  Fatigue due to depression  Depression - Plan: sertraline (ZOLOFT) 50 MG tablet  CURRENT THERAPY: Surveillance per NCCN guidelines  INTERVAL HISTORY: [name] [name] 43 y.o. female returns for  regular  visit for followup of progressive fatigue without evidence of Hodgkin's Lymphoma recurrence.   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 She was then switched to dose adjusted BEACOPP x2 cycles. Her PET scan after the second cycle showed complete remission. Because of probable inflammatory changes in the lungs and a prior Citrobacter blood stream/pulmonary infection felt to be the cause of the PET scan findings in the lungs she has had Levaquin which she is now finished. Now S/P involved field radiation by Dr. Thea Silversmith ([place]) finishing on 11/20/2012.     Hodgkin's lymphoma   09/14/2010 Imaging Chest xray demonstrating- Possible mediastinal mass as discussed above.  Recommend chest CT scan for further evaluation.  Further work-up by ordering physician never performed   02/26/2012 Imaging CT of chest- Large anterior mediastinal mass is identified and is worrisome for tumor.  This is encasing the great vessels and S causing significant narrowing of the left subclavian vein.   03/05/2012 Bone Marrow Biopsy Negative   03/05/2012 Initial Diagnosis Anterior mediastinal mass biopsy- Classical Hodgkin's lymphoma   03/13/2012 Imaging PET scan- hypermetabolic anterior mediastinal mass,  extension of dominant mass in the low left neck, isolated small right lower jugular hypermetabolic node, no subdiaphragmatic disease.   03/27/2012 - 08/07/2012 Chemotherapy ABVD x 5 cycles but with incomplete response evaluated after cycle 3 and after cycle #5.   06/20/2012 Imaging PET scan- interval response to therapy as evidenced by decrease in size and metabolic activity involving the anterior mediastinal mass   08/16/2012 Imaging PET scan- The anterior mediastinal soft tissue mass shows no interval change in size and there is a persistent small peripheral focus of hypermetabolism within this lesion on today's exam.   08/28/2012 - 09/25/2012 Chemotherapy BEACOPP x 2 cycles   10/08/2012 Imaging PET scan- Today's study demonstrates a positive response to therapy with decrease in size and resolution of hypermetabolism within the anterior mediastinal nodal mass.  The mass currently measures 6.2 x 2.5 cm.   10/09/2012 Remission    10/23/2012 - 11/20/2012 Radiation Therapy Dr. Pablo Ledger   12/02/2012 Adverse Reaction Radiation recall- negative work-up.  Treated with Prednisone.   05/26/2013 Imaging PET scan- No substantial change in size of the anterior mediastinal mass although there is no hypermetabolism within this lesion on the current study.  Interval decrease in spleen size.   11/12/2013 Imaging CT CAP- decrease size in medistinal mass.  NED.   02/09/2014 PET scan Persistent soft tissue within the anterior mediastinum with very low level metabolic activity (Deauville Criteria 2). Findings likely reflect treated tumor.    I personally reviewed and went over radiographic studies with the patient.  The results are noted within this dictation.  PET scan on 8/31 demonstrates the following: Persistent soft tissue within the anterior mediastinum with very  low level metabolic activity (  Deauville Criteria 2). Findings likely  reflect treated tumor.  I personally reviewed and went over laboratory results with the  patient.  The results are noted within this dictation.  With this information, in conjunction to her lab results, I suspect her progressive fatigue is extraneous to her hem/onc issue and/or its treatment.  I suspect a level of depression.  I have discussed the case with her primary care provider, Dr. Nevada Crane.  He aggress with SSRI treatment.  She reports that Thursday- Saturday she felt terrible with regards to her fatigue level.  Then, on Sunday she felt great and went for a 3 hr bike ride.  Then Monday she went kayaking.  Today she feels great too.  She notes a cyclical pattern to her fatigue.    I provided her education regarding Hodgkin's Lymphoma and the treatment she has received.  Her symptomatology is extraneous to her malignancy   Hematologically, she denies any complaints and ROS questioning is negative.   Past Medical History  Diagnosis Date  . Fibromyalgia   . Eczema   . Depression     parents died close together "was hard"  . Restless leg   . Sleep apnea     Sleep Study- 1996- "little apnea- no tx"  . Headache(784.0)     Birth control pills help  . Hemorrhoid   . Anemia     As a child and while pregnant  . 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 Hypertension     recently dx with hodgkins  . Dyspnea     all the time .".due to the mass"  . History of radiation therapy 10/28/12-11/20/12    chest & neck,34Gy/52f  . S/P cholecystectomy 09/14/2013    08/29/2013 by Dr. JArnoldo Morale  Negative pathology    has Fibromyalgia; Eczema; Hypertension; Depression; Restless leg; Hodgkin's lymphoma; Citrobacter infection; S/P cholecystectomy; and Leg edema on her problem list.     is allergic to other and sulfur.  Ms. TRauberhad no medications administered during this visit.  Past Surgical History  Procedure Laterality Date  . Wisdom tooth extraction    . Dilation and curettage of uterus  1991  . Mediastinal mass biopsy  03/05/12  . Bone marrow biopsy   03/20/2012  . Portacath placement  03/26/2012    Procedure: INSERTION PORT-A-CATH;  Surgeon: SMelrose Nakayama MD;  [facility]  Service: Thoracic;  Laterality: N/A;  . Port-a-cath removal Left 10/16/2012    Procedure: REMOVAL PORT-A-CATH;  Surgeon: SMelrose Nakayama MD;  [facility]  Service: Thoracic;  Laterality: Left;  . Cholecystectomy N/A 08/29/2013    Procedure: LAPAROSCOPIC CHOLECYSTECTOMY;  Surgeon: MJamesetta So MD;  Location: [facility];  Service: General;  Laterality: N/A;    Denies any headaches, dizziness, double vision, fevers, chills, night sweats, nausea, vomiting, diarrhea, constipation, chest pain, heart palpitations, shortness of breath, blood in stool, black tarry stool, urinary pain, urinary burning, urinary frequency, hematuria.   PHYSICAL EXAMINATION  ECOG PERFORMANCE STATUS: 1 - Symptomatic but completely ambulatory  Filed Vitals:   02/17/14 1524  BP: 139/99  Pulse: 87  Temp: 98.1 F (36.7 C)  Resp: 18    GENERAL:alert, no distress, well nourished, well developed, comfortable, cooperative and smiling SKIN: skin color, texture, turgor are normal, no rashes or significant lesions HEAD: Normocephalic, No masses, lesions, tenderness or abnormalities EYES: normal, PERRLA, EOMI, Conjunctiva are pink and non-injected EARS: External ears normal OROPHARYNX:mucous membranes are moist  NECK: supple, trachea midline LYMPH:  not examined BREAST:not examined LUNGS: not examined HEART: not examined ABDOMEN:not examined BACK: Back symmetric, no curvature. EXTREMITIES:less then 2 second capillary refill, no skin discoloration, no clubbing, no cyanosis  NEURO: alert & oriented x 3 with fluent speech, no focal motor/sensory deficits, gait normal   LABORATORY DATA: CBC    Component Value Date/Time   WBC 4.0 02/05/2014 1124   WBC 2.5* 11/13/2012 1552   RBC 4.09 02/05/2014 1124   RBC 3.04* 11/13/2012 1552   HGB 13.2 02/05/2014 1124   HGB 10.5* 11/13/2012 1552     HCT 37.4 02/05/2014 1124   HCT 29.6* 11/13/2012 1552   PLT 182 02/05/2014 1124   PLT 146 11/13/2012 1552   MCV 91.4 02/05/2014 1124   MCV 97.6 11/13/2012 1552   MCH 32.3 02/05/2014 1124   MCH 34.7* 11/13/2012 1552   MCHC 35.3 02/05/2014 1124   MCHC 35.6 11/13/2012 1552   RDW 12.1 02/05/2014 1124   RDW 16.3* 11/13/2012 1552   LYMPHSABS 0.6* 02/05/2014 1124   LYMPHSABS 0.1* 11/13/2012 1552   MONOABS 0.2 02/05/2014 1124   MONOABS 0.4 11/13/2012 1552   EOSABS 0.1 02/05/2014 1124   EOSABS 0.1 11/13/2012 1552   BASOSABS 0.0 02/05/2014 1124   BASOSABS 0.0 11/13/2012 1552    Lab Results  Component Value Date   IRON 163* 02/05/2014   TIBC 353 02/05/2014   FERRITIN 153 02/05/2014   Lab Results  Component Value Date   TSH 3.200 02/05/2014   Results for DECLAN, ADAMSON (MRN [id number]) as of 02/15/2014 11:42  Ref. Range 02/05/2014 11:22  CK, MB Latest Range: 0.3-4.0 ng/mL 1.7  CK Total Latest Range: 7-177 U/L 89   Results for AMELYA, MABRY (MRN [id number]) as of 02/15/2014 11:42  Ref. Range 02/05/2014 11:24  LDH Latest Range: 94-250 U/L 155   Results for ALVIA, JABLONSKI (MRN [id number]) as of 02/15/2014 11:42  Ref. Range 02/05/2014 11:24  Folate No range found 13.0   Results for JAILEE, JAQUEZ (MRN [id number]) as of 02/15/2014 11:42  Ref. Range 02/05/2014 11:24  Beta-2 Microglobulin Latest Range: <=2.51 mg/L 1.72  Copper Latest Range: 70-175 mcg/dL 176 (H)  Vit D, 25-Hydroxy Latest Range: 30-89 ng/mL 80  Vitamin B-12 Latest Range: 211-911 pg/mL 1986 (H)     RADIOGRAPHIC STUDIES:  02/09/2014  CLINICAL DATA: Subsequent treatment strategy for lymphoma.  EXAM:  NUCLEAR MEDICINE PET SKULL BASE TO THIGH  TECHNIQUE:  9.97 mCi F-18 FDG was injected intravenously. Full-ring PET imaging  was performed from the skull base to thigh after the radiotracer. CT  data was obtained and used for attenuation correction and anatomic  localization.  FASTING BLOOD GLUCOSE: Value: 89 mg/dl  COMPARISON: None.   FINDINGS:  NECK  No hypermetabolic lymph nodes in the neck.  CHEST  No hypermetabolic mediastinal or hilar nodes. No suspicious  pulmonary nodules on the CT scan. The triangular-shaped area of  increased soft tissue within the anterior mediastinum measures 2.1 x  4.7 cm. The SUV max within this area is equal to 2.36, Deauville  Criteria 2.  ABDOMEN/PELVIS  No abnormal hypermetabolic activity within the liver, pancreas,  adrenal glands, or spleen. No hypermetabolic lymph nodes in the  abdomen or pelvis.  SKELETON  No focal hypermetabolic activity to suggest skeletal metastasis.  IMPRESSION:  1. Persistent soft tissue within the anterior mediastinum with very  low level metabolic activity (Deauville Criteria 2). Findings likely  reflect treated tumor.  Electronically Signed  By:  Kerby Moors M.D.  On: 02/09/2014 14:55    ASSESSMENT:  1. Progressive fatigue, suspect depression-related.  Negative hematologic work-up as cause. 2. 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 She was then switched to dose adjusted BEACOPP x2 cycles. Her PET scan after the second cycle showed complete remission. Because of probable inflammatory changes in the lungs and a prior Citrobacter blood stream/pulmonary infection felt to be the cause of the PET scan findings in the lungs she has had Levaquin which she is now finished. Now S/P involved field radiation by Dr. Thea Silversmith ([place]) finishing on 11/20/2012.  3 Suspected radiation recall, treated and resolved.  4. S/P cholecystectomy by Dr. Arnoldo Morale on 08/29/2013.  5. Fibromyalgia  Patient Active Problem List   Diagnosis Date Noted  . Leg edema 01/28/2014  . S/P cholecystectomy 09/14/2013  . Citrobacter infection 09/30/2012   . Hodgkin's lymphoma 03/20/2012  . Fibromyalgia 02/29/2012  . Eczema   . Hypertension   . Depression   . Restless leg       PLAN:  1. I personally reviewed and went over laboratory results with the patient.  The results are noted within this dictation. 2. I personally reviewed and went over radiographic studies with the patient.  The results are noted within this dictation.   3. MRI breast w wo contrast for breast cancer surveillance given her mediastinal radiation and per NCCN guidelines  4. Patient education regarding her recurrence risk and treatment associated side effects. 5. Labs in 3 and 6 months: CBC diff, CMET, ESR, LDH, B2M 6. CT CAP with contrast in 6 months for surveillance per NCCN guidelines 7. Influenza vaccine in 6 weeks. 8. Follow-up with primary care provider for depression management 9. Rx for Zoloft 50 mg daily with 1 refill. 10. Return in 6 weeks for follow-up    THERAPY PLAN:  After consultation with the patient's primary care provider, and a negative work-up for Hodgkin's Lymphoma recurrence, I suspect depression-induced fatigue.  Therefore, we will start an anti-depressant.  We will continue to follow surveillance per NCCN guidelines.  NCCN guidelines for surveillance of Hodgkin's Lymphoma (Stage I-IV) for first 5 years are as follows:  A. H+P every 3-6 months for years 1-2, then every 6-12 months until year 3, then annually.   B. Annual influenza vaccine.  C. Laboratory studies: CBC diff, ESR (if elevated at time of initial diagnosis), CMET.  TSH at least annually if RT to neck.  D. Chest x-ray of Ct every 6-12 months during first 2 years, then chest x-ray is optional.  E. Abdominal/pelvic CT every 6-12 months for first 2 years  F. Counseling: Reproduction, health habits, psychosocial, cardiovascular, breast self-exam, skin cancer risk, end-of treatment discussion.  G. Surveillance PET scan should not be done routinely due to risk for false positive.   Management decisions should not be based on PET scan alone; clinical or pathologic correlation is needed.   NCCN Guidelines for monitoring for late effects after 5 years are:  A. H+P annually  B. Annual blood pressure, aggressive management of cardiovascular risk factors  C. Pneumococcal, meningococcal, and H. Flue revaccination after 5-7 years, if patient treated with splenic RT of previous splenectomy  D. Annual influenza vaccine.  E. Cardiovascular symptoms may emerge at a young age  1. Consider stress test/echocardiogram at 10 year intervals after treatment is completed, especially if chest cardiac irradiation.   2. Consider carotid US at 10 year intervals if neck irradiation.  F. Lab studies: CBC diff, CMET annually and TSH at least annually if RT to neck and annual lipids.  G. Consider chest imaging for patients at increased risk for lung cancer.  H. Annual breast screening:  Initiate 8-10 years post-therapy, or at age 29, whichever comes first, if chest or axillary radiation.  The NCCN Hodgkin Lymphoma Guideline s Panel recommends breast MRI in addition to mammography for women who received irradiation to the chest between ages 76 and 30 years, which is consistent with the American Cancer Society Guidelines.  I. Counseling: reproduction, health habits, psychosocial, cardiovascular, breast self-exam, and skin cancer risk.  J. Treatment summary and consideration of transfer to PCP.  K. Consider a referral to a survivorship clinic.   All questions were answered. The patient knows to call the clinic with any problems, questions or concerns. We can certainly see the patient much sooner if necessary.  Patient and plan discussed with Dr. Farrel Gobble and he is in agreement with the aforementioned.   Rhen Kawecki 02/17/2014

## 2014-02-17 ENCOUNTER — Encounter (HOSPITAL_COMMUNITY): Payer: BC Managed Care – PPO | Attending: Oncology | Admitting: Oncology

## 2014-02-17 ENCOUNTER — Encounter (HOSPITAL_COMMUNITY): Payer: Self-pay | Admitting: Oncology

## 2014-02-17 VITALS — BP 139/99 | HR 87 | Temp 98.1°F | Resp 18 | Wt 205.0 lb

## 2014-02-17 DIAGNOSIS — F32A Depression, unspecified: Secondary | ICD-10-CM

## 2014-02-17 DIAGNOSIS — F3289 Other specified depressive episodes: Secondary | ICD-10-CM

## 2014-02-17 DIAGNOSIS — R5381 Other malaise: Secondary | ICD-10-CM

## 2014-02-17 DIAGNOSIS — R5383 Other fatigue: Secondary | ICD-10-CM

## 2014-02-17 DIAGNOSIS — C8190 Hodgkin lymphoma, unspecified, unspecified site: Secondary | ICD-10-CM

## 2014-02-17 DIAGNOSIS — F329 Major depressive disorder, single episode, unspecified: Secondary | ICD-10-CM

## 2014-02-17 MED ORDER — SERTRALINE HCL 50 MG PO TABS: 50.0000 mg | ORAL_TABLET | Freq: Every day | ORAL | Status: DC

## 2014-02-17 NOTE — Patient Instructions (Signed)
[facility] Discharge Instructions  RECOMMENDATIONS MADE BY THE CONSULTANT AND ANY TEST RESULTS WILL BE SENT TO YOUR REFERRING PHYSICIAN.  Return in 6 weeks for office visit with Tom. Lab work in 3 months.  Thank you for choosing [facility] to provide your oncology and hematology care.  To afford each patient quality time with our providers, please arrive at least 15 minutes before your scheduled appointment time.  With your help, our goal is to use those 15 minutes to complete the necessary work-up to ensure our physicians have the information they need to help with your evaluation and healthcare recommendations.    Effective January 1st, 2014, we ask that you re-schedule your appointment with our physicians should you arrive 10 or more minutes late for your appointment.  We strive to give you quality time with our providers, and arriving late affects you and other patients whose appointments are after yours.    Again, thank you for choosing [facility].  Our hope is that these requests will decrease the amount of time that you wait before being seen by our physicians.       _____________________________________________________________  Should you have questions after your visit to [facility], please contact our office at [phone] between the hours of 8:30 a.m. and 4:30 p.m.  Voicemails left after 4:30 p.m. will not be returned until the following business day.  For prescription refill requests, have your pharmacy contact our office with your prescription refill request.    _______________________________________________________________  We hope that we have given you very good care.  You may receive a patient satisfaction survey in the mail, please complete it and return it as soon as possible.  We value your feedback!  _______________________________________________________________  Have you asked about our STAR program?   STAR stands for Survivorship Training and Rehabilitation, and this is a nationally recognized cancer care program that focuses on survivorship and rehabilitation.  Cancer and cancer treatments may cause problems, such as, pain, making you feel tired and keeping you from doing the things that you need or want to do. Cancer rehabilitation can help. Our goal is to reduce these troubling effects and help you have the best quality of life possible.  You may receive a survey from a nurse that asks questions about your current state of health.  Based on the survey results, all eligible patients will be referred to the [facility] program for an evaluation so we can better serve you!  A frequently asked questions sheet is available upon request.

## 2014-02-17 NOTE — Progress Notes (Signed)
STAR Program Physical Impairment and Functional Assessment Screening Tool  1. Are you having any pain, including headaches, joint pain, or muscle pain (upper body = OT; lower body = PT)?  Yes, but I hand this before my cancer diagnosis.  2. Do your hands and/or feet feel numb or tingle (PT)?  No  3. Does any part of your body feel swollen or larger than usual (upper body = OT; lower body = PT)?  Yes, this started after my diagnosis and is still a problem.  4. Are you so tired that you cannot do the things you want or need to do (PT or OT)?  Yes, this started after my diagnosis and is still a problem.  5. Are you feeling weak or are you having trouble moving any part of your body (PT/OT)?  Yes, this started after my diagnosis and is still a problem.  6. Are you having trouble concentrating, thinking, or remembering things (OT/ST)?  Yes, this started after my diagnosis and is still a problem.  7. Are you having trouble moving around or feel like you might trip or fall (PT)?  Yes, this started after my diagnosis and is still a problem.  8. Are you having trouble swallowing (ST)?  No  9. Are you having trouble speaking (ST)?  Yes, this started after my diagnosis and is still a problem.  Can't remember words.  10. Are you having trouble with going or getting to the bathroom (OT)?  No  11. Are you having trouble with your sexual function (OT)?  No  12. Are you having trouble lifting things, even just your arms (OT/PT)?  Yes, this started after my diagnosis and is still a problem.  69. Are you having trouble taking care of yourself as in dressing or bathing (OT)?  No  14. Are you having trouble with daily tasks like chores or shopping (OT)?  Yes, but I hand this before my cancer diagnosis.  15. Are you having trouble driving (OT)?  Yes, this started after my diagnosis and is still a problem.  70. Are you having trouble returning to work or completing your tasks  at work (OT)?  Yes, this started after my diagnosis and is still a problem.  Other concerns:    Legend: OT = Occupational Therapy PT = Physical Therapy ST = Speech Therapy

## 2014-02-25 ENCOUNTER — Telehealth (HOSPITAL_COMMUNITY): Payer: Self-pay | Admitting: Oncology

## 2014-02-26 ENCOUNTER — Telehealth (HOSPITAL_COMMUNITY): Payer: Self-pay

## 2014-02-26 NOTE — Telephone Encounter (Signed)
She should follow-up with Dr. Nevada Crane then.  He may want to prescribe her a different antidepressant; especially if the one I prescribed is interfering with her sleeping patterns.

## 2014-02-26 NOTE — Telephone Encounter (Signed)
B[facility], PA-C at 02/26/2014 3:14 PM     Status: Signed        She should follow-up with Dr. Nevada Crane then. He may want to prescribe her a different antidepressant; especially if the one I prescribed is interfering with her sleeping patterns.   Message left on patient's voicemail.  To call back with any questions.

## 2014-03-05 ENCOUNTER — Ambulatory Visit (HOSPITAL_COMMUNITY)
Admission: RE | Admit: 2014-03-05 | Discharge: 2014-03-05 | Disposition: A | Discharge status: Home / Self Care | Payer: BC Managed Care – PPO | Source: Ambulatory Visit | Attending: Oncology | Admitting: Oncology

## 2014-03-05 DIAGNOSIS — M79609 Pain in unspecified limb: Secondary | ICD-10-CM | POA: Diagnosis not present

## 2014-03-05 DIAGNOSIS — M545 Low back pain, unspecified: Secondary | ICD-10-CM | POA: Insufficient documentation

## 2014-03-05 DIAGNOSIS — R262 Difficulty in walking, not elsewhere classified: Secondary | ICD-10-CM | POA: Insufficient documentation

## 2014-03-05 DIAGNOSIS — M6281 Muscle weakness (generalized): Secondary | ICD-10-CM | POA: Insufficient documentation

## 2014-03-05 DIAGNOSIS — I10 Essential (primary) hypertension: Secondary | ICD-10-CM | POA: Diagnosis not present

## 2014-03-05 DIAGNOSIS — IMO0001 Reserved for inherently not codable concepts without codable children: Secondary | ICD-10-CM | POA: Insufficient documentation

## 2014-03-05 NOTE — Evaluation (Addendum)
Physical Therapy Evaluation Patient Details  Name: [name] [name] MRN: [id number] Date of Birth: April 23, 1971  Today's Date: 03/05/2014 Time: 9629-5284 PT Time Calculation (min): 43 min      Charges: 1 Evaluation, Self Care Management 1722-1735        Visit#: 1 of 24  Re-eval: 04/04/14 Assessment Diagnosis: weakness and pain s/p Hopgkins cancer Next MD Visit: December Prior Therapy: No  Authorization: [employer]    Authorization Time Period:    Authorization Visit#:   of     Past Medical History:  Past Medical History  Diagnosis Date  . Fibromyalgia   . Eczema   . Depression     parents died close together "was hard"  . Restless leg   . Sleep apnea     Sleep Study- 1996- "little apnea- no tx"  . Headache(784.0)     Birth control pills help  . Hemorrhoid   . Anemia     As a child and while pregnant  . 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 Hypertension     recently dx with hodgkins  . Dyspnea     all the time .".due to the mass"  . History of radiation therapy 10/28/12-11/20/12    chest & neck,34Gy/29f  . S/P cholecystectomy 09/14/2013    08/29/2013 by Dr. JArnoldo Morale  Negative pathology   Past Surgical History:  Past Surgical History  Procedure Laterality Date  . Wisdom tooth extraction    . Dilation and curettage of uterus  1991  . Mediastinal mass biopsy  03/05/12  . Bone marrow biopsy  03/20/2012  . Portacath placement  03/26/2012    Procedure: INSERTION PORT-A-CATH;  Surgeon: SMelrose Nakayama MD;  Location: [facility]  Service: Thoracic;  Laterality: N/A;  . Port-a-cath removal Left 10/16/2012    Procedure: REMOVAL PORT-A-CATH;  Surgeon: SMelrose Nakayama MD;  [facility]  Service: Thoracic;  Laterality: Left;  . Cholecystectomy N/A 08/29/2013    Procedure: LAPAROSCOPIC CHOLECYSTECTOMY;  Surgeon: MJamesetta So MD;  Location: [facility];  Service: General;  Laterality: N/A;    Subjective Symptoms/Limitations Symptoms: N/t in  fingersand toes, decreased balance, weakness/pain throughtou body. Difficulty opening botttle/cans, decreased hand eye coordination. "My head feels foggy." Patient has tried regular exercisise but "I seem to crash and can not walk more than a half a mile 2 days in a row."  Pertinent History: Patient diagnosed with Hodgkin's in 2013, Lat bouts of chemo in april 2014, and radiation in June 2014. 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 N/t in fingersand toes, decreased balance, weakness/pain throughtou body. Difficulty opening botttle/cans, decreased hand eye coordination. "My head feels foggy." Patient has tried regular exercisise but "I seem to crash and can not walk more than a half a mile 2 days in a row."  no falls.  How long can you walk comfortably?: <153mutes Patient Stated Goals: to be able to iwalk >3066mtes, increase strength, increase balance.  Pain Assessment Currently in Pain?: Yes Pain Score: 9  Pain Location:  (full body) Pain Type: Chronic pain Pain Onset: More than a month ago Pain Frequency: Constant Pain Relieving Factors: noting and not sure what will Effect of Pain on Daily Activities: rest.   Sensation/Coordination/Flexibility/Functional Tests Functional Tests: FACIT-F Trial Outcome Index 32, FACT-G 65, FACIT-F 74 Functional Tests: TUG: 15.52 patient at increased risk of falls Functional Tests: 5x Sit to stand: 20.12 Patient  at increased risk of falls  Assessment RLE AROM (degrees) Right Ankle Dorsiflexion: 5 RLE Strength Right Hip Flexion: 3+/5 Right Hip Extension: 3+/5 Right Hip External Rotation : 45 Right Hip Internal Rotation : 19 Right Hip ABduction: 3+/5 Right Knee Flexion:  (4-/5) Right Knee Extension: 4/5 Right Ankle Dorsiflexion: 4/5 LLE AROM (degrees) Left Hip External Rotation : 40 Left Hip Internal Rotation : 25 Left Ankle Dorsiflexion: 5 LLE Strength Left Hip Flexion:  3+/5 Left Hip Extension: 3+/5 Left Hip ABduction: 3+/5 Left Knee Flexion:  (4-/5) Left Knee Extension: 4/5 Left Ankle Dorsiflexion: 4/5 Lumbar AROM Overall Lumbar AROM: Deficits Overall Lumbar AROM Comments: Overall limited, not formally assessed, to be assessed at later date.  Lumbar Extension: 50% limited Lumbar Strength Lumbar Flexion: 2+/5 Lumbar Extension: 2+/5 Palpation Palpation: Noted swelling in bilateral LE  Exercise/Treatments 3D hip Excursions 10x  Physical Therapy Assessment and Plan PT Assessment and Plan Clinical Impression Statement: Patient displasy low back pain and pain in bilateral LE secondary to a history of chemo and radiation treatments for Hodgkins cancer resulting in limited  LE mobility and decreased strength. Patient will benefit from skileed phsyical therapy to increase LE strength and lumbar spine stability to improve gait mechnics and activity tolerance so patient can return to work without pain and return to performing all ADL's independently.  Noted likely edema in bilateral LE.  Pt will benefit from skilled therapeutic intervention in order to improve on the following deficits: Abnormal gait;Decreased activity tolerance;Decreased balance;Difficulty walking;Pain;Decreased strength;Impaired sensation;Impaired flexibility;Improper body mechanics;Increased fascial restricitons;Decreased mobility;Decreased range of motion;Increased edema Rehab Potential: Good Clinical Impairments Affecting Rehab Potential: highly motivated PT Frequency: Min 2X/week PT Duration: 12 weeks PT Treatment/Interventions: Gait training;Stair training;Functional mobility training;Therapeutic exercise;Therapeutic activities;Balance training;Neuromuscular re-education;Patient/family education;Manual techniques;Modalities PT Plan: Focus of therapy on increasing bilateral hip mobility and stability as well as increasing abdominal strength to increase balance and trunk stability to decrease  back pain and aincrease activity tolerance so patient can begin a regular walking program. Next session introduce LE stretches and piriformis stretch to be performed by patient daily.     Goals Home Exercise Program Pt/caregiver will Perform Home Exercise Program: For increased ROM PT Goal: Perform Home Exercise Program - Progress: Goal set today PT Short Term Goals Time to Complete Short Term Goals: 4 weeks PT Short Term Goal 1: Patient will be bale to internally rotate bilateral LE >30 degrees to improve deceleration mechnics of gait PT Short Term Goal 2: Patient will be able to dorsiflex ankles >13 degrees bilaterally to increase stride length and decrease early heel off during gait.  PT Short Term Goal 3: Patient will dmeosntrate improved glute strength bilaterally to 4-/5 so patient can perform the 5x sit to stand stest in <15 seconds indicating patient not at high risk for falls. PT Short Term Goal 4: Patient will demosntrate increased abdominal muscle strength to 4/5 MMT indicatign increased trunk stability PT Long Term Goals Time to Complete Long Term Goals: 12 weeks PT Long Term Goal 1: Patient will be able to ambualte up and down stairs without hand hold asssistance to be able to carry things up stairs in home.   PT Long Term Goal 2: Patient will be able to perform single leg stand bilaterally >30 seconds to indicate improved glut med strength of 4/5 MMT. Long Term Goal 3: Patient will dmeosntrate increased trunk flexion strenght of 5/5 to demsontrate increased trunk stability. Long Term Goal 4: Patient will be able to lift 20lb from the floor and  demosntrate hip extension strength of 4+/5 MMT so patient can perform normal ADL's. PT Long Term Goal 5: Patient will be able to walk >80mnes with pain <2/10.  Problem List Patient Active Problem List   Diagnosis Date Noted  . Leg edema 01/28/2014  . S/P cholecystectomy 09/14/2013  . Citrobacter infection 09/30/2012  . Hodgkin's  lymphoma 03/20/2012  . Fibromyalgia 02/29/2012  . Eczema   . Hypertension   . Depression   . Restless leg     PT - End of Session Activity Tolerance: Patient tolerated treatment well General Behavior During Therapy: WFL for tasks assessed/performed PT Plan of Care PT Home Exercise Plan: 3D hip excursions 10x Consulted and Agree with Plan of Care: Patient  GP    DLeia Alf9/24/2015, 7:08 PM  Physician Documentation Your signature is required to indicate approval of the treatment plan as stated above.  Please sign and either send electronically or make a copy of this report for your files and return this physician signed original.   Please mark one 1.__approve of plan  2. ___approve of plan with the following conditions.   ______________________________                                                          _____________________ Physician Signature                                                                                                             Date

## 2014-03-17 ENCOUNTER — Ambulatory Visit (HOSPITAL_COMMUNITY)
Admission: RE | Admit: 2014-03-17 | Discharge: 2014-03-17 | Disposition: A | Discharge status: Home / Self Care | Payer: BC Managed Care – PPO | Source: Ambulatory Visit | Attending: Oncology | Admitting: Oncology

## 2014-03-17 DIAGNOSIS — M79604 Pain in right leg: Secondary | ICD-10-CM | POA: Insufficient documentation

## 2014-03-17 DIAGNOSIS — R262 Difficulty in walking, not elsewhere classified: Secondary | ICD-10-CM | POA: Diagnosis not present

## 2014-03-17 DIAGNOSIS — I10 Essential (primary) hypertension: Secondary | ICD-10-CM | POA: Insufficient documentation

## 2014-03-17 DIAGNOSIS — Z5189 Encounter for other specified aftercare: Secondary | ICD-10-CM | POA: Diagnosis not present

## 2014-03-17 DIAGNOSIS — M79605 Pain in left leg: Secondary | ICD-10-CM | POA: Diagnosis not present

## 2014-03-17 DIAGNOSIS — M6281 Muscle weakness (generalized): Secondary | ICD-10-CM | POA: Diagnosis not present

## 2014-03-17 DIAGNOSIS — M545 Low back pain: Secondary | ICD-10-CM | POA: Insufficient documentation

## 2014-03-17 NOTE — Progress Notes (Signed)
Physical Therapy Treatment Patient Details  Name: [name] [name] MRN: [id number] Date of Birth: 03-Aug-1970  Today's Date: 03/17/2014 Time: 1516-1600 PT Time Calculation (min): 44 min Charge: TE 1516-1600  Visit#: 2 of 24  Re-eval:   Assessment Diagnosis: weakness and pain s/p Hopgkins cancer Next MD Visit: Lowella Curb 04/07/2014 Prior Therapy: No  Authorization: [employer]  Authorization Time Period:    Authorization Visit#:   of     Subjective: Symptoms/Limitations Symptoms: Pt stated no real pain today feels really stiff today.  Only time it really hurts is when triying to bend forward and touch toes.   Pain Assessment Currently in Pain?: No/denies  Objective:   Exercise/Treatments Stretches Active Hamstring Stretch: 3 reps;30 seconds;Limitations Active Hamstring Stretch Limitations: Bil UE standing 3 direcitons 14in step Hip Flexor Stretch: 3 reps;30 seconds;Limitations Hip Flexor Stretch Limitations: 14in step Bil LE hip flexion and groin stretch 3 directins ITB Stretch: 3 reps;30 seconds;Limitations ITB Stretch Limitations: beside 6in step Piriformis Stretch: 3 reps;30 seconds;Limitations Piriformis Stretch Limitations: seated 3 directions Standing Functional Squats: 10 reps;Limitations Functional Squats Limitations: 3D hip excursion Prone  Other Prone Lumbar Exercises: child's pose 3x 30" 3 directions    Physical Therapy Assessment and Plan PT Assessment and Plan Clinical Impression Statement: Pt instructed LE stretches and given HEP worksheet to begin at home, pt encouraged to begin 2x daily.  VAS fatigue score complete with outcome in scanned files.  Pt given STAR notebook and pedometer and encouraged to begin daily walking program.  No reports of pain through sessoin, pt stated she felt improved flexibilty and reports understanding of all new stretches.   PT Plan: Next sessoin f/u with STAR notebook, answer questions and review daily walking.  Continue with current PT  POC with primary focus on improving hip mobilty and stability, progress abdominal strength to improve balance and trunk stability  Begin quad stretch next session.      Goals PT Short Term Goals PT Short Term Goal 1: Patient will be bale to internally rotate bilateral LE >30 degrees to improve deceleration mechnics of gait PT Short Term Goal 1 - Progress: Progressing toward goal PT Short Term Goal 2: Patient will be able to dorsiflex ankles >13 degrees bilaterally to increase stride length and decrease early heel off during gait.  PT Short Term Goal 2 - Progress: Progressing toward goal PT Short Term Goal 3: Patient will dmeosntrate improved glute strength bilaterally to 4-/5 so patient can perform the 5x sit to stand stest in <15 seconds indicating patient not at high risk for falls. PT Short Term Goal 3 - Progress: Progressing toward goal PT Short Term Goal 4: Patient will demosntrate increased abdominal muscle strength to 4/5 MMT indicatign increased trunk stability PT Long Term Goals PT Long Term Goal 1: Patient will be able to ambualte up and down stairs without hand hold asssistance to be able to carry things up stairs in home.   PT Long Term Goal 2: Patient will be able to perform single leg stand bilaterally >30 seconds to indicate improved glut med strength of 4/5 MMT. Long Term Goal 3: Patient will dmeosntrate increased trunk flexion strenght of 5/5 to demsontrate increased trunk stability. Long Term Goal 3 Progress: Progressing toward goal Long Term Goal 4: Patient will be able to lift 20lb from the floor and demosntrate hip extension strength of 4+/5 MMT so patient can perform normal ADL's. PT Long Term Goal 5: Patient will be able to walk >23mines with pain <2/10.  Problem List  Patient Active Problem List   Diagnosis Date Noted  . Leg edema 01/28/2014  . S/P cholecystectomy 09/14/2013  . Citrobacter infection 09/30/2012  . Hodgkin's lymphoma 03/20/2012  . Fibromyalgia  02/29/2012  . Eczema   . Hypertension   . Depression   . Restless leg     PT - End of Session Activity Tolerance: Patient tolerated treatment well General Behavior During Therapy: [facility] for tasks assessed/performed  GP    Aldona Lento 03/17/2014, 4:07 PM

## 2014-03-19 ENCOUNTER — Ambulatory Visit (HOSPITAL_COMMUNITY)
Admission: RE | Admit: 2014-03-19 | Discharge: 2014-03-19 | Disposition: A | Discharge status: Home / Self Care | Payer: BC Managed Care – PPO | Source: Ambulatory Visit | Attending: Internal Medicine | Admitting: Internal Medicine

## 2014-03-19 DIAGNOSIS — Z5189 Encounter for other specified aftercare: Secondary | ICD-10-CM | POA: Diagnosis not present

## 2014-03-19 NOTE — Progress Notes (Signed)
Physical Therapy Treatment Patient Details  Name: SHANNEN FLANSBURG MRN: [id number] Date of Birth: May 21, 1971  Today's Date: 03/19/2014 Time: 0800-0845 PT Time Calculation (min): 82 min TE [id number]  Visit#: 3 of 24  Re-eval: 04/04/14 Assessment Diagnosis: weakness and pain s/p Hopgkins cancer Next MD Visit: Lowella Curb 04/07/2014  Subjective: Symptoms/Limitations Symptoms: No complaints of pain, only stiffness.  Pt reports feeling good and looser after last treatment session, however stiffness returned after sleeping that night.     Exercise/Treatments Stretches Active Hamstring Stretch: 3 reps;20 seconds Active Hamstring Stretch Limitations: 3 Way on 14" Step Passive Hamstring Stretch: 3 reps;30 seconds;Limitations Passive Hamstring Stretch Limitations: Press photographer on Slantboard Hip Flexor Stretch: 3 reps;20 seconds Hip Flexor Stretch Limitations: 14" Step Quad Stretch: 2 reps;30 seconds;Limitations Quad Stretch Limitations: Prone with rope Piriformis Stretch: 3 reps;20 seconds Piriformis Stretch Limitations: 3 Way, seated Standing Functional Squats: Limitations Functional Squats Limitations: 3D Squat Matrix with 2# weights Other Standing Lumbar Exercises: 3D Hip Excursion Sidelying Hip Abduction: 20 reps Prone  Straight Leg Raise: 20 reps Other Prone Lumbar Exercises: Child's Pose, 20" x3 in 3 directions Other Prone Lumbar Exercises: Donkey Kick, 2x10 each  Physical Therapy Assessment and Plan PT Assessment and Plan Clinical Impression Statement: Pt reports feeling good after last treatment, though stiffness returned by next day.  No complaints of pain with stretches, only stretching into low back and (B) LE.  VC with SLR ABD to avoid compensations of hip flexion secondary to poor body awareness; also noted difficulty with technique with squatting requiring VC to decrease depth in order to correctly perform exercise with good body mechanics.  Pt will benefit from  skilled therapeutic intervention in order to improve on the following deficits: Abnormal gait;Decreased activity tolerance;Decreased balance;Difficulty walking;Pain;Decreased strength;Impaired sensation;Impaired flexibility;Improper body mechanics;Increased fascial restricitons;Decreased mobility;Decreased range of motion;Increased edema PT Treatment/Interventions: Gait training;Stair training;Functional mobility training;Therapeutic exercise;Therapeutic activities;Balance training;Neuromuscular re-education;Patient/family education;Manual techniques;Modalities PT Plan: Next sessoin f/u with STAR notebook, answer questions and review daily walking.  Continue with current PT POC with primary focus on improving hip stability, progress abdominal strength to improve balance and trunk stability.     Goals PT Short Term Goals PT Short Term Goal 1: Patient will be bale to internally rotate bilateral LE >30 degrees to improve deceleration mechnics of gait PT Short Term Goal 1 - Progress: Progressing toward goal PT Short Term Goal 2: Patient will be able to dorsiflex ankles >13 degrees bilaterally to increase stride length and decrease early heel off during gait.  PT Short Term Goal 2 - Progress: Progressing toward goal PT Short Term Goal 4: Patient will demosntrate increased abdominal muscle strength to 4/5 MMT indicatign increased trunk stability PT Short Term Goal 4 - Progress: Progressing toward goal  Problem List Patient Active Problem List   Diagnosis Date Noted  . Leg edema 01/28/2014  . S/P cholecystectomy 09/14/2013  . Citrobacter infection 09/30/2012  . Hodgkin's lymphoma 03/20/2012  . Fibromyalgia 02/29/2012  . Eczema   . Hypertension   . Depression   . Restless leg     PT - End of Session Activity Tolerance: Patient tolerated treatment well General Behavior During Therapy: [facility] for tasks assessed/performed   Jaquae Rieves 03/19/2014, 8:50 AM

## 2014-03-29 NOTE — Progress Notes (Signed)
[name] Cahill, MD  [place] [address]  Hodgkin's lymphoma - Plan: JUNEL FE 1/20 1-20 MG-MCG tablet, KRILL OIL PO, VITAMIN A PO, CBC with Differential, Comprehensive metabolic panel, Lactate dehydrogenase, Beta 2 microglobuline, serum, CBC with Differential, Comprehensive metabolic panel, Lactate dehydrogenase, Beta 2 microglobuline, serum  Foreign body in eyeball, left, subsequent encounter - Plan: DG Eye Foreign Body  CURRENT THERAPY: Surveillance per NCCN guidelines  INTERVAL HISTORY: [name] [name] 43 y.o. 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 She was then switched to dose adjusted BEACOPP x2 cycles. Her PET scan after the second cycle showed complete remission. Because of probable inflammatory changes in the lungs and a prior Citrobacter blood stream/pulmonary infection felt to be the cause of the PET scan findings in the lungs she has had Levaquin which she is now finished. Now S/P involved field radiation by Dr. Thea Silversmith ([place]) finishing on 11/20/2012.  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 She was then switched to dose adjusted BEACOPP x2 cycles. Her PET scan after the second cycle showed complete remission. Because of probable inflammatory changes in the lungs and a prior Citrobacter blood stream/pulmonary infection felt to be the cause of the PET scan findings in the lungs she has had Levaquin which she is now finished. Now S/P  involved field radiation by Dr. Thea Silversmith ([place]) finishing on 11/20/2012.     Hodgkin's lymphoma   09/14/2010 Imaging Chest xray demonstrating- Possible mediastinal mass as discussed above.  Recommend chest CT scan for further evaluation.  Further work-up by ordering physician never performed   02/26/2012 Imaging CT of chest- Large anterior mediastinal mass is identified and is worrisome for tumor.  This is encasing the great vessels and S causing significant narrowing of the left subclavian vein.   03/05/2012 Bone Marrow Biopsy Negative   03/05/2012 Initial Diagnosis Anterior mediastinal mass biopsy- Classical Hodgkin's lymphoma   03/13/2012 Imaging PET scan- hypermetabolic anterior mediastinal mass, extension of dominant mass in the low left neck, isolated small right lower jugular hypermetabolic node, no subdiaphragmatic disease.   03/27/2012 - 08/07/2012 Chemotherapy ABVD x 5 cycles but with incomplete response evaluated after cycle 3 and after cycle #5.   06/20/2012 Imaging PET scan- interval response to therapy as evidenced by decrease in size and metabolic activity involving the anterior mediastinal mass   08/16/2012 Imaging PET scan- The anterior mediastinal soft tissue mass shows no interval change in size and there is a persistent small peripheral focus of hypermetabolism within this lesion on today's exam.   08/28/2012 - 09/25/2012 Chemotherapy BEACOPP x 2 cycles   10/08/2012 Imaging PET scan- Today's study demonstrates a positive response to therapy with decrease in size and resolution of hypermetabolism within the anterior mediastinal nodal mass.  The mass currently measures 6.2 x 2.5 cm.   10/09/2012 Remission    10/23/2012 - 11/20/2012 Radiation Therapy Dr. Pablo Ledger   12/02/2012 Adverse Reaction Radiation recall- negative work-up.  Treated  with Prednisone.   05/26/2013 Imaging PET scan- No substantial change in size of the anterior mediastinal mass although there is no hypermetabolism within  this lesion on the current study.  Interval decrease in spleen size.   11/12/2013 Imaging CT CAP- decrease size in medistinal mass.  NED.   02/09/2014 PET scan Persistent soft tissue within the anterior mediastinum with very low level metabolic activity (Deauville Criteria 2). Findings likely reflect treated tumor.    I personally reviewed and went over laboratory results with the patient.  The results are noted within this dictation.  Clinically, she looks well today.  She reports that today is day #16 of working straight.  She reports that her fatigue is better and she attributes that to "fighting through the tiredness" which is the only evidenced-based treatment for chemotherapy/malignancy-induced fatigue.  Additionally, she is actively participating in the [facility] program which is likely contributing.   She reports that an MRI of breast cannot be performed until it is proven that she does not have metal foreign body in eye as she has a history of this.  I have called Dr. Nita Sickle (Radiology) and he has informed me on the path to rule out metallic foreign body in orbit.  I will order this.  She will be due for MRI of breast in June 2016 (6 months after her annual mammogram in December 2015).    Hematologically, she denies any complaints and ROS questioning is negative.    Past Medical History  Diagnosis Date  . Fibromyalgia   . Eczema   . Depression     parents died close together "was hard"  . Restless leg   . Sleep apnea     Sleep Study- 1996- "little apnea- no tx"  . Headache(784.0)     Birth control pills help  . Hemorrhoid   . Anemia     As a child and while pregnant  . 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 Hypertension     recently dx with hodgkins  . Dyspnea     all the time .".due to the mass"  . History of radiation therapy 10/28/12-11/20/12    chest & neck,34Gy/46f  . S/P cholecystectomy 09/14/2013    08/29/2013 by Dr. JArnoldo Morale  Negative pathology     has Fibromyalgia; Eczema; Hypertension; Depression; Restless leg; Hodgkin's lymphoma; Citrobacter infection; S/P cholecystectomy; and Leg edema on her problem list.     is allergic to other and sulfur.  Ms. TAkardhad no medications administered during this visit.  Past Surgical History  Procedure Laterality Date  . Wisdom tooth extraction    . Dilation and curettage of uterus  1991  . Mediastinal mass biopsy  03/05/12  . Bone marrow biopsy  03/20/2012  . Portacath placement  03/26/2012    Procedure: INSERTION PORT-A-CATH;  Surgeon: SMelrose Nakayama MD;  [facility]  Service: Thoracic;  Laterality: N/A;  . Port-a-cath removal Left 10/16/2012    Procedure: REMOVAL PORT-A-CATH;  Surgeon: SMelrose Nakayama MD;  [facility]  Service: Thoracic;  Laterality: Left;  . Cholecystectomy N/A 08/29/2013    Procedure: LAPAROSCOPIC CHOLECYSTECTOMY;  Surgeon: MJamesetta So MD;  Location: [facility];  Service: General;  Laterality: N/A;    Denies any headaches, dizziness, double vision, fevers, chills, night sweats, nausea, vomiting, diarrhea, constipation, chest pain, heart palpitations, shortness of breath, blood in stool, black tarry stool, urinary pain, urinary burning, urinary frequency, hematuria.   PHYSICAL  EXAMINATION  ECOG PERFORMANCE STATUS: 1 - Symptomatic but completely ambulatory  Filed Vitals:   03/31/14 1433  BP: 147/90  Pulse: 89  Temp: 98.3 F (36.8 C)  Resp: 16    GENERAL:alert, no distress, well nourished, well developed, comfortable, cooperative, obese and smiling SKIN: skin color, texture, turgor are normal, no rashes or significant lesions HEAD: Normocephalic, No masses, lesions, tenderness or abnormalities EYES: normal, PERRLA, EOMI, Conjunctiva are pink and non-injected EARS: External ears normal OROPHARYNX:lips, buccal mucosa, and tongue normal and mucous membranes are moist  NECK: supple, no adenopathy, thyroid normal size, non-tender, without  nodularity, no stridor, non-tender, trachea midline LYMPH:  no palpable lymphadenopathy, no hepatosplenomegaly BREAST:patient declines to have breast exam LUNGS: clear to auscultation and percussion HEART: regular rate & rhythm, no murmurs, no gallops, S1 normal and S2 normal ABDOMEN:abdomen soft, non-tender, obese, normal bowel sounds and no masses or organomegaly BACK: Back symmetric, no curvature., No CVA tenderness EXTREMITIES:less then 2 second capillary refill, no joint deformities, effusion, or inflammation, no edema, no skin discoloration, no clubbing, no cyanosis  NEURO: alert & oriented x 3 with fluent speech, no focal motor/sensory deficits, gait normal    LABORATORY DATA: CBC    Component Value Date/Time   WBC 4.0 02/05/2014 1124   WBC 2.5* 11/13/2012 1552   RBC 4.09 02/05/2014 1124   RBC 3.04* 11/13/2012 1552   HGB 13.2 02/05/2014 1124   HGB 10.5* 11/13/2012 1552   HCT 37.4 02/05/2014 1124   HCT 29.6* 11/13/2012 1552   PLT 182 02/05/2014 1124   PLT 146 11/13/2012 1552   MCV 91.4 02/05/2014 1124   MCV 97.6 11/13/2012 1552   MCH 32.3 02/05/2014 1124   MCH 34.7* 11/13/2012 1552   MCHC 35.3 02/05/2014 1124   MCHC 35.6 11/13/2012 1552   RDW 12.1 02/05/2014 1124   RDW 16.3* 11/13/2012 1552   LYMPHSABS 0.6* 02/05/2014 1124   LYMPHSABS 0.1* 11/13/2012 1552   MONOABS 0.2 02/05/2014 1124   MONOABS 0.4 11/13/2012 1552   EOSABS 0.1 02/05/2014 1124   EOSABS 0.1 11/13/2012 1552   BASOSABS 0.0 02/05/2014 1124   BASOSABS 0.0 11/13/2012 1552      Chemistry      Component Value Date/Time   NA 139 02/05/2014 1124   NA 136 11/13/2012 1552   K 3.6* 02/05/2014 1124   K 3.6 11/13/2012 1552   CL 102 02/05/2014 1124   CL 101 11/13/2012 1552   CO2 25 02/05/2014 1124   CO2 27 11/13/2012 1552   BUN 15 02/05/2014 1124   BUN 20.4 11/13/2012 1552   CREATININE 0.67 02/05/2014 1124   CREATININE 1.0 11/13/2012 1552      Component Value Date/Time   CALCIUM 9.7 02/05/2014 1124   CALCIUM 9.0 11/13/2012 1552   ALKPHOS 59 02/05/2014 1124     ALKPHOS 62 11/13/2012 1552   AST 17 02/05/2014 1124   AST 27 11/13/2012 1552   ALT 12 02/05/2014 1124   ALT 22 11/13/2012 1552   BILITOT 0.5 02/05/2014 1124   BILITOT 0.88 11/13/2012 1552     Lab Results  Component Value Date   ESRSEDRATE 3 12/29/2013     ASSESSMENT:  1. 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 She was then switched to dose adjusted BEACOPP x2 cycles. Her  PET scan after the second cycle showed complete remission. Because of probable inflammatory changes in the lungs and a prior Citrobacter blood stream/pulmonary infection felt to be the cause of the PET scan findings in the lungs she has had Levaquin which she is now finished. Now S/P involved field radiation by Dr. Thea Silversmith ([place]) finishing on 11/20/2012.  2. Suspected radiation recall, treated and resolved.  3. S/P cholecystectomy by Dr. Arnoldo Morale on 08/29/2013.  4. Fibromyalgia  5. Fatigue, improved with STAR program  Patient Active Problem List   Diagnosis Date Noted  . Leg edema 01/28/2014  . S/P cholecystectomy 09/14/2013  . Citrobacter infection 09/30/2012  . Hodgkin's lymphoma 03/20/2012  . Fibromyalgia 02/29/2012  . Eczema   . Hypertension   . Depression   . Restless leg      PLAN:  1. I personally reviewed and went over laboratory results with the patient.  The results are noted within this dictation. 2. I personally reviewed and went over radiographic studies with the patient.  The results are noted within this dictation.   3. Labs in 3 months and 6 months: CBC diff, CMET, ESR, LDH, B2M 4. MRI breast w wo contrast for breast cancer surveillance given her mediastinal radiation and per NCCN guidelines due in June 2016. 5. CT CAP with contrast in March 2016 6. Labs today:  CBC diff, CMET, LDH, B2M 7. Mammogram in December. 8. Order for Xray of orbits to rule out metal in orbits in preparation for breast MRI 9. Continue with STAR program 10. Return in 3 months for follow-up   THERAPY PLAN:  NCCN guidelines for surveillance of Hodgkin's Lymphoma (Stage I-IV) for first 5 years are as follows:  A. H+P every 3-6 months for years 1-2, then every 6-12 months until year 3, then annually.   B. Annual influenza vaccine.  C. Laboratory studies: CBC diff, ESR (if elevated at time of initial diagnosis), CMET.  TSH at least annually if RT to neck.  D. Chest x-ray of Ct every 6-12 months during first 2 years, then chest x-ray is optional.  E. Abdominal/pelvic CT every 6-12 months for first 2 years  F. Counseling: Reproduction, health habits, psychosocial, cardiovascular, breast self-exam, skin cancer risk, end-of treatment discussion.  G. Surveillance PET scan should not be done routinely due to risk for false positive.  Management decisions should not be based on PET scan alone; clinical or pathologic correlation is needed.   NCCN Guidelines for monitoring for late effects after 5 years are:  A. H+P annually  B. Annual blood pressure, aggressive management of cardiovascular risk factors  C. Pneumococcal, meningococcal, and H. Flue revaccination after 5-7 years, if patient treated with splenic RT of previous splenectomy  D. Annual influenza vaccine.  E. Cardiovascular symptoms may emerge at a young age   28. Consider stress test/echocardiogram at 10 year intervals after treatment is completed, especially if chest cardiac irradiation.   2. Consider carotid US at 10 year intervals if neck irradiation.  F. Lab studies: CBC diff, CMET annually and TSH at least annually if RT to neck and annual lipids.  G. Consider chest imaging for patients at increased risk for lung cancer.  H. Annual breast screening:  Initiate 8-10 years post-therapy, or at age 40, whichever comes first, if  chest or axillary radiation.  The NCCN Hodgkin Lymphoma Guideline s Panel recommends breast MRI in addition to mammography for women who received irradiation to the chest between ages 3 and 30 years, which  is consistent with the American Cancer Society Guidelines.  I. Counseling: reproduction, health habits, psychosocial, cardiovascular, breast self-exam, and skin cancer risk.  J. Treatment summary and consideration of transfer to PCP.  K. Consider a referral to a survivorship clinic.   All questions were answered. The patient knows to call the clinic with any problems, questions or concerns. We can certainly see the patient much sooner if necessary.  Patient and plan discussed with Dr. Farrel Gobble and he is in agreement with the aforementioned.   KEFALAS,THOMAS 03/31/2014

## 2014-03-31 ENCOUNTER — Ambulatory Visit (HOSPITAL_COMMUNITY)
Admission: RE | Admit: 2014-03-31 | Discharge: 2014-03-31 | Disposition: A | Discharge status: Home / Self Care | Payer: BC Managed Care – PPO | Source: Ambulatory Visit | Attending: Oncology | Admitting: Oncology

## 2014-03-31 ENCOUNTER — Encounter (HOSPITAL_COMMUNITY): Payer: Self-pay | Admitting: Oncology

## 2014-03-31 ENCOUNTER — Encounter (HOSPITAL_COMMUNITY): Payer: BC Managed Care – PPO | Attending: Oncology | Admitting: Oncology

## 2014-03-31 VITALS — BP 147/90 | HR 89 | Temp 98.3°F | Resp 16 | Wt 212.3 lb

## 2014-03-31 DIAGNOSIS — C8190 Hodgkin lymphoma, unspecified, unspecified site: Secondary | ICD-10-CM | POA: Diagnosis not present

## 2014-03-31 DIAGNOSIS — S0552XD Penetrating wound with foreign body of left eyeball, subsequent encounter: Secondary | ICD-10-CM

## 2014-03-31 DIAGNOSIS — C8118 Nodular sclerosis classical Hodgkin lymphoma, lymph nodes of multiple sites: Secondary | ICD-10-CM

## 2014-03-31 DIAGNOSIS — Z5189 Encounter for other specified aftercare: Secondary | ICD-10-CM | POA: Diagnosis not present

## 2014-03-31 LAB — CBC WITH DIFFERENTIAL/PLATELET
Basophils Absolute: 0 10*3/uL (ref 0.0–0.1)
Basophils Relative: 0 % (ref 0–1)
Eosinophils Absolute: 0.1 10*3/uL (ref 0.0–0.7)
Eosinophils Relative: 2 % (ref 0–5)
HCT: 39.6 % (ref 36.0–46.0)
Hemoglobin: 14 g/dL (ref 12.0–15.0)
Lymphocytes Relative: 14 % (ref 12–46)
Lymphs Abs: 0.7 10*3/uL (ref 0.7–4.0)
MCH: 32.7 pg (ref 26.0–34.0)
MCHC: 35.4 g/dL (ref 30.0–36.0)
MCV: 92.5 fL (ref 78.0–100.0)
Monocytes Absolute: 0.4 10*3/uL (ref 0.1–1.0)
Monocytes Relative: 7 % (ref 3–12)
Neutro Abs: 4 10*3/uL (ref 1.7–7.7)
Neutrophils Relative %: 77 % (ref 43–77)
Platelets: 246 10*3/uL (ref 150–400)
RBC: 4.28 MIL/uL (ref 3.87–5.11)
RDW: 12.3 % (ref 11.5–15.5)
WBC: 5.1 10*3/uL (ref 4.0–10.5)

## 2014-03-31 LAB — COMPREHENSIVE METABOLIC PANEL
ALT: 14 U/L (ref 0–35)
AST: 23 U/L (ref 0–37)
Albumin: 4.1 g/dL (ref 3.5–5.2)
Alkaline Phosphatase: 64 U/L (ref 39–117)
Anion gap: 16 — ABNORMAL HIGH (ref 5–15)
BUN: 24 mg/dL — ABNORMAL HIGH (ref 6–23)
CO2: 24 mEq/L (ref 19–32)
Calcium: 10 mg/dL (ref 8.4–10.5)
Chloride: 101 mEq/L (ref 96–112)
Creatinine, Ser: 0.73 mg/dL (ref 0.50–1.10)
GFR calc Af Amer: >90 mL/min (ref >90)
GFR calc non Af Amer: >90 mL/min (ref >90)
Glucose, Bld: 104 mg/dL — ABNORMAL HIGH (ref 70–99)
Potassium: 3.9 mEq/L (ref 3.7–5.3)
Sodium: 141 mEq/L (ref 137–147)
Total Bilirubin: 0.3 mg/dL (ref 0.3–1.2)
Total Protein: 7.8 g/dL (ref 6.0–8.3)

## 2014-03-31 LAB — LACTATE DEHYDROGENASE: LDH: 278 U/L — ABNORMAL HIGH (ref 94–250)

## 2014-03-31 NOTE — Progress Notes (Signed)
Physical Therapy Treatment Patient Details  Name: [name] [name] MRN: [id number] Date of Birth: August 30, 1970  Today's Date: 03/31/2014 Time: 1520-1558 PT Time Calculation (min): 38 min Charge: TE [id number]  Visit#: 4 of 24  Re-eval: 04/04/14    Authorization: [employer]  Authorization Time Period:    Authorization Visit#:   of     Subjective: Symptoms/Limitations Symptoms: Pt stated she has been tired lately, working 19 days straight 11 hour days with 2 hour drive there and back. Has not completed any of the notebook but has been using pedometer with average of 7,000 steps per day.  Did blood work today and remains in remission for 15 mojths now. Pain Assessment Currently in Pain?: No/denies  Objective:   Exercise/Treatments Stretches Active Hamstring Stretch: 3 reps;20 seconds Active Hamstring Stretch Limitations: 3 Way on 14" Step Passive Hamstring Stretch: 3 reps;30 seconds;Limitations Passive Hamstring Stretch Limitations: Press photographer on Slantboard Hip Flexor Stretch: 3 reps;20 seconds Hip Flexor Stretch Limitations: 14" Step Standing Extension: 5 reps;Limitations Standing Extension Limitations: flex and extend Quad Stretch: 3 reps;30 seconds;Limitations Quad Stretch Limitations: Prone with rope Piriformis Stretch: 3 reps;20 seconds Piriformis Stretch Limitations: 3 Way, seated Standing Functional Squats: Limitations;5 reps Functional Squats Limitations: 3D Squat Matrix with 3# weights;  2 squats and walk around box for IR 5 reps each side Other Standing Lumbar Exercises: 3D Hip Excursion (squats did with squat matrix) Other Standing Lumbar Exercises: Warrior pose I 1x 30", Warrior pose II 2 x30", Warrior pose III 1x 15" BIL Prone  Other Prone Lumbar Exercises: Held child's pose following reports of headache following activity    Physical Therapy Assessment and Plan PT Assessment and Plan Clinical Impression Statement: Reviewed use of STAR notebook and pedometer,  pt reorted she has been walking with pedometer but has beeen really busy with work and 2 hour driving.  Pt reported she has been having memory problems especially with word findings, spelling, and people recogition; discussion held with pt and SLP about referral for speech therapy for memory. Continued stretches for hip mobility and gluteal strengthening with squat matrix and began squat IR to improve hip mobilty as well.  Progressed to core strengthening with warrior poses, pt able to complet warrior pose I without difficutly, II and III very difficutly for pt.   PT Plan: Continue to ask STAR compliance.  Continue with current PT POC with primary focus on improving hip stability, progress abdominal strength to improve balance and trunk stability.   F/u on speech referral for memory.    Goals PT Short Term Goals PT Short Term Goal 1: Patient will be bale to internally rotate bilateral LE >30 degrees to improve deceleration mechnics of gait PT Short Term Goal 1 - Progress: Progressing toward goal PT Short Term Goal 2: Patient will be able to dorsiflex ankles >13 degrees bilaterally to increase stride length and decrease early heel off during gait.  PT Short Term Goal 2 - Progress: Progressing toward goal PT Short Term Goal 3: Patient will dmeosntrate improved glute strength bilaterally to 4-/5 so patient can perform the 5x sit to stand stest in <15 seconds indicating patient not at high risk for falls. PT Short Term Goal 3 - Progress: Progressing toward goal PT Short Term Goal 4: Patient will demosntrate increased abdominal muscle strength to 4/5 MMT indicatign increased trunk stability PT Short Term Goal 4 - Progress: Progressing toward goal PT Long Term Goals PT Long Term Goal 1: Patient will be able to ambualte up and down  stairs without hand hold asssistance to be able to carry things up stairs in home.   PT Long Term Goal 2: Patient will be able to perform single leg stand bilaterally >30 seconds  to indicate improved glut med strength of 4/5 MMT. PT Long Term Goal 2 - Progress: Progressing toward goal Long Term Goal 3: Patient will dmeosntrate increased trunk flexion strenght of 5/5 to demsontrate increased trunk stability. Long Term Goal 3 Progress: Progressing toward goal Long Term Goal 4: Patient will be able to lift 20lb from the floor and demosntrate hip extension strength of 4+/5 MMT so patient can perform normal ADL's. PT Long Term Goal 5: Patient will be able to walk >28mines with pain <2/10.  Problem List Patient Active Problem List   Diagnosis Date Noted  . Leg edema 01/28/2014  . S/P cholecystectomy 09/14/2013  . Citrobacter infection 09/30/2012  . Hodgkin's lymphoma 03/20/2012  . Fibromyalgia 02/29/2012  . Eczema   . Hypertension   . Depression   . Restless leg     PT - End of Session Activity Tolerance: Patient tolerated treatment well General Behavior During Therapy: [facility] for tasks assessed/performed  GP    Aldona Lento 03/31/2014, 4:17 PM

## 2014-03-31 NOTE — Patient Instructions (Signed)
[facility] Discharge Instructions  RECOMMENDATIONS MADE BY THE CONSULTANT AND ANY TEST RESULTS WILL BE SENT TO YOUR REFERRING PHYSICIAN.  EXAM FINDINGS BY THE PHYSICIAN TODAY AND SIGNS OR SYMPTOMS TO REPORT TO CLINIC OR PRIMARY PHYSICIAN: Exam and findings as discussed by Robynn Pane, PA-C.  Labs have been stable.  You seem to be doing better.  Will plan to do labs every 3 months and scans in 6 months. Report night sweats, fevers, any new lumps or other concerns.    INSTRUCTIONS/FOLLOW-UP: Labs and office visit in 3 months.  Thank you for choosing [facility] to provide your oncology and hematology care.  To afford each patient quality time with our providers, please arrive at least 15 minutes before your scheduled appointment time.  With your help, our goal is to use those 15 minutes to complete the necessary work-up to ensure our physicians have the information they need to help with your evaluation and healthcare recommendations.    Effective January 1st, 2014, we ask that you re-schedule your appointment with our physicians should you arrive 10 or more minutes late for your appointment.  We strive to give you quality time with our providers, and arriving late affects you and other patients whose appointments are after yours.    Again, thank you for choosing [facility].  Our hope is that these requests will decrease the amount of time that you wait before being seen by our physicians.       _____________________________________________________________  Should you have questions after your visit to [facility], please contact our office at [phone] between the hours of 8:30 a.m. and 4:30 p.m.  Voicemails left after 4:30 p.m. will not be returned until the following business day.  For prescription refill requests, have your pharmacy contact our office with your prescription refill request.     _______________________________________________________________  We hope that we have given you very good care.  You may receive a patient satisfaction survey in the mail, please complete it and return it as soon as possible.  We value your feedback!  _______________________________________________________________  Have you asked about our STAR program?  STAR stands for Survivorship Training and Rehabilitation, and this is a nationally recognized cancer care program that focuses on survivorship and rehabilitation.  Cancer and cancer treatments may cause problems, such as, pain, making you feel tired and keeping you from doing the things that you need or want to do. Cancer rehabilitation can help. Our goal is to reduce these troubling effects and help you have the best quality of life possible.  You may receive a survey from a nurse that asks questions about your current state of health.  Based on the survey results, all eligible patients will be referred to the [facility] program for an evaluation so we can better serve you!  A frequently asked questions sheet is available upon request.

## 2014-03-31 NOTE — Progress Notes (Signed)
[name] [name] presented for labwork. Labs per MD order drawn via Peripheral Line 23 gauge needle inserted in Left AC  Good blood return present. Procedure without incident.  Needle removed intact. Patient tolerated procedure well.

## 2014-04-02 ENCOUNTER — Telehealth (HOSPITAL_COMMUNITY): Payer: Self-pay

## 2014-04-02 ENCOUNTER — Ambulatory Visit (HOSPITAL_COMMUNITY)
Admission: RE | Admit: 2014-04-02 | Payer: BC Managed Care – PPO | Source: Ambulatory Visit | Admitting: Physical Therapy

## 2014-04-02 LAB — BETA 2 MICROGLOBULIN, SERUM: Beta-2 Microglobulin: 1.83 mg/L — ABNORMAL HIGH (ref <=2.51)

## 2014-04-02 NOTE — Telephone Encounter (Signed)
cx - sick

## 2014-04-07 ENCOUNTER — Ambulatory Visit (HOSPITAL_COMMUNITY)
Admission: RE | Admit: 2014-04-07 | Discharge: 2014-04-07 | Disposition: A | Discharge status: Home / Self Care | Payer: BC Managed Care – PPO | Source: Ambulatory Visit | Attending: Oncology | Admitting: Oncology

## 2014-04-07 DIAGNOSIS — Z5189 Encounter for other specified aftercare: Secondary | ICD-10-CM | POA: Diagnosis not present

## 2014-04-07 NOTE — Evaluation (Signed)
Physical Therapy Re-Evaluation  Patient Details  Name: [name] [name] MRN: [id number] Date of Birth: 1971-01-19  Today's Date: 04/07/2014 Time: 1194-1740 PT Time Calculation (min): 66 min   TE 1558-1610, MMT 8144-8185            Visit#: 5 of 24  Re-eval: 05/05/14 Assessment Diagnosis: weakness and pain s/p Hopgkins cancer Next MD Visit: Lowella Curb 06/2014  Authorization: Lorella Nimrod      Past Medical History:  Past Medical History  Diagnosis Date  . Fibromyalgia   . Eczema   . Depression     parents died close together "was hard"  . Restless leg   . Sleep apnea     Sleep Study- 1996- "little apnea- no tx"  . Headache(784.0)     Birth control pills help  . Hemorrhoid   . Anemia     As a child and while pregnant  . 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 Hypertension     recently dx with hodgkins  . Dyspnea     all the time .".due to the mass"  . History of radiation therapy 10/28/12-11/20/12    chest & neck,34Gy/77f  . S/P cholecystectomy 09/14/2013    08/29/2013 by Dr. JArnoldo Morale  Negative pathology   Past Surgical History:  Past Surgical History  Procedure Laterality Date  . Wisdom tooth extraction    . Dilation and curettage of uterus  1991  . Mediastinal mass biopsy  03/05/12  . Bone marrow biopsy  03/20/2012  . Portacath placement  03/26/2012    Procedure: INSERTION PORT-A-CATH;  Surgeon: SMelrose Nakayama MD;  [facility]  Service: Thoracic;  Laterality: N/A;  . Port-a-cath removal Left 10/16/2012    Procedure: REMOVAL PORT-A-CATH;  Surgeon: SMelrose Nakayama MD;  [facility] [facility]  Service: Thoracic;  Laterality: Left;  . Cholecystectomy N/A 08/29/2013    Procedure: LAPAROSCOPIC CHOLECYSTECTOMY;  Surgeon: MJamesetta So MD;  [facility];  Service: General;  Laterality: N/A;    Subjective Symptoms/Limitations Symptoms: Pt reports no complaints of pain today.  Since starting therapy, pt reports improvements in strength and  endurance by ~60%.   How long can you walk comfortably?: Pt reports she is able to amb while at work for 4 hours prior to a rest break at lunch, though cadence is decreased after ~20-30 minutes.  (was less than 15 minutes) Pain Assessment Currently in Pain?: No/denies  Cognition/Observation Observation/Other Assessments Observations: FACIT-F 67, FACT-G 78, FACIT-F 108 (was 32, 65, 74) Other Assessments: VAS Fatigue 6, Pain 2.66, Distress 3  Sensation/Coordination/Flexibility/Functional Tests Functional Tests Functional Tests: TUG : 6.35 Functional Tests: 5x Sit to Stand 12.32 Functional Tests: EPat PatrickLt to glute, Rt 2 in to glute  Assessment RLE AROM (degrees) Right Ankle Dorsiflexion: 7 ((was 5)) RLE Strength Right Hip Flexion:  (4-/5 (was 3+/5)) Right Hip Extension: 4/5 ((was 3+/5)) Right Hip External Rotation : 62 ((was 45)) Right Hip Internal Rotation : 25 ((was 19)) Right Hip ABduction:  (4-/5 (was 3+/5)) Right Knee Flexion:  (4+/5 (was 4-/5)) Right Knee Extension:  (4+/5 (was 4/5)) Right Ankle Dorsiflexion:  (4+/5 (was 4/5)) LLE AROM (degrees) Left Hip External Rotation : 60 ((was 40)) Left Hip Internal Rotation : 27 ((was 27)) Left Ankle Dorsiflexion: 7 ((was 5)) LLE Strength Left Hip Flexion: 4/5 ((was 3+/5)) Left Hip Extension:  (4-/5 (was 4/5)) Left Hip ABduction:  (4-/5 (was 3+/5)) Left Knee Flexion:  (4+/5 (was 4-/5)) Left Knee Extension:  (  4+/5 (was 4/5)) Left Ankle Dorsiflexion:  (4+/5 (was 4/5)) Lumbar Strength Lumbar Flexion: 3/5 ((was 2+/5)) Lumbar Extension: 3/5 ((was 2+/5))  Exercise/Treatments Mobility/Balance  Static Standing Balance Single Leg Stance - Right Leg: 43 Single Leg Stance - Left Leg: 9.58     04/07/14 1700  Lumbar Exercises: Stretches  Active Hamstring Stretch 3 reps;20 seconds  Active Hamstring Stretch Limitations 3 Way on 14" Step  Passive Hamstring Stretch 3 reps;30 seconds;Limitations  Passive Hamstring Stretch Limitations  Gastroc Stretch on Slantboard  Standing Extension 5 reps  Quad Stretch 1 rep;30 seconds;Limitations  Quad Stretch Limitations Prone with rope  Piriformis Stretch 3 reps;20 seconds  Piriformis Stretch Limitations 3 Way, seated   Physical Therapy Assessment and Plan PT Assessment and Plan Clinical Impression Statement: Re-eval performed.  Pt demonstrates improvements in all areas, though limitations from goals continue particularly in strength of (B) hips and endurance.  Functionally, noted improvements in stair climbing, sit <-> stand x5, and TUG scores significantly from initial evaluation.  Pt reports she feels improved by ~60% since inital evaluation.  Currently, pt has met 1/4 STG, and 1/5 LTG; goals revised as needed to increase or changes goals to be achievable.  Recommend continued PT 2x/wk for 8 weeks to address remaining goals.  Pt will benefit from skilled therapeutic intervention in order to improve on the following deficits: Abnormal gait;Decreased activity tolerance;Decreased balance;Difficulty walking;Pain;Decreased strength;Impaired sensation;Impaired flexibility;Improper body mechanics;Increased fascial restricitons;Decreased mobility;Decreased range of motion;Increased edema Rehab Potential: Good Clinical Impairments Affecting Rehab Potential: highly motivated PT Frequency: Min 2X/week PT Duration: 8 weeks PT Treatment/Interventions: Gait training;Stair training;Functional mobility training;Therapeutic exercise;Therapeutic activities;Balance training;Neuromuscular re-education;Patient/family education;Manual techniques;Modalities PT Plan: Progress POC, focusing on strengthening of (B) hips and increasing endurance in functional activities.     Goals PT Short Term Goals PT Short Term Goal 1: Patient will be able to internally rotate bilateral LE >30 degrees to improve deceleration mechanics of gait PT Short Term Goal 1 - Progress: Progressing toward goal PT Short Term Goal 2:  Goal Revised 04/07/14: Patient will be able to dorsiflex ankles >10 degrees bilaterally to increase stride length and decrease early heel off during gait.  PT Short Term Goal 2 - Progress: Revised (modified due to lack of progress/goal met) PT Short Term Goal 3: Patient will denosntrate improved glute strength bilaterally to 4-/5 so patient can perform the 5x sit to stand stest in <15 seconds indicating patient not at high risk for falls. PT Short Term Goal 3 - Progress: Met PT Short Term Goal 4: Patient will demosntrate increased abdominal muscle strength to 4/5 MMT indicatign increased trunk stability PT Short Term Goal 4 - Progress: Progressing toward goal PT Long Term Goals PT Long Term Goal 1: Patient will be able to ambualte up and down stairs without hand hold asssistance to be able to carry things up stairs in home.   PT Long Term Goal 1 - Progress: Met PT Long Term Goal 2: Patient will be able to perform single leg stand bilaterally >30 seconds to indicate improved glut med strength of 4/5 MMT. (Rt 43 seconds, lt 9.58 seconds) PT Long Term Goal 2 - Progress: Progressing toward goal Long Term Goal 3: Patient will demonstrate increased trunk flexion strenght of 5/5 to demsontrate increased trunk stability. Long Term Goal 3 Progress: Progressing toward goal Long Term Goal 4: Patient will be able to lift 20lb from the floor and demosntrate hip extension strength of 4+/5 MMT so patient can perform normal ADL's. Long Term Goal 4  Progress: Progressing toward goal PT Long Term Goal 5: Revised 04/07/14 : Patient will be able to walk >45 minutes with pain <2/10 and without decreasing cadence to perform work duties.  Long Term Goal 5 Progress: Revised (modified due to lack of progress/goal met)  Problem List Patient Active Problem List   Diagnosis Date Noted  . Leg edema 01/28/2014  . S/P cholecystectomy 09/14/2013  . Citrobacter infection 09/30/2012  . Hodgkin's lymphoma 03/20/2012  .  Fibromyalgia 02/29/2012  . Eczema   . Hypertension   . Depression   . Restless leg     PT - End of Session Activity Tolerance: Patient tolerated treatment well   Quinn Quam 04/07/2014, 5:44 PM  Physician Documentation Your signature is required to indicate approval of the treatment plan as stated above.  Please sign and either send electronically or make a copy of this report for your files and return this physician signed original.   Please mark one 1.__approve of plan  2. ___approve of plan with the following conditions.   ______________________________                                                          _____________________ Physician Signature                                                                                                             Date

## 2014-04-09 ENCOUNTER — Ambulatory Visit (HOSPITAL_COMMUNITY)
Admission: RE | Admit: 2014-04-09 | Discharge: 2014-04-09 | Disposition: A | Discharge status: Home / Self Care | Payer: BC Managed Care – PPO | Source: Ambulatory Visit | Attending: Internal Medicine | Admitting: Internal Medicine

## 2014-04-09 DIAGNOSIS — Z5189 Encounter for other specified aftercare: Secondary | ICD-10-CM | POA: Diagnosis not present

## 2014-04-09 NOTE — Progress Notes (Signed)
Physical Therapy Treatment Patient Details  Name: [name] [name] MRN: [id number] Date of Birth: 1970-08-13  Today's Date: 04/09/2014 Time: 7846-9629 PT Time Calculation (min): 43 min    Charges: TE 5284-1324 Visit#: 6 of 24  Re-eval: 05/05/14 Assessment Diagnosis: weakness and pain s/p Hopgkins cancer Next MD Visit: Lowella Curb 06/2014  Authorization: [employer]   Subjective: Symptoms/Limitations Symptoms: Pt reports no complaints of pain today.  Patient notes continued fatigue but gets 6 to 7 thousand steps a day Pain Assessment Currently in Pain?: No/denies  Exercise/Treatments Stretches Active Hamstring Stretch: 3 reps;20 seconds Active Hamstring Stretch Limitations: 3 Way on 14" Step Passive Hamstring Stretch: 3 reps;30 seconds;Limitations Passive Hamstring Stretch Limitations: Press photographer on Slantboard Standing Extension: 5 reps Piriformis Stretch: 3 reps;20 seconds Piriformis Stretch Limitations: 3 Way, seated Standing Functional Squats Limitations: Squat Reach matrix with 3lb dumbbell to a chair for depth cuing 5x, Squat walk aroun 12" box Forward Lunge: 10 reps;Limitations Forward Lunge Limitations: to floor Side Lunge: 10 reps;Limitations Side Lunge Limitations: floor Other Standing Lumbar Exercises: 12" 3D step up 10x Anterior and posterior single leg balance reaches 10x Quadruped Other Quadruped Lumbar Exercises: UE and LE ground matrix 5x each at raised table height 5x  Physical Therapy Assessment and Plan PT Assessment and Plan Clinical Impression Statement: Exercises progressed this session to improve depth of loading and single leg strengthening to improve balance. noted diffiuclty during attempt of single leg toe touch squat reach matrix indicatign patient not ready for that progression. Patient required one UE finger tip assist for anterior and posterior single leg balance reaches.  PT Plan: Progress strengthening of LE and Tunk stabilizers. Next session  measure height of UE and LE ground matrix and lower 1 to 2" each sub sequent visit to progress strength and stabiliity of abdominal muscles.     Goals PT Short Term Goals PT Short Term Goal 1: Patient will be able to internally rotate bilateral LE >30 degrees to improve deceleration mechanics of gait PT Short Term Goal 1 - Progress: Progressing toward goal PT Short Term Goal 2: Goal Revised 04/07/14: Patient will be able to dorsiflex ankles >10 degrees bilaterally to increase stride length and decrease early heel off during gait.  PT Short Term Goal 2 - Progress: Progressing toward goal PT Short Term Goal 4: Patient will demosntrate increased abdominal muscle strength to 4/5 MMT indicatign increased trunk stability PT Short Term Goal 4 - Progress: Progressing toward goal PT Long Term Goals PT Long Term Goal 2: Patient will be able to perform single leg stand bilaterally >30 seconds to indicate improved glut med strength of 4/5 MMT. PT Long Term Goal 2 - Progress: Progressing toward goal Long Term Goal 3: Patient will demonstrate increased trunk flexion strenght of 5/5 to demsontrate increased trunk stability. Long Term Goal 3 Progress: Progressing toward goal Long Term Goal 4: Patient will be able to lift 20lb from the floor and demosntrate hip extension strength of 4+/5 MMT so patient can perform normal ADL's. Long Term Goal 4 Progress: Progressing toward goal PT Long Term Goal 5: Revised 04/07/14 : Patient will be able to walk >45 minutes with pain <2/10 and without decreasing cadence to perform work duties.  Long Term Goal 5 Progress: Progressing toward goal  Problem List Patient Active Problem List   Diagnosis Date Noted  . Leg edema 01/28/2014  . S/P cholecystectomy 09/14/2013  . Citrobacter infection 09/30/2012  . Hodgkin's lymphoma 03/20/2012  . Fibromyalgia 02/29/2012  . Eczema   .  Hypertension   . Depression   . Restless leg     PT - End of Session Activity Tolerance:  Patient tolerated treatment well General Behavior During Therapy: [facility] for tasks assessed/performed  GP    Reo Portela R 04/09/2014, 6:06 PM

## 2014-04-28 ENCOUNTER — Ambulatory Visit (HOSPITAL_COMMUNITY)
Admission: RE | Admit: 2014-04-28 | Discharge: 2014-04-28 | Disposition: A | Discharge status: Home / Self Care | Payer: BC Managed Care – PPO | Source: Ambulatory Visit | Attending: Oncology | Admitting: Oncology

## 2014-04-28 DIAGNOSIS — M79605 Pain in left leg: Secondary | ICD-10-CM | POA: Diagnosis not present

## 2014-04-28 DIAGNOSIS — M79604 Pain in right leg: Secondary | ICD-10-CM | POA: Diagnosis not present

## 2014-04-28 DIAGNOSIS — M545 Low back pain: Secondary | ICD-10-CM | POA: Insufficient documentation

## 2014-04-28 DIAGNOSIS — R262 Difficulty in walking, not elsewhere classified: Secondary | ICD-10-CM | POA: Insufficient documentation

## 2014-04-28 DIAGNOSIS — M6281 Muscle weakness (generalized): Secondary | ICD-10-CM | POA: Diagnosis not present

## 2014-04-28 DIAGNOSIS — Z5189 Encounter for other specified aftercare: Secondary | ICD-10-CM | POA: Diagnosis present

## 2014-04-28 DIAGNOSIS — R531 Weakness: Secondary | ICD-10-CM

## 2014-04-28 DIAGNOSIS — R269 Unspecified abnormalities of gait and mobility: Secondary | ICD-10-CM

## 2014-04-28 DIAGNOSIS — I10 Essential (primary) hypertension: Secondary | ICD-10-CM | POA: Diagnosis not present

## 2014-04-28 NOTE — Therapy (Signed)
Physical Therapy Treatment  Patient Details  Name: [name] [name] MRN: [id number] Date of Birth: 1971-01-24  Encounter Date: 04/28/2014      PT End of Session - 04/28/14 1727    Visit Number 7   Number of Visits 24   Date for PT Re-Evaluation 05/05/14   PT Start Time 1645   PT Stop Time 6222   PT Time Calculation (min) 30 min   Activity Tolerance Patient limited by fatigue   Behavior During Therapy [facility] for tasks assessed/performed      Past Medical History  Diagnosis Date  . Fibromyalgia   . Eczema   . Depression     parents died close together "was hard"  . Restless leg   . Sleep apnea     Sleep Study- 1996- "little apnea- no tx"  . Headache(784.0)     Birth control pills help  . Hemorrhoid   . Anemia     As a child and while pregnant  . 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 Hypertension     recently dx with hodgkins  . Dyspnea     all the time .".due to the mass"  . History of radiation therapy 10/28/12-11/20/12    chest & neck,34Gy/10f  . S/P cholecystectomy 09/14/2013    08/29/2013 by Dr. JArnoldo Morale  Negative pathology    Past Surgical History  Procedure Laterality Date  . Wisdom tooth extraction    . Dilation and curettage of uterus  1991  . Mediastinal mass biopsy  03/05/12  . Bone marrow biopsy  03/20/2012  . Portacath placement  03/26/2012    Procedure: INSERTION PORT-A-CATH;  Surgeon: SMelrose Nakayama MD;  [place]  Service: Thoracic;  Laterality: N/A;  . Port-a-cath removal Left 10/16/2012    Procedure: REMOVAL PORT-A-CATH;  Surgeon: SMelrose Nakayama MD;  [place]  Service: Thoracic;  Laterality: Left;  . Cholecystectomy N/A 08/29/2013    Procedure: LAPAROSCOPIC CHOLECYSTECTOMY;  Surgeon: MJamesetta So MD;  Location: [facility];  Service: General;  Laterality: N/A;    There were no vitals taken for this visit.  Visit Diagnosis:  Abnormality of gait  Weakness generalized      Subjective  Assessment - 04/28/14 1658    Symptoms No complaints of pain, just generalized fatigue.           [facility] - 04/28/14 1648    Assessment   Medical Diagnosis weakness/pain secondary to Hodgkins CA   Next MD Visit KLowella Curb1/2016          [facility] - 04/28/14 1646    Exercises   Exercises Knee/Hip   Knee/Hip Exercises: Stretches   Active Hamstring Stretch 3 reps;20 seconds   Active Hamstring Stretch Limitations 3 Way 14" Box   Piriformis Stretch 3 reps;20 seconds   Piriformis Stretch Limitations 3 Way, seated   Gastroc Stretch 3 reps;30 seconds   Gastroc Stretch Limitations Slantboard   Knee/Hip Exercises: Standing   Forward Lunges 10 reps;Both   Side Lunges 10 reps;Both   Other Standing Knee Exercises Squat Reach Matrix 3lb, x5            PT Short Term Goals - 04/28/14 1733    PT SHORT TERM GOAL #1   Title Patient will be able to internally rotate bilateral LE >30 degrees to improve deceleration mechanics of gait   Time 4   Period Weeks   Status On-going  PT SHORT TERM GOAL #2   Title Goal Revised 04/07/14: Patient will be able to dorsiflex ankles >10 degrees bilaterally to increase stride length and decrease early heel off during gait.   Time 4   Status On-going   PT SHORT TERM GOAL #3   Title Patient will demosntrate increased abdominal muscle strength to 4/5 MMT indicatign increased trunk stability   Time 4   Status On-going          PT Long Term Goals - 04/28/14 1734    PT LONG TERM GOAL #1   Title Patient will be able to perform single leg stand bilaterally >30 seconds to indicate improved glut med strength of 4/5 MMT   Time 8   Period Weeks   Status On-going   PT LONG TERM GOAL #2   Title Patient will demonstrate increased trunk flexion strenght of 5/5 to demsontrate increased trunk stability.   Time 8   Period Weeks   Status On-going   PT LONG TERM GOAL #3   Title Patient will be able to lift 20lb from the  floor and demosntrate hip extension strength of 4+/5 MMT so patient can perform normal ADL's.   Time 8   Period Weeks   Status On-going   PT LONG TERM GOAL #4   Title Patient will be able to walk >45 minutes with pain <2/10 and without decreasing cadence to perform work duties.    Time 8   Period Weeks   Status On-going          Plan - 04/28/14 1728    Clinical Impression Statement Pt reported increased complaints of fatigue today; pt reports she has "on" and "off" weeks with generalized fatigue secondary to CA treatment.  Pt was able to complete 30 minutes of PT treatment session prior to increased complaints of fatigue, and pt inability to continue with treatment session.  Pt reports she has not even been able to complete elliptical at home secondary to fatigue.       Pt will benefit from skilled therapeutic intervention in order to improve on the following deficits Decreased activity tolerance;Decreased mobility;Decreased strength;Impaired flexibility   PT Treatment/Interventions Neuromuscular re-education;Functional mobility training;Therapeutic activities;Therapeutic exercise   PT Next Visit Plan Continue with therapeutic exercise program, adding back squat walk around, 12" 3D step up, anterior/posterio single leg balance reach, and UE/LE ground matrix exercises for strengthening program.         Problem List Patient Active Problem List   Diagnosis Date Noted  . Leg edema 01/28/2014  . S/P cholecystectomy 09/14/2013  . Citrobacter infection 09/30/2012  . Hodgkin's lymphoma 03/20/2012  . Fibromyalgia 02/29/2012  . Eczema   . Hypertension   . Depression   . Restless leg           [name] [name] 04/28/2014, 5:35 PM

## 2014-04-30 ENCOUNTER — Ambulatory Visit (HOSPITAL_COMMUNITY)
Admission: RE | Admit: 2014-04-30 | Discharge: 2014-04-30 | Disposition: A | Discharge status: Home / Self Care | Payer: BC Managed Care – PPO | Source: Ambulatory Visit | Attending: Internal Medicine | Admitting: Internal Medicine

## 2014-04-30 DIAGNOSIS — R531 Weakness: Secondary | ICD-10-CM

## 2014-04-30 DIAGNOSIS — R269 Unspecified abnormalities of gait and mobility: Secondary | ICD-10-CM

## 2014-04-30 DIAGNOSIS — Z5189 Encounter for other specified aftercare: Secondary | ICD-10-CM | POA: Diagnosis not present

## 2014-04-30 NOTE — Therapy (Signed)
Physical Therapy Treatment  Patient Details  Name: [name] [name] MRN: [id number] Date of Birth: 1970/12/17  Encounter Date: 04/30/2014      PT End of Session - 04/30/14 1730    Visit Number 8   Number of Visits 24   Date for PT Re-Evaluation 05/05/14   PT Start Time 1647   PT Stop Time 1728   PT Time Calculation (min) 41 min   Activity Tolerance Patient tolerated treatment well   Behavior During Therapy [facility] for tasks assessed/performed      Past Medical History  Diagnosis Date  . Fibromyalgia   . Eczema   . Depression     parents died close together "was hard"  . Restless leg   . Sleep apnea     Sleep Study- 1996- "little apnea- no tx"  . Headache(784.0)     Birth control pills help  . Hemorrhoid   . Anemia     As a child and while pregnant  . 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 Hypertension     recently dx with hodgkins  . Dyspnea     all the time .".due to the mass"  . History of radiation therapy 10/28/12-11/20/12    chest & neck,34Gy/43f  . S/P cholecystectomy 09/14/2013    08/29/2013 by Dr. JArnoldo Morale  Negative pathology    Past Surgical History  Procedure Laterality Date  . Wisdom tooth extraction    . Dilation and curettage of uterus  1991  . Mediastinal mass biopsy  03/05/12  . Bone marrow biopsy  03/20/2012  . Portacath placement  03/26/2012    Procedure: INSERTION PORT-A-CATH;  Surgeon: SMelrose Nakayama MD;  [place]  Service: Thoracic;  Laterality: N/A;  . Port-a-cath removal Left 10/16/2012    Procedure: REMOVAL PORT-A-CATH;  Surgeon: SMelrose Nakayama MD;  [place]  Service: Thoracic;  Laterality: Left;  . Cholecystectomy N/A 08/29/2013    Procedure: LAPAROSCOPIC CHOLECYSTECTOMY;  Surgeon: MJamesetta So MD;  [facility];  Service: General;  Laterality: N/A;    There were no vitals taken for this visit.  Visit Diagnosis:  Abnormality of gait  Weakness generalized      Subjective  Assessment - 04/30/14 1729    Symptoms Pt reports she is feeling better today, not 100% though better than last visit.           [facility] - 04/30/14 1729    Assessment   Medical Diagnosis weakness/pain secondary to Hodgkins CA   Next MD Visit KLowella Curb1/2016          [facility] - 04/30/14 1652    Exercises   Exercises Knee/Hip   Lumbar Exercises: Stretches   Active Hamstring Stretch 3 reps;20 seconds   Active Hamstring Stretch Limitations 3 Way on 14" Step   Passive Hamstring Stretch 3 reps;30 seconds;Limitations   Passive Hamstring Stretch Limitations Gastroc Stretch on Slantboard   Piriformis Stretch 3 reps;20 seconds   Piriformis Stretch Limitations 3 Way, seated   Lumbar Exercises: Standing   Functional Squats Limitations Squat Reach matrix with 3lb dumbbell to a chair for depth cuing x7, Squat walk around 12" box x5   Other Standing Lumbar Exercises 12" 3D step up 10x   Other Standing Lumbar Exercises Ant/Post Single Leg Balance Reach x10            PT Short Term Goals - 04/30/14 1733    PT  SHORT TERM GOAL #1   Title Patient will be able to internally rotate bilateral LE >30 degrees to improve deceleration mechanics of gait   Status On-going   PT SHORT TERM GOAL #2   Title Goal Revised 04/07/14: Patient will be able to dorsiflex ankles >10 degrees bilaterally to increase stride length and decrease early heel off during gait.   Status On-going   PT SHORT TERM GOAL #3   Title Patient will demosntrate increased abdominal muscle strength to 4/5 MMT indicatign increased trunk stability   Status On-going          PT Long Term Goals - 04/30/14 1734    PT LONG TERM GOAL #1   Title Patient will be able to perform single leg stand bilaterally >30 seconds to indicate improved glut med strength of 4/5 MMT   Status On-going   PT LONG TERM GOAL #2   Title Patient will demonstrate increased trunk flexion strenght of 5/5 to demsontrate  increased trunk stability.   Status On-going   PT LONG TERM GOAL #3   Title Patient will be able to lift 20lb from the floor and demosntrate hip extension strength of 4+/5 MMT so patient can perform normal ADL's.   Status On-going   PT LONG TERM GOAL #4   Title Patient will be able to walk >45 minutes with pain <2/10 and without decreasing cadence to perform work duties.    Status On-going          Plan - 04/30/14 1731    Clinical Impression Statement Pt reports feeling better this session, and able to tolerate full treatment session today.  Stretches and strengthening exercises were alternated today to allow for some rest breaks with stretches.  Progressed reps of squat matrix today for increased lower body strengthening.  Noted improved balance on Lt > Rt LE during single leg balance exercises, though pt did require continuous VC throughout exercise to decrease reliance on UE for balance.     PT Next Visit Plan Re-eval next visit.  Re-add UE and/or LE ground matrix next visit as tolerated.         Problem List Patient Active Problem List   Diagnosis Date Noted  . Leg edema 01/28/2014  . S/P cholecystectomy 09/14/2013  . Citrobacter infection 09/30/2012  . Hodgkin's lymphoma 03/20/2012  . Fibromyalgia 02/29/2012  . Eczema   . Hypertension   . Depression   . Restless leg       Lonna Cobb, DPT [phone]

## 2014-05-12 ENCOUNTER — Ambulatory Visit (HOSPITAL_COMMUNITY)
Admission: RE | Admit: 2014-05-12 | Discharge: 2014-05-12 | Disposition: A | Discharge status: Home / Self Care | Payer: BC Managed Care – PPO | Source: Ambulatory Visit | Attending: Oncology | Admitting: Oncology

## 2014-05-12 DIAGNOSIS — R262 Difficulty in walking, not elsewhere classified: Secondary | ICD-10-CM | POA: Diagnosis not present

## 2014-05-12 DIAGNOSIS — R531 Weakness: Secondary | ICD-10-CM

## 2014-05-12 DIAGNOSIS — M6281 Muscle weakness (generalized): Secondary | ICD-10-CM | POA: Insufficient documentation

## 2014-05-12 DIAGNOSIS — Z5189 Encounter for other specified aftercare: Secondary | ICD-10-CM | POA: Diagnosis not present

## 2014-05-12 DIAGNOSIS — M79604 Pain in right leg: Secondary | ICD-10-CM | POA: Diagnosis not present

## 2014-05-12 DIAGNOSIS — M79605 Pain in left leg: Secondary | ICD-10-CM | POA: Diagnosis not present

## 2014-05-12 DIAGNOSIS — M545 Low back pain: Secondary | ICD-10-CM | POA: Diagnosis not present

## 2014-05-12 DIAGNOSIS — R269 Unspecified abnormalities of gait and mobility: Secondary | ICD-10-CM

## 2014-05-12 DIAGNOSIS — I10 Essential (primary) hypertension: Secondary | ICD-10-CM | POA: Insufficient documentation

## 2014-05-12 NOTE — Therapy (Signed)
[facility] [address] [place], [place], [address] Phone: [phone]   Fax:  [phone]  Physical Therapy Re-evaluation  Patient Details  Name: [name] [name] MRN: [id number] Date of Birth: April 08, 1971  Encounter Date: 05/12/2014      PT End of Session - 05/12/14 1733    Visit Number 9   Number of Visits 24   Date for PT Re-Evaluation 05/05/14   PT Start Time 1620   PT Stop Time 1700   PT Time Calculation (min) 40 min   Activity Tolerance Patient tolerated treatment well   Behavior During Therapy [facility] for tasks assessed/performed      Past Medical History  Diagnosis Date  . Fibromyalgia   . Eczema   . Depression     parents died close together "was hard"  . Restless leg   . Sleep apnea     Sleep Study- 1996- "little apnea- no tx"  . Headache(784.0)     Birth control pills help  . Hemorrhoid   . Anemia     As a child and while pregnant  . 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 Hypertension     recently dx with hodgkins  . Dyspnea     all the time .".due to the mass"  . History of radiation therapy 10/28/12-11/20/12    chest & neck,34Gy/70f  . S/P cholecystectomy 09/14/2013    08/29/2013 by Dr. JArnoldo Morale  Negative pathology    Past Surgical History  Procedure Laterality Date  . Wisdom tooth extraction    . Dilation and curettage of uterus  1991  . Mediastinal mass biopsy  03/05/12  . Bone marrow biopsy  03/20/2012  . Portacath placement  03/26/2012    Procedure: INSERTION PORT-A-CATH;  Surgeon: SMelrose Nakayama MD;  [facility]  Service: Thoracic;  Laterality: N/A;  . Port-a-cath removal Left 10/16/2012    Procedure: REMOVAL PORT-A-CATH;  Surgeon: SMelrose Nakayama MD;  [facility]: [facility]  Service: Thoracic;  Laterality: Left;  . Cholecystectomy N/A 08/29/2013    Procedure: LAPAROSCOPIC CHOLECYSTECTOMY;  Surgeon: MJamesetta So MD;  Location: [facility];  Service: General;  Laterality: N/A;     There were no vitals taken for this visit.  Visit Diagnosis:  Abnormality of gait  Weakness generalized      Subjective Assessment - 05/12/14 1732    Symptoms Pt states she is doing much better overall, biggest hurdle is her fatigue/energy.  States she is still having some nerve issues and her vision is still not normal.   Currently in Pain? No/denies          [facility] 1658    Assessment   Medical Diagnosis weakness/pain secondary to Hodgkins CA   [facility]   Single Leg Stance   Comments bilateralally 1' without UE assist    Sit to Stand   Comments 5 STS in 10.63 (was 12.32)   Other:   Other/ Comments TUG: 6.10 (was 6.35)    Other:   Other/Comments FACIT-F 69 (was 67), FACIT-G 88 (was 78), FACIT-F 114 (was 108)  VAS fatigue 3.6 (was 6), pain 1.3 (2.66), distress 1.3 (3)    AROM   Left Hip External Rotation  60  was 60   Left Hip Internal Rotation  30  was 27   Right Ankle Dorsiflexion 10  was 7   Left Ankle Dorsiflexion 10  was 7  Strength   Right Hip Flexion 5/5  was 4-/5   Right Hip Extension 5/5  was 4/5   Right Hip External Rotation  62  was 62   Right Hip Internal Rotation  28  was 25   Right Hip ABduction 5/5  was 4-/5   Left Hip Flexion 5/5  was 4/5   Left Hip Extension 4/5  was 4-/5   Left Hip ABduction 5/5  was 4-/5   Right Knee Flexion 5/5  was 4+/5   Right Knee Extension 5/5  was 4+/5   Left Knee Flexion 5/5  was 4+/5   Left Knee Extension 5/5  was 4+/5   Right Ankle Dorsiflexion 5/5  was 4+/5   Left Ankle Dorsiflexion 5/5  was 4+/5   Lumbar Flexion 3+/5  was 3/5   Lumbar Extension 4/5  was 3/5              PT Short Term Goals - 05/12/14 1643    PT SHORT TERM GOAL #1   Title Patient will be able to internally rotate bilateral LE >30 degrees to improve deceleration mechanics of gait   Time 4   Period Weeks   Status Achieved   PT SHORT TERM GOAL #2   Title Goal Revised  04/07/14: Patient will be able to dorsiflex ankles >10 degrees bilaterally to increase stride length and decrease early heel off during gait.   Time 4   Period Weeks   Status Achieved   PT SHORT TERM GOAL #3   Title Patient will demosntrate increased abdominal muscle strength to 4/5 MMT indicatign increased trunk stability   Status On-going          PT Long Term Goals - 05/12/14 1645    PT LONG TERM GOAL #1   Title Patient will be able to perform single leg stand bilaterally >30 seconds to indicate improved glut med strength of 4/5 MMT   Time 8   Period Weeks   Status Achieved   PT LONG TERM GOAL #2   Title Patient will demonstrate increased trunk flexion strenght of 5/5 to demsontrate increased trunk stability.   Time 8   Period Weeks   Status On-going   PT LONG TERM GOAL #3   Title Patient will be able to lift 20lb from the floor and demosntrate hip extension strength of 4+/5 MMT so patient can perform normal ADL's.   Time 8   Period Weeks   Status Achieved   PT LONG TERM GOAL #4   Title Patient will be able to walk >45 minutes with pain <2/10 and without decreasing cadence to perform work duties.    Time 8   Period Weeks   Status Achieved          Plan - 05/12/14 1733    Clinical Impression Statement Pt has progressed well with therapy.  She has met all goals besides strength for abdominal/lumbar musculature.  She continues to work full time, is compliant with HEP and is progressing her activity at home.  Overall, her fatigue is improving but remains her major issue.  All scores on FACIT and VAS questionnaires have improved as well as all other objective measures.  Pt is agreeable to discharge at this time.  Pt was given [employer].   PT Next Visit Plan Recommend discharge from [facility] program at this time as 95% of goals have been met and patient is independent with HEP and self care.  Problem List Patient Active Problem List   Diagnosis Date  Noted  . Leg edema 01/28/2014  . S/P cholecystectomy 09/14/2013  . Citrobacter infection 09/30/2012  . Hodgkin's lymphoma 03/20/2012  . Fibromyalgia 02/29/2012  . Eczema   . Hypertension   . Depression   . Restless leg     Teena Irani, PTA/CLT [phone] 05/12/2014, 5:38 PM      PHYSICAL THERAPY DISCHARGE SUMMARY  Visits from Start of Care: 9   Remaining deficits: None noted    Plan: Patient agrees to discharge.  Patient goals were met. Patient is being discharged due to meeting the stated rehab goals.  ?????        Devona Konig PT DPT [phone]

## 2014-05-12 NOTE — Addendum Note (Signed)
Encounter addended by: Leia Alf, PT on: 05/12/2014  7:08 PM<BR>     Documentation filed: Episodes

## 2014-05-14 ENCOUNTER — Ambulatory Visit (HOSPITAL_COMMUNITY): Payer: BC Managed Care – PPO | Admitting: Physical Therapy

## 2014-05-15 ENCOUNTER — Ambulatory Visit (HOSPITAL_COMMUNITY): Payer: BC Managed Care – PPO

## 2014-05-19 ENCOUNTER — Ambulatory Visit (HOSPITAL_COMMUNITY): Payer: BC Managed Care – PPO | Admitting: Physical Therapy

## 2014-05-19 ENCOUNTER — Other Ambulatory Visit (HOSPITAL_COMMUNITY): Payer: BC Managed Care – PPO

## 2014-05-21 ENCOUNTER — Ambulatory Visit (HOSPITAL_COMMUNITY): Payer: BC Managed Care – PPO | Admitting: Physical Therapy

## 2014-05-26 ENCOUNTER — Ambulatory Visit (HOSPITAL_COMMUNITY): Payer: BC Managed Care – PPO | Admitting: Physical Therapy

## 2014-05-28 ENCOUNTER — Ambulatory Visit (HOSPITAL_COMMUNITY): Payer: BC Managed Care – PPO

## 2014-06-02 ENCOUNTER — Ambulatory Visit (HOSPITAL_COMMUNITY): Payer: BC Managed Care – PPO | Admitting: Physical Therapy

## 2014-06-04 ENCOUNTER — Ambulatory Visit (HOSPITAL_COMMUNITY): Payer: BC Managed Care – PPO

## 2014-06-09 ENCOUNTER — Ambulatory Visit (HOSPITAL_COMMUNITY): Payer: BC Managed Care – PPO

## 2014-06-11 ENCOUNTER — Ambulatory Visit (HOSPITAL_COMMUNITY): Payer: BC Managed Care – PPO | Admitting: Physical Therapy

## 2014-06-28 NOTE — Assessment & Plan Note (Addendum)
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 She was then switched to dose adjusted BEACOPP x2 cycles. Her PET scan after the second cycle showed complete remission. Because of probable inflammatory changes in the lungs and a prior Citrobacter blood stream/pulmonary infection felt to be the cause of the PET scan findings in the lungs she has had Levaquin which she is now finished. Now S/P involved field radiation by Dr. Thea Silversmith ([place]) finishing on 11/20/2012.  Labs in 4 months: CBC diff, CMET, ESR, B2M, LDH.  S/P DG B/L orbits which are negative for any metal foreign objects.  S/P mammogram on 06/11/2014 which is negative for malignancy, but I do not have the report available and I will ask nurses to get a copy of that report.  MRI of breasts will alternate with mammography (imaging tests will be performed every 6 months).  CT CAP with contrast in March 2016. MRI of breasts due in June 2016 with repeat mammogram in Jan 2017.  Return in 4 months for follow-up.

## 2014-06-28 NOTE — Progress Notes (Signed)
[name] Cahill, MD  [name] [address]  Hodgkin's lymphoma - Plan: CBC with Differential, Comprehensive metabolic panel, Sedimentation rate, Lactate dehydrogenase, Beta 2 microglobuline, serum  Preventative health care - Plan: MM Digital Screening  Fatigue due to depression - Plan: CBC with Differential, Comprehensive metabolic panel, Sedimentation rate, Lactate dehydrogenase, Beta 2 microglobuline, serum  Depression - Plan: CBC with Differential, Comprehensive metabolic panel, Sedimentation rate, Lactate dehydrogenase, Beta 2 microglobuline, serum  CURRENT THERAPY: Surveillance per NCCN guidelines  INTERVAL HISTORY: [name] [name] 44 y.o. 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 She was then switched to dose adjusted BEACOPP x2 cycles. Her PET scan after the second cycle showed complete remission. Because of probable inflammatory changes in the lungs and a prior Citrobacter blood stream/pulmonary infection felt to be the cause of the PET scan findings in the lungs she has had Levaquin which she is now finished. Now S/P involved field radiation by Dr. Thea Silversmith ([place]) finishing on 11/20/2012.  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 She was then switched to dose adjusted BEACOPP x2 cycles. Her PET scan after the second cycle showed complete remission. Because of probable inflammatory changes in the lungs and a prior Citrobacter blood stream/pulmonary  infection felt to be the cause of the PET scan findings in the lungs she has had Levaquin which she is now finished. Now S/P involved field radiation by Dr. Thea Silversmith ([place]) finishing on 11/20/2012.     Hodgkin's lymphoma   09/14/2010 Imaging Chest xray demonstrating- Possible mediastinal mass as discussed above.  Recommend chest CT scan for further evaluation.  Further work-up by ordering physician never performed   02/26/2012 Imaging CT of chest- Large anterior mediastinal mass is identified and is worrisome for tumor.  This is encasing the great vessels and S causing significant narrowing of the left subclavian vein.   03/05/2012 Bone Marrow Biopsy Negative   03/05/2012 Initial Diagnosis Anterior mediastinal mass biopsy- Classical Hodgkin's lymphoma   03/13/2012 Imaging PET scan- hypermetabolic anterior mediastinal mass, extension of dominant mass in the low left neck, isolated small right lower jugular hypermetabolic node, no subdiaphragmatic disease.   03/27/2012 - 08/07/2012 Chemotherapy ABVD x 5 cycles but with incomplete response evaluated after cycle 3 and after cycle #5.   06/20/2012 Imaging PET scan- interval response to therapy as evidenced by decrease in size and metabolic activity involving the anterior mediastinal mass   08/16/2012 Imaging PET scan- The anterior mediastinal soft tissue mass shows no interval change in size and there is a persistent small peripheral focus of hypermetabolism within this lesion on today's exam.   08/28/2012 - 09/25/2012 Chemotherapy BEACOPP x 2 cycles   10/08/2012 Imaging PET scan- Today's study demonstrates a positive response to therapy with decrease in size and resolution of hypermetabolism within the anterior mediastinal nodal mass.  The mass currently measures 6.2 x 2.5 cm.   10/09/2012 Remission    10/23/2012 - 11/20/2012 Radiation Therapy Dr. Pablo Ledger  12/02/2012 Adverse Reaction Radiation recall- negative work-up.  Treated with Prednisone.   05/26/2013  Imaging PET scan- No substantial change in size of the anterior mediastinal mass although there is no hypermetabolism within this lesion on the current study.  Interval decrease in spleen size.   11/12/2013 Imaging CT CAP- decrease size in medistinal mass.  NED.   02/09/2014 PET scan Persistent soft tissue within the anterior mediastinum with very low level metabolic activity (Deauville Criteria 2). Findings likely reflect treated tumor.    06/11/2014 Mammogram Negative   07/01/2014 Imaging Xray of orbits- No evidence of metallic foreign body within the orbits.   I personally reviewed and went over laboratory results with the patient.  The results are noted within this dictation.  I personally reviewed and went over radiographic studies with the patient.  The results are noted within this dictation.  Xray of orbits are negative for metal.  She had a mammogram at [facility] and this was negative the patient reports, but I do not have a copy of that report.  I have requested our nurses ascertain a copy of this report for our records.  On our last encounter, I had ordered an Xray of orbits to rule out metal shards in eye in order to allow for MRI of breasts after patient reported a past injury that may have left metal in eyes.   This is required prior to performing MRI of breast.    She feels good.  She notes some fatigue, but this is improving.  Unfortunately, [name] is in a hurry today to get to another appointment.  Unfortunately, I was 30 minutes late for her appt.    She denies any B symptoms, chest pain, dyspnea, new masses or lesions.  Hematologically, she denies any complaints.  Past Medical History  Diagnosis Date  . Fibromyalgia   . Eczema   . Depression     parents died close together "was hard"  . Restless leg   . Sleep apnea     Sleep Study- 1996- "little apnea- no tx"  . Headache(784.0)     Birth control pills help  . Hemorrhoid   . Anemia     As a child and while pregnant  . 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 Hypertension     recently dx with hodgkins  . Dyspnea     all the time .".due to the mass"  . History of radiation therapy 10/28/12-11/20/12    chest & neck,34Gy/75f  . S/P cholecystectomy 09/14/2013    08/29/2013 by Dr. JArnoldo Morale  Negative pathology    has Fibromyalgia; Eczema; Hypertension; Depression; Restless leg; Hodgkin's lymphoma; Citrobacter infection; S/P cholecystectomy; and Leg edema on her problem list.     is allergic to other and sulfur.  Ms. TSlomskihad no medications administered during this visit.  Past Surgical History  Procedure Laterality Date  . Wisdom tooth extraction    . Dilation and curettage of uterus  1991  . Mediastinal mass biopsy  03/05/12  . Bone marrow biopsy  03/20/2012  . Portacath placement  03/26/2012    Procedure: INSERTION PORT-A-CATH;  Surgeon: SMelrose Nakayama MD;  [place] [facility]  Service: Thoracic;  Laterality: N/A;  . Port-a-cath removal Left 10/16/2012    Procedure: REMOVAL PORT-A-CATH;  Surgeon: SMelrose Nakayama MD;  [place]  Service: Thoracic;  Laterality: Left;  . Cholecystectomy N/A 08/29/2013    Procedure: LAPAROSCOPIC CHOLECYSTECTOMY;  Surgeon: MElta Guadeloupe  Lowella Petties, MD;  Location: [facility];  Service: General;  Laterality: N/A;    Denies any headaches, dizziness, double vision, fevers, chills, night sweats, nausea, vomiting, diarrhea, constipation, chest pain, heart palpitations, shortness of breath, blood in stool, black tarry stool, urinary pain, urinary burning, urinary frequency, hematuria.   PHYSICAL EXAMINATION  ECOG PERFORMANCE STATUS: 0 - Asymptomatic  Filed Vitals:   07/01/14 1457  BP: 138/96  Pulse: 79  Temp: 97.9 F (36.6 C)  Resp: 16    GENERAL:alert, no distress, well nourished, well developed, comfortable, cooperative, obese and smiling SKIN: skin color, texture, turgor are normal, no rashes or significant lesions HEAD: Normocephalic, No  masses, lesions, tenderness or abnormalities EYES: normal, PERRLA, EOMI, Conjunctiva are pink and non-injected EARS: External ears normal OROPHARYNX:mucous membranes are moist  NECK: supple, no adenopathy, thyroid normal size, non-tender, without nodularity, no stridor, non-tender, trachea midline LYMPH:  no palpable lymphadenopathy, no hepatosplenomegaly BREAST:not examined LUNGS: clear to auscultation and percussion HEART: regular rate & rhythm, no murmurs, no gallops, S1 normal and S2 normal ABDOMEN:abdomen soft, non-tender and normal bowel sounds BACK: Back symmetric, no curvature. EXTREMITIES:less then 2 second capillary refill, no joint deformities, effusion, or inflammation, no skin discoloration, no clubbing, no cyanosis  NEURO: alert & oriented x 3 with fluent speech, no focal motor/sensory deficits, gait normal   LABORATORY DATA: CBC    Component Value Date/Time   WBC 5.8 07/01/2014 1546   WBC 2.5* 11/13/2012 1552   RBC 4.23 07/01/2014 1546   RBC 3.04* 11/13/2012 1552   HGB 13.8 07/01/2014 1546   HGB 10.5* 11/13/2012 1552   HCT 39.7 07/01/2014 1546   HCT 29.6* 11/13/2012 1552   PLT 252 07/01/2014 1546   PLT 146 11/13/2012 1552   MCV 93.9 07/01/2014 1546   MCV 97.6 11/13/2012 1552   MCH 32.6 07/01/2014 1546   MCH 34.7* 11/13/2012 1552   MCHC 34.8 07/01/2014 1546   MCHC 35.6 11/13/2012 1552   RDW 12.4 07/01/2014 1546   RDW 16.3* 11/13/2012 1552   LYMPHSABS 1.0 07/01/2014 1546   LYMPHSABS 0.1* 11/13/2012 1552   MONOABS 0.5 07/01/2014 1546   MONOABS 0.4 11/13/2012 1552   EOSABS 0.1 07/01/2014 1546   EOSABS 0.1 11/13/2012 1552   BASOSABS 0.0 07/01/2014 1546   BASOSABS 0.0 11/13/2012 1552      Chemistry      Component Value Date/Time   NA 138 07/01/2014 1546   NA 136 11/13/2012 1552   K 3.8 07/01/2014 1546   K 3.6 11/13/2012 1552   CL 103 07/01/2014 1546   CL 101 11/13/2012 1552   CO2 29 07/01/2014 1546   CO2 27 11/13/2012 1552   BUN 16 07/01/2014 1546    BUN 20.4 11/13/2012 1552   CREATININE 0.78 07/01/2014 1546   CREATININE 1.0 11/13/2012 1552      Component Value Date/Time   CALCIUM 9.8 07/01/2014 1546   CALCIUM 9.0 11/13/2012 1552   ALKPHOS 52 07/01/2014 1546   ALKPHOS 62 11/13/2012 1552   AST 19 07/01/2014 1546   AST 27 11/13/2012 1552   ALT 17 07/01/2014 1546   ALT 22 11/13/2012 1552   BILITOT 0.6 07/01/2014 1546   BILITOT 0.88 11/13/2012 1552      Results for LOWEN, MANSOURI (MRN [id number]) as of 06/28/2014 13:02  Ref. Range 03/31/2014 14:35  LDH Latest Range: 94-250 U/L 278 (H)   Results for LATEYA, DAURIA (MRN [id number]) as of 06/28/2014 13:02  Ref. 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 She was then switched to dose adjusted BEACOPP x2 cycles. Her PET scan after the second cycle showed complete remission. Because of probable inflammatory changes in the lungs and a prior Citrobacter blood stream/pulmonary infection felt to be the cause of the PET scan findings in the lungs she has had Levaquin which she is now finished. Now S/P involved field radiation by Dr. Thea Silversmith ([place]) finishing on 11/20/2012.  Labs in 4 months: CBC diff, CMET, ESR, B2M, LDH.  S/P DG B/L orbits which are negative for any metal foreign objects.  S/P mammogram on 06/11/2014 which is negative for malignancy, but I do not have the report available and I will ask nurses to get a copy of that report.  MRI of breasts will alternate with mammography (imaging tests will be performed every 6 months).  CT CAP with contrast in March 2016. MRI of breasts due in June 2016 with repeat mammogram in Jan  2017.  Return in 4 months for follow-up.     THERAPY PLAN:  NCCN guidelines for surveillance of Hodgkin's Lymphoma (Stage I-IV) for first 5 years are as follows:  A. H+P every 3-6 months for years 1-2, then every 6-12 months until year 3, then annually.   B. Annual influenza vaccine.  C. Laboratory studies: CBC diff, ESR (if elevated at time of initial diagnosis), CMET.  TSH at least annually if RT to neck.  D. Chest x-ray or CT every 6-12 months during first 2 years, then chest x-ray is optional.  E. Abdominal/pelvic CT every 6-12 months for first 2 years  F. Counseling: Reproduction, health habits, psychosocial, cardiovascular, breast self-exam, skin cancer risk, end-of treatment discussion.  G. Surveillance PET scan should not be done routinely due to risk for false positive.  Management decisions should not be based on PET scan alone; clinical or pathologic correlation is needed.   NCCN Guidelines for monitoring for late effects after 5 years are:  A. H+P annually  B. Annual blood pressure, aggressive management of cardiovascular risk factors  C. Pneumococcal, meningococcal, and H. Flue revaccination after 5-7 years, if patient treated with splenic RT of previous splenectomy  D. Annual influenza vaccine.  E. Cardiovascular symptoms may emerge at a young age   17. Consider stress test/echocardiogram at 10 year intervals after treatment is completed, especially if chest cardiac irradiation.   2. Consider carotid US at 10 year intervals if neck irradiation.  F. Lab studies: CBC diff, CMET annually and TSH at least annually if RT to neck and annual lipids.  G. Consider chest imaging for patients at increased risk for lung cancer.  H. Annual breast screening:  Initiate 8-10 years post-therapy, or at age 17, whichever comes first, if chest or axillary radiation.  The NCCN Hodgkin Lymphoma Guideline s Panel recommends breast MRI in addition to mammography for women who received irradiation to  the chest between ages 19 and 30 years, which is consistent with the American Cancer Society Guidelines.  I. Counseling: reproduction, health habits, psychosocial, cardiovascular, breast self-exam, and skin cancer risk.  J. Treatment summary and consideration of transfer to PCP.  K. Consider a referral to a survivorship  clinic.   All questions were answered. The patient knows to call the clinic with any problems, questions or concerns. We can certainly see the patient much sooner if necessary.  Patient and plan discussed with Dr. Ancil Linsey and she is in agreement with the aforementioned.   Dub Maclellan 07/01/2014

## 2014-06-30 ENCOUNTER — Other Ambulatory Visit (HOSPITAL_COMMUNITY): Payer: Self-pay

## 2014-07-01 ENCOUNTER — Encounter (HOSPITAL_COMMUNITY): Payer: BLUE CROSS/BLUE SHIELD

## 2014-07-01 ENCOUNTER — Encounter (HOSPITAL_COMMUNITY): Payer: BLUE CROSS/BLUE SHIELD | Attending: Oncology | Admitting: Oncology

## 2014-07-01 ENCOUNTER — Encounter (HOSPITAL_COMMUNITY): Payer: Self-pay | Admitting: Oncology

## 2014-07-01 ENCOUNTER — Ambulatory Visit (HOSPITAL_COMMUNITY)
Admission: RE | Admit: 2014-07-01 | Discharge: 2014-07-01 | Disposition: A | Discharge status: Home / Self Care | Payer: BLUE CROSS/BLUE SHIELD | Source: Ambulatory Visit | Attending: Oncology | Admitting: Oncology

## 2014-07-01 VITALS — BP 138/96 | HR 79 | Temp 97.9°F | Resp 16 | Wt 213.1 lb

## 2014-07-01 DIAGNOSIS — C8190 Hodgkin lymphoma, unspecified, unspecified site: Secondary | ICD-10-CM

## 2014-07-01 DIAGNOSIS — Z0389 Encounter for observation for other suspected diseases and conditions ruled out: Secondary | ICD-10-CM | POA: Diagnosis present

## 2014-07-01 DIAGNOSIS — R5383 Other fatigue: Secondary | ICD-10-CM | POA: Diagnosis not present

## 2014-07-01 DIAGNOSIS — F329 Major depressive disorder, single episode, unspecified: Secondary | ICD-10-CM | POA: Insufficient documentation

## 2014-07-01 DIAGNOSIS — M797 Fibromyalgia: Secondary | ICD-10-CM | POA: Insufficient documentation

## 2014-07-01 DIAGNOSIS — Z0000 Encounter for general adult medical examination without abnormal findings: Secondary | ICD-10-CM

## 2014-07-01 DIAGNOSIS — C8118 Nodular sclerosis classical Hodgkin lymphoma, lymph nodes of multiple sites: Secondary | ICD-10-CM

## 2014-07-01 DIAGNOSIS — C8191 Hodgkin lymphoma, unspecified, lymph nodes of head, face, and neck: Secondary | ICD-10-CM | POA: Insufficient documentation

## 2014-07-01 DIAGNOSIS — F32A Depression, unspecified: Secondary | ICD-10-CM

## 2014-07-01 DIAGNOSIS — S0552XD Penetrating wound with foreign body of left eyeball, subsequent encounter: Secondary | ICD-10-CM

## 2014-07-01 LAB — COMPREHENSIVE METABOLIC PANEL
ALT: 17 U/L (ref 0–35)
AST: 19 U/L (ref 0–37)
Albumin: 4.5 g/dL (ref 3.5–5.2)
Alkaline Phosphatase: 52 U/L (ref 39–117)
Anion gap: 6 (ref 5–15)
BUN: 16 mg/dL (ref 6–23)
CO2: 29 mmol/L (ref 19–32)
Calcium: 9.8 mg/dL (ref 8.4–10.5)
Chloride: 103 mEq/L (ref 96–112)
Creatinine, Ser: 0.78 mg/dL (ref 0.50–1.10)
GFR calc Af Amer: >90 mL/min (ref >90)
GFR calc non Af Amer: >90 mL/min (ref >90)
Glucose, Bld: 101 mg/dL — ABNORMAL HIGH (ref 70–99)
Potassium: 3.8 mmol/L (ref 3.5–5.1)
Sodium: 138 mmol/L (ref 135–145)
Total Bilirubin: 0.6 mg/dL (ref 0.3–1.2)
Total Protein: 7.6 g/dL (ref 6.0–8.3)

## 2014-07-01 LAB — CBC WITH DIFFERENTIAL/PLATELET
Basophils Absolute: 0 10*3/uL (ref 0.0–0.1)
Basophils Relative: 0 % (ref 0–1)
Eosinophils Absolute: 0.1 10*3/uL (ref 0.0–0.7)
Eosinophils Relative: 1 % (ref 0–5)
HCT: 39.7 % (ref 36.0–46.0)
Hemoglobin: 13.8 g/dL (ref 12.0–15.0)
Lymphocytes Relative: 17 % (ref 12–46)
Lymphs Abs: 1 10*3/uL (ref 0.7–4.0)
MCH: 32.6 pg (ref 26.0–34.0)
MCHC: 34.8 g/dL (ref 30.0–36.0)
MCV: 93.9 fL (ref 78.0–100.0)
Monocytes Absolute: 0.5 10*3/uL (ref 0.1–1.0)
Monocytes Relative: 9 % (ref 3–12)
Neutro Abs: 4.2 10*3/uL (ref 1.7–7.7)
Neutrophils Relative %: 73 % (ref 43–77)
Platelets: 252 10*3/uL (ref 150–400)
RBC: 4.23 MIL/uL (ref 3.87–5.11)
RDW: 12.4 % (ref 11.5–15.5)
WBC: 5.8 10*3/uL (ref 4.0–10.5)

## 2014-07-01 LAB — SEDIMENTATION RATE: Sed Rate: 6 mm/hr (ref 0–22)

## 2014-07-01 LAB — LACTATE DEHYDROGENASE: LDH: 149 U/L (ref 94–250)

## 2014-07-01 NOTE — Progress Notes (Signed)
[name] [name] presented for labwork. Labs per MD order drawn via Peripheral Line 23 gauge needle inserted in right AC  Good blood return present. Procedure without incident.  Needle removed intact. Patient tolerated procedure well.

## 2014-07-01 NOTE — Progress Notes (Signed)
See office visit for information.

## 2014-07-01 NOTE — Patient Instructions (Signed)
[place] at [facility]  Discharge Instructions:  Labs today and in 4 months. MRI breast in June 2016. CT CAP in March 2016. Return in 4 months for follow-up Please call with any questions or concerns.   _______________________________________________________________  Thank you for choosing [place] at [facility] to provide your oncology and hematology care.  To afford each patient quality time with our providers, please arrive at least 15 minutes before your scheduled appointment.  You need to re-schedule your appointment if you arrive 10 or more minutes late.  We strive to give you quality time with our providers, and arriving late affects you and other patients whose appointments are after yours.  Also, if you no show three or more times for appointments you may be dismissed from the clinic.  Again, thank you for choosing [place] at [place] hope is that these requests will allow you access to exceptional care and in a timely manner. _______________________________________________________________  If you have questions after your visit, please contact our office at [phone] between the hours of 8:30 a.m. and 5:00 p.m. Voicemails left after 4:30 p.m. will not be returned until the following business day. _______________________________________________________________  For prescription refill requests, have your pharmacy contact our office. _______________________________________________________________  Recommendations made by the consultant and any test results will be sent to your referring physician. _______________________________________________________________

## 2014-07-02 ENCOUNTER — Telehealth (HOSPITAL_COMMUNITY): Payer: Self-pay

## 2014-07-02 NOTE — Telephone Encounter (Signed)
Message left for patient to contact [facility] and request copy of mammogram be sent to [place] as they will not send without Laurabeth calling them or coming by to complete a records' request.

## 2014-07-02 NOTE — Telephone Encounter (Signed)
-----   Message from Baird Cancer, PA-C sent at 07/01/2014  8:31 PM EST ----- Please get a copy of report of mammogram from [facility].  Done on 06/11/2014.

## 2014-07-03 LAB — BETA 2 MICROGLOBULIN, SERUM: Beta-2 Microglobulin: 1.77 mg/L — ABNORMAL HIGH (ref <=2.51)

## 2014-07-08 ENCOUNTER — Other Ambulatory Visit (HOSPITAL_COMMUNITY): Payer: Self-pay | Admitting: Oncology

## 2014-07-08 DIAGNOSIS — Z9889 Other specified postprocedural states: Secondary | ICD-10-CM

## 2014-08-18 ENCOUNTER — Ambulatory Visit (HOSPITAL_COMMUNITY)
Admission: RE | Admit: 2014-08-18 | Discharge: 2014-08-18 | Disposition: A | Discharge status: Home / Self Care | Payer: BLUE CROSS/BLUE SHIELD | Source: Ambulatory Visit | Attending: Oncology | Admitting: Oncology

## 2014-08-18 DIAGNOSIS — C8190 Hodgkin lymphoma, unspecified, unspecified site: Secondary | ICD-10-CM | POA: Insufficient documentation

## 2014-08-18 DIAGNOSIS — Z08 Encounter for follow-up examination after completed treatment for malignant neoplasm: Secondary | ICD-10-CM | POA: Insufficient documentation

## 2014-08-18 DIAGNOSIS — F32A Depression, unspecified: Secondary | ICD-10-CM

## 2014-08-18 DIAGNOSIS — F329 Major depressive disorder, single episode, unspecified: Secondary | ICD-10-CM

## 2014-08-18 DIAGNOSIS — R5383 Other fatigue: Secondary | ICD-10-CM

## 2014-08-18 MED ORDER — IOHEXOL 300 MG/ML  SOLN
100.0000 mL | Freq: Once | INTRAMUSCULAR | Status: AC | PRN
  Administered 2014-08-18: 100 mL via INTRAVENOUS

## 2014-10-30 ENCOUNTER — Other Ambulatory Visit (HOSPITAL_COMMUNITY): Payer: BLUE CROSS/BLUE SHIELD

## 2014-10-30 ENCOUNTER — Encounter (HOSPITAL_COMMUNITY): Payer: BLUE CROSS/BLUE SHIELD | Attending: Hematology & Oncology

## 2014-10-30 ENCOUNTER — Encounter (HOSPITAL_COMMUNITY): Payer: Self-pay | Admitting: Hematology & Oncology

## 2014-10-30 ENCOUNTER — Ambulatory Visit (HOSPITAL_COMMUNITY): Payer: BLUE CROSS/BLUE SHIELD | Admitting: Oncology

## 2014-10-30 ENCOUNTER — Ambulatory Visit (HOSPITAL_COMMUNITY): Payer: BLUE CROSS/BLUE SHIELD | Admitting: Hematology & Oncology

## 2014-10-30 ENCOUNTER — Encounter (HOSPITAL_BASED_OUTPATIENT_CLINIC_OR_DEPARTMENT_OTHER): Payer: BLUE CROSS/BLUE SHIELD | Admitting: Hematology & Oncology

## 2014-10-30 VITALS — BP 147/83 | HR 90 | Temp 98.9°F | Resp 16 | Wt 216.6 lb

## 2014-10-30 DIAGNOSIS — R5383 Other fatigue: Secondary | ICD-10-CM | POA: Insufficient documentation

## 2014-10-30 DIAGNOSIS — F329 Major depressive disorder, single episode, unspecified: Secondary | ICD-10-CM

## 2014-10-30 DIAGNOSIS — C8190 Hodgkin lymphoma, unspecified, unspecified site: Secondary | ICD-10-CM | POA: Diagnosis not present

## 2014-10-30 DIAGNOSIS — F32A Depression, unspecified: Secondary | ICD-10-CM

## 2014-10-30 LAB — TSH: TSH: 6.883 u[IU]/mL — ABNORMAL HIGH (ref 0.350–4.500)

## 2014-10-30 LAB — COMPREHENSIVE METABOLIC PANEL
ALT: 18 U/L (ref 14–54)
AST: 19 U/L (ref 15–41)
Albumin: 4.1 g/dL (ref 3.5–5.0)
Alkaline Phosphatase: 47 U/L (ref 38–126)
Anion gap: 9 (ref 5–15)
BUN: 15 mg/dL (ref 6–20)
CO2: 24 mmol/L (ref 22–32)
Calcium: 9.3 mg/dL (ref 8.9–10.3)
Chloride: 104 mmol/L (ref 101–111)
Creatinine, Ser: 0.85 mg/dL (ref 0.44–1.00)
GFR calc Af Amer: >60 mL/min (ref >60)
GFR calc non Af Amer: >60 mL/min (ref >60)
Glucose, Bld: 98 mg/dL (ref 65–99)
Potassium: 3.7 mmol/L (ref 3.5–5.1)
Sodium: 137 mmol/L (ref 135–145)
Total Bilirubin: 0.4 mg/dL (ref 0.3–1.2)
Total Protein: 7.2 g/dL (ref 6.5–8.1)

## 2014-10-30 LAB — CBC WITH DIFFERENTIAL/PLATELET
Basophils Absolute: 0 10*3/uL (ref 0.0–0.1)
Basophils Relative: 0 % (ref 0–1)
Eosinophils Absolute: 0.2 10*3/uL (ref 0.0–0.7)
Eosinophils Relative: 3 % (ref 0–5)
HCT: 39 % (ref 36.0–46.0)
Hemoglobin: 13.4 g/dL (ref 12.0–15.0)
Lymphocytes Relative: 19 % (ref 12–46)
Lymphs Abs: 1.1 10*3/uL (ref 0.7–4.0)
MCH: 32.1 pg (ref 26.0–34.0)
MCHC: 34.4 g/dL (ref 30.0–36.0)
MCV: 93.5 fL (ref 78.0–100.0)
Monocytes Absolute: 0.4 10*3/uL (ref 0.1–1.0)
Monocytes Relative: 6 % (ref 3–12)
Neutro Abs: 4.3 10*3/uL (ref 1.7–7.7)
Neutrophils Relative %: 72 % (ref 43–77)
Platelets: 231 10*3/uL (ref 150–400)
RBC: 4.17 MIL/uL (ref 3.87–5.11)
RDW: 12.2 % (ref 11.5–15.5)
WBC: 5.9 10*3/uL (ref 4.0–10.5)

## 2014-10-30 LAB — LACTATE DEHYDROGENASE: LDH: 153 U/L (ref 98–192)

## 2014-10-30 LAB — SEDIMENTATION RATE: Sed Rate: 3 mm/hr (ref 0–22)

## 2014-10-30 MED ORDER — CITALOPRAM HYDROBROMIDE 20 MG PO TABS: ORAL_TABLET | ORAL | Status: DC

## 2014-10-30 NOTE — Progress Notes (Signed)
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 She was then switched to dose adjusted BEACOPP x2 cycles. Her PET scan after the second cycle showed complete remission. Because of probable inflammatory changes in the lungs and a prior Citrobacter blood stream/pulmonary infection felt to be the cause of the PET scan findings in the lungs. Now S/P involved field radiation by Dr. Thea Silversmith ([place]) finishing on 11/20/2012.  Chronic back pain since radiation. "Was not bad in the beginning, but has been the past 5-6 months." Fatigue was described as, "chronic, all the time." When engaging in physical activity "huff and puff" and crashes, afterwards. Expressed difficulty with sleeping. Recently, started snoring. The past few years has never felt rested on awakening. Had a sleep study she thinks 12 years ago.. Weight gain, even with diet restrictions.  Patient was on antidepressant Pristiq from 2012 to 2013, after the passing of her Father. This medication did not cause nausea and she feels she tolerated it well. Had a negative reactions to Effexor!  She complains of wanting to improve her fatigue and energy level. She states she is very frustrated and defeated by her fatigue.  Oncology History   stage II A. bulky mediastinal nodular sclerosing Hodgkin's disease presenting with a large anterior mediastinal mass as well as left cervical and right cervical lymph nodes status post 5 cycles of ABVD but with incomplete response  evaluated after cycle 3 and after cycle #5. She was then switched to dose adjusted BEACOPP x2 cycles. Her PET scan after the second cycle showed complete remission. Because of probable inflammatory changes in the lungs and a prior Citrobacter blood stream/pulmonary infection felt to be the cause of the PET scan findings in the lungs she has had Levaquin which she is now finished. Now S/P involved field radiation by Dr. Thea Silversmith ([place]) finishing on 11/20/2012.     Hodgkin's lymphoma   09/14/2010 Imaging Chest xray demonstrating- Possible mediastinal mass as discussed above.  Recommend chest CT scan for further evaluation.  Further work-up by ordering physician never performed   02/26/2012 Imaging CT of chest- Large anterior mediastinal mass is identified and is worrisome for tumor.  This is encasing the great vessels and S causing significant narrowing of the left subclavian vein.   03/05/2012 Bone Marrow Biopsy Negative   03/05/2012 Initial Diagnosis Anterior mediastinal mass biopsy- Classical Hodgkin's lymphoma   03/13/2012 Imaging PET scan- hypermetabolic anterior mediastinal mass, extension of dominant mass in the low left neck, isolated small right lower jugular hypermetabolic node, no subdiaphragmatic disease.   03/27/2012 - 08/07/2012 Chemotherapy ABVD x 5 cycles but with incomplete response evaluated after cycle 3 and after cycle #5.   06/20/2012 Imaging PET scan- interval response to therapy as evidenced by decrease in size and metabolic activity involving the anterior mediastinal mass   08/16/2012 Imaging PET scan- The anterior mediastinal soft tissue mass shows no interval change in size and there is a persistent small peripheral focus of hypermetabolism within this lesion on today's exam.  08/28/2012 - 09/25/2012 Chemotherapy BEACOPP x 2 cycles   10/08/2012 Imaging PET scan- Today's study demonstrates a positive response to therapy with decrease in size and resolution of hypermetabolism within  the anterior mediastinal nodal mass.  The mass currently measures 6.2 x 2.5 cm.   10/09/2012 Remission    10/23/2012 - 11/20/2012 Radiation Therapy Dr. Pablo Ledger   12/02/2012 Adverse Reaction Radiation recall- negative work-up.  Treated with Prednisone.   05/26/2013 Imaging PET scan- No substantial change in size of the anterior mediastinal mass although there is no hypermetabolism within this lesion on the current study.  Interval decrease in spleen size.   11/12/2013 Imaging CT CAP- decrease size in medistinal mass.  NED.   02/09/2014 PET scan Persistent soft tissue within the anterior mediastinum with very low level metabolic activity (Deauville Criteria 2). Findings likely reflect treated tumor.    06/11/2014 Mammogram Negative   07/01/2014 Imaging Xray of orbits- No evidence of metallic foreign body within the orbits.    Past Medical History  Diagnosis Date  . Fibromyalgia   . Eczema   . Depression     parents died close together "was hard"  . Restless leg   . Sleep apnea     Sleep Study- 1996- "little apnea- no tx"  . Headache(784.0)     Birth control pills help  . Hemorrhoid   . Anemia     As a child and while pregnant  . 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 Hypertension     recently dx with hodgkins  . Dyspnea     all the time .".due to the mass"  . History of radiation therapy 10/28/12-11/20/12    chest & neck,34Gy/32f  . S/P cholecystectomy 09/14/2013    08/29/2013 by Dr. JArnoldo Morale  Negative pathology    has Fibromyalgia; Eczema; Hypertension; Depression; Restless leg; Hodgkin's lymphoma; Citrobacter infection; S/P cholecystectomy; and Leg edema on her problem list.     is allergic to other and sulfur.  Ms. TLacinadoes not currently have medications on file.   Past Surgical History  Procedure Laterality Date  . Wisdom tooth extraction    . Dilation and curettage of uterus  1991  . Mediastinal mass biopsy  03/05/12  . Bone marrow biopsy   03/20/2012  . Portacath placement  03/26/2012    Procedure: INSERTION PORT-A-CATH;  Surgeon: SMelrose Nakayama MD;  [facility]  Service: Thoracic;  Laterality: N/A;  . Port-a-cath removal Left 10/16/2012    Procedure: REMOVAL PORT-A-CATH;  Surgeon: SMelrose Nakayama MD;  [facility]  Service: Thoracic;  Laterality: Left;  . Cholecystectomy N/A 08/29/2013    Procedure: LAPAROSCOPIC CHOLECYSTECTOMY;  Surgeon: MJamesetta So MD;  Location: [facility];  Service: General;  Laterality: N/A;    Denies any headaches, dizziness, double vision, fevers, chills, night sweats, nausea, vomiting, diarrhea, constipation, chest pain, heart palpitations, shortness of breath, blood in stool, black tarry stool, urinary pain, urinary burning, urinary frequency, hematuria. 14 point review of systems was performed and is negative except as detailed under history of present illness and above    PHYSICAL EXAMINATION  ECOG PERFORMANCE STATUS: 0 - Asymptomatic  Filed Vitals:   10/30/14 1432  BP: 147/83  Pulse: 90  Temp: 98.9 F (37.2 C)  Resp: 16    GENERAL:alert, no distress, well nourished, well developed, comfortable, cooperative, obese SKIN: skin color, texture, turgor are normal, no rashes or significant lesions HEAD: Normocephalic, No masses, lesions,  tenderness or abnormalities EYES: normal, PERRLA, EOMI, Conjunctiva are pink and non-injected EARS: External ears normal OROPHARYNX: mucous membranes are moist  NECK: supple, no adenopathy, thyroid normal size, non-tender, without nodularity, no stridor, non-tender, trachea midline LYMPH:  no palpable lymphadenopathy, no hepatosplenomegaly BREAST: red skin from radiation treatments LUNGS: clear to auscultation and percussion HEART: regular rate & rhythm, no murmurs, no gallops, S1 normal and S2 normal ABDOMEN: abdomen soft, non-tender and normal bowel sounds BACK: Back symmetric, no curvature. EXTREMITIES: less then 2 second capillary  refill, no joint deformities, effusion, or inflammation, no skin discoloration, no clubbing, no cyanosis  NEURO: alert & oriented x 3 with fluent speech, no focal motor/sensory deficits, gait normal   LABORATORY DATA: CBC    Component Value Date/Time   WBC 5.9 10/30/2014 1600   WBC 2.5* 11/13/2012 1552   RBC 4.17 10/30/2014 1600   RBC 3.04* 11/13/2012 1552   HGB 13.4 10/30/2014 1600   HGB 10.5* 11/13/2012 1552   HCT 39.0 10/30/2014 1600   HCT 29.6* 11/13/2012 1552   PLT 231 10/30/2014 1600   PLT 146 11/13/2012 1552   MCV 93.5 10/30/2014 1600   MCV 97.6 11/13/2012 1552   MCH 32.1 10/30/2014 1600   MCH 34.7* 11/13/2012 1552   MCHC 34.4 10/30/2014 1600   MCHC 35.6 11/13/2012 1552   RDW 12.2 10/30/2014 1600   RDW 16.3* 11/13/2012 1552   LYMPHSABS 1.1 10/30/2014 1600   LYMPHSABS 0.1* 11/13/2012 1552   MONOABS 0.4 10/30/2014 1600   MONOABS 0.4 11/13/2012 1552   EOSABS 0.2 10/30/2014 1600   EOSABS 0.1 11/13/2012 1552   BASOSABS 0.0 10/30/2014 1600   BASOSABS 0.0 11/13/2012 1552      Chemistry      Component Value Date/Time   NA 137 10/30/2014 1600   NA 136 11/13/2012 1552   K 3.7 10/30/2014 1600   K 3.6 11/13/2012 1552   CL 104 10/30/2014 1600   CL 101 11/13/2012 1552   CO2 24 10/30/2014 1600   CO2 27 11/13/2012 1552   BUN 15 10/30/2014 1600   BUN 20.4 11/13/2012 1552   CREATININE 0.85 10/30/2014 1600   CREATININE 1.0 11/13/2012 1552      Component Value Date/Time   CALCIUM 9.3 10/30/2014 1600   CALCIUM 9.0 11/13/2012 1552   ALKPHOS 47 10/30/2014 1600   ALKPHOS 62 11/13/2012 1552   AST 19 10/30/2014 1600   AST 27 11/13/2012 1552   ALT 18 10/30/2014 1600   ALT 22 11/13/2012 1552   BILITOT 0.4 10/30/2014 1600   BILITOT 0.88 11/13/2012 1552     Results for ALONZO, LOVING (MRN [id number]) as of 11/01/2014 08:01  Ref. Range 10/30/2014 16:00  LDH Latest Ref Range: 98-192 U/L 153   Results for MARYANA, PITTMON (MRN [id number]) as of 11/01/2014 08:01  Ref.  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 I believe she needs a referral back to the [facility] with emphasis on outpatient exercises she can continue once formally discharged. I have encouraged her to begin a formal walking program daily, working her way up to 30 minutes a day at least 5 or 6 days a week. I have also referred her to our local yoga program which I feel would benefit her greatly.  I  have started her on Celexa 20 mg. She had excellent tolerance to Pristiq in the past but her insurance per our medical records will not cover it. We will check her hormone levels and her thyroid function. I will keep her apprised of the results. I would like to see her back in 6 weeks to see how she is doing and continue to work with her on her survivorship issues.    THERAPY PLAN:  NCCN guidelines for surveillance of Hodgkin's Lymphoma (Stage I-IV) for first 5 years are as follows:  A. H+P every 3-6 months for years 1-2, then every 6-12 months until year 3, then annually.   B. Annual influenza vaccine.  C. Laboratory studies: CBC diff, ESR (if elevated at time of initial diagnosis), CMET.  TSH at least annually if RT to neck.  D. Chest x-ray or CT every 6-12 months during first 2 years, then chest x-ray is optional.  E. Abdominal/pelvic CT every 6-12 months for first 2 years  F. Counseling: Reproduction, health habits, psychosocial, cardiovascular, breast self-exam, skin cancer risk, end-of treatment discussion.  G. Surveillance PET scan should not be done routinely due to risk for false positive.  Management decisions should not be based on PET scan alone; clinical or pathologic correlation is needed.   NCCN Guidelines for monitoring for late effects after 5 years  are:  A. H+P annually  B. Annual blood pressure, aggressive management of cardiovascular risk factors  C. Pneumococcal, meningococcal, and H. Flue revaccination after 5-7 years, if patient treated with splenic RT of previous splenectomy  D. Annual influenza vaccine.  E. Cardiovascular symptoms may emerge at a young age   78. Consider stress test/echocardiogram at 10 year intervals after treatment is completed, especially if chest cardiac irradiation.   2. Consider carotid US at 10 year intervals if neck irradiation.  F. Lab studies: CBC diff, CMET annually and TSH at least annually if RT to neck and annual lipids.  G. Consider chest imaging for patients at increased risk for lung cancer.  H. Annual breast screening:  Initiate 8-10 years post-therapy, or at age 43, whichever comes first, if chest or axillary radiation.  The NCCN Hodgkin Lymphoma Guideline s Panel recommends breast MRI in addition to mammography for women who received irradiation to the chest between ages 27 and 30 years, which is consistent with the American Cancer Society Guidelines.  I. Counseling: reproduction, health habits, psychosocial, cardiovascular, breast self-exam, and skin cancer risk.  J. Treatment summary and consideration of transfer to PCP.  K. Consider a referral to a survivorship clinic.   All questions were answered. The patient knows to call the clinic with any problems, questions or concerns. We can certainly see the patient much sooner if necessary.  This document serves as a record of services personally performed by Ancil Linsey, MD. It was created on her behalf by Pearlie Oyster, a trained medical scribe. The creation of this record is based on the scribe's personal observations and the provider's statements to them. This document has been checked and approved by the attending provider.    I have reviewed the above documentation for accuracy and completeness, and I agree with the above.  Kelby Fam.  [place] MD

## 2014-10-30 NOTE — Patient Instructions (Signed)
[place] at [facility] Discharge Instructions  RECOMMENDATIONS MADE BY THE CONSULTANT AND ANY TEST RESULTS WILL BE SENT TO YOUR REFERRING PHYSICIAN.  Exam and discussion by Dr. Whitney Muse. Will re-refer to [facility] Will check some additional labs today.  Information regarding Yoga classes provided. Follow-up in 6 weeks.  Thank you for choosing [place] at [facility] to provide your oncology and hematology care.  To afford each patient quality time with our provider, please arrive at least 15 minutes before your scheduled appointment time.    You need to re-schedule your appointment should you arrive 10 or more minutes late.  We strive to give you quality time with our providers, and arriving late affects you and other patients whose appointments are after yours.  Also, if you no show three or more times for appointments you may be dismissed from the clinic at the providers discretion.     Again, thank you for choosing [facility].  Our hope is that these requests will decrease the amount of time that you wait before being seen by our physicians.       _____________________________________________________________  Should you have questions after your visit to [facility], please contact our office at [phone] between the hours of 8:30 a.m. and 4:30 p.m.  Voicemails left after 4:30 p.m. will not be returned until the following business day.  For prescription refill requests, have your pharmacy contact our office.

## 2014-10-30 NOTE — Progress Notes (Signed)
[name] [name] presented for labwork. Labs per MD order drawn via Peripheral Line 23 gauge needle inserted in left AC  Good blood return present. Procedure without incident.  Needle removed intact. Patient tolerated procedure well.

## 2014-10-31 LAB — T4: T4, Total: 8 ug/dL (ref 4.5–12.0)

## 2014-10-31 LAB — ESTRADIOL: Estradiol: <5 pg/mL

## 2014-10-31 LAB — FOLLICLE STIMULATING HORMONE: FSH: 1.1 m[IU]/mL

## 2014-11-01 ENCOUNTER — Encounter (HOSPITAL_COMMUNITY): Payer: Self-pay | Admitting: Hematology & Oncology

## 2014-11-02 ENCOUNTER — Telehealth (HOSPITAL_COMMUNITY): Payer: Self-pay

## 2014-11-02 ENCOUNTER — Other Ambulatory Visit (HOSPITAL_COMMUNITY): Payer: Self-pay

## 2014-11-02 DIAGNOSIS — E039 Hypothyroidism, unspecified: Secondary | ICD-10-CM

## 2014-11-02 LAB — BETA 2 MICROGLOBULIN, SERUM: Beta-2 Microglobulin: 1.2 mg/L (ref 0.6–2.4)

## 2014-11-02 MED ORDER — LEVOTHYROXINE SODIUM 25 MCG PO TABS: 25.0000 ug | ORAL_TABLET | Freq: Every day | ORAL | Status: DC

## 2014-11-02 NOTE — Telephone Encounter (Signed)
Patient notified, prescription sent to [facility] and lab scheduled for 01/01/15.

## 2014-11-02 NOTE — Telephone Encounter (Signed)
-----   Message from Patrici Ranks, MD sent at 11/01/2014  8:06 AM EDT -----  Start on 25 mcg synthroid daily by mouth. Recheck TSH and FT4 in 8 weeks. Dr.P

## 2014-11-10 ENCOUNTER — Other Ambulatory Visit (HOSPITAL_COMMUNITY): Payer: BLUE CROSS/BLUE SHIELD

## 2014-11-10 ENCOUNTER — Telehealth (HOSPITAL_COMMUNITY): Payer: Self-pay | Admitting: Oncology

## 2014-11-10 NOTE — Telephone Encounter (Signed)
Annual MRI of breast denied by insurance company since she did not receive chest irradiation between the ages of 87-45 years old (as noted in NCCN guidelines).  We will perform annual mammogram and perform MRI of breast with any worrisome findings clinically or radiographically.  Will need to cancel MRI of breast.  Sinan Tuch, PA-C 11/10/2014 11:06 AM

## 2014-11-11 ENCOUNTER — Other Ambulatory Visit (HOSPITAL_COMMUNITY): Payer: BLUE CROSS/BLUE SHIELD

## 2014-11-11 ENCOUNTER — Ambulatory Visit (HOSPITAL_COMMUNITY): Admission: RE | Admit: 2014-11-11 | Payer: BLUE CROSS/BLUE SHIELD | Source: Ambulatory Visit

## 2014-11-16 ENCOUNTER — Telehealth (HOSPITAL_COMMUNITY): Payer: Self-pay

## 2014-11-16 NOTE — Telephone Encounter (Signed)
A user error has taken place: encounter opened in error, closed for administrative reasons.

## 2014-11-16 NOTE — Telephone Encounter (Signed)
-----   Message from Epifanio Lesches sent at 11/16/2014  8:57 AM EDT ----- Contact: [phone] [name] [name] would like for Dr Whitney Muse to increase the dosage of her thyroid med(Levothroide)

## 2014-11-16 NOTE — Telephone Encounter (Signed)
Per Dr. Whitney Muse, patient notified that it takes at least 6 weeks for thyroid medication to begin working and that we would not increase her dosage at this time.  Verbalized understanding of instructions.  Stated "I might have to self medicate to feel better."  Instructed that she should not increase her thyroid medication because of the potential danger of self medicating.  To have levels repeated in July as scheduled.  Verbalized understanding of instructions.

## 2014-11-16 NOTE — Telephone Encounter (Signed)
-----   Message from Epifanio Lesches sent at 11/16/2014  8:57 AM EDT ----- Contact: [phone] [place] would like for Dr Whitney Muse to increase the dosage of her thyroid med(Levothroide)

## 2014-11-16 NOTE — Telephone Encounter (Signed)
States "for about 1 1/2 weeks after I started the thyroid medicine I felt pretty normal.  Since Friday, my energy level has gone way down and all I want to do is sleep.  I'm so tired today I didn't even go to work.  Wanted to know if Dr. Whitney Muse would increase the dosage of my thyroid medication to see if it will help?"

## 2014-12-11 ENCOUNTER — Ambulatory Visit (HOSPITAL_COMMUNITY): Payer: BLUE CROSS/BLUE SHIELD | Admitting: Hematology & Oncology

## 2014-12-29 ENCOUNTER — Other Ambulatory Visit (HOSPITAL_COMMUNITY): Payer: BLUE CROSS/BLUE SHIELD

## 2015-01-01 ENCOUNTER — Other Ambulatory Visit (HOSPITAL_COMMUNITY): Payer: BLUE CROSS/BLUE SHIELD

## 2015-01-05 ENCOUNTER — Ambulatory Visit (HOSPITAL_COMMUNITY): Payer: BLUE CROSS/BLUE SHIELD | Admitting: Hematology & Oncology

## 2015-01-29 ENCOUNTER — Ambulatory Visit (HOSPITAL_COMMUNITY): Payer: BLUE CROSS/BLUE SHIELD

## 2015-03-05 ENCOUNTER — Encounter (HOSPITAL_COMMUNITY): Payer: BLUE CROSS/BLUE SHIELD | Attending: Hematology & Oncology | Admitting: Hematology & Oncology

## 2015-03-05 ENCOUNTER — Encounter (HOSPITAL_COMMUNITY): Payer: Self-pay | Admitting: Hematology & Oncology

## 2015-03-05 VITALS — BP 156/95 | HR 82 | Temp 98.6°F | Resp 16 | Wt 218.2 lb

## 2015-03-05 DIAGNOSIS — R5383 Other fatigue: Secondary | ICD-10-CM | POA: Diagnosis not present

## 2015-03-05 DIAGNOSIS — C8190 Hodgkin lymphoma, unspecified, unspecified site: Secondary | ICD-10-CM | POA: Insufficient documentation

## 2015-03-05 LAB — COMPREHENSIVE METABOLIC PANEL
ALT: 17 U/L (ref 14–54)
AST: 19 U/L (ref 15–41)
Albumin: 4.1 g/dL (ref 3.5–5.0)
Alkaline Phosphatase: 47 U/L (ref 38–126)
Anion gap: 6 (ref 5–15)
BUN: 14 mg/dL (ref 6–20)
CO2: 26 mmol/L (ref 22–32)
Calcium: 8.6 mg/dL — ABNORMAL LOW (ref 8.9–10.3)
Chloride: 106 mmol/L (ref 101–111)
Creatinine, Ser: 0.67 mg/dL (ref 0.44–1.00)
GFR calc Af Amer: >60 mL/min (ref >60)
GFR calc non Af Amer: >60 mL/min (ref >60)
Glucose, Bld: 124 mg/dL — ABNORMAL HIGH (ref 65–99)
Potassium: 3.7 mmol/L (ref 3.5–5.1)
Sodium: 138 mmol/L (ref 135–145)
Total Bilirubin: 0.3 mg/dL (ref 0.3–1.2)
Total Protein: 7 g/dL (ref 6.5–8.1)

## 2015-03-05 LAB — CBC WITH DIFFERENTIAL/PLATELET
Basophils Absolute: 0 10*3/uL (ref 0.0–0.1)
Basophils Relative: 0 %
Eosinophils Absolute: 0.1 10*3/uL (ref 0.0–0.7)
Eosinophils Relative: 2 %
HCT: 37.4 % (ref 36.0–46.0)
Hemoglobin: 13.2 g/dL (ref 12.0–15.0)
Lymphocytes Relative: 17 %
Lymphs Abs: 0.7 10*3/uL (ref 0.7–4.0)
MCH: 33 pg (ref 26.0–34.0)
MCHC: 35.3 g/dL (ref 30.0–36.0)
MCV: 93.5 fL (ref 78.0–100.0)
Monocytes Absolute: 0.3 10*3/uL (ref 0.1–1.0)
Monocytes Relative: 7 %
Neutro Abs: 3.2 10*3/uL (ref 1.7–7.7)
Neutrophils Relative %: 74 %
Platelets: 199 10*3/uL (ref 150–400)
RBC: 4 MIL/uL (ref 3.87–5.11)
RDW: 12.3 % (ref 11.5–15.5)
WBC: 4.3 10*3/uL (ref 4.0–10.5)

## 2015-03-05 LAB — LACTATE DEHYDROGENASE: LDH: 135 U/L (ref 98–192)

## 2015-03-05 NOTE — Patient Instructions (Signed)
..  [place] at [facility] Discharge Instructions  RECOMMENDATIONS MADE BY THE CONSULTANT AND ANY TEST RESULTS WILL BE SENT TO YOUR REFERRING PHYSICIAN. 6 months return  Labs, CT's and Dr. Visit in march.   Thank you for choosing [place] at [facility] to provide your oncology and hematology care.  To afford each patient quality time with our provider, please arrive at least 15 minutes before your scheduled appointment time.    You need to re-schedule your appointment should you arrive 10 or more minutes late.  We strive to give you quality time with our providers, and arriving late affects you and other patients whose appointments are after yours.  Also, if you no show three or more times for appointments you may be dismissed from the clinic at the providers discretion.     Again, thank you for choosing [facility].  Our hope is that these requests will decrease the amount of time that you wait before being seen by our physicians.       _____________________________________________________________  Should you have questions after your visit to [facility], please contact our office at [phone] between the hours of 8:30 a.m. and 4:30 p.m.  Voicemails left after 4:30 p.m. will not be returned until the following business day.  For prescription refill requests, have your pharmacy contact our office.

## 2015-03-05 NOTE — Progress Notes (Signed)
..  [name] [name]'s reason for visit today are for labs as scheduled per MD orders.  Venipuncture performed with a 23 gauge butterfly needle to L Antecubital.  [name] [name] tolerated venipuncture well and without incident; questions were answered and patient was discharged.

## 2015-03-05 NOTE — Progress Notes (Signed)
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 She was then switched to dose adjusted BEACOPP x2 cycles. Her PET scan after the second cycle showed complete remission. Because of probable inflammatory changes in the lungs and a prior Citrobacter blood stream/pulmonary infection felt to be the cause of the PET scan findings in the lungs. Now S/P involved field radiation by Dr. Thea Silversmith ([place]) finishing on 11/20/2012.  [name] [name] is here alone today. Upon entering the room, she states that she is feeling much better than the last time she was here, 'like a completely different person'. She was started on thyroid replacement and notes this has made a world of difference in her fatigue and well being.  [name] [name] has made quite a few lifestyle changes. She has eliminated soda, sweet tea, chips, and junk food from her diet. She buys meat from local farms now. She has been using free weights and doing outdoor activities, like biking and kayaking. She remarks that she is nearly back to where she used to be.  She has not experienced periods since chemotherapy. She takes calcium and Vitamin D daily.  She finished radiation in July. Her last CT scan was done in March.  She has never had a colonoscopy. No family history of colon cancer. Her last pap smear was done on Tuesday, 9/20.  She admits to not performing regular self breast exams. She will be due for a mammogram in December.  Oncology History   stage II A.  bulky mediastinal nodular sclerosing Hodgkin's disease presenting with a large anterior mediastinal mass as well as left cervical and right cervical lymph nodes status post 5 cycles of ABVD but with incomplete response evaluated after cycle 3 and after cycle #5. She was then switched to dose adjusted BEACOPP x2 cycles. Her PET scan after the second cycle showed complete remission. Because of probable inflammatory changes in the lungs and a prior Citrobacter blood stream/pulmonary infection felt to be the cause of the PET scan findings in the lungs she has had Levaquin which she is now finished. Now S/P involved field radiation by Dr. Thea Silversmith ([place]) finishing on 11/20/2012.     Hodgkin's lymphoma   09/14/2010 Imaging Chest xray demonstrating- Possible mediastinal mass as discussed above.  Recommend chest CT scan for further evaluation.  Further work-up by ordering physician never performed   02/26/2012 Imaging CT of chest- Large anterior mediastinal mass is identified and is worrisome for tumor.  This is encasing the great vessels and S causing significant narrowing of the left subclavian vein.   03/05/2012 Bone Marrow Biopsy Negative   03/05/2012 Initial Diagnosis Anterior mediastinal mass biopsy- Classical Hodgkin's lymphoma   03/13/2012 Imaging PET scan- hypermetabolic anterior mediastinal mass, extension of dominant mass in the low left neck, isolated small right lower jugular hypermetabolic node, no subdiaphragmatic disease.   03/27/2012 - 08/07/2012 Chemotherapy ABVD x 5 cycles but with incomplete response evaluated after cycle 3 and after cycle #5.   06/20/2012 Imaging PET scan- interval response to  therapy as evidenced by decrease in size and metabolic activity involving the anterior mediastinal mass   08/16/2012 Imaging PET scan- The anterior mediastinal soft tissue mass shows no interval change in size and there is a persistent small peripheral focus of hypermetabolism within this lesion on  today's exam.   08/28/2012 - 09/25/2012 Chemotherapy BEACOPP x 2 cycles   10/08/2012 Imaging PET scan- Today's study demonstrates a positive response to therapy with decrease in size and resolution of hypermetabolism within the anterior mediastinal nodal mass.  The mass currently measures 6.2 x 2.5 cm.   10/09/2012 Remission    10/23/2012 - 11/20/2012 Radiation Therapy Dr. Wentworth   12/02/2012 Adverse Reaction Radiation recall- negative work-up.  Treated with Prednisone.   05/26/2013 Imaging PET scan- No substantial change in size of the anterior mediastinal mass although there is no hypermetabolism within this lesion on the current study.  Interval decrease in spleen size.   11/12/2013 Imaging CT CAP- decrease size in medistinal mass.  NED.   02/09/2014 PET scan Persistent soft tissue within the anterior mediastinum with very low level metabolic activity (Deauville Criteria 2). Findings likely reflect treated tumor.    06/11/2014 Mammogram Negative   07/01/2014 Imaging Xray of orbits- No evidence of metallic foreign body within the orbits.    Past Medical History  Diagnosis Date  . Fibromyalgia   . Eczema   . Depression     parents died close together "was hard"  . Restless leg   . Sleep apnea     Sleep Study- 1996- "little apnea- no tx"  . Headache(784.0)     Birth control pills help  . Hemorrhoid   . Anemia     As a child and while pregnant  . 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 Hypertension     recently dx with hodgkins  . Dyspnea     all the time .".due to the mass"  . History of radiation therapy 10/28/12-11/20/12    chest & neck,34Gy/17fx  . S/P cholecystectomy 09/14/2013    08/29/2013 by Dr. Jenkins.  Negative pathology    has Fibromyalgia; Eczema; Hypertension; Depression; Restless leg; Hodgkin's lymphoma; Citrobacter infection; S/P cholecystectomy; and Leg edema on her problem list.     is allergic to other and sulfur.  [name] [name] does not  currently have medications on file.   Past Surgical History  Procedure Laterality Date  . Wisdom tooth extraction    . Dilation and curettage of uterus  1991  . Mediastinal mass biopsy  03/05/12  . Bone marrow biopsy  03/20/2012  . Portacath placement  03/26/2012    Procedure: INSERTION PORT-A-CATH;  Surgeon: Steven C Hendrickson, MD;  Location: [facility] OR;  Service: Thoracic;  Laterality: N/A;  . Port-a-cath removal Left 10/16/2012    Procedure: REMOVAL PORT-A-CATH;  Surgeon: Steven C Hendrickson, MD;  Location: [facility] OR;  Service: Thoracic;  Laterality: Left;  . Cholecystectomy N/A 08/29/2013    Procedure: LAPAROSCOPIC CHOLECYSTECTOMY;  Surgeon: Mark A Jenkins, MD;  Location: [facility];  Service: General;  Laterality: N/A;    Denies any headaches, dizziness, double vision, fevers, chills, night sweats, nausea, vomiting, diarrhea, constipation, chest pain, heart palpitations, shortness of breath, blood in stool, black tarry stool, urinary pain, urinary burning, urinary frequency, hematuria. 14 point review of systems was performed and is negative except as detailed under history of present illness and above    PHYSICAL EXAMINATION  ECOG PERFORMANCE STATUS: 0 - Asymptomatic    Filed Vitals:   03/05/15 1341  BP: 156/95  Pulse: 82  Temp: 98.6 F (37 C)  Resp: 16    GENERAL:alert, no distress, well nourished, well developed, comfortable, cooperative, obese SKIN: skin color, texture, turgor are normal, no rashes or significant lesions HEAD: Normocephalic, No masses, lesions, tenderness or abnormalities EYES: normal, PERRLA, EOMI, Conjunctiva are pink and non-injected EARS: External ears normal OROPHARYNX: mucous membranes are moist  NECK: supple, no adenopathy, thyroid normal size, non-tender, without nodularity, no stridor, non-tender, trachea midline LYMPH:  no palpable lymphadenopathy, no hepatosplenomegaly BREAST: red skin from radiation treatments LUNGS: clear to auscultation and  percussion HEART: regular rate & rhythm, no murmurs, no gallops, S1 normal and S2 normal ABDOMEN: abdomen soft, non-tender and normal bowel sounds BACK: Back symmetric, no curvature. EXTREMITIES: less then 2 second capillary refill, no joint deformities, effusion, or inflammation, no skin discoloration, no clubbing, no cyanosis  NEURO: alert & oriented x 3 with fluent speech, no focal motor/sensory deficits, gait normal   LABORATORY DATA: I have reviewed the labs below. CBC    Component Value Date/Time   WBC 4.3 03/05/2015 1448   WBC 2.5* 11/13/2012 1552   RBC 4.00 03/05/2015 1448   RBC 3.04* 11/13/2012 1552   HGB 13.2 03/05/2015 1448   HGB 10.5* 11/13/2012 1552   HCT 37.4 03/05/2015 1448   HCT 29.6* 11/13/2012 1552   PLT 199 03/05/2015 1448   PLT 146 11/13/2012 1552   MCV 93.5 03/05/2015 1448   MCV 97.6 11/13/2012 1552   MCH 33.0 03/05/2015 1448   MCH 34.7* 11/13/2012 1552   MCHC 35.3 03/05/2015 1448   MCHC 35.6 11/13/2012 1552   RDW 12.3 03/05/2015 1448   RDW 16.3* 11/13/2012 1552   LYMPHSABS 0.7 03/05/2015 1448   LYMPHSABS 0.1* 11/13/2012 1552   MONOABS 0.3 03/05/2015 1448   MONOABS 0.4 11/13/2012 1552   EOSABS 0.1 03/05/2015 1448   EOSABS 0.1 11/13/2012 1552   BASOSABS 0.0 03/05/2015 1448   BASOSABS 0.0 11/13/2012 1552      Chemistry      Component Value Date/Time   NA 138 03/05/2015 1448   NA 136 11/13/2012 1552   K 3.7 03/05/2015 1448   K 3.6 11/13/2012 1552   CL 106 03/05/2015 1448   CL 101 11/13/2012 1552   CO2 26 03/05/2015 1448   CO2 27 11/13/2012 1552   BUN 14 03/05/2015 1448   BUN 20.4 11/13/2012 1552   CREATININE 0.67 03/05/2015 1448   CREATININE 1.0 11/13/2012 1552      Component Value Date/Time   CALCIUM 8.6* 03/05/2015 1448   CALCIUM 9.0 11/13/2012 1552   ALKPHOS 47 03/05/2015 1448   ALKPHOS 62 11/13/2012 1552   AST 19 03/05/2015 1448   AST 27 11/13/2012 1552   ALT 17 03/05/2015 1448   ALT 22 11/13/2012 1552   BILITOT 0.3 03/05/2015  1448   BILITOT 0.88 11/13/2012 1552       ASSESSMENT AND PLAN:   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 She is to continue on her levothyroxine and celexa. She has dramatically increased her physical activity.She has no obvious evidence of recurrent lymphoma  The patient was advised to get a flu shot.  I have scheduled her for repeat CT scans in March 2017. She will be due for mammography in December.  I will see her again in 6 months for a routine follow up. At this visit we will discuss  having a bone density scan performed.    THERAPY PLAN:  NCCN guidelines for surveillance of Hodgkin's Lymphoma (Stage I-IV) for first 5 years are as follows:  A. H+P every 3-6 months for years 1-2, then every 6-12 months until year 3, then annually.   B. Annual influenza vaccine.  C. Laboratory studies: CBC diff, ESR (if elevated at time of initial diagnosis), CMET.  TSH at least annually if RT to neck.  D. Chest x-ray or CT every 6-12 months during first 2 years, then chest x-ray is optional.  E. Abdominal/pelvic CT every 6-12 months for first 2 years  F. Counseling: Reproduction, health habits, psychosocial, cardiovascular, breast self-exam, skin cancer risk, end-of treatment discussion.  G. Surveillance PET scan should not be done routinely due to risk for false positive.  Management decisions should not be based on PET scan alone; clinical or pathologic correlation is needed.   NCCN Guidelines for monitoring for late effects after 5 years are:  A. H+P annually  B. Annual blood pressure, aggressive management of cardiovascular risk factors  C. Pneumococcal, meningococcal, and H. Flue revaccination after 5-7 years, if patient treated with splenic RT of previous splenectomy  D. Annual influenza vaccine.  E. Cardiovascular symptoms may emerge at a young age   1. Consider stress test/echocardiogram at 10 year  intervals after treatment is completed, especially if chest cardiac irradiation.   2. Consider carotid US at 10 year intervals if neck irradiation.  F. Lab studies: CBC diff, CMET annually and TSH at least annually if RT to neck and annual lipids.  G. Consider chest imaging for patients at increased risk for lung cancer.  H. Annual breast screening:  Initiate 8-10 years post-therapy, or at age 40, whichever comes first, if chest or axillary radiation.  The NCCN Hodgkin Lymphoma Guideline s Panel recommends breast MRI in addition to mammography for women who received irradiation to the chest between ages 10 and 30 years, which is consistent with the American Cancer Society Guidelines.  I. Counseling: reproduction, health habits, psychosocial, cardiovascular, breast self-exam, and skin cancer risk.  J. Treatment summary and consideration of transfer to PCP.  K. Consider a referral to a survivorship clinic.    All questions were answered. The patient knows to call the clinic with any problems, questions or concerns. We can certainly see the patient much sooner if necessary.  This document serves as a record of services personally performed by  , MD. It was created on her behalf by Elizabeth Ashley, a trained medical scribe. The creation of this record is based on the scribe's personal observations and the provider's statements to them. This document has been checked and approved by the attending provider.  I have reviewed the above documentation for accuracy and completeness, and I agree with the above.   K.  MD 

## 2015-03-06 LAB — TESTOSTERONE: Testosterone: 27 ng/dL (ref 8–48)

## 2015-03-09 LAB — TESTOSTERONE, % FREE: Testosterone-% Free: 0.3 % — ABNORMAL LOW (ref 0.2–0.7)

## 2015-03-15 ENCOUNTER — Telehealth (HOSPITAL_COMMUNITY): Payer: Self-pay

## 2015-03-15 NOTE — Telephone Encounter (Signed)
Call from [place] wanting to know what the significance of the hormone levels that were drawn on 9/23 and if she could be concerned about the drop in her red cell and white cell count?

## 2015-03-16 ENCOUNTER — Telehealth (HOSPITAL_COMMUNITY): Payer: Self-pay

## 2015-03-16 NOTE — Telephone Encounter (Signed)
Call to patient to let her know that per Dr. Whitney Muse, her blood counts are ok and that she is not concerned about them.  May repeat testosterone levels in 6 - 12 months if needed.  Verbalized understandiing.

## 2015-06-17 ENCOUNTER — Ambulatory Visit (HOSPITAL_COMMUNITY): Payer: BLUE CROSS/BLUE SHIELD

## 2015-06-28 ENCOUNTER — Ambulatory Visit (HOSPITAL_COMMUNITY)
Admission: RE | Admit: 2015-06-28 | Discharge: 2015-06-28 | Disposition: A | Discharge status: Home / Self Care | Payer: BLUE CROSS/BLUE SHIELD | Source: Ambulatory Visit | Attending: Oncology | Admitting: Oncology

## 2015-06-28 DIAGNOSIS — Z1231 Encounter for screening mammogram for malignant neoplasm of breast: Secondary | ICD-10-CM | POA: Insufficient documentation

## 2015-06-28 DIAGNOSIS — Z0000 Encounter for general adult medical examination without abnormal findings: Secondary | ICD-10-CM

## 2015-08-25 ENCOUNTER — Other Ambulatory Visit (HOSPITAL_COMMUNITY): Payer: Self-pay | Admitting: Oncology

## 2015-08-25 DIAGNOSIS — E875 Hyperkalemia: Secondary | ICD-10-CM

## 2015-08-26 ENCOUNTER — Ambulatory Visit (HOSPITAL_COMMUNITY)
Admission: RE | Admit: 2015-08-26 | Discharge: 2015-08-26 | Disposition: A | Discharge status: Home / Self Care | Payer: Commercial Managed Care - PPO | Source: Ambulatory Visit | Attending: Hematology & Oncology | Admitting: Hematology & Oncology

## 2015-08-26 DIAGNOSIS — C8191 Hodgkin lymphoma, unspecified, lymph nodes of head, face, and neck: Secondary | ICD-10-CM | POA: Insufficient documentation

## 2015-08-26 DIAGNOSIS — Z9049 Acquired absence of other specified parts of digestive tract: Secondary | ICD-10-CM | POA: Insufficient documentation

## 2015-08-26 DIAGNOSIS — C8190 Hodgkin lymphoma, unspecified, unspecified site: Secondary | ICD-10-CM

## 2015-08-26 MED ORDER — IOHEXOL 300 MG/ML  SOLN
100.0000 mL | Freq: Once | INTRAMUSCULAR | Status: AC | PRN
  Administered 2015-08-26: 100 mL via INTRAVENOUS

## 2015-09-03 ENCOUNTER — Encounter (HOSPITAL_COMMUNITY): Payer: Self-pay | Admitting: Hematology & Oncology

## 2015-09-03 ENCOUNTER — Encounter (HOSPITAL_COMMUNITY): Payer: Commercial Managed Care - PPO | Attending: Hematology & Oncology | Admitting: Hematology & Oncology

## 2015-09-03 ENCOUNTER — Other Ambulatory Visit (HOSPITAL_COMMUNITY): Payer: BLUE CROSS/BLUE SHIELD

## 2015-09-03 VITALS — BP 162/95 | HR 103 | Temp 97.8°F | Resp 18 | Wt 224.2 lb

## 2015-09-03 DIAGNOSIS — R5383 Other fatigue: Secondary | ICD-10-CM

## 2015-09-03 DIAGNOSIS — C8110 Nodular sclerosis classical Hodgkin lymphoma, unspecified site: Secondary | ICD-10-CM

## 2015-09-03 DIAGNOSIS — E039 Hypothyroidism, unspecified: Secondary | ICD-10-CM

## 2015-09-03 DIAGNOSIS — C8190 Hodgkin lymphoma, unspecified, unspecified site: Secondary | ICD-10-CM

## 2015-09-03 NOTE — Patient Instructions (Addendum)
[place] at [facility] Discharge Instructions  RECOMMENDATIONS MADE BY THE CONSULTANT AND ANY TEST RESULTS WILL BE SENT TO YOUR REFERRING PHYSICIAN.   Exam and discussion by Dr Whitney Muse today Return to see the doctor in 6 months with labs  Please call the clinic if you have any questions or concerns    Thank you for choosing [place] at [facility] to provide your oncology and hematology care.  To afford each patient quality time with our provider, please arrive at least 15 minutes before your scheduled appointment time.   Beginning January 23rd 2017 lab work for the [facility] will be done in the  Main lab at [facility] on 1st floor. If you have a lab appointment with the [facility] please come in thru the  Main Entrance and check in at the main information desk  You need to re-schedule your appointment should you arrive 10 or more minutes late.  We strive to give you quality time with our providers, and arriving late affects you and other patients whose appointments are after yours.  Also, if you no show three or more times for appointments you may be dismissed from the clinic at the providers discretion.     Again, thank you for choosing [facility].  Our hope is that these requests will decrease the amount of time that you wait before being seen by our physicians.       _____________________________________________________________  Should you have questions after your visit to [facility], please contact our office at [phone] between the hours of 8:30 a.m. and 4:30 p.m.  Voicemails left after 4:30 p.m. will not be returned until the following business day.  For prescription refill requests, have your pharmacy contact our office.         Resources For Cancer Patients and their Caregivers ? American Cancer Society: Can assist with transportation, wigs, general needs, runs Look Good Feel Better.         (412) 252-7818 ? Cancer Care: Provides financial assistance, online support groups, medication/co-pay assistance.  1-800-813-HOPE 865-246-1264) ? [place] Assists [place] cancer patients and their families through emotional , educational and financial support.  604-182-4640 ? [place] DSS Where to apply for food stamps, Medicaid and utility assistance. 7142343925 ? [facility]: Transportation to medical appointments. (647) 472-7008 ? Social Security Administration: May apply for disability if have a Stage IV cancer. (218)548-2705 (717)176-2652 ? [employer], Disability and Transit Services: Assists with nutrition, care and transit needs. 639-545-9207

## 2015-09-03 NOTE — Progress Notes (Signed)
[facility] [place], MD 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 She was then switched to dose adjusted BEACOPP x2 cycles. Her PET scan after the second cycle showed complete remission. Because of probable inflammatory changes in the lungs and a prior Citrobacter blood stream/pulmonary infection felt to be the cause of the PET scan findings in the lungs. Now S/P involved field radiation by Dr. Thea Silversmith ([place]) finishing on 11/20/2012.  [name] [name] returns to the [facility] alone today. She is here to review CT imaging.   She says she's feeling pretty good aside from the fact that she can't get her weight down.  Other than this, she confirms that she is staying active and doing well. She says she does Zumba twice a week, yoga once a week, and rides bikes on the weekends.   She says she's had her mammogram here about 3 weeks ago. She is up to date on everything, as she doesn't need a colonoscopy for a few more years.  She was diagnosed in October of 2013, finished chemo in April of 2014, and finished radiation in July of 2014.    NCCN guidelines for ongoing observation were reviewed. She says she would feel better to follow up every 6 months with bloodwork.  She has seen a cardiologist, a year ago. 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 She was then switched to dose adjusted BEACOPP x2 cycles. Her PET scan after the second cycle showed complete remission. Because of probable inflammatory changes in the lungs and a prior Citrobacter blood stream/pulmonary infection felt to be the cause of the PET scan findings in the lungs she has had Levaquin which she is now finished. Now S/P involved field radiation by Dr. Thea Silversmith ([place]) finishing on 11/20/2012.     Hodgkin's lymphoma ([place])   09/14/2010 Imaging Chest xray demonstrating- Possible mediastinal mass as discussed above.  Recommend chest CT scan for further evaluation.  Further work-up by ordering physician never performed   02/26/2012 Imaging CT of chest- Large anterior mediastinal mass is identified and is worrisome for tumor.  This is encasing the great vessels and S causing significant narrowing of the left subclavian vein.   03/05/2012 Bone Marrow Biopsy Negative   03/05/2012 Initial Diagnosis Anterior mediastinal mass biopsy- Classical Hodgkin's lymphoma   03/13/2012 Imaging PET scan- hypermetabolic anterior mediastinal mass, extension of dominant mass in the low left neck, isolated small right lower jugular hypermetabolic node, no subdiaphragmatic disease.   03/27/2012 - 08/07/2012 Chemotherapy ABVD x 5 cycles but with incomplete response evaluated after cycle 3 and after cycle #5.   06/20/2012 Imaging PET scan- interval response to therapy  as evidenced by decrease in size and metabolic activity involving the anterior mediastinal mass   08/16/2012 Imaging PET scan- The anterior mediastinal soft tissue mass shows no interval change in size and there is a persistent small peripheral focus of hypermetabolism within this lesion on today's exam.   08/28/2012 - 09/25/2012 Chemotherapy BEACOPP x 2 cycles    10/08/2012 Imaging PET scan- Today's study demonstrates a positive response to therapy with decrease in size and resolution of hypermetabolism within the anterior mediastinal nodal mass.  The mass currently measures 6.2 x 2.5 cm.   10/09/2012 Remission    10/23/2012 - 11/20/2012 Radiation Therapy Dr. Pablo Ledger   12/02/2012 Adverse Reaction Radiation recall- negative work-up.  Treated with Prednisone.   05/26/2013 Imaging PET scan- No substantial change in size of the anterior mediastinal mass although there is no hypermetabolism within this lesion on the current study.  Interval decrease in spleen size.   11/12/2013 Imaging CT CAP- decrease size in medistinal mass.  NED.   02/09/2014 PET scan Persistent soft tissue within the anterior mediastinum with very low level metabolic activity (Deauville Criteria 2). Findings likely reflect treated tumor.    06/11/2014 Mammogram Negative   07/01/2014 Imaging Xray of orbits- No evidence of metallic foreign body within the orbits.   08/27/2015 Imaging CT C/A/P Further decreased size of sessile appearing nodal lesion in the anterior mediastinum compared to prior study. no findings to suggest recurrent disease in the chest abdomen or pelvis    Past Medical History  Diagnosis Date  . Fibromyalgia   . Eczema   . Depression     parents died close together "was hard"  . Restless leg   . Sleep apnea     Sleep Study- 1996- "little apnea- no tx"  . Headache(784.0)     Birth control pills help  . Hemorrhoid   . Anemia     As a child and while pregnant  . Cancer ([place])     IIA Hodgkin's disease classic nodular sclerosing type  . Hypertension     recently dx with hodgkins  . Dyspnea     all the time .".due to the mass"  . History of radiation therapy 10/28/12-11/20/12    chest & neck,34Gy/34f  . S/P cholecystectomy 09/14/2013    08/29/2013 by Dr. JArnoldo Morale  Negative pathology    has Fibromyalgia; Eczema; Hypertension; Depression; Restless leg; Hodgkin's lymphoma  ([place]; Citrobacter infection; S/P cholecystectomy; and Leg edema on her problem list.     is allergic to other and sulfur.  Ms. TKingbirdhad no medications administered during this visit.   Past Surgical History  Procedure Laterality Date  . Wisdom tooth extraction    . Dilation and curettage of uterus  1991  . Mediastinal mass biopsy  03/05/12  . Bone marrow biopsy  03/20/2012  . Portacath placement  03/26/2012    Procedure: INSERTION PORT-A-CATH;  Surgeon: SMelrose Nakayama MD;  Location: [facility]  Service: Thoracic;  Laterality: N/A;  . Port-a-cath removal Left 10/16/2012    Procedure: REMOVAL PORT-A-CATH;  Surgeon: SMelrose Nakayama MD;  Location: [facility]  Service: Thoracic;  Laterality: Left;  . Cholecystectomy N/A 08/29/2013    Procedure: LAPAROSCOPIC CHOLECYSTECTOMY;  Surgeon: MJamesetta So MD;  Location: [facility];  Service: General;  Laterality: N/A;    Denies any headaches, dizziness, double vision, fevers, chills, night sweats, nausea, vomiting, diarrhea, constipation, chest pain, heart palpitations, shortness of breath, blood in stool, black tarry stool, urinary pain, urinary burning, urinary  frequency, hematuria. 14 point review of systems was performed and is negative except as detailed under history of present illness and above   PHYSICAL EXAMINATION  ECOG PERFORMANCE STATUS: 0 - Asymptomatic  Filed Vitals:   09/03/15 1354  BP: 162/95  Pulse: 103  Temp: 97.8 F (36.6 C)  Resp: 18    GENERAL:alert, no distress, well nourished, well developed, comfortable, cooperative, obese SKIN: skin color, texture, turgor are normal, no rashes or significant lesions HEAD: Normocephalic, No masses, lesions, tenderness or abnormalities EYES: normal, PERRLA, EOMI, Conjunctiva are pink and non-injected EARS: External ears normal OROPHARYNX: mucous membranes are moist  NECK: supple, no adenopathy, thyroid normal size, non-tender, without nodularity, no stridor, non-tender,  trachea midline LYMPH:  no palpable lymphadenopathy, no hepatosplenomegaly BREAST: red skin from radiation treatments LUNGS: clear to auscultation and percussion HEART: regular rate & rhythm, no murmurs, no gallops, S1 normal and S2 normal ABDOMEN: abdomen soft, non-tender and normal bowel sounds BACK: Back symmetric, no curvature. EXTREMITIES: less then 2 second capillary refill, no joint deformities, effusion, or inflammation, no skin discoloration, no clubbing, no cyanosis  NEURO: alert & oriented x 3 with fluent speech, no focal motor/sensory deficits, gait normal   LABORATORY DATA: I have reviewed the labs below.  Results for [name] [name], [name] [name] (MRN [id number]) as of 09/03/2015 16:28  Ref. Range 03/05/2015 14:48  Sodium Latest Ref Range: 135-145 mmol/L 138  Potassium Latest Ref Range: 3.5-5.1 mmol/L 3.7  Chloride Latest Ref Range: 101-111 mmol/L 106  CO2 Latest Ref Range: 22-32 mmol/L 26  BUN Latest Ref Range: 6-20 mg/dL 14  Creatinine Latest Ref Range: 0.44-1.00 mg/dL 0.67  Calcium Latest Ref Range: 8.9-10.3 mg/dL 8.6 (L)  EGFR (Non-African Amer.) Latest Ref Range: >60 mL/min >60  EGFR (African American) Latest Ref Range: >60 mL/min >60  Glucose Latest Ref Range: 65-99 mg/dL 124 (H)  Anion gap Latest Ref Range: 5-15  6  Alkaline Phosphatase Latest Ref Range: 38-126 U/L 47  Albumin Latest Ref Range: 3.5-5.0 g/dL 4.1  AST Latest Ref Range: 15-41 U/L 19  ALT Latest Ref Range: 14-54 U/L 17  Total Protein Latest Ref Range: 6.5-8.1 g/dL 7.0  Total Bilirubin Latest Ref Range: 0.3-1.2 mg/dL 0.3  LDH Latest Ref Range: 98-192 U/L 135  WBC Latest Ref Range: 4.0-10.5 K/uL 4.3  RBC Latest Ref Range: 3.87-5.11 MIL/uL 4.00  Hemoglobin Latest Ref Range: 12.0-15.0 g/dL 13.2  HCT Latest Ref Range: 36.0-46.0 % 37.4  MCV Latest Ref Range: 78.0-100.0 fL 93.5  MCH Latest Ref Range: 26.0-34.0 pg 33.0  MCHC Latest Ref Range: 30.0-36.0 g/dL 35.3  RDW Latest Ref Range: 11.5-15.5 % 12.3  Platelets  Latest Ref Range: 150-400 K/uL 199  Neutrophils Latest Units: % 74  Lymphocytes Latest Units: % 17  Monocytes Relative Latest Units: % 7  Eosinophil Latest Units: % 2  Basophil Latest Units: % 0  NEUT# Latest Ref Range: 1.7-7.7 K/uL 3.2  Lymphocyte # Latest Ref Range: 0.7-4.0 K/uL 0.7  Monocyte # Latest Ref Range: 0.1-1.0 K/uL 0.3  Eosinophils Absolute Latest Ref Range: 0.0-0.7 K/uL 0.1  Basophils Absolute Latest Ref Range: 0.0-0.1 K/uL 0.0  Testosterone Latest Ref Range: 8-48 ng/dL 27  Testosterone-% Free Latest Ref Range: 0.2-0.7 % 0.3 (L)    ASSESSMENT AND PLAN:   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 She is to continue on her current medications. She has dramatically increased her physical activity.She has no obvious evidence of recurrent lymphoma  She  is up to date on well care.   I will see her again in 6 months for a routine follow up.    THERAPY PLAN:  NCCN guidelines for surveillance of Hodgkin's Lymphoma (Stage I-IV) for first 5 years are as follows:  A. H+P every 3-6 months for years 1-2, then every 6-12 months until year 3, then annually.   B. Annual influenza vaccine.  C. Laboratory studies: CBC diff, ESR (if elevated at time of initial diagnosis), CMET.  TSH at least annually if RT to neck.  D. Chest x-ray or CT every 6-12 months during first 2 years, then chest x-ray is optional.  E. Abdominal/pelvic CT every 6-12 months for first 2 years  F. Counseling: Reproduction, health habits, psychosocial, cardiovascular, breast self-exam, skin cancer risk, end-of treatment discussion.  G. Surveillance PET scan should not be done routinely due to risk for false positive.  Management decisions should not be based on PET scan alone; clinical or pathologic correlation is needed.   NCCN Guidelines for monitoring for late effects after 5 years are:  A. H+P annually  B. Annual blood pressure,  aggressive management of cardiovascular risk factors  C. Pneumococcal, meningococcal, and H. Flue revaccination after 5-7 years, if patient treated with splenic RT of previous splenectomy  D. Annual influenza vaccine.  E. Cardiovascular symptoms may emerge at a young age   62. Consider stress test/echocardiogram at 10 year intervals after treatment is completed, especially if chest cardiac irradiation.   2. Consider carotid US at 10 year intervals if neck irradiation.  F. Lab studies: CBC diff, CMET annually and TSH at least annually if RT to neck and annual lipids.  G. Consider chest imaging for patients at increased risk for lung cancer.  H. Annual breast screening:  Initiate 8-10 years post-therapy, or at age 87, whichever comes first, if chest or axillary radiation.  The NCCN Hodgkin Lymphoma Guideline s Panel recommends breast MRI in addition to mammography for women who received irradiation to the chest between ages 30 and 30 years, which is consistent with the American Cancer Society Guidelines.  I. Counseling: reproduction, health habits, psychosocial, cardiovascular, breast self-exam, and skin cancer risk.  J. Treatment summary and consideration of transfer to PCP.  K. Consider a referral to a survivorship clinic.    Orders Placed This Encounter  Procedures  . CBC with Differential    Standing Status: Future     Number of Occurrences:      Standing Expiration Date: 09/02/2016  . Comprehensive metabolic panel    Standing Status: Future     Number of Occurrences:      Standing Expiration Date: 09/02/2016  . Lactate dehydrogenase    Standing Status: Future     Number of Occurrences:      Standing Expiration Date: 09/02/2016   All questions were answered. The patient knows to call the clinic with any problems, questions or concerns. We can certainly see the patient much sooner if necessary.  This document serves as a record of services personally performed by Ancil Linsey, MD. It  was created on her behalf by Toni Amend, a trained medical scribe. The creation of this record is based on the scribe's personal observations and the provider's statements to them. This document has been checked and approved by the attending provider.  I have reviewed the above documentation for accuracy and completeness, and I agree with the above.  Kelby Fam. Penland MD

## 2015-11-11 ENCOUNTER — Encounter (HOSPITAL_COMMUNITY): Payer: Self-pay | Admitting: Hematology & Oncology

## 2015-11-11 HISTORY — PX: CARDIOVASCULAR STRESS TEST: SHX262

## 2015-12-16 ENCOUNTER — Ambulatory Visit (HOSPITAL_COMMUNITY)
Admission: RE | Admit: 2015-12-16 | Discharge: 2015-12-16 | Disposition: A | Discharge status: Home / Self Care | Payer: Commercial Managed Care - PPO | Source: Ambulatory Visit | Attending: Internal Medicine | Admitting: Internal Medicine

## 2015-12-16 ENCOUNTER — Encounter (HOSPITAL_COMMUNITY): Payer: Self-pay | Admitting: Internal Medicine

## 2015-12-16 VITALS — BP 148/88 | HR 91 | Wt 224.8 lb

## 2015-12-16 DIAGNOSIS — F329 Major depressive disorder, single episode, unspecified: Secondary | ICD-10-CM | POA: Diagnosis not present

## 2015-12-16 DIAGNOSIS — Z923 Personal history of irradiation: Secondary | ICD-10-CM | POA: Insufficient documentation

## 2015-12-16 DIAGNOSIS — Z882 Allergy status to sulfonamides status: Secondary | ICD-10-CM | POA: Insufficient documentation

## 2015-12-16 DIAGNOSIS — R0683 Snoring: Secondary | ICD-10-CM

## 2015-12-16 DIAGNOSIS — M797 Fibromyalgia: Secondary | ICD-10-CM | POA: Insufficient documentation

## 2015-12-16 DIAGNOSIS — Z8571 Personal history of Hodgkin lymphoma: Secondary | ICD-10-CM | POA: Insufficient documentation

## 2015-12-16 DIAGNOSIS — I5032 Chronic diastolic (congestive) heart failure: Secondary | ICD-10-CM | POA: Diagnosis present

## 2015-12-16 DIAGNOSIS — G2581 Restless legs syndrome: Secondary | ICD-10-CM | POA: Diagnosis not present

## 2015-12-16 DIAGNOSIS — R635 Abnormal weight gain: Secondary | ICD-10-CM | POA: Diagnosis not present

## 2015-12-16 DIAGNOSIS — Z9221 Personal history of antineoplastic chemotherapy: Secondary | ICD-10-CM | POA: Diagnosis not present

## 2015-12-16 DIAGNOSIS — I10 Essential (primary) hypertension: Secondary | ICD-10-CM | POA: Diagnosis not present

## 2015-12-16 DIAGNOSIS — R0609 Other forms of dyspnea: Secondary | ICD-10-CM | POA: Diagnosis not present

## 2015-12-16 DIAGNOSIS — I110 Hypertensive heart disease with heart failure: Secondary | ICD-10-CM | POA: Insufficient documentation

## 2015-12-16 DIAGNOSIS — Z79899 Other long term (current) drug therapy: Secondary | ICD-10-CM | POA: Insufficient documentation

## 2015-12-16 DIAGNOSIS — R0600 Dyspnea, unspecified: Secondary | ICD-10-CM

## 2015-12-16 MED ORDER — HYDROCHLOROTHIAZIDE 12.5 MG PO TABS: 12.5000 mg | ORAL_TABLET | Freq: Every day | ORAL | Status: DC

## 2015-12-16 NOTE — Progress Notes (Addendum)
Patient ID: [name] [name], female   DOB: 17-Oct-1970, 45 y.o.   MRN: [id number]  Primary Care: Dr. 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   Has been treated with cycles of ABVD (Adriamycin, Bleomycin, Vinblastine, Dacarbazine) but with incomplete response both after cycles 3 and cycles 5 and was switched to dose adjusted BEACOPP (Bleomycin, Etoposide, Doxorubcin, Cyclophoshamide, Vincristine, Procarbazine, Predinsone) starting on 08/28/2012. Finished treatment June 2014. Had radiation to her chest.   She presents today for add on for SOB.  Last seen in 01/2014. Has been feeling "rotten", fatigue and SOB over the past 4 months. Has been having edema in her feet, ankles, and hands.  She can walk about 1 mile on flat ground without getting SOB. She does get SOB and fatigue uphill or a flight of steps.  No CP. Has "heaviness" in her chest when lying down flat or when she gets SOB.  + Bendopnea. Had Dr Nevada Crane run blood work for this and noted CRP-Cardiac 14.51 (normal 0.00 - 3.00) putting her at "High Relative Risk for future cardiovascular event".  Very fatigued. + snoring. Has sleep study 18 years ago with mild OSA. Has gained 25 pound in 2 years.  CT chest 3/17 no coronary calcification.  Labs 07/12/15 K 5.8, Creatinine 0.76, TSH 0.342 (low), T4 and T3 normal 07/27/15 K 4.9, Creatinine 0.76 10/25/15 K 5.4, Creatinine 0.86, TSH 0.912 11/09/15 K 4.8, Creatinine 0.85, CRP-Cardiac 14.51, ANA negative. ESR 6 (WNL) 12/01/15 Negative Sjogren's Antibody, Normal PTH    ECHOs (08/2012): EF 65-70%, grade I DD (01/28/2014): EF 60-65%, grade I DD, normal filling pressures, GS -19.9, lateral s' 11.8   Past Medical History  Diagnosis Date  .  Fibromyalgia   . Eczema   . Depression     parents died close together "was hard"  . Restless leg   . Sleep apnea     Sleep Study- 1996- "little apnea- no tx"  . Headache(784.0)     Birth control pills help  . Hemorrhoid   . Anemia     As a child and while pregnant  . Cancer ([place])     IIA Hodgkin's disease classic nodular sclerosing type  . Hypertension     recently dx with hodgkins  . Dyspnea     all the time .".due to the mass"  . History of radiation therapy 10/28/12-11/20/12    chest & neck,34Gy/48f  . S/P cholecystectomy 09/14/2013    08/29/2013 by Dr. JArnoldo Morale  Negative pathology    Current Outpatient Prescriptions  Medication Sig Dispense Refill  . B Complex-C-E-Zn (B COMPLEX-C-E-ZINC) tablet Take 1 tablet by mouth daily. Reported on 09/03/2015    . Calcium Carbonate (CALCIUM 600 PO) Take 1 capsule by mouth daily. Reported on 09/03/2015    . cholecalciferol (VITAMIN D) 1000 UNITS tablet Take 1,000 Units by mouth daily. Reported on 09/03/2015    . Cyanocobalamin (VITAMIN B-12 PO) Take 1 capsule by mouth daily.    . Ibuprofen-Diphenhydramine Cit (ADVIL PM PO) Take 2 tablets by mouth as needed.    .Lenda KelpFE 1/20 1-20 MG-MCG tablet Take 1 tablet by mouth daily.    .Marland KitchenKRILL OIL PO Take 1 capsule by mouth daily.    .Marland Kitchenlevothyroxine (SYNTHROID,  LEVOTHROID) 125 MCG tablet Take 125 mcg by mouth daily before breakfast.    . MAGNESIUM PO Take 450 mg by mouth daily.    . Multiple Vitamins-Minerals (ANTIOXIDANT FORMULA) CAPS Take 1 capsule by mouth daily. Reported on 09/03/2015    . Multiple Vitamins-Minerals (HAIR/SKIN/NAILS/BIOTIN PO) Take 1 capsule by mouth daily. Reported on 09/03/2015    . piroxicam (FELDENE) 20 MG capsule Take 1 capsule by mouth daily as needed.    . temazepam (RESTORIL) 15 MG capsule Take 15 mg by mouth at bedtime as needed for sleep.    Marland Kitchen tiZANidine (ZANAFLEX) 4 MG tablet Take 4 mg by mouth at bedtime.     No current facility-administered medications for this  encounter.   Facility-Administered Medications Ordered in Other Encounters  Medication Dose Route Frequency Provider Last Rate Last Dose  . sodium chloride 0.9 % injection 10 mL  10 mL Intravenous PRN Everardo All, MD   10 mL at 10/03/12 1643    Allergies  Allergen Reactions  . Other Other (See Comments)    Patient can not have straight oxygen due to reaction with chemo medications.   Marland Kitchen Sulfur Anaphylaxis      Social History   Social History  . Marital Status: Married    Spouse Name: N/A  . Number of Children: N/A  . Years of Education: N/A   Occupational History  . Not on file.   Social History Main Topics  . Smoking status: Never Smoker   . Smokeless tobacco: Never Used  . Alcohol Use: 0.0 oz/week    0 drink(s) per week     Comment: occasionaly 2 x month; rarely  . Drug Use: No  . Sexual Activity: Yes    Birth Control/ Protection: Pill   Other Topics Concern  . 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 Lives in [place] with husband.       Family History  Problem Relation Age of Onset  . Cancer Paternal Uncle   . Cancer Maternal Grandmother     lung ca mets to throat    Filed Vitals:   12/16/15 1430  BP: 148/88  Pulse: 91  Weight: 224 lb 12 oz (101.946 kg)  SpO2: 99%   Wt Readings from Last 3 Encounters:  12/16/15 224 lb 12 oz (101.946 kg)  09/03/15 224 lb 3.2 oz (101.696 kg)  03/05/15 218 lb 3.2 oz (98.975 kg)     PHYSICAL EXAM: General:  Well appearing. No respiratory difficulty HEENT: normal Neck: supple. no JVD. Carotids 2+ bilat; no bruits. No thyromegaly or nodule noted. Cor: PMI nondisplaced. RRR. No rubs, gallops or murmurs. Lungs: CTAB, normal effort Abdomen: obese soft, NT, ND, no HSM. No bruits or masses. +BS  Extremities: no cyanosis, clubbing, rash, edema Neuro: alert & oriented x 3, cranial nerves grossly intact. moves all 4 extremities w/o difficulty. Flat affect  ASSESSMENT &  PLAN:  1) Exertional dyspnea - Echo 2 years echo with normal EF and no evidence of chemotoxicity (had h/o Hodgkin's lymphoma treated with adriamycin) - Suspect symptoms due to weight gain and mild volume overload.  - Will repeat echo. Start HCTZ 12.5 (Would like to use spiro, but patient has had mild hyperkalemia dating back to January of this year.) 2) Depression - As previously, some of her fatigue may be related to depression. Should discuss further with PCP 3) Snoring - Had sleep study ~20 years ago.  Will repeat.  Satira Mccallum  [place] PA-C 2:43 PM  Patient seen and examined with Oda Kilts, PA-C. We discussed all aspects of the encounter. I agree with the assessment and plan as stated above.   She continue to describe NYHA III symptoms but objective findings do not coincide so far. She does have mild volume overload and has gained a significant amount of weight in past year. Will start HCTZ 12.5 and also get stress echo and sleep study to further evaluate fatigue and exertional dyspnea  RTC 6 months   Gabreille Dardis,MD 3:38 PM

## 2015-12-16 NOTE — Patient Instructions (Signed)
Start Hydrochlorothiazide 12.5mg  daily.  Your provider requests you have a stress echo.   Follow up with Dr.Bensimhon in 6 months.

## 2015-12-17 DIAGNOSIS — R0609 Other forms of dyspnea: Secondary | ICD-10-CM | POA: Insufficient documentation

## 2015-12-17 DIAGNOSIS — R0600 Dyspnea, unspecified: Secondary | ICD-10-CM | POA: Insufficient documentation

## 2015-12-30 ENCOUNTER — Ambulatory Visit (HOSPITAL_COMMUNITY)
Admission: RE | Admit: 2015-12-30 | Discharge: 2015-12-30 | Disposition: A | Discharge status: Home / Self Care | Payer: Commercial Managed Care - PPO | Source: Ambulatory Visit | Attending: Internal Medicine | Admitting: Internal Medicine

## 2015-12-30 ENCOUNTER — Other Ambulatory Visit (HOSPITAL_COMMUNITY): Payer: Self-pay | Admitting: Internal Medicine

## 2015-12-30 DIAGNOSIS — R1011 Right upper quadrant pain: Secondary | ICD-10-CM | POA: Insufficient documentation

## 2015-12-30 MED ORDER — DIATRIZOATE MEGLUMINE & SODIUM 66-10 % PO SOLN
ORAL | Status: AC
  Filled 2015-12-30: qty 30

## 2015-12-30 MED ORDER — IOPAMIDOL (ISOVUE-300) INJECTION 61%
100.0000 mL | Freq: Once | INTRAVENOUS | Status: AC | PRN
  Administered 2015-12-30: 100 mL via INTRAVENOUS

## 2016-01-03 ENCOUNTER — Encounter: Payer: Self-pay | Admitting: Internal Medicine

## 2016-01-03 ENCOUNTER — Telehealth (HOSPITAL_COMMUNITY): Payer: Self-pay | Admitting: *Deleted

## 2016-01-03 NOTE — Telephone Encounter (Signed)
Patient given detailed instructions per Stress Test Requisition Sheet for test on 01/05/16 at 2:30.Patient Notified to arrive 30 minutes early, and that it is imperative to arrive on time for appointment to keep from having the test rescheduled.  Patient verbalized understanding. [name] [name]

## 2016-01-04 ENCOUNTER — Telehealth (HOSPITAL_COMMUNITY): Payer: Self-pay | Admitting: *Deleted

## 2016-01-06 ENCOUNTER — Ambulatory Visit (HOSPITAL_BASED_OUTPATIENT_CLINIC_OR_DEPARTMENT_OTHER): Payer: Commercial Managed Care - PPO

## 2016-01-06 ENCOUNTER — Ambulatory Visit (HOSPITAL_COMMUNITY): Payer: Commercial Managed Care - PPO | Attending: Cardiology

## 2016-01-06 DIAGNOSIS — I5042 Chronic combined systolic (congestive) and diastolic (congestive) heart failure: Secondary | ICD-10-CM

## 2016-01-06 DIAGNOSIS — G4733 Obstructive sleep apnea (adult) (pediatric): Secondary | ICD-10-CM | POA: Insufficient documentation

## 2016-01-06 DIAGNOSIS — I10 Essential (primary) hypertension: Secondary | ICD-10-CM | POA: Insufficient documentation

## 2016-01-06 DIAGNOSIS — Z09 Encounter for follow-up examination after completed treatment for conditions other than malignant neoplasm: Secondary | ICD-10-CM | POA: Diagnosis present

## 2016-01-06 DIAGNOSIS — R0989 Other specified symptoms and signs involving the circulatory and respiratory systems: Secondary | ICD-10-CM

## 2016-01-10 ENCOUNTER — Emergency Department (HOSPITAL_COMMUNITY)
Admission: EM | Admit: 2016-01-10 | Discharge: 2016-01-10 | Disposition: A | Discharge status: Home / Self Care | Payer: Commercial Managed Care - PPO | Source: Home / Self Care | Attending: Emergency Medicine | Admitting: Emergency Medicine

## 2016-01-10 ENCOUNTER — Telehealth: Payer: Self-pay | Admitting: Gastroenterology

## 2016-01-10 ENCOUNTER — Encounter (HOSPITAL_COMMUNITY): Payer: Self-pay | Admitting: Emergency Medicine

## 2016-01-10 ENCOUNTER — Emergency Department (HOSPITAL_COMMUNITY): Payer: Commercial Managed Care - PPO

## 2016-01-10 DIAGNOSIS — Z8571 Personal history of Hodgkin lymphoma: Secondary | ICD-10-CM

## 2016-01-10 DIAGNOSIS — Z9049 Acquired absence of other specified parts of digestive tract: Secondary | ICD-10-CM | POA: Insufficient documentation

## 2016-01-10 DIAGNOSIS — Z79899 Other long term (current) drug therapy: Secondary | ICD-10-CM

## 2016-01-10 DIAGNOSIS — Z791 Long term (current) use of non-steroidal anti-inflammatories (NSAID): Secondary | ICD-10-CM

## 2016-01-10 DIAGNOSIS — R17 Unspecified jaundice: Secondary | ICD-10-CM

## 2016-01-10 DIAGNOSIS — I5032 Chronic diastolic (congestive) heart failure: Secondary | ICD-10-CM

## 2016-01-10 DIAGNOSIS — R1013 Epigastric pain: Secondary | ICD-10-CM

## 2016-01-10 DIAGNOSIS — K8071 Calculus of gallbladder and bile duct without cholecystitis with obstruction: Secondary | ICD-10-CM | POA: Diagnosis not present

## 2016-01-10 DIAGNOSIS — I110 Hypertensive heart disease with heart failure: Secondary | ICD-10-CM

## 2016-01-10 LAB — URINE MICROSCOPIC-ADD ON

## 2016-01-10 LAB — COMPREHENSIVE METABOLIC PANEL
ALT: 403 U/L — ABNORMAL HIGH (ref 14–54)
AST: 230 U/L — ABNORMAL HIGH (ref 15–41)
Albumin: 4.4 g/dL (ref 3.5–5.0)
Alkaline Phosphatase: 244 U/L — ABNORMAL HIGH (ref 38–126)
Anion gap: 12 (ref 5–15)
BUN: 12 mg/dL (ref 6–20)
CO2: 21 mmol/L — ABNORMAL LOW (ref 22–32)
Calcium: 9.7 mg/dL (ref 8.9–10.3)
Chloride: 104 mmol/L (ref 101–111)
Creatinine, Ser: 0.78 mg/dL (ref 0.44–1.00)
GFR calc Af Amer: >60 mL/min (ref >60)
GFR calc non Af Amer: >60 mL/min (ref >60)
Glucose, Bld: 137 mg/dL — ABNORMAL HIGH (ref 65–99)
Potassium: 4 mmol/L (ref 3.5–5.1)
Sodium: 137 mmol/L (ref 135–145)
Total Bilirubin: 3.9 mg/dL — ABNORMAL HIGH (ref 0.3–1.2)
Total Protein: 7.7 g/dL (ref 6.5–8.1)

## 2016-01-10 LAB — URINALYSIS, ROUTINE W REFLEX MICROSCOPIC
Glucose, UA: NEGATIVE mg/dL
Ketones, ur: 40 mg/dL — AB
Nitrite: NEGATIVE
Protein, ur: 100 mg/dL — AB
Specific Gravity, Urine: 1.02 (ref 1.005–1.030)
pH: 6 (ref 5.0–8.0)

## 2016-01-10 LAB — CBC
HCT: 41.1 % (ref 36.0–46.0)
Hemoglobin: 14.3 g/dL (ref 12.0–15.0)
MCH: 31.9 pg (ref 26.0–34.0)
MCHC: 34.8 g/dL (ref 30.0–36.0)
MCV: 91.7 fL (ref 78.0–100.0)
Platelets: 312 10*3/uL (ref 150–400)
RBC: 4.48 MIL/uL (ref 3.87–5.11)
RDW: 12.2 % (ref 11.5–15.5)
WBC: 9.5 10*3/uL (ref 4.0–10.5)

## 2016-01-10 LAB — LIPASE, BLOOD: Lipase: 23 U/L (ref 11–51)

## 2016-01-10 MED ORDER — FAMOTIDINE IN NACL 20-0.9 MG/50ML-% IV SOLN
20.0000 mg | Freq: Two times a day (BID) | INTRAVENOUS | Status: DC
  Administered 2016-01-10: 20 mg via INTRAVENOUS
  Filled 2016-01-10: qty 50

## 2016-01-10 MED ORDER — GI COCKTAIL ~~LOC~~
30.0000 mL | Freq: Once | ORAL | Status: AC
  Administered 2016-01-10: 30 mL via ORAL
  Filled 2016-01-10: qty 30

## 2016-01-10 MED ORDER — IOPAMIDOL (ISOVUE-300) INJECTION 61%
100.0000 mL | Freq: Once | INTRAVENOUS | Status: AC | PRN
  Administered 2016-01-10: 100 mL via INTRAVENOUS

## 2016-01-10 MED ORDER — SODIUM CHLORIDE 0.9 % IV BOLUS (SEPSIS)
1000.0000 mL | Freq: Once | INTRAVENOUS | Status: AC
  Administered 2016-01-10: 1000 mL via INTRAVENOUS

## 2016-01-10 MED ORDER — HYDROCODONE-ACETAMINOPHEN 5-325 MG PO TABS: 2.0000 | ORAL_TABLET | ORAL | 0 refills | Status: DC | PRN

## 2016-01-10 MED ORDER — HYDROMORPHONE HCL 1 MG/ML IJ SOLN
1.0000 mg | Freq: Once | INTRAMUSCULAR | Status: AC
  Administered 2016-01-10: 1 mg via INTRAVENOUS
  Filled 2016-01-10: qty 1

## 2016-01-10 NOTE — ED Notes (Signed)
In to initiate IV and medicate patient. Patient hyperventilating at 45 resps per minute. Attempted to coach patient's breathing, letting her know she will lose consciousness if she continues to breathe this quickly. Patient's visitor states "no she won't she is in pain." Patient instructed to lay back in bed for IV initiation, cooperative, but remains hyperventilating. Visitor states that "she needs about 20 times the normal dose of pain, she's tolerant." Patient medicated and within 30 seconds, breathing returned to normal pattern at 18 resps per minutes.

## 2016-01-10 NOTE — ED Triage Notes (Signed)
PT c/o epigastric pain with black and green stools per patient x17 days. PT denies any urinary symptoms or n/v/d.

## 2016-01-10 NOTE — ED Provider Notes (Signed)
[facility] DEPT Provider Note   CSN: [id number] Arrival date & time: 01/10/16  1138  First Provider Contact:  First MD Initiated Contact with Patient 01/10/16 1346     By signing my name below, I, Emmanuella Mensah, attest that this documentation has been prepared under the direction and in the presence of Noemi Chapel, MD. Electronically Signed: Judithann Sauger, ED Scribe. 01/10/16. 1:58 PM.    History   Chief Complaint Chief Complaint  Patient presents with  . Abdominal Pain   HPI Comments: [name] [name] is a 45 y.o. female who presents to the Emergency Department complaining of ongoing intermittent moderate epigastric pain onset 17 days ago. Pt reports associated tachypnea due to the pain and decreased appetite. She states that she had a waffle, apple sauce, and banana for breakfast and chicken with rice for dinner. She notes that this current episode began this am after eating breakfast and has lasted longer than her other episodes. No alleviating factors noted. She states that she has been taking Zantac for approx one week with no relief. Pt has a hx of cholecystectomy. She denies hx of pancreatis or any ulcers. She denies any ETOH use. No fever or n/v/d.   The history is provided by the patient. No language interpreter was used.    Past Medical History:  Diagnosis Date  . Anemia    As a child and while pregnant  . 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 Depression    parents died close together "was hard"  . Dyspnea    all the time .".due to the mass"  . Eczema   . Fibromyalgia   . Headache(784.0)    Birth control pills help  . Hemorrhoid   . History of radiation therapy 10/28/12-11/20/12   chest & neck,34Gy/76f  . Hypertension    recently dx with hodgkins  . Restless leg   . S/P cholecystectomy 09/14/2013   08/29/2013 by Dr. JArnoldo Morale  Negative pathology  . Sleep apnea    Sleep Study- 1996- "little apnea- no tx"    Patient  Active Problem List   Diagnosis Date Noted  . Exertional dyspnea 12/17/2015  . Chronic diastolic heart failure ([place] 12/17/2015  . Leg edema 01/28/2014  . S/P cholecystectomy 09/14/2013  . Citrobacter infection 09/30/2012  . Hodgkin's lymphoma ([place] 03/20/2012  . Fibromyalgia 02/29/2012  . Eczema   . Hypertension   . Depression   . Restless leg     Past Surgical History:  Procedure Laterality Date  . BONE MARROW BIOPSY  03/20/2012  . CHOLECYSTECTOMY N/A 08/29/2013   Procedure: LAPAROSCOPIC CHOLECYSTECTOMY;  Surgeon: MJamesetta So MD;  Location: [facility];  Service: General;  Laterality: N/A;  . DILATION AND CURETTAGE OF UTERUS  1991  . Mediastinal Mass Biopsy  03/05/12  . PORT-A-CATH REMOVAL Left 10/16/2012   Procedure: REMOVAL PORT-A-CATH;  Surgeon: SMelrose Nakayama MD;  Location: [facility]  Service: Thoracic;  Laterality: Left;  . PORTACATH PLACEMENT  03/26/2012   Procedure: INSERTION PORT-A-CATH;  Surgeon: SMelrose Nakayama MD;  [facility];  Service: Thoracic;  Laterality: N/A;  . WISDOM TOOTH EXTRACTION      OB History    Gravida Para Term Preterm AB Living             1   SAB TAB Ectopic Multiple Live Births                   Home Medications  Prior to Admission medications   Medication Sig Start Date End Date Taking? Authorizing Provider  B Complex-C-E-Zn (B COMPLEX-C-E-ZINC) tablet Take 1 tablet by mouth daily. Reported on 09/03/2015   Yes Historical Provider, MD  cholecalciferol (VITAMIN D) 1000 UNITS tablet Take 1,000 Units by mouth daily. Reported on 09/03/2015   Yes Historical Provider, MD  Cyanocobalamin (VITAMIN B-12 PO) Take 1 capsule by mouth daily.   Yes Historical Provider, MD  cyclobenzaprine (FLEXERIL) 10 MG tablet Take 10 mg by mouth at bedtime.   Yes Historical Provider, MD  hydrochlorothiazide (HYDRODIURIL) 12.5 MG tablet Take 1 tablet (12.5 mg total) by mouth daily. Patient taking differently: Take 12.5 mg by mouth daily as needed (edema).   12/16/15  Yes Shaune Pascal Bensimhon, MD  Ibuprofen-Diphenhydramine Cit (ADVIL PM PO) Take 2 tablets by mouth at bedtime as needed (pain/sleep).    Yes Historical Provider, MD  JUNEL FE 1/20 1-20 MG-MCG tablet Take 1 tablet by mouth daily. 03/13/14  Yes Historical Provider, MD  KRILL OIL PO Take 1 capsule by mouth daily.   Yes Historical Provider, MD  levothyroxine (SYNTHROID, LEVOTHROID) 125 MCG tablet Take 125 mcg by mouth daily before breakfast.   Yes Historical Provider, MD  MAGNESIUM PO Take 450 mg by mouth daily.   Yes Historical Provider, MD  Multiple Vitamins-Minerals (ANTIOXIDANT FORMULA) CAPS Take 1 capsule by mouth daily. Reported on 09/03/2015   Yes Historical Provider, MD  Multiple Vitamins-Minerals (HAIR/SKIN/NAILS/BIOTIN PO) Take 1 capsule by mouth daily. Reported on 09/03/2015   Yes Historical Provider, MD  piroxicam (FELDENE) 20 MG capsule Take 1 capsule by mouth daily as needed (plantar facitis.).  12/20/13  Yes Historical Provider, MD  temazepam (RESTORIL) 15 MG capsule Take 15 mg by mouth at bedtime as needed for sleep.   Yes Historical Provider, MD  tiZANidine (ZANAFLEX) 4 MG tablet Take 4 mg by mouth at bedtime. 12/25/12  Yes Baird Cancer, PA-C  HYDROcodone-acetaminophen (NORCO/VICODIN) 5-325 MG tablet Take 2 tablets by mouth every 4 (four) hours as needed. 01/10/16   Noemi Chapel, MD    Family History Family History  Problem Relation Age of Onset  . Cancer Paternal Uncle   . Cancer Maternal Grandmother     lung ca mets to throat    Social History Social History  Substance Use Topics  . Smoking status: Never Smoker  . Smokeless tobacco: Never Used  . Alcohol use 0.0 oz/week    0 drink(s) per week     Comment: occasionaly 2 x month; rarely     Allergies   Other and Sulfur   Review of Systems Review of Systems  Constitutional: Positive for appetite change. Negative for fever.  Gastrointestinal: Positive for abdominal pain. Negative for diarrhea, nausea and  vomiting.  All other systems reviewed and are negative.    Physical Exam Updated Vital Signs BP 140/71 (BP Location: Right Arm)   Pulse 68   Temp 98.6 F (37 C) (Oral)   Resp 18   Ht 5' 6"  (1.676 m)   Wt 212 lb (96.2 kg)   SpO2 100%   BMI 34.22 kg/m   Physical Exam  Constitutional: She appears well-developed and well-nourished. No distress.  HENT:  Head: Normocephalic and atraumatic.  Mouth/Throat: Oropharynx is clear and moist. No oropharyngeal exudate.  Eyes: Conjunctivae and EOM are normal. Pupils are equal, round, and reactive to light. Right eye exhibits no discharge. Left eye exhibits no discharge. No scleral icterus.  Neck: Normal range of motion. Neck supple.  No JVD present. No thyromegaly present.  Cardiovascular: Normal rate, regular rhythm, normal heart sounds and intact distal pulses.  Exam reveals no gallop and no friction rub.   No murmur heard. Pulmonary/Chest: Effort normal and breath sounds normal. Tachypnea noted. No respiratory distress. She has no wheezes. She has no rales.  Abdominal: Soft. Bowel sounds are normal. She exhibits no distension and no mass. There is no guarding.  Mild epigastric tenderness  Musculoskeletal: Normal range of motion. She exhibits no edema or tenderness.  Lymphadenopathy:    She has no cervical adenopathy.  Neurological: She is alert. Coordination normal.  Skin: Skin is warm and dry. No rash noted. No erythema.  Psychiatric: She has a normal mood and affect. Her behavior is normal.  Nursing note and vitals reviewed.    ED Treatments / Results  DIAGNOSTIC STUDIES: Oxygen Saturation is 100% on RA, normal by my interpretation.    COORDINATION OF CARE: 1:54 PM- Pt advised of plan for treatment and pt agrees. Pt will receive lab work for further evaluation.    Labs (all labs ordered are listed, but only abnormal results are displayed) Labs Reviewed  COMPREHENSIVE METABOLIC PANEL - Abnormal; Notable for the following:        Result Value   CO2 21 (*)    Glucose, Bld 137 (*)    AST 230 (*)    ALT 403 (*)    Alkaline Phosphatase 244 (*)    Total Bilirubin 3.9 (*)    All other components within normal limits  URINALYSIS, ROUTINE W REFLEX MICROSCOPIC (NOT AT [facility]) - Abnormal; Notable for the following:    Color, Urine AMBER (*)    APPearance HAZY (*)    Hgb urine dipstick MODERATE (*)    Bilirubin Urine MODERATE (*)    Ketones, ur 40 (*)    Protein, ur 100 (*)    Leukocytes, UA MODERATE (*)    All other components within normal limits  URINE MICROSCOPIC-ADD ON - Abnormal; Notable for the following:    Squamous Epithelial / LPF TOO NUMEROUS TO COUNT (*)    Bacteria, UA MANY (*)    All other components within normal limits  LIPASE, BLOOD  CBC    EKG  EKG Interpretation None       Radiology Ct Abdomen Pelvis W Contrast  Result Date: 01/10/2016 CLINICAL DATA:  Severe epigastric pain. Elevated bilirubin. History lymphoma EXAM: CT ABDOMEN AND PELVIS WITH CONTRAST TECHNIQUE: Multidetector CT imaging of the abdomen and pelvis was performed using the standard protocol following bolus administration of intravenous contrast. CONTRAST:  151m ISOVUE-300 IOPAMIDOL (ISOVUE-300) INJECTION 61% COMPARISON:  CT abdomen pelvis 12/30/2015 FINDINGS: Lower chest:  Lung bases clear without infiltrate or effusion. Hepatobiliary: Cholecystectomy. Normal-appearing liver. No biliary dilatation. Mild biliary dilatation seen previously has resolved. Pancreas: Normal pancreas without evidence of pancreatitis. No pancreatic calcifications or masses. Spleen: Negative Adrenals/Urinary Tract: Symmetric excretion of contrast by both kidneys. No renal obstruction or mass. Normal urinary bladder. Stomach/Bowel: Negative for bowel obstruction. No bowel edema. Stomach and duodenum normal without evidence of edema. Negative for diverticulitis. Normal appendix. Vascular/Lymphatic: Normal aorta and IVC. No lymphadenopathy or mass lesion.  Reproductive: Normal uterus.  No adnexal mass. Other: No free fluid.  No free air. Musculoskeletal: No acute skeletal abnormality. IMPRESSION: No cause for acute abdominal pain. Postop cholecystectomy. Bile ducts are nondilated and appear decompressed compared with the recent CT when there was mild prominence of the bile ducts Normal appendix. Electronically Signed   By:  Franchot Gallo M.D.   On: 01/10/2016 16:47    Procedures Procedures (including critical care time)  Medications Ordered in ED Medications  famotidine (PEPCID) IVPB 20 mg premix (0 mg Intravenous Stopped 01/10/16 1521)  HYDROmorphone (DILAUDID) injection 1 mg (1 mg Intravenous Given 01/10/16 1416)  gi cocktail (Maalox,Lidocaine,Donnatal) (30 mLs Oral Given 01/10/16 1416)  sodium chloride 0.9 % bolus 1,000 mL (1,000 mLs Intravenous New Bag/Given 01/10/16 1416)  iopamidol (ISOVUE-300) 61 % injection 100 mL (100 mLs Intravenous Contrast Given 01/10/16 1623)     Initial Impression / Assessment and Plan / ED Course  Noemi Chapel, MD has reviewed the triage vital signs and the nursing notes.  Pertinent labs & imaging results that were available during my care of the patient were reviewed by me and considered in my medical decision making (see chart for details).  Clinical Course  Comment By Time  The patient has an abnormal workup with elevated liver function and an elevated bilirubin of 3.9. This is very abnormal and would suggest some kind of biliary obstruction. Her intermittent postprandial pain is suggestive of a retained gallstone, peptic ulcer disease or pancreatitis though her lipase today is normal as is her white blood cell count. We'll proceed with CT scan. The patient is very tachypneic but I suggest this is likely related to pain as she has no pulmonary complaints. Otherwise her vital signs are unremarkable. Noemi Chapel, MD 07/31 1358   D/w Dr. Oneida Alar - will arrange for HIDA = CT here is neg, pt back to normal after  dilaudid.  Final Clinical Impressions(s) / ED Diagnoses   Final diagnoses:  Epigastric pain  High bilirubin   I personally performed the services described in this documentation, which was scribed in my presence. The recorded information has been reviewed and is accurate.      New Prescriptions New Prescriptions   HYDROCODONE-ACETAMINOPHEN (NORCO/VICODIN) 5-325 MG TABLET    Take 2 tablets by mouth every 4 (four) hours as needed.     Noemi Chapel, MD 01/10/16 [phone]

## 2016-01-10 NOTE — ED Notes (Signed)
Patient with no complaints at this time. Respirations even and unlabored. Skin warm/dry. Discharge instructions reviewed with patient at this time. Patient given opportunity to voice concerns/ask questions. IV removed per policy and band-aid applied to site. Patient discharged at this time and left Emergency Department with steady gait.  

## 2016-01-10 NOTE — Telephone Encounter (Signed)
PT NPV THIS WEEK DX: ELEVATED LIVER ENZYMES-MIXED PATTERN, PMHx: LYMPHOMA. NEEDS SEROLOGIES(VIRAL, AUTOIMMUNE, PBC) AND IF NEGATIVE THEN AN MRCP. I PERSONALLY REVIEWED CT JUL 20 V. JUL 31 W/ DR. Leonia Reeves.

## 2016-01-11 ENCOUNTER — Encounter (HOSPITAL_COMMUNITY): Payer: Self-pay

## 2016-01-11 ENCOUNTER — Inpatient Hospital Stay (HOSPITAL_COMMUNITY)
Admission: EM | Admit: 2016-01-11 | Discharge: 2016-01-14 | DRG: 446 | Disposition: A | Discharge status: Home / Self Care | Payer: Commercial Managed Care - PPO | Attending: Internal Medicine | Admitting: Internal Medicine

## 2016-01-11 ENCOUNTER — Other Ambulatory Visit: Payer: Self-pay

## 2016-01-11 ENCOUNTER — Inpatient Hospital Stay (HOSPITAL_COMMUNITY): Payer: Commercial Managed Care - PPO

## 2016-01-11 ENCOUNTER — Telehealth: Payer: Self-pay

## 2016-01-11 DIAGNOSIS — Z9889 Other specified postprocedural states: Secondary | ICD-10-CM

## 2016-01-11 DIAGNOSIS — Z9049 Acquired absence of other specified parts of digestive tract: Secondary | ICD-10-CM

## 2016-01-11 DIAGNOSIS — R1013 Epigastric pain: Secondary | ICD-10-CM | POA: Diagnosis present

## 2016-01-11 DIAGNOSIS — R945 Abnormal results of liver function studies: Secondary | ICD-10-CM

## 2016-01-11 DIAGNOSIS — R109 Unspecified abdominal pain: Secondary | ICD-10-CM

## 2016-01-11 DIAGNOSIS — K8050 Calculus of bile duct without cholangitis or cholecystitis without obstruction: Secondary | ICD-10-CM | POA: Diagnosis not present

## 2016-01-11 DIAGNOSIS — Z923 Personal history of irradiation: Secondary | ICD-10-CM

## 2016-01-11 DIAGNOSIS — F329 Major depressive disorder, single episode, unspecified: Secondary | ICD-10-CM | POA: Diagnosis present

## 2016-01-11 DIAGNOSIS — Z801 Family history of malignant neoplasm of trachea, bronchus and lung: Secondary | ICD-10-CM | POA: Diagnosis not present

## 2016-01-11 DIAGNOSIS — R932 Abnormal findings on diagnostic imaging of liver and biliary tract: Secondary | ICD-10-CM | POA: Diagnosis not present

## 2016-01-11 DIAGNOSIS — R1030 Lower abdominal pain, unspecified: Secondary | ICD-10-CM | POA: Diagnosis present

## 2016-01-11 DIAGNOSIS — R748 Abnormal levels of other serum enzymes: Secondary | ICD-10-CM | POA: Diagnosis not present

## 2016-01-11 DIAGNOSIS — K8071 Calculus of gallbladder and bile duct without cholecystitis with obstruction: Secondary | ICD-10-CM | POA: Diagnosis present

## 2016-01-11 DIAGNOSIS — E039 Hypothyroidism, unspecified: Secondary | ICD-10-CM | POA: Diagnosis present

## 2016-01-11 DIAGNOSIS — R7989 Other specified abnormal findings of blood chemistry: Secondary | ICD-10-CM | POA: Diagnosis present

## 2016-01-11 DIAGNOSIS — G2581 Restless legs syndrome: Secondary | ICD-10-CM | POA: Diagnosis present

## 2016-01-11 DIAGNOSIS — R17 Unspecified jaundice: Secondary | ICD-10-CM | POA: Diagnosis present

## 2016-01-11 DIAGNOSIS — Z8571 Personal history of Hodgkin lymphoma: Secondary | ICD-10-CM

## 2016-01-11 DIAGNOSIS — M797 Fibromyalgia: Secondary | ICD-10-CM | POA: Diagnosis present

## 2016-01-11 DIAGNOSIS — E876 Hypokalemia: Secondary | ICD-10-CM | POA: Diagnosis present

## 2016-01-11 DIAGNOSIS — I10 Essential (primary) hypertension: Secondary | ICD-10-CM | POA: Diagnosis present

## 2016-01-11 DIAGNOSIS — K8051 Calculus of bile duct without cholangitis or cholecystitis with obstruction: Secondary | ICD-10-CM | POA: Diagnosis not present

## 2016-01-11 LAB — COMPREHENSIVE METABOLIC PANEL
ALT: 565 U/L — ABNORMAL HIGH (ref 14–54)
ALT: 580 U/L — ABNORMAL HIGH (ref 14–54)
AST: 368 U/L — ABNORMAL HIGH (ref 15–41)
AST: 390 U/L — ABNORMAL HIGH (ref 15–41)
Albumin: 4.3 g/dL (ref 3.5–5.0)
Albumin: 3.5 g/dL (ref 3.5–5.0)
Alkaline Phosphatase: 248 U/L — ABNORMAL HIGH (ref 38–126)
Alkaline Phosphatase: 210 U/L — ABNORMAL HIGH (ref 38–126)
Anion gap: 12 (ref 5–15)
Anion gap: 3 — ABNORMAL LOW (ref 5–15)
BUN: 9 mg/dL (ref 6–20)
BUN: 9 mg/dL (ref 6–20)
CO2: 19 mmol/L — ABNORMAL LOW (ref 22–32)
CO2: 24 mmol/L (ref 22–32)
Calcium: 9.3 mg/dL (ref 8.9–10.3)
Calcium: 8.2 mg/dL — ABNORMAL LOW (ref 8.9–10.3)
Chloride: 105 mmol/L (ref 101–111)
Chloride: 110 mmol/L (ref 101–111)
Creatinine, Ser: 0.67 mg/dL (ref 0.44–1.00)
Creatinine, Ser: 0.66 mg/dL (ref 0.44–1.00)
GFR calc Af Amer: >60 mL/min (ref >60)
GFR calc Af Amer: >60 mL/min (ref >60)
GFR calc non Af Amer: >60 mL/min (ref >60)
GFR calc non Af Amer: >60 mL/min (ref >60)
Glucose, Bld: 116 mg/dL — ABNORMAL HIGH (ref 65–99)
Glucose, Bld: 89 mg/dL (ref 65–99)
Potassium: 3.3 mmol/L — ABNORMAL LOW (ref 3.5–5.1)
Potassium: 3.9 mmol/L (ref 3.5–5.1)
Sodium: 136 mmol/L (ref 135–145)
Sodium: 137 mmol/L (ref 135–145)
Total Bilirubin: 6.3 mg/dL — ABNORMAL HIGH (ref 0.3–1.2)
Total Bilirubin: 5.3 mg/dL — ABNORMAL HIGH (ref 0.3–1.2)
Total Protein: 7.9 g/dL (ref 6.5–8.1)
Total Protein: 6.4 g/dL — ABNORMAL LOW (ref 6.5–8.1)

## 2016-01-11 LAB — CBC WITH DIFFERENTIAL/PLATELET
Basophils Absolute: 0 10*3/uL (ref 0.0–0.1)
Basophils Relative: 0 %
Eosinophils Absolute: 0 10*3/uL (ref 0.0–0.7)
Eosinophils Relative: 1 %
HCT: 40 % (ref 36.0–46.0)
Hemoglobin: 14.1 g/dL (ref 12.0–15.0)
Lymphocytes Relative: 14 %
Lymphs Abs: 0.7 10*3/uL (ref 0.7–4.0)
MCH: 32.3 pg (ref 26.0–34.0)
MCHC: 35.3 g/dL (ref 30.0–36.0)
MCV: 91.5 fL (ref 78.0–100.0)
Monocytes Absolute: 0.4 10*3/uL (ref 0.1–1.0)
Monocytes Relative: 7 %
Neutro Abs: 4.2 10*3/uL (ref 1.7–7.7)
Neutrophils Relative %: 78 %
Platelets: 270 10*3/uL (ref 150–400)
RBC: 4.37 MIL/uL (ref 3.87–5.11)
RDW: 12.2 % (ref 11.5–15.5)
WBC: 5.3 10*3/uL (ref 4.0–10.5)

## 2016-01-11 LAB — URINALYSIS, ROUTINE W REFLEX MICROSCOPIC
Glucose, UA: 100 mg/dL — AB
Ketones, ur: >80 mg/dL — AB
Leukocytes, UA: NEGATIVE
Nitrite: NEGATIVE
Protein, ur: 30 mg/dL — AB
Specific Gravity, Urine: >1.03 — ABNORMAL HIGH (ref 1.005–1.030)
pH: 5.5 (ref 5.0–8.0)

## 2016-01-11 LAB — URINE MICROSCOPIC-ADD ON

## 2016-01-11 LAB — TSH: TSH: 0.548 u[IU]/mL (ref 0.350–4.500)

## 2016-01-11 LAB — LIPASE, BLOOD: Lipase: 30 U/L (ref 11–51)

## 2016-01-11 LAB — BILIRUBIN, DIRECT
Bilirubin, Direct: 2.4 mg/dL — ABNORMAL HIGH (ref <=0.2)
Bilirubin, Direct: 3.6 mg/dL — ABNORMAL HIGH (ref 0.1–0.5)

## 2016-01-11 MED ORDER — SODIUM CHLORIDE 0.9 % IV SOLN: 250.0000 mL | INTRAVENOUS | Status: DC | PRN

## 2016-01-11 MED ORDER — CYCLOBENZAPRINE HCL 10 MG PO TABS
10.0000 mg | ORAL_TABLET | Freq: Every day | ORAL | Status: DC
  Administered 2016-01-12 – 2016-01-13 (×2): 10 mg via ORAL
  Filled 2016-01-11 (×3): qty 1

## 2016-01-11 MED ORDER — SODIUM CHLORIDE 0.9% FLUSH: 3.0000 mL | INTRAVENOUS | Status: DC | PRN

## 2016-01-11 MED ORDER — PROMETHAZINE HCL 12.5 MG PO TABS: ORAL_TABLET | ORAL | 0 refills | Status: DC

## 2016-01-11 MED ORDER — TEMAZEPAM 15 MG PO CAPS
15.0000 mg | ORAL_CAPSULE | Freq: Every evening | ORAL | Status: DC | PRN
  Administered 2016-01-12 – 2016-01-13 (×2): 15 mg via ORAL
  Filled 2016-01-11 (×2): qty 1

## 2016-01-11 MED ORDER — FAMOTIDINE IN NACL 20-0.9 MG/50ML-% IV SOLN
20.0000 mg | Freq: Once | INTRAVENOUS | Status: AC
  Administered 2016-01-11: 20 mg via INTRAVENOUS
  Filled 2016-01-11: qty 50

## 2016-01-11 MED ORDER — HYDROMORPHONE HCL 1 MG/ML IJ SOLN
1.0000 mg | Freq: Once | INTRAMUSCULAR | Status: AC
  Administered 2016-01-11: 1 mg via INTRAVENOUS
  Filled 2016-01-11: qty 1

## 2016-01-11 MED ORDER — LORAZEPAM 2 MG/ML IJ SOLN
1.0000 mg | Freq: Once | INTRAMUSCULAR | Status: AC
  Administered 2016-01-11: 1 mg via INTRAVENOUS
  Filled 2016-01-11: qty 1

## 2016-01-11 MED ORDER — GADOBENATE DIMEGLUMINE 529 MG/ML IV SOLN
20.0000 mL | Freq: Once | INTRAVENOUS | Status: AC | PRN
  Administered 2016-01-11: 20 mL via INTRAVENOUS

## 2016-01-11 MED ORDER — HYDRALAZINE HCL 20 MG/ML IJ SOLN: 10.0000 mg | Freq: Four times a day (QID) | INTRAMUSCULAR | Status: DC | PRN

## 2016-01-11 MED ORDER — POLYETHYLENE GLYCOL 3350 17 G PO PACK: 17.0000 g | PACK | Freq: Every day | ORAL | Status: DC | PRN

## 2016-01-11 MED ORDER — SODIUM CHLORIDE 0.9% FLUSH: 3.0000 mL | Freq: Two times a day (BID) | INTRAVENOUS | Status: DC

## 2016-01-11 MED ORDER — ONDANSETRON HCL 4 MG/2ML IJ SOLN
4.0000 mg | Freq: Four times a day (QID) | INTRAMUSCULAR | Status: DC | PRN
  Administered 2016-01-12: 4 mg via INTRAVENOUS
  Filled 2016-01-11 (×2): qty 2

## 2016-01-11 MED ORDER — ALBUTEROL SULFATE (2.5 MG/3ML) 0.083% IN NEBU: 2.5000 mg | INHALATION_SOLUTION | RESPIRATORY_TRACT | Status: DC | PRN

## 2016-01-11 MED ORDER — MORPHINE SULFATE (PF) 2 MG/ML IV SOLN
2.0000 mg | INTRAVENOUS | Status: DC | PRN
  Administered 2016-01-12 (×3): 2 mg via INTRAVENOUS
  Filled 2016-01-11 (×3): qty 1

## 2016-01-11 MED ORDER — PROMETHAZINE HCL 12.5 MG PO TABS: 12.5000 mg | ORAL_TABLET | Freq: Four times a day (QID) | ORAL | Status: DC | PRN

## 2016-01-11 MED ORDER — PANTOPRAZOLE SODIUM 40 MG PO TBEC
40.0000 mg | DELAYED_RELEASE_TABLET | Freq: Every day | ORAL | Status: DC
  Administered 2016-01-13: 40 mg via ORAL
  Filled 2016-01-11 (×3): qty 1

## 2016-01-11 MED ORDER — LEVOTHYROXINE SODIUM 25 MCG PO TABS
125.0000 ug | ORAL_TABLET | Freq: Every day | ORAL | Status: DC
  Administered 2016-01-13: 125 ug via ORAL
  Filled 2016-01-11 (×3): qty 1

## 2016-01-11 MED ORDER — B COMPLEX-C PO TABS
1.0000 | ORAL_TABLET | Freq: Every day | ORAL | Status: DC
  Administered 2016-01-13 – 2016-01-14 (×2): 1 via ORAL
  Filled 2016-01-11 (×6): qty 1

## 2016-01-11 MED ORDER — OXYCODONE HCL 5 MG PO TABS
5.0000 mg | ORAL_TABLET | ORAL | Status: DC | PRN
  Administered 2016-01-12: 5 mg via ORAL
  Filled 2016-01-11: qty 1

## 2016-01-11 MED ORDER — TRAZODONE HCL 50 MG PO TABS: 50.0000 mg | ORAL_TABLET | Freq: Every evening | ORAL | Status: DC | PRN

## 2016-01-11 MED ORDER — ONDANSETRON HCL 4 MG PO TABS: 4.0000 mg | ORAL_TABLET | Freq: Four times a day (QID) | ORAL | Status: DC | PRN

## 2016-01-11 MED ORDER — SODIUM CHLORIDE 0.9 % IV BOLUS (SEPSIS)
1000.0000 mL | Freq: Once | INTRAVENOUS | Status: AC
  Administered 2016-01-11: 1000 mL via INTRAVENOUS

## 2016-01-11 MED ORDER — VITAMIN D 1000 UNITS PO TABS
1000.0000 [IU] | ORAL_TABLET | Freq: Every day | ORAL | Status: DC
  Administered 2016-01-13 – 2016-01-14 (×2): 1000 [IU] via ORAL
  Filled 2016-01-11 (×3): qty 1

## 2016-01-11 MED ORDER — SODIUM CHLORIDE 0.9 % IV SOLN
INTRAVENOUS | Status: DC
  Administered 2016-01-11 – 2016-01-12 (×2): via INTRAVENOUS

## 2016-01-11 MED ORDER — TIZANIDINE HCL 4 MG PO TABS
4.0000 mg | ORAL_TABLET | Freq: Every day | ORAL | Status: DC
  Filled 2016-01-11 (×2): qty 1

## 2016-01-11 MED ORDER — ONDANSETRON HCL 4 MG/2ML IJ SOLN
4.0000 mg | Freq: Three times a day (TID) | INTRAMUSCULAR | Status: DC
  Administered 2016-01-11 – 2016-01-13 (×3): 4 mg via INTRAVENOUS
  Filled 2016-01-11 (×8): qty 2

## 2016-01-11 MED ORDER — MAGNESIUM OXIDE 400 (241.3 MG) MG PO TABS
400.0000 mg | ORAL_TABLET | Freq: Every day | ORAL | Status: DC
  Administered 2016-01-13 – 2016-01-14 (×2): 400 mg via ORAL
  Filled 2016-01-11 (×3): qty 1

## 2016-01-11 MED ORDER — VITAMIN B-12 1000 MCG PO TABS
1000.0000 ug | ORAL_TABLET | Freq: Every day | ORAL | Status: DC
  Administered 2016-01-13 – 2016-01-14 (×2): 1000 ug via ORAL
  Filled 2016-01-11 (×3): qty 1

## 2016-01-11 MED ORDER — SENNA 8.6 MG PO TABS
1.0000 | ORAL_TABLET | Freq: Two times a day (BID) | ORAL | Status: DC
  Administered 2016-01-12 – 2016-01-14 (×4): 8.6 mg via ORAL
  Filled 2016-01-11 (×6): qty 1

## 2016-01-11 NOTE — Telephone Encounter (Signed)
Pt is aware and is going to have the labs done now and she will have the MRCP done in the morning. She has an appointment set up for Thursday to come into the office

## 2016-01-11 NOTE — ED Provider Notes (Signed)
[facility] DEPT Provider Note   CSN: [id number] Arrival date & time: 01/11/16  1254  First Provider Contact:  First MD Initiated Contact with Patient 01/11/16 1302     By signing my name below, I, Arianna Nassar, attest that this documentation has been prepared under the direction and in the presence of Milton Ferguson, MD.  Electronically Signed: Julien Nordmann, ED Scribe. 01/11/16. 1:11 PM.    History   Chief Complaint Chief Complaint  Patient presents with  . Abdominal Pain    Patient presents with abdominal pain and nausea. She was seen yesterday and was noticed to have elevated liver enzymes. She was referred to GI and MRCP has been ordered.   The history is provided by the patient. No language interpreter was used.  Abdominal Pain   This is a new problem. The current episode started more than 2 days ago. The problem occurs constantly. The problem has not changed since onset.The pain is associated with eating. The pain is located in the generalized abdominal region. The pain is at a severity of 5/10. The pain is moderate. Associated symptoms include anorexia. Pertinent negatives include diarrhea, frequency, hematuria and headaches. Nothing aggravates the symptoms. Nothing relieves the symptoms. Her past medical history does not include PUD.   HPI Cmments: [name] [name] is a 45 y.o. female who presents to the Emergency Department complaining of constant, gradual worsening, moderate epigastric abdominal pain onset 18 days ago. Pt notes associated belching. She was seen last night in the ED for the same symptoms with no relief. Pt has not seen her PCP for her abdominal pain. She has not taken any medication to alleviate her pain today. Pt denies vomiting.  Past Medical History:  Diagnosis Date  . Anemia    As a child and while pregnant  . 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 Depression    parents died close together "was hard"  . Dyspnea     all the time .".due to the mass"  . Eczema   . Fibromyalgia   . Headache(784.0)    Birth control pills help  . Hemorrhoid   . History of radiation therapy 10/28/12-11/20/12   chest & neck,34Gy/12f  . Hypertension    recently dx with hodgkins  . Restless leg   . S/P cholecystectomy 09/14/2013   08/29/2013 by Dr. JArnoldo Morale  Negative pathology  . Sleep apnea    Sleep Study- 1996- "little apnea- no tx"    Patient Active Problem List   Diagnosis Date Noted  . Exertional dyspnea 12/17/2015  . [place] 12/17/2015  . Leg edema 01/28/2014  . S/P cholecystectomy 09/14/2013  . Citrobacter infection 09/30/2012  . Hodgkin's lymphoma ([place] 03/20/2012  . Fibromyalgia 02/29/2012  . Eczema   . Hypertension   . Depression   . Restless leg     Past Surgical History:  Procedure Laterality Date  . BONE MARROW BIOPSY  03/20/2012  . CHOLECYSTECTOMY N/A 08/29/2013   Procedure: LAPAROSCOPIC CHOLECYSTECTOMY;  Surgeon: MJamesetta So MD;  Location: [facility];  Service: General;  Laterality: N/A;  . DILATION AND CURETTAGE OF UTERUS  1991  . Mediastinal Mass Biopsy  03/05/12  . PORT-A-CATH REMOVAL Left 10/16/2012   Procedure: REMOVAL PORT-A-CATH;  Surgeon: SMelrose Nakayama MD;  [facility]  Service: Thoracic;  Laterality: Left;  . PORTACATH PLACEMENT  03/26/2012   Procedure: INSERTION PORT-A-CATH;  Surgeon: SMelrose Nakayama MD;  [facility]  Service: Thoracic;  Laterality: N/A;  . WISDOM TOOTH EXTRACTION      OB History    Gravida Para Term Preterm AB Living             1   SAB TAB Ectopic Multiple Live Births                   Home Medications    Prior to Admission medications   Medication Sig Start Date End Date Taking? Authorizing Provider  B Complex-C-E-Zn (B COMPLEX-C-E-ZINC) tablet Take 1 tablet by mouth daily. Reported on 09/03/2015    Historical Provider, MD  cholecalciferol (VITAMIN D) 1000 UNITS tablet Take 1,000 Units by mouth daily.  Reported on 09/03/2015    Historical Provider, MD  Cyanocobalamin (VITAMIN B-12 PO) Take 1 capsule by mouth daily.    Historical Provider, MD  cyclobenzaprine (FLEXERIL) 10 MG tablet Take 10 mg by mouth at bedtime.    Historical Provider, MD  hydrochlorothiazide (HYDRODIURIL) 12.5 MG tablet Take 1 tablet (12.5 mg total) by mouth daily. Patient taking differently: Take 12.5 mg by mouth daily as needed (edema).  12/16/15   Jolaine Artist, MD  HYDROcodone-acetaminophen (NORCO/VICODIN) 5-325 MG tablet Take 2 tablets by mouth every 4 (four) hours as needed. 01/10/16   Noemi Chapel, MD  Ibuprofen-Diphenhydramine Cit (ADVIL PM PO) Take 2 tablets by mouth at bedtime as needed (pain/sleep).     Historical Provider, MD  JUNEL FE 1/20 1-20 MG-MCG tablet Take 1 tablet by mouth daily. 03/13/14   Historical Provider, MD  KRILL OIL PO Take 1 capsule by mouth daily.    Historical Provider, MD  levothyroxine (SYNTHROID, LEVOTHROID) 125 MCG tablet Take 125 mcg by mouth daily before breakfast.    Historical Provider, MD  MAGNESIUM PO Take 450 mg by mouth daily.    Historical Provider, MD  Multiple Vitamins-Minerals (ANTIOXIDANT FORMULA) CAPS Take 1 capsule by mouth daily. Reported on 09/03/2015    Historical Provider, MD  Multiple Vitamins-Minerals (HAIR/SKIN/NAILS/BIOTIN PO) Take 1 capsule by mouth daily. Reported on 09/03/2015    Historical Provider, MD  piroxicam (FELDENE) 20 MG capsule Take 1 capsule by mouth daily as needed (plantar facitis.).  12/20/13   Historical Provider, MD  promethazine (PHENERGAN) 12.5 MG tablet 1-2 PO 30 MINS PRIOR TO MEALS UP TO THREE TIMES A DAY. DOSES SHOULD BE 4 HRS APART. 01/11/16   Danie Binder, MD  temazepam (RESTORIL) 15 MG capsule Take 15 mg by mouth at bedtime as needed for sleep.    Historical Provider, MD  tiZANidine (ZANAFLEX) 4 MG tablet Take 4 mg by mouth at bedtime. 12/25/12   Baird Cancer, PA-C    Family History Family History  Problem Relation Age of Onset  . Cancer  Paternal Uncle   . Cancer Maternal Grandmother     lung ca mets to throat    Social History Social History  Substance Use Topics  . Smoking status: Never Smoker  . Smokeless tobacco: Never Used  . Alcohol use 0.0 oz/week    0 drink(s) per week     Comment: occasionaly 2 x month; rarely     Allergies   Other and Sulfur   Review of Systems Review of Systems  Constitutional: Negative for appetite change and fatigue.  HENT: Negative for congestion, ear discharge and sinus pressure.   Eyes: Negative for discharge.  Respiratory: Negative for cough.   Cardiovascular: Negative for chest pain.  Gastrointestinal: Positive for abdominal pain and anorexia. Negative for  diarrhea.  Genitourinary: Negative for frequency and hematuria.  Musculoskeletal: Negative for back pain.  Skin: Negative for rash.  Neurological: Negative for seizures and headaches.  Psychiatric/Behavioral: Negative for hallucinations.     Physical Exam Updated Vital Signs BP 151/83   Pulse 91   Temp 97.3 F (36.3 C) (Oral)   Resp (!) 48   Ht 5' 6.5" (1.689 m)   Wt 212 lb (96.2 kg)   LMP 02/11/2012   SpO2 100%   BMI 33.71 kg/m   Physical Exam  Constitutional: She is oriented to person, place, and time. She appears well-developed.  hyperventilating   HENT:  Head: Normocephalic.  Eyes: Conjunctivae and EOM are normal. No scleral icterus.  Neck: Neck supple. No thyromegaly present.  Cardiovascular: Normal rate and regular rhythm.  Exam reveals no gallop and no friction rub.   No murmur heard. Pulmonary/Chest: No stridor. She has no wheezes. She has no rales. She exhibits no tenderness.  Abdominal: She exhibits no distension. There is tenderness. There is no rebound.  Moderate epigastric tenderness  Musculoskeletal: Normal range of motion. She exhibits no edema.  Lymphadenopathy:    She has no cervical adenopathy.  Neurological: She is oriented to person, place, and time. She exhibits normal muscle  tone. Coordination normal.  Skin: No rash noted. No erythema.  Psychiatric: Her behavior is normal. Her mood appears anxious.  Nursing note and vitals reviewed.    ED Treatments / Results  DIAGNOSTIC STUDIES: Oxygen Saturation is 100% on RA, normal by my interpretation.  COORDINATION OF CARE:  1:10 PM Discussed treatment plan with pt at bedside and pt agreed to plan.  Labs (all labs ordered are listed, but only abnormal results are displayed) Labs Reviewed - No data to display  EKG  EKG Interpretation None       Radiology Ct Abdomen Pelvis W Contrast  Result Date: 01/10/2016 CLINICAL DATA:  Severe epigastric pain. Elevated bilirubin. History lymphoma EXAM: CT ABDOMEN AND PELVIS WITH CONTRAST TECHNIQUE: Multidetector CT imaging of the abdomen and pelvis was performed using the standard protocol following bolus administration of intravenous contrast. CONTRAST:  150m ISOVUE-300 IOPAMIDOL (ISOVUE-300) INJECTION 61% COMPARISON:  CT abdomen pelvis 12/30/2015 FINDINGS: Lower chest:  Lung bases clear without infiltrate or effusion. Hepatobiliary: Cholecystectomy. Normal-appearing liver. No biliary dilatation. Mild biliary dilatation seen previously has resolved. Pancreas: Normal pancreas without evidence of pancreatitis. No pancreatic calcifications or masses. Spleen: Negative Adrenals/Urinary Tract: Symmetric excretion of contrast by both kidneys. No renal obstruction or mass. Normal urinary bladder. Stomach/Bowel: Negative for bowel obstruction. No bowel edema. Stomach and duodenum normal without evidence of edema. Negative for diverticulitis. Normal appendix. Vascular/Lymphatic: Normal aorta and IVC. No lymphadenopathy or mass lesion. Reproductive: Normal uterus.  No adnexal mass. Other: No free fluid.  No free air. Musculoskeletal: No acute skeletal abnormality. IMPRESSION: No cause for acute abdominal pain. Postop cholecystectomy. Bile ducts are nondilated and appear decompressed compared  with the recent CT when there was mild prominence of the bile ducts Normal appendix. Electronically Signed   By: CFranchot GalloM.D.   On: 01/10/2016 16:47    Procedures Procedures (including critical care time)  Medications Ordered in ED Medications - No data to display   Initial Impression / Assessment and Plan / ED Course  I have reviewed the triage vital signs and the nursing notes.  Pertinent labs & imaging results that were available during my care of the patient were reviewed by me and considered in my medical decision making (see chart  for details).  Clinical Course    Patient with elevated liver enzymes. Her bili has gone from 3.9-6.3. I spoke with Dr. Oneida Alar and she agrees with admitting the patient to medicine and GI will follow  Final Clinical Impressions(s) / ED Diagnoses   Final diagnoses:  None   The chart was scribed for me under my direct supervision.  I personally performed the history, physical, and medical decision making and all procedures in the evaluation of this patient..  New Prescriptions New Prescriptions   No medications on file     Milton Ferguson, MD 01/11/16 [phone]

## 2016-01-11 NOTE — Telephone Encounter (Signed)
SEE TC JUL 31

## 2016-01-11 NOTE — ED Triage Notes (Signed)
Abdominal pain for 18 days.  Seen here yesterday with same.

## 2016-01-11 NOTE — ED Notes (Signed)
hospitalist at bedside

## 2016-01-11 NOTE — H&P (Signed)
Patient Demographics:    [name] [name], is a 45 y.o. female  MRN: [id number]   DOB - Dec 05, 1970  Admit Date - 01/11/2016  Outpatient Primary MD for the patient is [name], MD   Assessment & Plan:    Principal Problem:   Abdominal pain Active Problems:   Hypertension   Elevated LFTs   High bilirubin    1)Abd Pain/Elevated LFTs- ??? Etiology, CT abdomen and pelvis from 01/10/2016 on 01/11/2016 without acute intra-abdominal finding, patient is status post cholecystectomy in March 2015. Dr. Barney Drain from GI service as ordered MRCP, possible ERCP pending findings of the MRCP. AST is up to 368 today from 230 yesterday, ALT is up to 565 from 403, alkaline phosphatase is up to 248 from 244 and total bilirubin is up to 6.3 from 3.9 yesterday, Direct bilirubin is up to 3.6. Acute viral hepatitis profile is pending, ANA, anti-smooth antibody, IgG IgA antimitochondrial to body as well as INR are pending. Differential diagnosis includes autoimmune hepatitis, micro-lithiasis/cholelithiasis, acute hepatitis less likely, toxic hepatitis from ingestion of substances less likely. Patient has no fevers or chills or rigors. Hold vitamins and supplements  2)Hypothyroidism- restart levothyroxine, recheck TSH  3)H/o Hodgkin's lymphoma-patient has been disease free for 3 years, last PET scan was March 2017 without significant unusual findings    With History of - Reviewed by me  Past Medical History:  Diagnosis Date  . Anemia    As a child and while pregnant  . 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 Depression    parents died close together "was hard"  . Dyspnea    all the time .".due to the mass"  . Eczema   . Fibromyalgia   . Headache(784.0)    Birth control pills help  .  Hemorrhoid   . History of radiation therapy 10/28/12-11/20/12   chest & neck,34Gy/63f  . Hypertension    recently dx with hodgkins  . Restless leg   . S/P cholecystectomy 09/14/2013   08/29/2013 by Dr. JArnoldo Morale  Negative pathology  . Sleep apnea    Sleep Study- 1996- "little apnea- no tx"      Past Surgical History:  Procedure Laterality Date  . BONE MARROW BIOPSY  03/20/2012  . CHOLECYSTECTOMY N/A 08/29/2013   Procedure: LAPAROSCOPIC CHOLECYSTECTOMY;  Surgeon: MJamesetta So MD;  Location: [facility];  Service: General;  Laterality: N/A;  . DILATION AND CURETTAGE OF UTERUS  1991  . Mediastinal Mass Biopsy  03/05/12  . PORT-A-CATH REMOVAL Left 10/16/2012   Procedure: REMOVAL PORT-A-CATH;  Surgeon: SMelrose Nakayama MD;  [place] [facility]  Service: Thoracic;  Laterality: Left;  . PORTACATH PLACEMENT  03/26/2012   Procedure: INSERTION PORT-A-CATH;  Surgeon: SMelrose Nakayama MD;  [place] [facility]  Service: Thoracic;  Laterality: N/A;  . WShipmanEXTRACTION        Chief Complaint  Patient  presents with  . Abdominal Pain      HPI:    [name] [name]  is a 45 y.o. female, with past medical history relevant for status post cholecystectomy in March 2015 as well as history of lymphoma and hypothyroidism who presents to the ED for the second day in the room with abdominal pain associated with nausea. Over the last few days abdominal pain as mostly been epigastric area associated with nausea patient has had to self induce emesis in order to aleviate her symptoms. No fevers, no chills and no rigors. No leg pains less swelling, no shortness of breath no chest pains. No sick contacts at home no recent travel. Patient denies alcohol use. CT abdomen and pelvis done on 01/10/2016 and repeated on 01/11/2016 without acute findings in the ED today. Her urine is dark orange color, her stool is gray to green. Appetite has been poor, oral intake diminished due to nausea over the last few days. ED  provider contacted the GI physician Dr. Barney Drain who recommended admission    Review of systems:    In addition to the HPI above,   A full 12 point Review of Systems was done, except as stated above, all other Review of Systems were negative.    Social History:  Reviewed by me    Social History  Substance Use Topics  . Smoking status: Never Smoker  . Smokeless tobacco: Never Used  . Alcohol use 0.0 oz/week    0 drink(s) per week     Comment: occasionaly 2 x month; rarely       Family History :  Reviewed by me    Family History  Problem Relation Age of Onset  . Cancer Paternal Uncle   . Cancer Maternal Grandmother     lung ca mets to throat     Home Medications:   Prior to Admission medications   Medication Sig Start Date End Date Taking? Authorizing Provider  B Complex-C-E-Zn (B COMPLEX-C-E-ZINC) tablet Take 1 tablet by mouth daily. Reported on 09/03/2015   Yes Historical Provider, MD  cholecalciferol (VITAMIN D) 1000 UNITS tablet Take 1,000 Units by mouth daily. Reported on 09/03/2015   Yes Historical Provider, MD  Cyanocobalamin (VITAMIN B-12 PO) Take 1 capsule by mouth daily.   Yes Historical Provider, MD  cyclobenzaprine (FLEXERIL) 10 MG tablet Take 10 mg by mouth at bedtime.   Yes Historical Provider, MD  hydrochlorothiazide (HYDRODIURIL) 12.5 MG tablet Take 1 tablet (12.5 mg total) by mouth daily. Patient taking differently: Take 12.5 mg by mouth daily as needed (edema).  12/16/15  Yes Shaune Pascal Bensimhon, MD  Ibuprofen-Diphenhydramine Cit (ADVIL PM PO) Take 2 tablets by mouth at bedtime as needed (pain/sleep).    Yes Historical Provider, MD  JUNEL FE 1/20 1-20 MG-MCG tablet Take 1 tablet by mouth daily. 03/13/14  Yes Historical Provider, MD  KRILL OIL PO Take 1 capsule by mouth daily.   Yes Historical Provider, MD  levothyroxine (SYNTHROID, LEVOTHROID) 125 MCG tablet Take 125 mcg by mouth daily before breakfast.   Yes Historical Provider, MD  MAGNESIUM PO Take  450 mg by mouth daily.   Yes Historical Provider, MD  Multiple Vitamins-Minerals (ANTIOXIDANT FORMULA) CAPS Take 1 capsule by mouth daily. Reported on 09/03/2015   Yes Historical Provider, MD  Multiple Vitamins-Minerals (HAIR/SKIN/NAILS/BIOTIN PO) Take 1 capsule by mouth daily. Reported on 09/03/2015   Yes Historical Provider, MD  piroxicam (FELDENE) 20 MG capsule Take 1 capsule by mouth daily as needed (plantar  facitis.).  12/20/13  Yes Historical Provider, MD  temazepam (RESTORIL) 15 MG capsule Take 15 mg by mouth at bedtime as needed for sleep.   Yes Historical Provider, MD  tiZANidine (ZANAFLEX) 4 MG tablet Take 4 mg by mouth at bedtime. 12/25/12  Yes Baird Cancer, PA-C  HYDROcodone-acetaminophen (NORCO/VICODIN) 5-325 MG tablet Take 2 tablets by mouth every 4 (four) hours as needed. Patient not taking: Reported on 01/11/2016 01/10/16   Noemi Chapel, MD  promethazine (PHENERGAN) 12.5 MG tablet 1-2 PO 30 MINS PRIOR TO MEALS UP TO THREE TIMES A DAY. DOSES SHOULD BE 4 HRS APART. Patient not taking: Reported on 01/11/2016 01/11/16   Danie Binder, MD     Allergies:     Allergies  Allergen Reactions  . Other Other (See Comments)    Patient can not have straight oxygen due to reaction with chemo medications.   . Sulfur Anaphylaxis     Physical Exam:   Vitals  Blood pressure (!) 143/86, pulse 93, temperature 98.6 F (37 C), temperature source Oral, resp. rate 18, height 5' 6.6" (1.692 m), weight 95.7 kg (211 lb), last menstrual period 02/11/2012, SpO2 99 %.  Physical Examination: General appearance - alert, Overweight appearing, and in no distress Mental status - alert, oriented to person, place, and time, slightly anxious Eyes - sclera icterus Neck - supple, no JVD elevation , Chest - clear  to auscultation bilaterally, symmetrical air movement,  Heart - S1 and S2 normal,  Abdomen - soft, nondistended, no masses or organomegaly, epigastric area tenderness without rebound or guarding, CVA  areas are nontender Neurological - screening mental status exam normal, neck supple without rigidity, cranial nerves II through XII intact, DTR's normal and symmetric, no tremors or asterixis Extremities - no pedal edema noted, intact peripheral pulses  Skin - warm, dry, mild jaundice    Data Review:    CBC  Recent Labs Lab 01/10/16 1225 01/11/16 1305  WBC 9.5 5.3  HGB 14.3 14.1  HCT 41.1 40.0  PLT 312 270  MCV 91.7 91.5  MCH 31.9 32.3  MCHC 34.8 35.3  RDW 12.2 12.2  LYMPHSABS  --  0.7  MONOABS  --  0.4  EOSABS  --  0.0  BASOSABS  --  0.0   ------------------------------------------------------------------------------------------------------------------  Chemistries   Recent Labs Lab 01/10/16 1225 01/11/16 1305  NA 137 136  K 4.0 3.3*  CL 104 105  CO2 21* 19*  GLUCOSE 137* 116*  BUN 12 9  CREATININE 0.78 0.67  CALCIUM 9.7 9.3  AST 230* 368*  ALT 403* 565*  ALKPHOS 244* 248*  BILITOT 3.9* 6.3*   ------------------------------------------------------------------------------------------------------------------ estimated creatinine clearance is 103.6 mL/min (by C-G formula based on SCr of 0.8 mg/dL). ------------------------------------------------------------------------------------------------------------------ No results for input(s): TSH, T4TOTAL, T3FREE, THYROIDAB in the last 72 hours.  Invalid input(s): FREET3   Coagulation profile No results for input(s): INR, PROTIME in the last 168 hours. ------------------------------------------------------------------------------------------------------------------- No results for input(s): DDIMER in the last 72 hours. -------------------------------------------------------------------------------------------------------------------  Cardiac Enzymes No results for input(s): CKMB, TROPONINI, MYOGLOBIN in the last 168 hours.  Invalid input(s):  CK ------------------------------------------------------------------------------------------------------------------ No results found for: BNP   ---------------------------------------------------------------------------------------------------------------  Urinalysis    Component Value Date/Time   COLORURINE AMBER (A) 01/11/2016 1335   APPEARANCEUR CLEAR 01/11/2016 1335   LABSPEC >1.030 (H) 01/11/2016 1335   PHURINE 5.5 01/11/2016 1335   GLUCOSEU 100 (A) 01/11/2016 1335   HGBUR MODERATE (A) 01/11/2016 1335   BILIRUBINUR LARGE (A) 01/11/2016 1335  KETONESUR >80 (A) 01/11/2016 1335   PROTEINUR 30 (A) 01/11/2016 1335   UROBILINOGEN 0.2 09/27/2012 1249   NITRITE NEGATIVE 01/11/2016 1335   LEUKOCYTESUR NEGATIVE 01/11/2016 1335    ----------------------------------------------------------------------------------------------------------------   Imaging Results:    Ct Abdomen Pelvis W Contrast  Result Date: 01/10/2016 CLINICAL DATA:  Severe epigastric pain. Elevated bilirubin. History lymphoma EXAM: CT ABDOMEN AND PELVIS WITH CONTRAST TECHNIQUE: Multidetector CT imaging of the abdomen and pelvis was performed using the standard protocol following bolus administration of intravenous contrast. CONTRAST:  149m ISOVUE-300 IOPAMIDOL (ISOVUE-300) INJECTION 61% COMPARISON:  CT abdomen pelvis 12/30/2015 FINDINGS: Lower chest:  Lung bases clear without infiltrate or effusion. Hepatobiliary: Cholecystectomy. Normal-appearing liver. No biliary dilatation. Mild biliary dilatation seen previously has resolved. Pancreas: Normal pancreas without evidence of pancreatitis. No pancreatic calcifications or masses. Spleen: Negative Adrenals/Urinary Tract: Symmetric excretion of contrast by both kidneys. No renal obstruction or mass. Normal urinary bladder. Stomach/Bowel: Negative for bowel obstruction. No bowel edema. Stomach and duodenum normal without evidence of edema. Negative for diverticulitis. Normal  appendix. Vascular/Lymphatic: Normal aorta and IVC. No lymphadenopathy or mass lesion. Reproductive: Normal uterus.  No adnexal mass. Other: No free fluid.  No free air. Musculoskeletal: No acute skeletal abnormality. IMPRESSION: No cause for acute abdominal pain. Postop cholecystectomy. Bile ducts are nondilated and appear decompressed compared with the recent CT when there was mild prominence of the bile ducts Normal appendix. Electronically Signed   By: CFranchot GalloM.D.   On: 01/10/2016 16:47    Radiological Exams on Admission: Ct Abdomen Pelvis W Contrast  Result Date: 01/10/2016 CLINICAL DATA:  Severe epigastric pain. Elevated bilirubin. History lymphoma EXAM: CT ABDOMEN AND PELVIS WITH CONTRAST TECHNIQUE: Multidetector CT imaging of the abdomen and pelvis was performed using the standard protocol following bolus administration of intravenous contrast. CONTRAST:  1056mISOVUE-300 IOPAMIDOL (ISOVUE-300) INJECTION 61% COMPARISON:  CT abdomen pelvis 12/30/2015 FINDINGS: Lower chest:  Lung bases clear without infiltrate or effusion. Hepatobiliary: Cholecystectomy. Normal-appearing liver. No biliary dilatation. Mild biliary dilatation seen previously has resolved. Pancreas: Normal pancreas without evidence of pancreatitis. No pancreatic calcifications or masses. Spleen: Negative Adrenals/Urinary Tract: Symmetric excretion of contrast by both kidneys. No renal obstruction or mass. Normal urinary bladder. Stomach/Bowel: Negative for bowel obstruction. No bowel edema. Stomach and duodenum normal without evidence of edema. Negative for diverticulitis. Normal appendix. Vascular/Lymphatic: Normal aorta and IVC. No lymphadenopathy or mass lesion. Reproductive: Normal uterus.  No adnexal mass. Other: No free fluid.  No free air. Musculoskeletal: No acute skeletal abnormality. IMPRESSION: No cause for acute abdominal pain. Postop cholecystectomy. Bile ducts are nondilated and appear decompressed compared with the  recent CT when there was mild prominence of the bile ducts Normal appendix. Electronically Signed   By: ChFranchot Gallo.D.   On: 01/10/2016 16:47    DVT Prophylaxis -scd  AM Labs Ordered, also please review Full Orders  Family Communication: Admission, patients condition and plan of care including tests being ordered have been discussed with the patient who indicate understanding and agree with the plan   Code Status - Full Code  Likely DC to  home  Condition   stable  Ashaunte Standley M.D on 01/11/2016 at 9:27 PM   Between 7am to 7pm - Pager - [phone] 7pm go to [email or web address] - password [email or web address]  [name] - Office  [phone] dictation system was used to create this note, attempts have been made to correct errors, however presence of uncorrected errors is not a reflection  quality of care provided.

## 2016-01-11 NOTE — Telephone Encounter (Signed)
PLEASE CALL PT. I PERSONALLY REVIEWED THE CT WITH radiology. She needs an MRCP, Dx: ELEVATED LIVER ENZYMES. ATTEMPT TO SCHEDULE TODAY. SHE CAN USE PHENERGAN 30 MINSPRIOR TO MEALS TID  TO CONTROL NAUSEA/VOMITING. SHE NEEDS TO COMPLETE HER LABS. ORDERS ENTERED. SHE SHOULD USE VICODIN 1 Q4H PRN PAIN. DO NOT USE 2 EVERY 4 HOURS DUE TO MEDS CONTAIN ACETAMINOPHEN.

## 2016-01-11 NOTE — Telephone Encounter (Signed)
Pt came by the office this morning to get a HIDA scan done. Please advise

## 2016-01-11 NOTE — Telephone Encounter (Signed)
Pt is set up for Thursday at 8:30 but she is still asking about a test before then because she is unable to eat only able to keep water down. Please advise

## 2016-01-11 NOTE — Consult Note (Addendum)
Referring Provider: No ref. provider found Primary Care Physician:  Zack Hall, MD Primary Gastroenterologist:  Sandi Fields  Reason for Consultation:  ABDOMINAL PAIN/ELEVATED LIVER ENZYMES   Impression: ADMITTED WITH MIXED PATTERN ELEVATION IN LIVER ENZYMES.  DIFFERENTIAL DIAGNOSIS INCLUDES: DISTAL CBD STONE/SLUDGE/MICROLITHIASIS WITH BENIGN STRICTURE, AUTOIMMUNE HEPATITIS WITH OVERLAP SYNDROME, LESS LIKELY VIRAL HEPATITIS OR INFILTRATIVE LIVER DISEASE(SARCOID, AMYLOID, LYMPHOMA).  Plan: 1. NPO EXCEPT SIPS WITH MEDS 2. ZOFRAN ATC. PRN PAIN MEDS 3. SCDs FOR DVT PROPHYLAXIS 4. MRCP TONIGHT. PLAN ERCP/?ABX PROPHYLAXIS IF DISTAL CBD STONE/STRICTURE. DISCUSSED PROCEDURE, BENEFITS, & RISKS: < 1% chance of medication reaction, bleeding, perforation, or 5% CHANCE OF PANCREATITIS.     HPI:  PT HAS PMHx: LYMPHOMA. LAST PET SCAN MAR 2017: NO DISEASE. PT HAS BEEN DISEASE FREE FOR 3 YEARS. HAD GB REMOVED FOR STONES IN 2015. FOR PAST 18 DAYS HAS HAD SHARP EPIGASTRIC PAIN. NO RADIATION. GETS WORSE WITH FOOD AND ASSOCIATED WITH NAUSEA. INDUCED VOMITING A COUPLE OF TIMES TO MAKE HERSELF FEEL BETTER. HAS SEEN A CHANGE IN COLOR OF HER STOOLS: ??GREY. LESS BMs DUE TO DECREASED PO INTAKE. GOT OUTPATIENT LABS DRAWN PRIOR TO RETURNING TO ED DUE TO UNCONTROLLED PAIN. HAD pRBCs WHEN RECEIVING CHEMO. NO ETOH OR RECREATIONAL DRUG USE. NO TATTOES.  PT DENIES FEVER, CHILLS, HEMATOCHEZIA,  melena, diarrhea, CHEST PAIN, SHORTNESS OF BREATH,  constipation, problems swallowing, problems with sedation,OR  heartburn or indigestion.  Past Medical History:  Diagnosis Date  . Anemia    As a child and while pregnant  . 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 Depression    parents died close together "was hard"  . Dyspnea    all the time .".due to the mass"  . Eczema   . Fibromyalgia   . Headache(784.0)    Birth control pills help  . Hemorrhoid   . History of radiation therapy  10/28/12-11/20/12   chest & neck,34Gy/17fx  . Hypertension    recently dx with hodgkins  . Restless leg   . S/P cholecystectomy 09/14/2013   08/29/2013 by Dr. Jenkins.  Negative pathology  . Sleep apnea    Sleep Study- 1996- "little apnea- no tx"    Past Surgical History:  Procedure Laterality Date  . BONE MARROW BIOPSY  03/20/2012  . CHOLECYSTECTOMY N/A 08/29/2013   Procedure: LAPAROSCOPIC CHOLECYSTECTOMY;  Surgeon: Mark A Jenkins, MD;  Location: [facility];  Service: General;  Laterality: N/A;  . DILATION AND CURETTAGE OF UTERUS  1991  . Mediastinal Mass Biopsy  03/05/12  . PORT-A-CATH REMOVAL Left 10/16/2012   Procedure: REMOVAL PORT-A-CATH;  Surgeon: Steven C Hendrickson, MD;  Location[facility];  Service: Thoracic;  Laterality: Left;  . PORTACATH PLACEMENT  03/26/2012   Procedure: INSERTION PORT-A-CATH;  Surgeon: Steven C Hendrickson, MD;  Location[facility];  Service: Thoracic;  Laterality: N/A;  . WISDOM TOOTH EXTRACTION      Prior to Admission medications   Medication Sig Start Date End Date Taking? Authorizing Provider  B Complex-C-E-Zn (B COMPLEX-C-E-ZINC) tablet Take 1 tablet by mouth daily. Reported on 09/03/2015   Yes Historical Provider, MD  cholecalciferol (VITAMIN D) 1000 UNITS tablet Take 1,000 Units by mouth daily. Reported on 09/03/2015   Yes Historical Provider, MD  Cyanocobalamin (VITAMIN B-12 PO) Take 1 capsule by mouth daily.   Yes Historical Provider, MD  cyclobenzaprine (FLEXERIL) 10 MG tablet Take 10 mg by mouth at bedtime.   Yes Historical Provider, MD  hydrochlorothiazide (HYDRODIURIL) 12.5 MG tablet Take   1 tablet (12.5 mg total) by mouth daily. Patient taking differently: Take 12.5 mg by mouth daily as needed (edema).  12/16/15  Yes Shaune Pascal Bensimhon, MD  Ibuprofen-Diphenhydramine Cit (ADVIL PM PO) Take 2 tablets by mouth at bedtime as needed (pain/sleep).    Yes Historical Provider, MD  JUNEL FE 1/20 1-20 MG-MCG tablet Take 1 tablet by mouth daily. 03/13/14  Yes Historical  Provider, MD  KRILL OIL PO Take 1 capsule by mouth daily.   Yes Historical Provider, MD  levothyroxine (SYNTHROID, LEVOTHROID) 125 MCG tablet Take 125 mcg by mouth daily before breakfast.   Yes Historical Provider, MD  MAGNESIUM PO Take 450 mg by mouth daily.   Yes Historical Provider, MD  Multiple Vitamins-Minerals (ANTIOXIDANT FORMULA) CAPS Take 1 capsule by mouth daily. Reported on 09/03/2015   Yes Historical Provider, MD  Multiple Vitamins-Minerals (HAIR/SKIN/NAILS/BIOTIN PO) Take 1 capsule by mouth daily. Reported on 09/03/2015   Yes Historical Provider, MD  piroxicam (FELDENE) 20 MG capsule Take 1 capsule by mouth daily as needed (plantar facitis.).  12/20/13  Yes Historical Provider, MD  temazepam (RESTORIL) 15 MG capsule Take 15 mg by mouth at bedtime as needed for sleep.   Yes Historical Provider, MD  tiZANidine (ZANAFLEX) 4 MG tablet Take 4 mg by mouth at bedtime. 12/25/12  Yes Baird Cancer, PA-C                  Current Outpatient Prescriptions  Medication Sig Dispense Refill  . B Complex-C-E-Zn (B COMPLEX-C-E-ZINC) tablet Take 1 tablet by mouth daily. Reported on 09/03/2015    . cholecalciferol (VITAMIN D) 1000 UNITS tablet Take 1,000 Units by mouth daily. Reported on 09/03/2015    . Cyanocobalamin (VITAMIN B-12 PO) Take 1 capsule by mouth daily.    . cyclobenzaprine (FLEXERIL) 10 MG tablet Take 10 mg by mouth at bedtime.    . hydrochlorothiazide (HYDRODIURIL) 12.5 MG tablet Take 1 tablet (12.5 mg total) by mouth daily. (Patient taking differently: Take 12.5 mg by mouth daily as needed (edema). )    . Ibuprofen-Diphenhydramine Cit (ADVIL PM PO) Take 2 tablets by mouth at bedtime as needed (pain/sleep).     Lenda Kelp FE 1/20 1-20 MG-MCG tablet Take 1 tablet by mouth daily.    Marland Kitchen KRILL OIL PO Take 1 capsule by mouth daily.    Marland Kitchen levothyroxine (SYNTHROID, LEVOTHROID) 125 MCG tablet Take 125 mcg by mouth daily before breakfast.    . MAGNESIUM PO Take 450 mg by mouth daily.    . Multiple  Vitamins-Minerals (ANTIOXIDANT FORMULA) CAPS Take 1 capsule by mouth daily. Reported on 09/03/2015    . Multiple Vitamins-Minerals (HAIR/SKIN/NAILS/BIOTIN PO) Take 1 capsule by mouth daily. Reported on 09/03/2015    . piroxicam (FELDENE) 20 MG capsule Take 1 capsule by mouth daily as needed (plantar facitis.).     Marland Kitchen temazepam (RESTORIL) 15 MG capsule Take 15 mg by mouth at bedtime as needed for sleep.    Marland Kitchen tiZANidine (ZANAFLEX) 4 MG tablet Take 4 mg by mouth at bedtime.    Marland Kitchen HYDROcodone-acetaminophen (NORCO/VICODIN) 5-325 MG tablet Take 2 tablets by mouth every 4 (four) hours as needed. (Patient not taking: Reported on 01/11/2016)    . promethazine (PHENERGAN) 12.5 MG tablet 1-2 PO 30 MINS PRIOR TO MEALS UP TO THREE TIMES A DAY. DOSES SHOULD BE 4 HRS APART. (Patient not taking: Reported on 01/11/2016)      Allergies as of 01/11/2016 - Review Complete 01/11/2016  Allergen Reaction Noted  .  Other Other (See Comments) 10/09/2012  . Sulfur Anaphylaxis 09/14/2010    Family History  Problem Relation Age of Onset  . Cancer Paternal Uncle   . Cancer Maternal Grandmother     lung ca mets to throat     Social History   Social History  . Marital status: Married    Spouse name: N/A  . Number of children: N/A  . Years of education: N/A   Occupational History  . Not on file.   Social History Main Topics  . Smoking status: Never Smoker  . Smokeless tobacco: Never Used  . Alcohol use 0.0 oz/week    0 drink(s) per week     Comment: occasionaly 2 x month; rarely  . Drug use: No  . Sexual activity: Yes    Birth control/ protection: Pill   Other Topics Concern  . 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 Lives in [place] with husband.     Review of Systems: PER HPI OTHERWISE ALL SYSTEMS ARE NEGATIVE.   Vitals: Blood pressure 143/84, pulse 85, temperature 97.3 F (36.3 C), temperature source Oral, resp. rate 16, height 5' 6.5" (1.689 m),  weight 212 lb (96.2 kg), last menstrual period 02/11/2012, SpO2 100 %.  Physical Exam: General:   Alert,  Well-developed, well-nourished, pleasant and cooperative in NAD Head:  Normocephalic and atraumatic. Eyes:  Sclera clear, icterus.   Conjunctiva pink. Mouth:  No lesions, dentition normal. Neck:  Supple; no masses. Lungs:  Clear throughout to auscultation.   No wheezes. No acute distress. Heart:  Regular rate and rhythm; no murmurs. Abdomen:  Soft, MILD TTP IN THE EPIGASTRIUM, nondistended. Normal bowel sounds, without guarding, and without rebound.   Msk:  Symmetrical without gross deformities. Normal posture. Extremities:  Without edema. Neurologic:  Alert and  oriented x4;  grossly normal neurologically. Cervical Nodes:  No significant cervical adenopathy. Psych:  Alert and cooperative. Normal mood and affect.   Lab Results:  Recent Labs  01/10/16 1225 01/11/16 1305  WBC 9.5 5.3  HGB 14.3 14.1  HCT 41.1 40.0  PLT 312 270   BMET  Recent Labs  01/10/16 1225 01/11/16 1305  NA 137 136  K 4.0 3.3*  CL 104 105  CO2 21* 19*  GLUCOSE 137* 116*  BUN 12 9  CREATININE 0.78 0.67  CALCIUM 9.7 9.3   LFT  Recent Labs  01/11/16 1305  PROT 7.9  ALBUMIN 4.3  AST 368*  ALT 565*  ALKPHOS 248*  BILITOT 6.3*     Studies/Results: I PERSONALLY REVIEWED THE CT WITH DR. Robinette Haines NL.     LOS: 0 days   Barney Drain  01/11/2016, 5:28 PM

## 2016-01-11 NOTE — Telephone Encounter (Signed)
APPT MADE

## 2016-01-12 ENCOUNTER — Inpatient Hospital Stay (HOSPITAL_COMMUNITY): Payer: Commercial Managed Care - PPO | Admitting: Anesthesiology

## 2016-01-12 ENCOUNTER — Inpatient Hospital Stay (HOSPITAL_COMMUNITY): Payer: Commercial Managed Care - PPO

## 2016-01-12 ENCOUNTER — Encounter (HOSPITAL_COMMUNITY)
Admission: EM | Disposition: A | Discharge status: Home / Self Care | Payer: Self-pay | Source: Home / Self Care | Attending: Internal Medicine

## 2016-01-12 ENCOUNTER — Ambulatory Visit (HOSPITAL_COMMUNITY): Payer: Commercial Managed Care - PPO | Attending: Gastroenterology

## 2016-01-12 ENCOUNTER — Encounter (HOSPITAL_COMMUNITY): Payer: Self-pay | Admitting: Anesthesiology

## 2016-01-12 DIAGNOSIS — R932 Abnormal findings on diagnostic imaging of liver and biliary tract: Secondary | ICD-10-CM

## 2016-01-12 DIAGNOSIS — I10 Essential (primary) hypertension: Secondary | ICD-10-CM

## 2016-01-12 DIAGNOSIS — K8051 Calculus of bile duct without cholangitis or cholecystitis with obstruction: Secondary | ICD-10-CM

## 2016-01-12 HISTORY — PX: SPHINCTEROTOMY: SHX5544

## 2016-01-12 HISTORY — PX: REMOVAL OF STONES: SHX5545

## 2016-01-12 HISTORY — PX: ERCP: SHX5425

## 2016-01-12 LAB — HEPATIC FUNCTION PANEL
ALT: 656 U/L — ABNORMAL HIGH (ref 14–54)
AST: 433 U/L — ABNORMAL HIGH (ref 15–41)
Albumin: 3.4 g/dL — ABNORMAL LOW (ref 3.5–5.0)
Alkaline Phosphatase: 229 U/L — ABNORMAL HIGH (ref 38–126)
Bilirubin, Direct: 2.4 mg/dL — ABNORMAL HIGH (ref 0.1–0.5)
Indirect Bilirubin: 1.7 mg/dL — ABNORMAL HIGH (ref 0.3–0.9)
Total Bilirubin: 4.1 mg/dL — ABNORMAL HIGH (ref 0.3–1.2)
Total Protein: 5.9 g/dL — ABNORMAL LOW (ref 6.5–8.1)

## 2016-01-12 LAB — PROTIME-INR
INR: 1
Prothrombin Time: 10.7 s (ref 9.0–11.5)

## 2016-01-12 LAB — IGG, IGA, IGM
IgA: 129 mg/dL (ref 81–463)
IgG (Immunoglobin G), Serum: 933 mg/dL (ref 694–1618)
IgM, Serum: 107 mg/dL (ref 48–271)

## 2016-01-12 LAB — CBC
HCT: 33.4 % — ABNORMAL LOW (ref 36.0–46.0)
Hemoglobin: 11.7 g/dL — ABNORMAL LOW (ref 12.0–15.0)
MCH: 32.6 pg (ref 26.0–34.0)
MCHC: 35 g/dL (ref 30.0–36.0)
MCV: 93 fL (ref 78.0–100.0)
Platelets: 190 10*3/uL (ref 150–400)
RBC: 3.59 MIL/uL — ABNORMAL LOW (ref 3.87–5.11)
RDW: 12.4 % (ref 11.5–15.5)
WBC: 4.5 10*3/uL (ref 4.0–10.5)

## 2016-01-12 LAB — HEPATITIS B SURFACE ANTIBODY,QUALITATIVE: Hep B S Ab: NEGATIVE

## 2016-01-12 LAB — ANTI-NUCLEAR AB-TITER (ANA TITER): ANA Titer 1: 1:320 {titer} — ABNORMAL HIGH

## 2016-01-12 LAB — HEPATITIS B SURFACE ANTIGEN: Hepatitis B Surface Ag: NEGATIVE

## 2016-01-12 LAB — HEPATITIS A ANTIBODY, TOTAL: Hep A Total Ab: REACTIVE — AB

## 2016-01-12 LAB — ANA: Anti Nuclear Antibody(ANA): POSITIVE — AB

## 2016-01-12 LAB — HEPATITIS C ANTIBODY: HCV Ab: NEGATIVE

## 2016-01-12 LAB — LIPASE, BLOOD: Lipase: 31 U/L (ref 11–51)

## 2016-01-12 SURGERY — ERCP, WITH INTERVENTION IF INDICATED
Anesthesia: General

## 2016-01-12 MED ORDER — LACTATED RINGERS IV SOLN
INTRAVENOUS | Status: DC
  Administered 2016-01-12: 12:00:00 via INTRAVENOUS

## 2016-01-12 MED ORDER — GLYCOPYRROLATE 0.2 MG/ML IJ SOLN
INTRAMUSCULAR | Status: AC
  Filled 2016-01-12: qty 2

## 2016-01-12 MED ORDER — GLUCAGON HCL RDNA (DIAGNOSTIC) 1 MG IJ SOLR
INTRAMUSCULAR | Status: AC
  Filled 2016-01-12: qty 2

## 2016-01-12 MED ORDER — LACTATED RINGERS IV SOLN
INTRAVENOUS | Status: DC
  Administered 2016-01-12 – 2016-01-13 (×2): via INTRAVENOUS

## 2016-01-12 MED ORDER — ONDANSETRON HCL 4 MG/2ML IJ SOLN
4.0000 mg | Freq: Once | INTRAMUSCULAR | Status: AC
  Administered 2016-01-12: 4 mg via INTRAVENOUS

## 2016-01-12 MED ORDER — NEOSTIGMINE METHYLSULFATE 10 MG/10ML IV SOLN
INTRAVENOUS | Status: DC | PRN
  Administered 2016-01-12: 3 mg via INTRAVENOUS

## 2016-01-12 MED ORDER — HYDROMORPHONE HCL 1 MG/ML IJ SOLN
1.0000 mg | Freq: Once | INTRAMUSCULAR | Status: AC
  Administered 2016-01-12: 1 mg via INTRAVENOUS
  Filled 2016-01-12: qty 1

## 2016-01-12 MED ORDER — FENTANYL CITRATE (PF) 100 MCG/2ML IJ SOLN
INTRAMUSCULAR | Status: AC
  Filled 2016-01-12: qty 2

## 2016-01-12 MED ORDER — FENTANYL CITRATE (PF) 100 MCG/2ML IJ SOLN
INTRAMUSCULAR | Status: DC | PRN
  Administered 2016-01-12 (×2): 25 ug via INTRAVENOUS
  Administered 2016-01-12: 50 ug via INTRAVENOUS

## 2016-01-12 MED ORDER — SODIUM CHLORIDE 0.9 % IV SOLN
1.5000 g | Freq: Once | INTRAVENOUS | Status: AC
  Administered 2016-01-12: 1.5 g via INTRAVENOUS
  Filled 2016-01-12: qty 1.5

## 2016-01-12 MED ORDER — FENTANYL CITRATE (PF) 100 MCG/2ML IJ SOLN
25.0000 ug | INTRAMUSCULAR | Status: DC | PRN
  Administered 2016-01-12 (×3): 50 ug via INTRAVENOUS

## 2016-01-12 MED ORDER — GLYCOPYRROLATE 0.2 MG/ML IJ SOLN
INTRAMUSCULAR | Status: AC
  Filled 2016-01-12: qty 1

## 2016-01-12 MED ORDER — ROCURONIUM BROMIDE 100 MG/10ML IV SOLN
INTRAVENOUS | Status: DC | PRN
  Administered 2016-01-12 (×2): 5 mg via INTRAVENOUS
  Administered 2016-01-12: 25 mg via INTRAVENOUS

## 2016-01-12 MED ORDER — LIDOCAINE VISCOUS 2 % MT SOLN: 15.0000 mL | Freq: Once | OROMUCOSAL | Status: DC

## 2016-01-12 MED ORDER — IOPAMIDOL (ISOVUE-300) INJECTION 61%
INTRAVENOUS | Status: AC
  Filled 2016-01-12: qty 100

## 2016-01-12 MED ORDER — OXYCODONE HCL 5 MG PO TABS
5.0000 mg | ORAL_TABLET | ORAL | Status: AC
  Administered 2016-01-13 (×2): 5 mg via ORAL
  Filled 2016-01-12 (×3): qty 1

## 2016-01-12 MED ORDER — LIDOCAINE HCL 1 % IJ SOLN
INTRAMUSCULAR | Status: DC | PRN
  Administered 2016-01-12: 25 mg via INTRADERMAL

## 2016-01-12 MED ORDER — ALUM & MAG HYDROXIDE-SIMETH 200-200-20 MG/5ML PO SUSP: 15.0000 mL | Freq: Four times a day (QID) | ORAL | Status: DC | PRN

## 2016-01-12 MED ORDER — MIDAZOLAM HCL 2 MG/2ML IJ SOLN
1.0000 mg | INTRAMUSCULAR | Status: DC | PRN
  Administered 2016-01-12 (×2): 1 mg via INTRAVENOUS
  Filled 2016-01-12: qty 2

## 2016-01-12 MED ORDER — GLYCOPYRROLATE 0.2 MG/ML IJ SOLN
INTRAMUSCULAR | Status: DC | PRN
  Administered 2016-01-12: 0.4 mg via INTRAVENOUS
  Administered 2016-01-12: 0.2 mg via INTRAVENOUS

## 2016-01-12 MED ORDER — ALUM & MAG HYDROXIDE-SIMETH 200-200-20 MG/5ML PO SUSP
15.0000 mL | Freq: Four times a day (QID) | ORAL | Status: DC | PRN
  Administered 2016-01-12: 15 mL via ORAL
  Filled 2016-01-12: qty 30

## 2016-01-12 MED ORDER — SIMETHICONE 40 MG/0.6ML PO SUSP: 40.0000 mg | Freq: Four times a day (QID) | ORAL | Status: DC | PRN

## 2016-01-12 MED ORDER — MIDAZOLAM HCL 2 MG/2ML IJ SOLN
INTRAMUSCULAR | Status: AC
  Filled 2016-01-12: qty 2

## 2016-01-12 MED ORDER — GLUCAGON HCL RDNA (DIAGNOSTIC) 1 MG IJ SOLR
INTRAMUSCULAR | Status: DC | PRN
  Administered 2016-01-12 (×2): .5 mg via INTRAVENOUS

## 2016-01-12 MED ORDER — MIDAZOLAM HCL 5 MG/5ML IJ SOLN
INTRAMUSCULAR | Status: DC | PRN
  Administered 2016-01-12 (×2): 1 mg via INTRAVENOUS
  Administered 2016-01-12: 2 mg via INTRAVENOUS

## 2016-01-12 MED ORDER — SODIUM CHLORIDE 0.9 % IV SOLN: INTRAVENOUS | Status: DC

## 2016-01-12 MED ORDER — ONDANSETRON HCL 4 MG/2ML IJ SOLN: 4.0000 mg | Freq: Once | INTRAMUSCULAR | Status: DC | PRN

## 2016-01-12 MED ORDER — IOPAMIDOL (ISOVUE-300) INJECTION 61%
INTRAVENOUS | Status: DC | PRN
  Administered 2016-01-12: 50 mL

## 2016-01-12 MED ORDER — HYDROMORPHONE HCL 1 MG/ML IJ SOLN
0.5000 mg | Freq: Four times a day (QID) | INTRAMUSCULAR | Status: DC
  Administered 2016-01-12 – 2016-01-13 (×3): 0.5 mg via INTRAVENOUS
  Filled 2016-01-12 (×3): qty 1

## 2016-01-12 MED ORDER — PROPOFOL 10 MG/ML IV BOLUS
INTRAVENOUS | Status: DC | PRN
  Administered 2016-01-12: 150 mg via INTRAVENOUS

## 2016-01-12 MED ORDER — NEOSTIGMINE METHYLSULFATE 10 MG/10ML IV SOLN
INTRAVENOUS | Status: AC
  Filled 2016-01-12: qty 1

## 2016-01-12 MED ORDER — OXYCODONE HCL 5 MG PO TABS: 5.0000 mg | ORAL_TABLET | ORAL | Status: AC

## 2016-01-12 MED ORDER — STERILE WATER FOR IRRIGATION IR SOLN
Status: DC | PRN
  Administered 2016-01-12: 1000 mL

## 2016-01-12 NOTE — Op Note (Signed)
[facility] Patient Name: Alanie Goossen Procedure Date: 01/12/2016 12:42 PM MRN: [id number] Date of Birth: [birth year] Attending MD: Barney Drain , MD CSN: [id number] Age: 45 Admit Type: Inpatient Procedure:                ERCP Indications:              Bile duct stone(s) SEEN ON MRCP ASSOCTITED WITH                            ELEVATED LIVER ENZYMES. I PERSONALLY REVOEWED CT &                            MRCP ITH DR. Pascal Lux PRIOR TO ERCP. CHANGES ON CT                            SUGGESTED POSSIBLE CHOLANGITIS. Providers:                Barney Drain, MD, Janeece Riggers, RN, Isabella Stalling,                            Technician Referring MD:              Medicines:                General Anesthesia Complications:            SPHINCTERTOME SNAPPED WHEN REMOVED FROM DISTAL BILE                            DUCT. NO APPARENT METAL FRAGMENTS REMAINIG IN                            PATIENT. ABDOMINAL PAIN ~1 HOUR AFTER ERCP COMPLETE. Estimated Blood Loss:     Estimated blood loss: none. Procedure:                Pre-Anesthesia Assessment:                           - Prior to the procedure, a History and Physical                            was performed, and patient medications and                            allergies were reviewed. The patient's tolerance of                            previous anesthesia was also reviewed. The risks                            and benefits of the procedure and the sedation                            options and risks were discussed with the patient.  All questions were answered, and informed consent                            was obtained. Prior Anticoagulants: The patient has                            taken no previous anticoagulant or antiplatelet                            agents. ASA Grade Assessment: I - A normal, healthy                            patient. After reviewing the risks and benefits,                            the  patient was deemed in satisfactory condition to                            undergo the procedure.                           After obtaining informed consent, the scope was                            passed under direct vision. Throughout the                            procedure, the patient's blood pressure, pulse, and                            oxygen saturations were monitored continuously. The                            [id number]) scope was introduced through                            the mouth, and used to inject contrast into and                            used to inject contrast into the bile duct. The                            ERCP was somewhat difficult due to unusual anatomy.                            The patient tolerated the procedure well. Scope In: 1:46:20 PM Scope Out: 2:52:19 PM Total Procedure Duration: 1 hour 5 minutes 59 seconds  Findings:      A scout film of the abdomen was obtained. Surgical clips, consistent       with a previous cholecystectomy, were seen in the area of the right       upper quadrant of the abdomen. The upper GI tract was traversed under       direct vision without detailed examination. The major  papilla was       normal. CLEAR BILE WAS DRAINING FROM THE ORIFICE. The bile duct was       deeply cannulated with 12-15 mm balloon, tapered-tip cannula and       Autotome sphincterotome. Contrast was injected & OCCLUSIVE CHOLANGIOGRAM       WAS PERFORMED. I personally interpreted the bile duct images. Image       quality was excellent. Contrast extended to the hepatic ducts. The main       bile duct, common hepatic duct and hepatic duct bifurcation were       diffusely dilated, with a stone causing an obstruction in the distal       commonbile duct. The largest diameter was 15 mm tapering to 10-11 mm.       The lower third of the main bile duct contained filling defect(s). The       biliary orifice was stenotic. This appeared benign. A wire was  passed       into the biliary tree. A 10 mm biliary sphincterotomy was made with a       traction (standard) sphincterotome using FIRST PURE CUT THEN blended       current. There was no post-sphincterotomy bleeding. The biliary tree was       swept MULTIPLE TIMES with a 12-15 MM TO TEH DISTAL COMMON DUCT THEN A       9-12 mm balloon starting at the MIDDLE third of the main duct AND PULLED       THROUGH THE BILIARY ORIFICE. NO PURULENT DISCHARGE SEEN. Two stones were       removed. No stones remained. THE PANCREATIC DUCT WAS NOT CANNULATED       INTETIONALLY. THE DISTAL BILE DUCT APPEARED TORTUOUS. Impression:               - The major papilla appeared normal.                           - TWO filling defects consistent with a stone was                            seen on the cholangiogram.                           - The entire main bile duct and common hepatic duct                            were dilated, with a stone causing an obstruction. Moderate Sedation:      Per Anesthesia Care Recommendation:           - Return patient to hospital ward.                           - NPO EXCEPT SIPS MITH MEDS.                           - STAT LIPASE/HFP AND LEFT LATERAL KUB.                           - REPEAT HFP IN AM. Procedure Code(s):        --- Professional ---  [phone], Endoscopic retrograde                            cholangiopancreatography (ERCP); with removal of                            calculi/debris from biliary/pancreatic duct(s)                           [id number], Endoscopic retrograde                            cholangiopancreatography (ERCP); with                            sphincterotomy/papillotomy Diagnosis Code(s):        --- Professional ---                           K80.51, Calculus of bile duct without cholangitis                            or cholecystitis with obstruction                           R93.2, Abnormal findings on diagnostic imaging of                             liver and biliary tract CPT copyright 2016 American Medical Association. All rights reserved. The codes documented in this report are preliminary and upon coder review may  be revised to meet current compliance requirements. Barney Drain, MD Barney Drain, MD 01/12/2016 3:46:55 PM This report has been signed electronically. Number of Addenda: 0

## 2016-01-12 NOTE — H&P (View-Only) (Signed)
Referring Provider: No ref. provider found Primary Care Physician:  [name], MD Primary Gastroenterologist:  Barney Drain  Reason for Consultation:  ABDOMINAL PAIN/ELEVATED LIVER ENZYMES   Impression: ADMITTED WITH MIXED PATTERN ELEVATION IN LIVER ENZYMES.  DIFFERENTIAL DIAGNOSIS INCLUDES: DISTAL CBD STONE/SLUDGE/MICROLITHIASIS WITH BENIGN STRICTURE, AUTOIMMUNE HEPATITIS WITH OVERLAP SYNDROME, LESS LIKELY VIRAL HEPATITIS OR INFILTRATIVE LIVER DISEASE(SARCOID, AMYLOID, LYMPHOMA).  Plan: 1. NPO EXCEPT SIPS WITH MEDS 2. ZOFRAN ATC. PRN PAIN MEDS 3. SCDs FOR DVT PROPHYLAXIS 4. MRCP TONIGHT. PLAN ERCP/?ABX PROPHYLAXIS IF DISTAL CBD STONE/STRICTURE. DISCUSSED PROCEDURE, BENEFITS, & RISKS: < 1% chance of medication reaction, bleeding, perforation, or 5% CHANCE OF PANCREATITIS.     HPI:  PT HAS PMHx: LYMPHOMA. LAST PET SCAN MAR 2017: NO DISEASE. PT HAS BEEN DISEASE FREE FOR 3 YEARS. HAD GB REMOVED FOR STONES IN 2015. FOR PAST 18 DAYS HAS HAD SHARP EPIGASTRIC PAIN. NO RADIATION. GETS WORSE WITH FOOD AND ASSOCIATED WITH NAUSEA. INDUCED VOMITING A COUPLE OF TIMES TO MAKE HERSELF FEEL BETTER. HAS SEEN A CHANGE IN COLOR OF HER STOOLS: ??GREY. LESS BMs DUE TO DECREASED PO INTAKE. GOT OUTPATIENT LABS DRAWN PRIOR TO RETURNING TO ED DUE TO UNCONTROLLED PAIN. HAD pRBCs WHEN RECEIVING CHEMO. NO ETOH OR RECREATIONAL DRUG USE. NO TATTOES.  PT DENIES FEVER, CHILLS, HEMATOCHEZIA,  melena, diarrhea, CHEST PAIN, SHORTNESS OF BREATH,  constipation, problems swallowing, problems with sedation,OR  heartburn or indigestion.  Past Medical History:  Diagnosis Date  . Anemia    As a child and while pregnant  . 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 Depression    parents died close together "was hard"  . Dyspnea    all the time .".due to the mass"  . Eczema   . Fibromyalgia   . Headache(784.0)    Birth control pills help  . Hemorrhoid   . History of radiation therapy  10/28/12-11/20/12   chest & neck,34Gy/53f  . Hypertension    recently dx with hodgkins  . Restless leg   . S/P cholecystectomy 09/14/2013   08/29/2013 by Dr. JArnoldo Morale  Negative pathology  . Sleep apnea    Sleep Study- 1996- "little apnea- no tx"    Past Surgical History:  Procedure Laterality Date  . BONE MARROW BIOPSY  03/20/2012  . CHOLECYSTECTOMY N/A 08/29/2013   Procedure: LAPAROSCOPIC CHOLECYSTECTOMY;  Surgeon: MJamesetta So MD;  Location: [facility];  Service: General;  Laterality: N/A;  . DILATION AND CURETTAGE OF UTERUS  1991  . Mediastinal Mass Biopsy  03/05/12  . PORT-A-CATH REMOVAL Left 10/16/2012   Procedure: REMOVAL PORT-A-CATH;  Surgeon: SMelrose Nakayama MD;  [facility]  Service: Thoracic;  Laterality: Left;  . PORTACATH PLACEMENT  03/26/2012   Procedure: INSERTION PORT-A-CATH;  Surgeon: SMelrose Nakayama MD;  [facility]  Service: Thoracic;  Laterality: N/A;  . WISDOM TOOTH EXTRACTION      Prior to Admission medications   Medication Sig Start Date End Date Taking? Authorizing Provider  B Complex-C-E-Zn (B COMPLEX-C-E-ZINC) tablet Take 1 tablet by mouth daily. Reported on 09/03/2015   Yes Historical Provider, MD  cholecalciferol (VITAMIN D) 1000 UNITS tablet Take 1,000 Units by mouth daily. Reported on 09/03/2015   Yes Historical Provider, MD  Cyanocobalamin (VITAMIN B-12 PO) Take 1 capsule by mouth daily.   Yes Historical Provider, MD  cyclobenzaprine (FLEXERIL) 10 MG tablet Take 10 mg by mouth at bedtime.   Yes Historical Provider, MD  hydrochlorothiazide (HYDRODIURIL) 12.5 MG tablet Take  1 tablet (12.5 mg total) by mouth daily. Patient taking differently: Take 12.5 mg by mouth daily as needed (edema).  12/16/15  Yes Shaune Pascal Bensimhon, MD  Ibuprofen-Diphenhydramine Cit (ADVIL PM PO) Take 2 tablets by mouth at bedtime as needed (pain/sleep).    Yes Historical Provider, MD  JUNEL FE 1/20 1-20 MG-MCG tablet Take 1 tablet by mouth daily. 03/13/14  Yes Historical  Provider, MD  KRILL OIL PO Take 1 capsule by mouth daily.   Yes Historical Provider, MD  levothyroxine (SYNTHROID, LEVOTHROID) 125 MCG tablet Take 125 mcg by mouth daily before breakfast.   Yes Historical Provider, MD  MAGNESIUM PO Take 450 mg by mouth daily.   Yes Historical Provider, MD  Multiple Vitamins-Minerals (ANTIOXIDANT FORMULA) CAPS Take 1 capsule by mouth daily. Reported on 09/03/2015   Yes Historical Provider, MD  Multiple Vitamins-Minerals (HAIR/SKIN/NAILS/BIOTIN PO) Take 1 capsule by mouth daily. Reported on 09/03/2015   Yes Historical Provider, MD  piroxicam (FELDENE) 20 MG capsule Take 1 capsule by mouth daily as needed (plantar facitis.).  12/20/13  Yes Historical Provider, MD  temazepam (RESTORIL) 15 MG capsule Take 15 mg by mouth at bedtime as needed for sleep.   Yes Historical Provider, MD  tiZANidine (ZANAFLEX) 4 MG tablet Take 4 mg by mouth at bedtime. 12/25/12  Yes Baird Cancer, PA-C                  Current Outpatient Prescriptions  Medication Sig Dispense Refill  . B Complex-C-E-Zn (B COMPLEX-C-E-ZINC) tablet Take 1 tablet by mouth daily. Reported on 09/03/2015    . cholecalciferol (VITAMIN D) 1000 UNITS tablet Take 1,000 Units by mouth daily. Reported on 09/03/2015    . Cyanocobalamin (VITAMIN B-12 PO) Take 1 capsule by mouth daily.    . cyclobenzaprine (FLEXERIL) 10 MG tablet Take 10 mg by mouth at bedtime.    . hydrochlorothiazide (HYDRODIURIL) 12.5 MG tablet Take 1 tablet (12.5 mg total) by mouth daily. (Patient taking differently: Take 12.5 mg by mouth daily as needed (edema). )    . Ibuprofen-Diphenhydramine Cit (ADVIL PM PO) Take 2 tablets by mouth at bedtime as needed (pain/sleep).     Lenda Kelp FE 1/20 1-20 MG-MCG tablet Take 1 tablet by mouth daily.    Marland Kitchen KRILL OIL PO Take 1 capsule by mouth daily.    Marland Kitchen levothyroxine (SYNTHROID, LEVOTHROID) 125 MCG tablet Take 125 mcg by mouth daily before breakfast.    . MAGNESIUM PO Take 450 mg by mouth daily.    . Multiple  Vitamins-Minerals (ANTIOXIDANT FORMULA) CAPS Take 1 capsule by mouth daily. Reported on 09/03/2015    . Multiple Vitamins-Minerals (HAIR/SKIN/NAILS/BIOTIN PO) Take 1 capsule by mouth daily. Reported on 09/03/2015    . piroxicam (FELDENE) 20 MG capsule Take 1 capsule by mouth daily as needed (plantar facitis.).     Marland Kitchen temazepam (RESTORIL) 15 MG capsule Take 15 mg by mouth at bedtime as needed for sleep.    Marland Kitchen tiZANidine (ZANAFLEX) 4 MG tablet Take 4 mg by mouth at bedtime.    Marland Kitchen HYDROcodone-acetaminophen (NORCO/VICODIN) 5-325 MG tablet Take 2 tablets by mouth every 4 (four) hours as needed. (Patient not taking: Reported on 01/11/2016)    . promethazine (PHENERGAN) 12.5 MG tablet 1-2 PO 30 MINS PRIOR TO MEALS UP TO THREE TIMES A DAY. DOSES SHOULD BE 4 HRS APART. (Patient not taking: Reported on 01/11/2016)      Allergies as of 01/11/2016 - Review Complete 01/11/2016  Allergen Reaction Noted  .  Other Other (See Comments) 10/09/2012  . Sulfur Anaphylaxis 09/14/2010    Family History  Problem Relation Age of Onset  . Cancer Paternal Uncle   . Cancer Maternal Grandmother     lung ca mets to throat     Social History   Social History  . Marital status: Married    Spouse name: N/A  . Number of children: N/A  . Years of education: N/A   Occupational History  . Not on file.   Social History Main Topics  . Smoking status: Never Smoker  . Smokeless tobacco: Never Used  . Alcohol use 0.0 oz/week    0 drink(s) per week     Comment: occasionaly 2 x month; rarely  . Drug use: No  . Sexual activity: Yes    Birth control/ protection: Pill   Other Topics Concern  . 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 Lives in [place] with husband.     Review of Systems: PER HPI OTHERWISE ALL SYSTEMS ARE NEGATIVE.   Vitals: Blood pressure 143/84, pulse 85, temperature 97.3 F (36.3 C), temperature source Oral, resp. rate 16, height 5' 6.5" (1.689 m),  weight 212 lb (96.2 kg), last menstrual period 02/11/2012, SpO2 100 %.  Physical Exam: General:   Alert,  Well-developed, well-nourished, pleasant and cooperative in NAD Head:  Normocephalic and atraumatic. Eyes:  Sclera clear, icterus.   Conjunctiva pink. Mouth:  No lesions, dentition normal. Neck:  Supple; no masses. Lungs:  Clear throughout to auscultation.   No wheezes. No acute distress. Heart:  Regular rate and rhythm; no murmurs. Abdomen:  Soft, MILD TTP IN THE EPIGASTRIUM, nondistended. Normal bowel sounds, without guarding, and without rebound.   Msk:  Symmetrical without gross deformities. Normal posture. Extremities:  Without edema. Neurologic:  Alert and  oriented x4;  grossly normal neurologically. Cervical Nodes:  No significant cervical adenopathy. Psych:  Alert and cooperative. Normal mood and affect.   Lab Results:  Recent Labs  01/10/16 1225 01/11/16 1305  WBC 9.5 5.3  HGB 14.3 14.1  HCT 41.1 40.0  PLT 312 270   BMET  Recent Labs  01/10/16 1225 01/11/16 1305  NA 137 136  K 4.0 3.3*  CL 104 105  CO2 21* 19*  GLUCOSE 137* 116*  BUN 12 9  CREATININE 0.78 0.67  CALCIUM 9.7 9.3   LFT  Recent Labs  01/11/16 1305  PROT 7.9  ALBUMIN 4.3  AST 368*  ALT 565*  ALKPHOS 248*  BILITOT 6.3*     Studies/Results: I PERSONALLY REVIEWED THE CT WITH DR. Robinette Haines NL.     LOS: 0 days   Barney Drain  01/11/2016, 5:28 PM

## 2016-01-12 NOTE — Addendum Note (Signed)
Addendum  created 01/12/16 1555 by Ollen Bowl, CRNA   Anesthesia Intra Meds edited

## 2016-01-12 NOTE — Anesthesia Postprocedure Evaluation (Signed)
Anesthesia Post Note  Patient: [name] [name]  Procedure(s) Performed: Procedure(s) (LRB): ENDOSCOPIC RETROGRADE CHOLANGIOPANCREATOGRAPHY (ERCP) (N/A) SPHINCTEROTOMY (N/A) REMOVAL OF STONES (N/A)  Patient location during evaluation: PACU Anesthesia Type: General Level of consciousness: awake and alert Pain management: pain level controlled Vital Signs Assessment: post-procedure vital signs reviewed and stable Respiratory status: spontaneous breathing Cardiovascular status: stable Anesthetic complications: no    Last Vitals:  Vitals:   01/12/16 1300 01/12/16 1505  BP: (!) 140/93 131/89  Pulse:  89  Resp: 15 13  Temp:  36.9 C    Last Pain:  Vitals:   01/12/16 1130  TempSrc: Oral  PainSc:                  Drucie Opitz

## 2016-01-12 NOTE — Transfer of Care (Signed)
Immediate Anesthesia Transfer of Care Note  Patient: SHAVAWN GAUTNEY  Procedure(s) Performed: Procedure(s): ENDOSCOPIC RETROGRADE CHOLANGIOPANCREATOGRAPHY (ERCP) (N/A) SPHINCTEROTOMY (N/A) REMOVAL OF STONES (N/A)  Patient Location: PACU  Anesthesia Type:General  Level of Consciousness: awake and patient cooperative  Airway & Oxygen Therapy: Patient Spontanous Breathing  Post-op Assessment: Report given to RN, Post -op Vital signs reviewed and stable and Patient moving all extremities  Post vital signs: Reviewed and stable  Last Vitals:  Vitals:   01/12/16 1255 01/12/16 1300  BP: (!) 144/99 (!) 140/93  Pulse:    Resp: 17 15  Temp:      Last Pain:  Vitals:   01/12/16 1130  TempSrc: Oral  PainSc:       Patients Stated Pain Goal: 5 ([id number])  Complications: No apparent anesthesia complications

## 2016-01-12 NOTE — Interval H&P Note (Signed)
History and Physical Interval Note:  01/12/2016 11:48 AM  [name] [name]  has presented today for surgery, with the diagnosis of CBD STONE  The various methods of treatment have been discussed with the patient and family. After consideration of risks, benefits and other options for treatment, the patient has consented to  Procedure(s): ENDOSCOPIC RETROGRADE CHOLANGIOPANCREATOGRAPHY (ERCP) (N/A) SPHINCTEROTOMY (N/A) REMOVAL OF STONES (N/A) as a surgical intervention .  The patient's history has been reviewed, patient examined, no change in status, stable for surgery.  I have reviewed the patient's chart and labs.  Questions were answered to the patient's satisfaction.     [employer]

## 2016-01-12 NOTE — Plan of Care (Addendum)
45 mins after ERCP/SPHINCETROTOMY, PT DEVELOP SEVERECRAMPY EPIGASTRIC PAIN. GIVEN FENTANYL 150 MCG IN PACU. PAIN DECREASED BUT NOT RESOLVED. STAT HFP/LIPASE SLIGHTLY IMPROVED/UNCHANGED WHEN COMPARED TO LABS PRIOR TO ERCP. KUB: NO FREE AIR. RETURNED TO FLOOR WITH ATC DILAUDID. CALLED TO CHECK ON PT @ 1930. PAIN STILL UNCONTROLLED. HOSPITALIST ORDERED MORPHINE/OXYCODONE/ZOFRAN. EXAMINED PT @2115 . NAUSEA RESOLVED. CRAMPY ABDOMINAL PAIN RELIEVED AND NOW JUST HAS MILD DISCOMFORT. PE: ABDOMEN: NON-TENDER, BS PRESENT, NO REBOUND OR GUARDING, TENS UNIT IN PLACE. NURSING HAS MY CELL PHONE. IF CONDITION CHANGES THEY WILL CALL ME. PT AWARE OF PLAN. HFP/LIPASE IN AM. NPO EXCEPT SIPS WITH MEDS TONIGHT. OXYCODONE 5 MG Q4H X3.

## 2016-01-12 NOTE — Anesthesia Procedure Notes (Signed)
Procedure Name: Intubation Date/Time: 01/12/2016 1:14 PM Performed by: Charmaine Downs Pre-anesthesia Checklist: Emergency Drugs available, Patient identified, Timeout performed, Suction available and Patient being monitored Patient Re-evaluated:Patient Re-evaluated prior to inductionOxygen Delivery Method: Circle system utilized Ventilation: Mask ventilation without difficulty Laryngoscope Size: Mac and 3 Grade View: Grade I Tube size: 7.0 mm Number of attempts: 1 Airway Equipment and Method: Stylet Placement Confirmation: ETT inserted through vocal cords under direct vision,  positive ETCO2 and breath sounds checked- equal and bilateral Secured at: 20 cm Tube secured with: Tape Dental Injury: Teeth and Oropharynx as per pre-operative assessment

## 2016-01-12 NOTE — Addendum Note (Signed)
Addendum  created 01/12/16 1611 by Ollen Bowl, CRNA   Anesthesia Event edited, Anesthesia Intra Meds edited

## 2016-01-12 NOTE — Plan of Care (Signed)
MRCP-TWO CBD STONES. ERCP TODAY.

## 2016-01-12 NOTE — Progress Notes (Signed)
Discussed MRCP results at bedside with patient. Discussed plans for ERCP by Dr. Oneida Alar today. Reviewed procedure again, benefits, and risks as already outlined in consult note (see 01/11/16). Patient and husband stated understanding and in agreement.  Orvil Feil, ANP-BC [facility]

## 2016-01-12 NOTE — Anesthesia Preprocedure Evaluation (Addendum)
Anesthesia Evaluation  Patient identified by MRN, date of birth, ID band Patient awake    Reviewed: Allergy & Precautions, H&P , NPO status , Patient's Chart, lab work & pertinent test results, reviewed documented beta blocker date and time   Airway Mallampati: II  TM Distance: >3 FB Neck ROM: full    Dental  (+) Teeth Intact   Pulmonary shortness of breath, sleep apnea , pneumonia, resolved,    breath sounds clear to auscultation       Cardiovascular hypertension, On Medications +CHF  + Valvular Problems/Murmurs  Rhythm:regular Rate:Normal     Neuro/Psych  Headaches, PSYCHIATRIC DISORDERS Depression  Neuromuscular disease    GI/Hepatic negative GI ROS, Neg liver ROS,   Endo/Other  negative endocrine ROS  Renal/GU negative Renal ROS  negative genitourinary   Musculoskeletal  (+) Fibromyalgia -  Abdominal   Peds  Hematology  (+) anemia ,   Anesthesia Other Findings Hx Bleomycin chemo Rx  Reproductive/Obstetrics negative OB ROS                             Anesthesia Physical Anesthesia Plan  ASA: III  Anesthesia Plan: General   Post-op Pain Management:    Induction: Intravenous  Airway Management Planned: Oral ETT  Additional Equipment:   Intra-op Plan:   Post-operative Plan: Extubation in OR  Informed Consent: I have reviewed the patients History and Physical, chart, labs and discussed the procedure including the risks, benefits and alternatives for the proposed anesthesia with the patient or authorized representative who has indicated his/her understanding and acceptance.     Plan Discussed with:   Anesthesia Plan Comments: (Hx Bleomycin chemo.)        Anesthesia Quick Evaluation

## 2016-01-12 NOTE — Progress Notes (Signed)
PROGRESS NOTE  [name] [name]  [id number] DOB: 1970/11/03 DOA: 01/11/2016 PCP: [name], MD Outpatient Specialists:  Subjective: Seen with husband at bedside, was complaining about RUQ to epigastric abdominal pain. Denies any fever or chills.  Brief Narrative:  45 year old female with past medical history of cholecystectomy came in with RUQ abdominal pain, transaminitis and hyperbilirubinemia, no fever or chills or leukocytosis. But complains about typical biliary colic type of pain.  Assessment & Plan:   Principal Problem:   Abdominal pain Active Problems:   Hypertension   Elevated LFTs   High bilirubin   Choledocholithiasis -Patient presented with RUQ abdominal pain with an elevated LFTs. -Initially CT without acute findings, MRCP was done showed 2 CBD stones. -Dr. Oneida Alar did ERCP earlier today 01/12/16 with extraction of 2 stones and sphincterotomy. -Follow LFTs in a.m. -Patient was NPO since admission, was on IV fluids at the rate of 125 mL/hour. -For pain control she required IV hydromorphone and morphine to control the pain. -Required general anesthesia for the ERCP today.  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    DVT prophylaxis: Subcutaneous heparin Code Status: Full Code Family Communication:  Disposition Plan:  Diet: Diet NPO time specified  Consultants:   GI  Procedures:   ERCP  Antimicrobials:      Objective: Vitals:   01/12/16 1250 01/12/16 1255 01/12/16 1300 01/12/16 1505  BP: (!) 144/96 (!) 144/99 (!) 140/93 131/89  Pulse:    89  Resp: (!) 22 17 15 13   Temp:    98.5 F (36.9 C)  TempSrc:      SpO2: 99% 99% 99%   Weight:      Height:        Intake/Output Summary (Last 24 hours) at 01/12/16 1527 Last data filed at 01/12/16 1505  Gross per 24 hour  Intake              700 ml  Output              1350 ml  Net             -650 ml   Filed Weights   01/11/16 1258 01/11/16 1856 01/12/16 1130  Weight: 96.2 kg (212 lb) 95.7 kg (211 lb) 95.7 kg (211 lb)    Examination: General exam: Appears calm and comfortable  Respiratory system: Clear to auscultation. Respiratory effort normal. Cardiovascular system: S1 & S2 heard, RRR. No JVD, murmurs, rubs, gallops or clicks. No pedal edema. Gastrointestinal system: Abdomen is nondistended, soft and nontender. No organomegaly or masses felt. Normal bowel sounds heard. Central nervous system: Alert and oriented. No focal neurological deficits. Extremities: Symmetric 5 x 5 power. Skin: No rashes, lesions or ulcers Psychiatry: Judgement and insight appear normal. Mood & affect appropriate.   Data Reviewed: I have personally reviewed following labs and imaging studies  CBC:  Recent Labs Lab 01/10/16 1225 01/11/16 1305 01/12/16 0544  WBC 9.5 5.3 4.5  NEUTROABS  --  4.2  --   HGB 14.3 14.1 11.7*  HCT 41.1 40.0 33.4*  MCV 91.7 91.5 93.0  PLT 312 270 [id number]   Basic Metabolic Panel:  Recent Labs Lab 01/10/16 1225 01/11/16 1305 01/11/16 2137  NA 137 136 137  K 4.0 3.3* 3.9  CL 104 105 110  CO2 21* 19* 24  GLUCOSE 137* 116* 89  BUN 12 9 9   CREATININE  0.78 0.67 0.66  CALCIUM 9.7 9.3 8.2*   GFR: Estimated Creatinine Clearance: 102.5 mL/min (by C-G formula based on SCr of 0.8 mg/dL). Liver Function Tests:  Recent Labs Lab 01/10/16 1225 01/11/16 1305 01/11/16 2137  AST 230* 368* 390*  ALT 403* 565* 580*  ALKPHOS 244* 248* 210*  BILITOT 3.9* 6.3* 5.3*  PROT 7.7 7.9 6.4*  ALBUMIN 4.4 4.3 3.5    Recent Labs Lab 01/10/16 1225 01/11/16 1305  LIPASE 23 30   No results for input(s): AMMONIA in the last 168 hours. Coagulation Profile:  Recent Labs Lab 01/11/16 0936  INR 1.0   Cardiac Enzymes: No results for input(s): CKTOTAL, CKMB, CKMBINDEX, TROPONINI in the last 168 hours. BNP (last 3 results) No results for  input(s): PROBNP in the last 8760 hours. HbA1C: No results for input(s): HGBA1C in the last 72 hours. CBG: No results for input(s): GLUCAP in the last 168 hours. Lipid Profile: No results for input(s): CHOL, HDL, LDLCALC, TRIG, CHOLHDL, LDLDIRECT in the last 72 hours. Thyroid Function Tests:  Recent Labs  01/11/16 1302  TSH 0.548   Anemia Panel: No results for input(s): VITAMINB12, FOLATE, FERRITIN, TIBC, IRON, RETICCTPCT in the last 72 hours. Urine analysis:    Component Value Date/Time   COLORURINE AMBER (A) 01/11/2016 1335   APPEARANCEUR CLEAR 01/11/2016 1335   LABSPEC >1.030 (H) 01/11/2016 1335   PHURINE 5.5 01/11/2016 1335   GLUCOSEU 100 (A) 01/11/2016 1335   HGBUR MODERATE (A) 01/11/2016 1335   BILIRUBINUR LARGE (A) 01/11/2016 1335   KETONESUR >80 (A) 01/11/2016 1335   PROTEINUR 30 (A) 01/11/2016 1335   UROBILINOGEN 0.2 09/27/2012 1249   NITRITE NEGATIVE 01/11/2016 1335   LEUKOCYTESUR NEGATIVE 01/11/2016 1335   Sepsis Labs: @LABRCNTIP (procalcitonin:4,lacticidven:4)  )No results found for this or any previous visit (from the past 240 hour(s)).   Invalid input(s): PROCALCITONIN, LACTICACIDVEN   Radiology Studies: Ct Abdomen Pelvis W Contrast  Result Date: 01/10/2016 CLINICAL DATA:  Severe epigastric pain. Elevated bilirubin. History lymphoma EXAM: CT ABDOMEN AND PELVIS WITH CONTRAST TECHNIQUE: Multidetector CT imaging of the abdomen and pelvis was performed using the standard protocol following bolus administration of intravenous contrast. CONTRAST:  146mL ISOVUE-300 IOPAMIDOL (ISOVUE-300) INJECTION 61% COMPARISON:  CT abdomen pelvis 12/30/2015 FINDINGS: Lower chest:  Lung bases clear without infiltrate or effusion. Hepatobiliary: Cholecystectomy. Normal-appearing liver. No biliary dilatation. Mild biliary dilatation seen previously has resolved. Pancreas: Normal pancreas without evidence of pancreatitis. No pancreatic calcifications or masses. Spleen: Negative  Adrenals/Urinary Tract: Symmetric excretion of contrast by both kidneys. No renal obstruction or mass. Normal urinary bladder. Stomach/Bowel: Negative for bowel obstruction. No bowel edema. Stomach and duodenum normal without evidence of edema. Negative for diverticulitis. Normal appendix. Vascular/Lymphatic: Normal aorta and IVC. No lymphadenopathy or mass lesion. Reproductive: Normal uterus.  No adnexal mass. Other: No free fluid.  No free air. Musculoskeletal: No acute skeletal abnormality. IMPRESSION: No cause for acute abdominal pain. Postop cholecystectomy. Bile ducts are nondilated and appear decompressed compared with the recent CT when there was mild prominence of the bile ducts Normal appendix. Electronically Signed   By: Franchot Gallo M.D.   On: 01/10/2016 16:47   Mr 3d Recon At Scanner  Result Date: 01/12/2016 CLINICAL DATA:  Right upper quadrant abdominal pain for 2 days. History of prior cholecystectomy. History of lymphoma. EXAM: MRI ABDOMEN WITHOUT AND WITH CONTRAST (INCLUDING MRCP) TECHNIQUE: Multiplanar multisequence MR imaging of the abdomen was performed both before and after the administration of intravenous contrast. Heavily T2-weighted images of  the biliary and pancreatic ducts were obtained, and three-dimensional MRCP images were rendered by post processing. CONTRAST:  70mL MULTIHANCE GADOBENATE DIMEGLUMINE 529 MG/ML IV SOLN COMPARISON:  CT scan 01/10/2016 FINDINGS: Lower chest: The lung bases are clear. No pulmonary lesions, pleural or pericardial effusion. Hepatobiliary: No focal hepatic lesions or intrahepatic biliary dilatation. The portal and hepatic veins are patent. The gallbladder is surgically absent. Mild common bile duct dilatation with maximum measurement of 10 mm. There is mild extrinsic compression which appears to be caused by the crossing hepatic artery. There are 2 small common bile duct stones. More distal stone measures approximately 4.5 mm. Pancreas: No mass,  inflammation or ductal dilatation. Spleen: Normal size.  No focal lesions. Adrenals/Urinary Tract: The adrenal glands and kidneys are normal. Stomach/Bowel: The stomach, duodenum, visualized small bowel and visualized colon are unremarkable. No inflammatory changes, mass lesions or obstructive findings. Vascular/Lymphatic: The aorta is normal in caliber. The branch vessels are normal. No mesenteric or retroperitoneal mass or lymphadenopathy. Other: No ascites or abdominal wall hernia. Musculoskeletal: No significant bony findings. IMPRESSION: Status post cholecystectomy with mild biliary dilatation. There are 2 small common bile duct stones as above. The solid abdominal organs are unremarkable. No mass and no adenopathy. Electronically Signed   By: Marijo Sanes M.D.   On: 01/12/2016 08:20   Mr Jeananne Rama W/wo Cm/mrcp  Result Date: 01/12/2016 CLINICAL DATA:  Right upper quadrant abdominal pain for 2 days. History of prior cholecystectomy. History of lymphoma. EXAM: MRI ABDOMEN WITHOUT AND WITH CONTRAST (INCLUDING MRCP) TECHNIQUE: Multiplanar multisequence MR imaging of the abdomen was performed both before and after the administration of intravenous contrast. Heavily T2-weighted images of the biliary and pancreatic ducts were obtained, and three-dimensional MRCP images were rendered by post processing. CONTRAST:  58mL MULTIHANCE GADOBENATE DIMEGLUMINE 529 MG/ML IV SOLN COMPARISON:  CT scan 01/10/2016 FINDINGS: Lower chest: The lung bases are clear. No pulmonary lesions, pleural or pericardial effusion. Hepatobiliary: No focal hepatic lesions or intrahepatic biliary dilatation. The portal and hepatic veins are patent. The gallbladder is surgically absent. Mild common bile duct dilatation with maximum measurement of 10 mm. There is mild extrinsic compression which appears to be caused by the crossing hepatic artery. There are 2 small common bile duct stones. More distal stone measures approximately 4.5 mm. Pancreas: No  mass, inflammation or ductal dilatation. Spleen: Normal size.  No focal lesions. Adrenals/Urinary Tract: The adrenal glands and kidneys are normal. Stomach/Bowel: The stomach, duodenum, visualized small bowel and visualized colon are unremarkable. No inflammatory changes, mass lesions or obstructive findings. Vascular/Lymphatic: The aorta is normal in caliber. The branch vessels are normal. No mesenteric or retroperitoneal mass or lymphadenopathy. Other: No ascites or abdominal wall hernia. Musculoskeletal: No significant bony findings. IMPRESSION: Status post cholecystectomy with mild biliary dilatation. There are 2 small common bile duct stones as above. The solid abdominal organs are unremarkable. No mass and no adenopathy. Electronically Signed   By: Marijo Sanes M.D.   On: 01/12/2016 08:20        Scheduled Meds: . [MAR Hold] B-complex with vitamin C  1 tablet Oral Daily  . [MAR Hold] cholecalciferol  1,000 Units Oral Daily  . [MAR Hold] cyclobenzaprine  10 mg Oral QHS  . glucagon (human recombinant)      . iopamidol      . [MAR Hold] levothyroxine  125 mcg Oral QAC breakfast  . lidocaine  15 mL Mouth/Throat Once  . [MAR Hold] magnesium oxide  400 mg Oral Daily  . [  MAR Hold] ondansetron (ZOFRAN) IV  4 mg Intravenous TID WC & HS  . [MAR Hold] pantoprazole  40 mg Oral Daily  . [MAR Hold] senna  1 tablet Oral BID  . [MAR Hold] sodium chloride flush  3 mL Intravenous Q12H  . [MAR Hold] tiZANidine  4 mg Oral QHS  . [MAR Hold] vitamin B-12  1,000 mcg Oral Daily   Continuous Infusions: . sodium chloride 125 mL/hr at 01/12/16 0303  . lactated ringers 75 mL/hr at 01/12/16 1200     LOS: 1 day    Time spent: 35 minutes    Markcus Lazenby A, MD [name] Pager [phone]  If 7PM-7AM, please contact night-coverage [email or web address] Password [facility] 01/12/2016, 3:27 PM

## 2016-01-13 ENCOUNTER — Ambulatory Visit: Payer: Commercial Managed Care - PPO | Admitting: Gastroenterology

## 2016-01-13 DIAGNOSIS — K8050 Calculus of bile duct without cholangitis or cholecystitis without obstruction: Secondary | ICD-10-CM

## 2016-01-13 DIAGNOSIS — Z9889 Other specified postprocedural states: Secondary | ICD-10-CM

## 2016-01-13 DIAGNOSIS — R748 Abnormal levels of other serum enzymes: Secondary | ICD-10-CM

## 2016-01-13 DIAGNOSIS — R1030 Lower abdominal pain, unspecified: Secondary | ICD-10-CM

## 2016-01-13 DIAGNOSIS — R17 Unspecified jaundice: Secondary | ICD-10-CM

## 2016-01-13 LAB — HEPATIC FUNCTION PANEL
ALT: 677 U/L — ABNORMAL HIGH (ref 14–54)
AST: 401 U/L — ABNORMAL HIGH (ref 15–41)
Albumin: 3.3 g/dL — ABNORMAL LOW (ref 3.5–5.0)
Alkaline Phosphatase: 230 U/L — ABNORMAL HIGH (ref 38–126)
Bilirubin, Direct: 4 mg/dL — ABNORMAL HIGH (ref 0.1–0.5)
Indirect Bilirubin: 2.3 mg/dL — ABNORMAL HIGH (ref 0.3–0.9)
Total Bilirubin: 6.3 mg/dL — ABNORMAL HIGH (ref 0.3–1.2)
Total Protein: 6 g/dL — ABNORMAL LOW (ref 6.5–8.1)

## 2016-01-13 LAB — MITOCHONDRIAL ANTIBODIES: Mitochondrial M2 Ab, IgG: <=20 Units (ref <=20.0)

## 2016-01-13 LAB — ANTI-SMOOTH MUSCLE ANTIBODY, IGG: Smooth Muscle Ab: <20 U (ref <20)

## 2016-01-13 LAB — LIPASE, BLOOD: Lipase: 25 U/L (ref 11–51)

## 2016-01-13 MED ORDER — HYDROMORPHONE HCL 1 MG/ML IJ SOLN: 0.5000 mg | INTRAMUSCULAR | Status: DC | PRN

## 2016-01-13 NOTE — Addendum Note (Signed)
Addendum  created 01/13/16 1146 by Laverle Hobby, CRNA   Sign clinical note

## 2016-01-13 NOTE — Progress Notes (Signed)
PROGRESS NOTE  [name] [name]  [id number] DOB: [birth year] DOA: 01/11/2016 PCP: [name], MD Outpatient Specialists:  Subjective: Feels much better this morning denies any complaints. Total bilirubin worsened to 6.3 this morning.  Brief Narrative:  45 year old female with past medical history of cholecystectomy came in with RUQ abdominal pain, transaminitis and hyperbilirubinemia, no fever or chills or leukocytosis. But complains about typical biliary colic type of pain.  Assessment & Plan:   Principal Problem:   Lower abdominal pain Active Problems:   Hypertension   Elevated LFTs   High bilirubin   Gall stones, common bile duct   Post-operative state   Choledocholithiasis -Patient presented with RUQ abdominal pain with an elevated LFTs. -Initially CT without acute findings, MRCP was done showed 2 CBD stones. -Dr. Oneida Alar did ERCP earlier today 01/12/16 with extraction of 2 stones and sphincterotomy. -Follow LFTs in a.m. -Patient was NPO since admission, was on IV fluids at the rate of 125 mL/hour. -For pain control she required IV hydromorphone and morphine to control the pain. -Total bilirubin worsened 6.3 today, per GI advance diet to full liquids and keep nothing by mouth past midnight. -If total bilirubin continued to worsen or not improved, repeat ERCP in a.m. with stent placement.  Hyperbilirubinemia -Total bilirubin is elevated at 6.3 after the ERCP, check LFTs in a.m.  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    DVT prophylaxis: SCDs and ambulation, hold any anticoagulation for stent placement in a.m. Code Status: Full Code Family Communication:  Disposition Plan:  Diet: Diet full liquid Room service appropriate? Yes; Fluid consistency: Thin Diet NPO time specified Except for: Sips with Meds  Consultants:    GI  Procedures:   ERCP  Antimicrobials:      Objective: Vitals:   01/12/16 1618 01/12/16 1638 01/12/16 2155 01/13/16 0515  BP:  (!) 155/90 (!) 174/84 119/80  Pulse: 66 78 79 83  Resp: 20 20 20 18   Temp:  97.8 F (36.6 C) 98.4 F (36.9 C) 98.5 F (36.9 C)  TempSrc:   Oral Oral  SpO2: 97%  100% 100%  Weight:      Height:        Intake/Output Summary (Last 24 hours) at 01/13/16 1332 Last data filed at 01/13/16 0900  Gross per 24 hour  Intake          2310.83 ml  Output             2150 ml  Net           160.83 ml   Filed Weights   01/11/16 1258 01/11/16 1856 01/12/16 1130  Weight: 96.2 kg (212 lb) 95.7 kg (211 lb) 95.7 kg (211 lb)    Examination: General exam: Appears calm and comfortable  Respiratory system: Clear to auscultation. Respiratory effort normal. Cardiovascular system: S1 & S2 heard, RRR. No JVD, murmurs, rubs, gallops or clicks. No pedal edema. Gastrointestinal system: Abdomen is nondistended, soft and nontender. No organomegaly or masses felt. Normal bowel sounds heard. Central nervous system: Alert and oriented. No focal neurological deficits. Extremities: Symmetric 5 x 5 power. Skin: No rashes, lesions or ulcers Psychiatry: Judgement and insight appear normal. Mood & affect appropriate.   Data Reviewed: I have personally reviewed following labs and imaging studies  CBC:  Recent Labs Lab 01/10/16 1225 01/11/16 1305 01/12/16 0544  WBC 9.5 5.3 4.5  NEUTROABS  --  4.2  --   HGB 14.3 14.1 11.7*  HCT 41.1 40.0 33.4*  MCV 91.7 91.5 93.0  PLT 312 270 [id number]   Basic Metabolic Panel:  Recent Labs Lab 01/10/16 1225 01/11/16 1305 01/11/16 2137  NA 137 136 137  K 4.0 3.3* 3.9  CL 104 105 110  CO2 21* 19* 24  GLUCOSE 137* 116* 89  BUN 12 9 9   CREATININE 0.78 0.67 0.66  CALCIUM 9.7 9.3 8.2*   GFR: Estimated Creatinine Clearance: 102.5 mL/min (by C-G formula based on SCr of 0.8 mg/dL). Liver Function Tests:  Recent Labs Lab  01/10/16 1225 01/11/16 1305 01/11/16 2137 01/12/16 1500 01/13/16 0523  AST 230* 368* 390* 433* 401*  ALT 403* 565* 580* 656* 677*  ALKPHOS 244* 248* 210* 229* 230*  BILITOT 3.9* 6.3* 5.3* 4.1* 6.3*  PROT 7.7 7.9 6.4* 5.9* 6.0*  ALBUMIN 4.4 4.3 3.5 3.4* 3.3*    Recent Labs Lab 01/10/16 1225 01/11/16 1305 01/12/16 1500 01/13/16 0523  LIPASE 23 30 31 25    No results for input(s): AMMONIA in the last 168 hours. Coagulation Profile:  Recent Labs Lab 01/11/16 0936  INR 1.0   Cardiac Enzymes: No results for input(s): CKTOTAL, CKMB, CKMBINDEX, TROPONINI in the last 168 hours. BNP (last 3 results) No results for input(s): PROBNP in the last 8760 hours. HbA1C: No results for input(s): HGBA1C in the last 72 hours. CBG: No results for input(s): GLUCAP in the last 168 hours. Lipid Profile: No results for input(s): CHOL, HDL, LDLCALC, TRIG, CHOLHDL, LDLDIRECT in the last 72 hours. Thyroid Function Tests:  Recent Labs  01/11/16 1302  TSH 0.548   Anemia Panel: No results for input(s): VITAMINB12, FOLATE, FERRITIN, TIBC, IRON, RETICCTPCT in the last 72 hours. Urine analysis:    Component Value Date/Time   COLORURINE AMBER (A) 01/11/2016 1335   APPEARANCEUR CLEAR 01/11/2016 1335   LABSPEC >1.030 (H) 01/11/2016 1335   PHURINE 5.5 01/11/2016 1335   GLUCOSEU 100 (A) 01/11/2016 1335   HGBUR MODERATE (A) 01/11/2016 1335   BILIRUBINUR LARGE (A) 01/11/2016 1335   KETONESUR >80 (A) 01/11/2016 1335   PROTEINUR 30 (A) 01/11/2016 1335   UROBILINOGEN 0.2 09/27/2012 1249   NITRITE NEGATIVE 01/11/2016 1335   LEUKOCYTESUR NEGATIVE 01/11/2016 1335   Sepsis Labs: @LABRCNTIP (procalcitonin:4,lacticidven:4)  )No results found for this or any previous visit (from the past 240 hour(s)).   Invalid input(s): PROCALCITONIN, LACTICACIDVEN   Radiology Studies: Mr 3d Recon At Scanner  Result Date: 01/12/2016 CLINICAL DATA:  Right upper quadrant abdominal pain for 2 days. History of prior  cholecystectomy. History of lymphoma. EXAM: MRI ABDOMEN WITHOUT AND WITH CONTRAST (INCLUDING MRCP) TECHNIQUE: Multiplanar multisequence MR imaging of the abdomen was performed both before and after the administration of intravenous contrast. Heavily T2-weighted images of the biliary and pancreatic ducts were obtained, and three-dimensional MRCP images were rendered by post processing. CONTRAST:  35mL MULTIHANCE GADOBENATE DIMEGLUMINE 529 MG/ML IV SOLN COMPARISON:  CT scan 01/10/2016 FINDINGS: Lower chest: The lung bases are clear. No pulmonary lesions, pleural or pericardial effusion. Hepatobiliary: No focal hepatic lesions or intrahepatic biliary dilatation. The portal and hepatic veins are patent. The gallbladder is surgically absent. Mild common bile duct dilatation with maximum measurement of 10 mm. There is mild extrinsic compression which appears to be caused by the crossing hepatic artery. There are 2 small common bile duct stones. More distal stone measures approximately 4.5 mm. Pancreas: No mass, inflammation or ductal dilatation. Spleen: Normal size.  No focal lesions. Adrenals/Urinary  Tract: The adrenal glands and kidneys are normal. Stomach/Bowel: The stomach, duodenum, visualized small bowel and visualized colon are unremarkable. No inflammatory changes, mass lesions or obstructive findings. Vascular/Lymphatic: The aorta is normal in caliber. The branch vessels are normal. No mesenteric or retroperitoneal mass or lymphadenopathy. Other: No ascites or abdominal wall hernia. Musculoskeletal: No significant bony findings. IMPRESSION: Status post cholecystectomy with mild biliary dilatation. There are 2 small common bile duct stones as above. The solid abdominal organs are unremarkable. No mass and no adenopathy. Electronically Signed   By: Marijo Sanes M.D.   On: 01/12/2016 08:20   Dg Ercp With Sphincterotomy  Result Date: 01/12/2016 CLINICAL DATA:  ERCP with sphincterotomy, biliary sweeping and  sphincterotomy. EXAM: ERCP TECHNIQUE: Multiple spot images obtained with the fluoroscopic device and submitted for interpretation post-procedure. FLUOROSCOPY TIME:  8 minutes, 45 seconds COMPARISON:  MRCP -01/11/2016 FINDINGS: Thirty spot spot fluoroscopic images of the right upper abdominal quadrant during ERCP are provided for review. Initial image demonstrates an ERCP probe overlying the right upper abdominal quadrant. Surgical clips overlie the expected location of the gallbladder fossa. Subsequent images demonstrate selective cannulation opacification of the common bile duct which appears markedly dilated. There is minimal opacification the central aspect of the intrahepatic biliary tree which appears mildly dilated. Subsequent images demonstrate insufflation of a balloon within the central aspect of the CBD with subsequent biliary sweeping and presumed sphincterotomy. There is no definitive opacification of the pancreatic duct or the residual cystic duct. IMPRESSION: ERCP with biliary sweeping and presumed sphincterotomy as above. These images were submitted for radiologic interpretation only. Please see the procedural report for the amount of contrast and the fluoroscopy time utilized. Electronically Signed   By: Sandi Mariscal M.D.   On: 01/12/2016 15:26   Dg Abd Portable 1v  Result Date: 01/12/2016 CLINICAL DATA:  Abdominal pain following ERCP EXAM: PORTABLE ABDOMEN - 1 VIEW COMPARISON:  None. FINDINGS: Decubitus views of the abdomen were obtained and reveal scattered large and small bowel gas. No definitive free air is seen. IMPRESSION: No evidence of free air. Electronically Signed   By: Inez Catalina M.D.   On: 01/12/2016 16:12   Mr Abd W/wo Cm/mrcp  Result Date: 01/12/2016 CLINICAL DATA:  Right upper quadrant abdominal pain for 2 days. History of prior cholecystectomy. History of lymphoma. EXAM: MRI ABDOMEN WITHOUT AND WITH CONTRAST (INCLUDING MRCP) TECHNIQUE: Multiplanar multisequence MR imaging of  the abdomen was performed both before and after the administration of intravenous contrast. Heavily T2-weighted images of the biliary and pancreatic ducts were obtained, and three-dimensional MRCP images were rendered by post processing. CONTRAST:  62mL MULTIHANCE GADOBENATE DIMEGLUMINE 529 MG/ML IV SOLN COMPARISON:  CT scan 01/10/2016 FINDINGS: Lower chest: The lung bases are clear. No pulmonary lesions, pleural or pericardial effusion. Hepatobiliary: No focal hepatic lesions or intrahepatic biliary dilatation. The portal and hepatic veins are patent. The gallbladder is surgically absent. Mild common bile duct dilatation with maximum measurement of 10 mm. There is mild extrinsic compression which appears to be caused by the crossing hepatic artery. There are 2 small common bile duct stones. More distal stone measures approximately 4.5 mm. Pancreas: No mass, inflammation or ductal dilatation. Spleen: Normal size.  No focal lesions. Adrenals/Urinary Tract: The adrenal glands and kidneys are normal. Stomach/Bowel: The stomach, duodenum, visualized small bowel and visualized colon are unremarkable. No inflammatory changes, mass lesions or obstructive findings. Vascular/Lymphatic: The aorta is normal in caliber. The branch vessels are normal. No mesenteric or retroperitoneal mass  or lymphadenopathy. Other: No ascites or abdominal wall hernia. Musculoskeletal: No significant bony findings. IMPRESSION: Status post cholecystectomy with mild biliary dilatation. There are 2 small common bile duct stones as above. The solid abdominal organs are unremarkable. No mass and no adenopathy. Electronically Signed   By: Marijo Sanes M.D.   On: 01/12/2016 08:20        Scheduled Meds: . B-complex with vitamin C  1 tablet Oral Daily  . cholecalciferol  1,000 Units Oral Daily  . cyclobenzaprine  10 mg Oral QHS  . levothyroxine  125 mcg Oral QAC breakfast  . magnesium oxide  400 mg Oral Daily  . ondansetron (ZOFRAN) IV  4 mg  Intravenous TID WC & HS  . pantoprazole  40 mg Oral Daily  . senna  1 tablet Oral BID  . sodium chloride flush  3 mL Intravenous Q12H  . tiZANidine  4 mg Oral QHS  . vitamin B-12  1,000 mcg Oral Daily   Continuous Infusions: . sodium chloride 125 mL/hr at 01/12/16 0303     LOS: 2 days    Time spent: 35 minutes    Geovani Tootle A, MD [name] Pager [phone]  If 7PM-7AM, please contact night-coverage [email or web address] Password [email or web address] 01/13/2016, 1:32 PM

## 2016-01-13 NOTE — Anesthesia Postprocedure Evaluation (Signed)
Anesthesia Post Note  Patient: [name] [name]  Procedure(s) Performed: Procedure(s) (LRB): ENDOSCOPIC RETROGRADE CHOLANGIOPANCREATOGRAPHY (ERCP) (N/A) SPHINCTEROTOMY (N/A) REMOVAL OF STONES (N/A)  Patient location during evaluation: Nursing Unit Anesthesia Type: MAC Level of consciousness: awake and alert and oriented Pain management: satisfactory to patient Vital Signs Assessment: post-procedure vital signs reviewed and stable Respiratory status: spontaneous breathing Cardiovascular status: stable Anesthetic complications: no Comments: Pleased with Anesthesia care.  Scheduled to return for ERCP tomorrow.  No issues or concerns voiced related to Anesthesia    Last Vitals:  Vitals:   01/12/16 2155 01/13/16 0515  BP: (!) 174/84 119/80  Pulse: 79 83  Resp: 20 18  Temp: 36.9 C 36.9 C    Last Pain:  Vitals:   01/13/16 0817  TempSrc:   PainSc: 0-No pain                 Jahnay Lantier D

## 2016-01-13 NOTE — Progress Notes (Signed)
Subjective: Patient feels significantly better. Abdominal pain and nausea resolved. Is wanting to eat. No bowel movement in a few days, has been NPO for about 4 days. No other upper or lower GI symptoms.  Objective: Vital signs in last 24 hours: Temp:  [97.8 F (36.6 C)-98.5 F (36.9 C)] 98.5 F (36.9 C) (08/03 0515) Pulse Rate:  [66-89] 83 (08/03 0515) Resp:  [12-26] 18 (08/03 0515) BP: (119-178)/(80-111) 119/80 (08/03 0515) SpO2:  [97 %-100 %] 100 % (08/03 0515) Weight:  [211 lb (95.7 kg)] 211 lb (95.7 kg) (08/02 1130) Last BM Date: 01/11/16 General:   Alert and oriented, pleasant Head:  Normocephalic and atraumatic. Eyes:  No icterus, sclera clear. Conjuctiva pink.  Heart:  S1, S2 present, no murmurs noted.  Lungs: Clear to auscultation bilaterally, without wheezing, rales, or rhonchi.  Abdomen:  Bowel sounds present, soft, non-tender, non-distended. No HSM or hernias noted. No rebound or guarding. No masses appreciated  Extremities:  Without clubbing or edema. Neurologic:  Alert and  oriented x4;  grossly normal neurologically. Skin:  Warm and dry, intact.  Psych:  Alert and cooperative. Normal mood and affect.  Intake/Output from previous day: 08/02 0701 - 08/03 0700 In: 2510.8 [P.O.:240; I.V.:2270.8] Out: 2000 [Urine:2000] Intake/Output this shift: No intake/output data recorded.  Lab Results:  Recent Labs  01/10/16 1225 01/11/16 1305 01/12/16 0544  WBC 9.5 5.3 4.5  HGB 14.3 14.1 11.7*  HCT 41.1 40.0 33.4*  PLT 312 270 190   BMET  Recent Labs  01/10/16 1225 01/11/16 1305 01/11/16 2137  NA 137 136 137  K 4.0 3.3* 3.9  CL 104 105 110  CO2 21* 19* 24  GLUCOSE 137* 116* 89  BUN 12 9 9   CREATININE 0.78 0.67 0.66  CALCIUM 9.7 9.3 8.2*   LFT  Recent Labs  01/11/16 1305 01/11/16 2137 01/12/16 1500 01/13/16 0523  PROT 7.9 6.4* 5.9* 6.0*  ALBUMIN 4.3 3.5 3.4* 3.3*  AST 368* 390* 433* 401*  ALT 565* 580* 656* 677*  ALKPHOS 248* 210* 229* 230*   BILITOT 6.3* 5.3* 4.1* 6.3*  BILIDIR 3.6*  --  2.4* 4.0*  IBILI  --   --  1.7* 2.3*   PT/INR  Recent Labs  01/11/16 0936  LABPROT 10.7  INR 1.0   Hepatitis Panel  Recent Labs  01/11/16 1107  HEPBSAG NEGATIVE  HCVAB NEGATIVE     Studies/Results: Mr 3d Recon At Scanner  Result Date: 01/12/2016 CLINICAL DATA:  Right upper quadrant abdominal pain for 2 days. History of prior cholecystectomy. History of lymphoma. EXAM: MRI ABDOMEN WITHOUT AND WITH CONTRAST (INCLUDING MRCP) TECHNIQUE: Multiplanar multisequence MR imaging of the abdomen was performed both before and after the administration of intravenous contrast. Heavily T2-weighted images of the biliary and pancreatic ducts were obtained, and three-dimensional MRCP images were rendered by post processing. CONTRAST:  81m MULTIHANCE GADOBENATE DIMEGLUMINE 529 MG/ML IV SOLN COMPARISON:  CT scan 01/10/2016 FINDINGS: Lower chest: The lung bases are clear. No pulmonary lesions, pleural or pericardial effusion. Hepatobiliary: No focal hepatic lesions or intrahepatic biliary dilatation. The portal and hepatic veins are patent. The gallbladder is surgically absent. Mild common bile duct dilatation with maximum measurement of 10 mm. There is mild extrinsic compression which appears to be caused by the crossing hepatic artery. There are 2 small common bile duct stones. More distal stone measures approximately 4.5 mm. Pancreas: No mass, inflammation or ductal dilatation. Spleen: Normal size.  No focal lesions. Adrenals/Urinary Tract: The adrenal glands  and kidneys are normal. Stomach/Bowel: The stomach, duodenum, visualized small bowel and visualized colon are unremarkable. No inflammatory changes, mass lesions or obstructive findings. Vascular/Lymphatic: The aorta is normal in caliber. The branch vessels are normal. No mesenteric or retroperitoneal mass or lymphadenopathy. Other: No ascites or abdominal wall hernia. Musculoskeletal: No significant bony  findings. IMPRESSION: Status post cholecystectomy with mild biliary dilatation. There are 2 small common bile duct stones as above. The solid abdominal organs are unremarkable. No mass and no adenopathy. Electronically Signed   By: Marijo Sanes M.D.   On: 01/12/2016 08:20   Dg Ercp With Sphincterotomy  Result Date: 01/12/2016 CLINICAL DATA:  ERCP with sphincterotomy, biliary sweeping and sphincterotomy. EXAM: ERCP TECHNIQUE: Multiple spot images obtained with the fluoroscopic device and submitted for interpretation post-procedure. FLUOROSCOPY TIME:  8 minutes, 45 seconds COMPARISON:  MRCP -01/11/2016 FINDINGS: Thirty spot spot fluoroscopic images of the right upper abdominal quadrant during ERCP are provided for review. Initial image demonstrates an ERCP probe overlying the right upper abdominal quadrant. Surgical clips overlie the expected location of the gallbladder fossa. Subsequent images demonstrate selective cannulation opacification of the common bile duct which appears markedly dilated. There is minimal opacification the central aspect of the intrahepatic biliary tree which appears mildly dilated. Subsequent images demonstrate insufflation of a balloon within the central aspect of the CBD with subsequent biliary sweeping and presumed sphincterotomy. There is no definitive opacification of the pancreatic duct or the residual cystic duct. IMPRESSION: ERCP with biliary sweeping and presumed sphincterotomy as above. These images were submitted for radiologic interpretation only. Please see the procedural report for the amount of contrast and the fluoroscopy time utilized. Electronically Signed   By: Sandi Mariscal M.D.   On: 01/12/2016 15:26   Dg Abd Portable 1v  Result Date: 01/12/2016 CLINICAL DATA:  Abdominal pain following ERCP EXAM: PORTABLE ABDOMEN - 1 VIEW COMPARISON:  None. FINDINGS: Decubitus views of the abdomen were obtained and reveal scattered large and small bowel gas. No definitive free air is  seen. IMPRESSION: No evidence of free air. Electronically Signed   By: Inez Catalina M.D.   On: 01/12/2016 16:12   Mr Abd W/wo Cm/mrcp  Result Date: 01/12/2016 CLINICAL DATA:  Right upper quadrant abdominal pain for 2 days. History of prior cholecystectomy. History of lymphoma. EXAM: MRI ABDOMEN WITHOUT AND WITH CONTRAST (INCLUDING MRCP) TECHNIQUE: Multiplanar multisequence MR imaging of the abdomen was performed both before and after the administration of intravenous contrast. Heavily T2-weighted images of the biliary and pancreatic ducts were obtained, and three-dimensional MRCP images were rendered by post processing. CONTRAST:  13m MULTIHANCE GADOBENATE DIMEGLUMINE 529 MG/ML IV SOLN COMPARISON:  CT scan 01/10/2016 FINDINGS: Lower chest: The lung bases are clear. No pulmonary lesions, pleural or pericardial effusion. Hepatobiliary: No focal hepatic lesions or intrahepatic biliary dilatation. The portal and hepatic veins are patent. The gallbladder is surgically absent. Mild common bile duct dilatation with maximum measurement of 10 mm. There is mild extrinsic compression which appears to be caused by the crossing hepatic artery. There are 2 small common bile duct stones. More distal stone measures approximately 4.5 mm. Pancreas: No mass, inflammation or ductal dilatation. Spleen: Normal size.  No focal lesions. Adrenals/Urinary Tract: The adrenal glands and kidneys are normal. Stomach/Bowel: The stomach, duodenum, visualized small bowel and visualized colon are unremarkable. No inflammatory changes, mass lesions or obstructive findings. Vascular/Lymphatic: The aorta is normal in caliber. The branch vessels are normal. No mesenteric or retroperitoneal mass or lymphadenopathy. Other: No  ascites or abdominal wall hernia. Musculoskeletal: No significant bony findings. IMPRESSION: Status post cholecystectomy with mild biliary dilatation. There are 2 small common bile duct stones as above. The solid abdominal organs  are unremarkable. No mass and no adenopathy. Electronically Signed   By: Marijo Sanes M.D.   On: 01/12/2016 08:20    Assessment: Patient admitted 01/11/16 with abdominal pain and elevated LFTs. S/P cholecystectomy with initial AST/ALT 368/565, Alk Phos 248, total bili 6.3, direct bili 3.6. ANA positive, ANA titer elevated, other immune labs pending. Hep B/C negative. Hep A Ab positive. MRCP showed two CBD stones. ERCP with sphincterotomy and stone extraction performed yesterday 01/13/16 which found major papilla normal, two filling defects consistent with stone on cholangiogram, obstructive stone causing dilation of entire mail bile duct an common hepatic duct. CBD swept and noted two stones extracted.  Post-op significant abdominal pain. Lipase normal, HFP baseline/slightly improved, abdominal film no free air. Pain improved over the course of the next few hours.  CBD stone: s/p ERCP with stone extraction. Labs this morning: Alk phos stable, lipase normal/improved, AST/ALT stable/slightly improved compared to yesterday, direct bili increased to 4.0 from 2.4 yesterday, indirect bili increased to 2.3 from 1.7, total bili increased to 6.3 from 4.1 yesterday.  Abdominal pain: improved/resolved as of this morning.  Hep A/Autoimmune: positive ANA titer and Hep A antibody, possibly previous infection. Will check Hep A IgM to rule out active Hep A infection.  Discussed with Dr. Oneida Alar, if no improvement in labs possible ERCP tomorrow with stenting.   Plan: 1. Continue pain and nausea management if needed 2. Hep A IgM - add on to blood  3. Recheck HFP in the morning 4. Supportive measures 5. Full liquid diet, NPO after midnight   Walden Field, AGNP-C Adult & Gerontological Nurse Practitioner [facility]    LOS: 2 days    01/13/2016, 8:26 AM

## 2016-01-13 NOTE — Care Management Note (Signed)
Case Management Note  Patient Details  Name: [name] [name] MRN: [id number] Date of Birth: 1970-07-23  Subjective/Objective:                  Admitted with abdominal pain. Pt is alert and oriented, from home and ind with ADL's. Pt has [place] services or DME PTA. Pt has insurance, PCP and transportation. Pt will return home with self care.   Action/Plan: No CM needs anticipated.   Expected Discharge Date:     01/15/2016             Expected Discharge Plan:  Home/Self Care  In-House Referral:  NA  Discharge planning Services  CM Consult  Post Acute Care Choice:  NA Choice offered to:  NA  DME Arranged:    DME Agency:     HH Arranged:    HH Agency:     Status of Service:  Completed, signed off  If discussed at [employer] of Stay Meetings, dates discussed:    Additional Comments:  Sherald Barge, RN 01/13/2016, 3:34 PM

## 2016-01-14 LAB — BASIC METABOLIC PANEL
Anion gap: 3 — ABNORMAL LOW (ref 5–15)
BUN: 6 mg/dL (ref 6–20)
CO2: 28 mmol/L (ref 22–32)
Calcium: 8.5 mg/dL — ABNORMAL LOW (ref 8.9–10.3)
Chloride: 104 mmol/L (ref 101–111)
Creatinine, Ser: 0.77 mg/dL (ref 0.44–1.00)
GFR calc Af Amer: >60 mL/min (ref >60)
GFR calc non Af Amer: >60 mL/min (ref >60)
Glucose, Bld: 104 mg/dL — ABNORMAL HIGH (ref 65–99)
Potassium: 3.3 mmol/L — ABNORMAL LOW (ref 3.5–5.1)
Sodium: 135 mmol/L (ref 135–145)

## 2016-01-14 LAB — HEPATIC FUNCTION PANEL
ALT: 694 U/L — ABNORMAL HIGH (ref 14–54)
AST: 339 U/L — ABNORMAL HIGH (ref 15–41)
Albumin: 3.6 g/dL (ref 3.5–5.0)
Alkaline Phosphatase: 254 U/L — ABNORMAL HIGH (ref 38–126)
Bilirubin, Direct: 1.2 mg/dL — ABNORMAL HIGH (ref 0.1–0.5)
Indirect Bilirubin: 1.4 mg/dL — ABNORMAL HIGH (ref 0.3–0.9)
Total Bilirubin: 2.6 mg/dL — ABNORMAL HIGH (ref 0.3–1.2)
Total Protein: 6.5 g/dL (ref 6.5–8.1)

## 2016-01-14 LAB — HEPATITIS A ANTIBODY, IGM: Hep A IgM: NEGATIVE

## 2016-01-14 MED ORDER — POTASSIUM CHLORIDE CRYS ER 20 MEQ PO TBCR
40.0000 meq | EXTENDED_RELEASE_TABLET | Freq: Once | ORAL | Status: AC
  Administered 2016-01-14: 40 meq via ORAL
  Filled 2016-01-14: qty 2

## 2016-01-14 NOTE — Progress Notes (Signed)
Pt IV removed, tolerated well.  Reviewed discharge instructions with pt and family.  Answered questions at this time.

## 2016-01-14 NOTE — Progress Notes (Signed)
  Assessment/Plan: ADMITTED WITH CBD STONE/OBSTRUCTION DEVELOPED SEVERE EPIGASTRIC PAIN AFTER ERCP/Sx/STONE EXTRACTION. PAIN RESOLVED. LIVER ENZYMES IMPROVING. EXPLAINED TO PT POS ANA NOT CLINICALLY SIGNIFICANT UNLESS SHE HAS OTHER SIGNS/SYMPTOMS OF A RHEUMATOLOGIC DISORDER.  PLAN: 1. ADVANCE DIET 2. OK TO D/C HOME IF NO OTHER MEDICAL ISSUES 3. PT WILL CALL WITH QUESTIONS OR CONCERNS. 4. FOLLOW UP IN ONE MO W/ DR. FIELDS. RECHECK CMP 1 WEEK PRIOR TO NEXT VISIT COLONOSCOPY AT AGE 45. 5. DISCUSSED ANA WITH PT.   Subjective: Since I last evaluated the patient SHE IS PAIN FREE AND HAS NO NAUSEA.  Objective: Vital signs in last 24 hours: Vitals:   01/13/16 2229 01/14/16 0500  BP: 130/70 (!) 148/90  Pulse: 78 92  Resp: 20 20  Temp: 98.4 F (36.9 C) 98.4 F (36.9 C)     General appearance: alert, cooperative and no distress Resp: clear to auscultation bilaterally Cardio: regular rate and rhythm GI: soft, non-tender; bowel sounds normal; no masses,  no organomegaly  Lab Results: ALK PHOS 254 ALT 694 AST 339 T BILI 2.6  OUTPATIENT: ANA POS  Studies/Results: No results found.  Medications: I have reviewed the patient's current medications.   LOS: 5 days   Barney Drain 11/20/2013, 2:23 PM

## 2016-01-14 NOTE — Discharge Summary (Signed)
Physician Discharge Summary  [name] [name] [id number] DOB: Feb 25, 1971 DOA: 01/11/2016  PCP: [name], MD  Admit date: 01/11/2016 Discharge date: 01/14/2016  Admitted From: Home Disposition: Home  Recommendations for Outpatient Follow-up:  1. Patient follow-up with gastroenterology on the 25th 2. Please obtain CMP/CBC in one week  Home Health: No Equipment/Devices: N/A  Discharge Condition: Stable CODE STATUS: Full code Diet recommendation: Regular diet  Brief/Interim Summary: [name] [name]  is a 45 y.o. female, with past medical history relevant for status post cholecystectomy in March 2015 as well as history of lymphoma and hypothyroidism who presents to the ED for the second day in the room with abdominal pain associated with nausea. Over the last few days abdominal pain as mostly been epigastric area associated with nausea patient has had to self induce emesis in order to aleviate her symptoms. No fevers, no chills and no rigors. No leg pains less swelling, no shortness of breath no chest pains. No sick contacts at home no recent travel. Patient denies alcohol use. CT abdomen and pelvis done on 01/10/2016 and repeated on 01/11/2016 without acute findings in the ED today. Her urine is dark orange color, her stool is gray to green. Appetite has been poor, oral intake diminished due to nausea over the last few days. ED provider contacted the GI physician Dr. Barney Drain who recommended admission   Discharge Diagnoses:  Principal Problem:   Lower abdominal pain Active Problems:   Hypertension   Elevated LFTs   High bilirubin   Gall stones, common bile duct   Post-operative state   Elevated liver enzymes   Choledocholithiasis -Patient presented with RUQ abdominal pain with an elevated LFTs. -Initially CT without acute findings, MRCP was done showed 2 CBD stones. -Dr. Oneida Alar did ERCP earlier today 01/12/16 with extraction of 2 stones and sphincterotomy. -Patient was NPO  since admission, was on IV fluids at the rate of 125 mL/hour. -For pain control she required IV hydromorphone and morphine to control the pain. -No evidence of acute cholangitis, patient has RUQ abdominal pain and jaundice but no fever or chills. -1 day after the ERCP the total bilirubin increased to 6.3, so patient kept overnight. -On the day of discharge total bilirubin decreased to 2.6, patient does not have any symptoms. -Tolerated diet well without any nausea/vomiting or abdominal pain, discharged home to follow-up with Dr. Oneida Alar as outpatient.  Hyperbilirubinemia -Total bilirubin is elevated at 6.3 after the ERCP, total bilirubin is 2.6 today.  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  Hypokalemia -Potassium was slightly low at 3.3 on the day of discharge, repleted with oral supplements.   Discharge Instructions  Discharge Instructions    Increase activity slowly    Complete by:  As directed       Medication List    TAKE these medications   ADVIL PM PO Take 2 tablets by mouth at bedtime as needed (pain/sleep).   b complex-C-E-zinc tablet Take 1 tablet by mouth daily. Reported on 09/03/2015   cholecalciferol 1000 units tablet Commonly known as:  VITAMIN D Take 1,000 Units by mouth daily. Reported on 09/03/2015   cyclobenzaprine 10 MG tablet Commonly known as:  FLEXERIL Take 10 mg by mouth at bedtime.   HAIR/SKIN/NAILS/BIOTIN PO Take 1 capsule by mouth daily. Reported on 09/03/2015   ANTIOXIDANT FORMULA Caps Take 1 capsule by mouth daily. Reported on  09/03/2015   hydrochlorothiazide 12.5 MG tablet Commonly known as:  HYDRODIURIL Take 1 tablet (12.5 mg total) by mouth daily. What changed:  when to take this  reasons to take this   HYDROcodone-acetaminophen 5-325 MG tablet Commonly known as:  NORCO/VICODIN Take 2  tablets by mouth every 4 (four) hours as needed.   JUNEL FE 1/20 1-20 MG-MCG tablet Generic drug:  norethindrone-ethinyl estradiol Take 1 tablet by mouth daily. Notes to patient:  Resume as directed   KRILL OIL PO Take 1 capsule by mouth daily.   levothyroxine 125 MCG tablet Commonly known as:  SYNTHROID, LEVOTHROID Take 125 mcg by mouth daily before breakfast.   MAGNESIUM PO Take 450 mg by mouth daily.   piroxicam 20 MG capsule Commonly known as:  FELDENE Take 1 capsule by mouth daily as needed (plantar facitis.).   promethazine 12.5 MG tablet Commonly known as:  PHENERGAN 1-2 PO 30 MINS PRIOR TO MEALS UP TO THREE TIMES A DAY. DOSES SHOULD BE 4 HRS APART. Notes to patient:  Resume as directed   temazepam 15 MG capsule Commonly known as:  RESTORIL Take 15 mg by mouth at bedtime as needed for sleep.   tiZANidine 4 MG tablet Commonly known as:  ZANAFLEX Take 4 mg by mouth at bedtime.   VITAMIN B-12 PO Take 1 capsule by mouth daily.       Allergies  Allergen Reactions  . Other Other (See Comments)    Patient can not have straight oxygen due to reaction with chemo medications.   . Sulfur Anaphylaxis    Consultations:  Gastroenterology.  Procedures/Studies: Ct Abdomen W Contrast  Result Date: 12/30/2015 CLINICAL DATA:  Epigastric pain radiating to the back for 4 days. EXAM: CT ABDOMEN WITH CONTRAST TECHNIQUE: Multidetector CT imaging of the abdomen was performed using the standard protocol following bolus administration of intravenous contrast. CONTRAST:  182mL ISOVUE-300 IOPAMIDOL (ISOVUE-300) INJECTION 61% COMPARISON:  August 26, 2015 CT scan FINDINGS: Normal lung bases. No free air or free fluid. The liver and portal vein are within normal limits. The patient is status post cholecystectomy resulting in intra and extrahepatic biliary duct dilatation. No filling defects seen in the biliary tree. The spleen, adrenal glands, and pancreas are within normal limits. The  kidneys are normal as well. No adenopathy. The abdominal aorta is within normal limits. There is a fat containing umbilical hernia. The stomach and small bowel are within normal limits. The colon and appendix are normal. The pelvis demonstrates mildly prominent pelvic vasculature adjacent to the uterus particularly on the left. This is a stable finding and of doubtful acute significance. The uterus and ovaries are unremarkable. No adenopathy or mass in the pelvis. The bladder is decompressed but normal. Delayed images through the upper abdomen demonstrate no filling defects in the renal collecting systems. The bones are unremarkable. IMPRESSION: 1. No cause for the patient's pain identified. Electronically Signed   By: Dorise Bullion III M.D   On: 12/30/2015 20:11   Ct Abdomen Pelvis W Contrast  Result Date: 01/10/2016 CLINICAL DATA:  Severe epigastric pain. Elevated bilirubin. History lymphoma EXAM: CT ABDOMEN AND PELVIS WITH CONTRAST TECHNIQUE: Multidetector CT imaging of the abdomen and pelvis was performed using the standard protocol following bolus administration of intravenous contrast. CONTRAST:  112mL ISOVUE-300 IOPAMIDOL (ISOVUE-300) INJECTION 61% COMPARISON:  CT abdomen pelvis 12/30/2015 FINDINGS: Lower chest:  Lung bases clear without infiltrate or effusion. Hepatobiliary: Cholecystectomy. Normal-appearing liver. No biliary dilatation. Mild biliary dilatation seen previously has resolved. Pancreas:  Normal pancreas without evidence of pancreatitis. No pancreatic calcifications or masses. Spleen: Negative Adrenals/Urinary Tract: Symmetric excretion of contrast by both kidneys. No renal obstruction or mass. Normal urinary bladder. Stomach/Bowel: Negative for bowel obstruction. No bowel edema. Stomach and duodenum normal without evidence of edema. Negative for diverticulitis. Normal appendix. Vascular/Lymphatic: Normal aorta and IVC. No lymphadenopathy or mass lesion. Reproductive: Normal uterus.  No  adnexal mass. Other: No free fluid.  No free air. Musculoskeletal: No acute skeletal abnormality. IMPRESSION: No cause for acute abdominal pain. Postop cholecystectomy. Bile ducts are nondilated and appear decompressed compared with the recent CT when there was mild prominence of the bile ducts Normal appendix. Electronically Signed   By: Franchot Gallo M.D.   On: 01/10/2016 16:47   Mr 3d Recon At Scanner  Result Date: 01/12/2016 CLINICAL DATA:  Right upper quadrant abdominal pain for 2 days. History of prior cholecystectomy. History of lymphoma. EXAM: MRI ABDOMEN WITHOUT AND WITH CONTRAST (INCLUDING MRCP) TECHNIQUE: Multiplanar multisequence MR imaging of the abdomen was performed both before and after the administration of intravenous contrast. Heavily T2-weighted images of the biliary and pancreatic ducts were obtained, and three-dimensional MRCP images were rendered by post processing. CONTRAST:  48mL MULTIHANCE GADOBENATE DIMEGLUMINE 529 MG/ML IV SOLN COMPARISON:  CT scan 01/10/2016 FINDINGS: Lower chest: The lung bases are clear. No pulmonary lesions, pleural or pericardial effusion. Hepatobiliary: No focal hepatic lesions or intrahepatic biliary dilatation. The portal and hepatic veins are patent. The gallbladder is surgically absent. Mild common bile duct dilatation with maximum measurement of 10 mm. There is mild extrinsic compression which appears to be caused by the crossing hepatic artery. There are 2 small common bile duct stones. More distal stone measures approximately 4.5 mm. Pancreas: No mass, inflammation or ductal dilatation. Spleen: Normal size.  No focal lesions. Adrenals/Urinary Tract: The adrenal glands and kidneys are normal. Stomach/Bowel: The stomach, duodenum, visualized small bowel and visualized colon are unremarkable. No inflammatory changes, mass lesions or obstructive findings. Vascular/Lymphatic: The aorta is normal in caliber. The branch vessels are normal. No mesenteric or  retroperitoneal mass or lymphadenopathy. Other: No ascites or abdominal wall hernia. Musculoskeletal: No significant bony findings. IMPRESSION: Status post cholecystectomy with mild biliary dilatation. There are 2 small common bile duct stones as above. The solid abdominal organs are unremarkable. No mass and no adenopathy. Electronically Signed   By: Marijo Sanes M.D.   On: 01/12/2016 08:20   Dg Ercp With Sphincterotomy  Result Date: 01/12/2016 CLINICAL DATA:  ERCP with sphincterotomy, biliary sweeping and sphincterotomy. EXAM: ERCP TECHNIQUE: Multiple spot images obtained with the fluoroscopic device and submitted for interpretation post-procedure. FLUOROSCOPY TIME:  8 minutes, 45 seconds COMPARISON:  MRCP -01/11/2016 FINDINGS: Thirty spot spot fluoroscopic images of the right upper abdominal quadrant during ERCP are provided for review. Initial image demonstrates an ERCP probe overlying the right upper abdominal quadrant. Surgical clips overlie the expected location of the gallbladder fossa. Subsequent images demonstrate selective cannulation opacification of the common bile duct which appears markedly dilated. There is minimal opacification the central aspect of the intrahepatic biliary tree which appears mildly dilated. Subsequent images demonstrate insufflation of a balloon within the central aspect of the CBD with subsequent biliary sweeping and presumed sphincterotomy. There is no definitive opacification of the pancreatic duct or the residual cystic duct. IMPRESSION: ERCP with biliary sweeping and presumed sphincterotomy as above. These images were submitted for radiologic interpretation only. Please see the procedural report for the amount of contrast and the fluoroscopy time  utilized. Electronically Signed   By: Sandi Mariscal M.D.   On: 01/12/2016 15:26   Dg Abd Portable 1v  Result Date: 01/12/2016 CLINICAL DATA:  Abdominal pain following ERCP EXAM: PORTABLE ABDOMEN - 1 VIEW COMPARISON:  None.  FINDINGS: Decubitus views of the abdomen were obtained and reveal scattered large and small bowel gas. No definitive free air is seen. IMPRESSION: No evidence of free air. Electronically Signed   By: Inez Catalina M.D.   On: 01/12/2016 16:12   Mr Abd W/wo Cm/mrcp  Result Date: 01/12/2016 CLINICAL DATA:  Right upper quadrant abdominal pain for 2 days. History of prior cholecystectomy. History of lymphoma. EXAM: MRI ABDOMEN WITHOUT AND WITH CONTRAST (INCLUDING MRCP) TECHNIQUE: Multiplanar multisequence MR imaging of the abdomen was performed both before and after the administration of intravenous contrast. Heavily T2-weighted images of the biliary and pancreatic ducts were obtained, and three-dimensional MRCP images were rendered by post processing. CONTRAST:  67mL MULTIHANCE GADOBENATE DIMEGLUMINE 529 MG/ML IV SOLN COMPARISON:  CT scan 01/10/2016 FINDINGS: Lower chest: The lung bases are clear. No pulmonary lesions, pleural or pericardial effusion. Hepatobiliary: No focal hepatic lesions or intrahepatic biliary dilatation. The portal and hepatic veins are patent. The gallbladder is surgically absent. Mild common bile duct dilatation with maximum measurement of 10 mm. There is mild extrinsic compression which appears to be caused by the crossing hepatic artery. There are 2 small common bile duct stones. More distal stone measures approximately 4.5 mm. Pancreas: No mass, inflammation or ductal dilatation. Spleen: Normal size.  No focal lesions. Adrenals/Urinary Tract: The adrenal glands and kidneys are normal. Stomach/Bowel: The stomach, duodenum, visualized small bowel and visualized colon are unremarkable. No inflammatory changes, mass lesions or obstructive findings. Vascular/Lymphatic: The aorta is normal in caliber. The branch vessels are normal. No mesenteric or retroperitoneal mass or lymphadenopathy. Other: No ascites or abdominal wall hernia. Musculoskeletal: No significant bony findings. IMPRESSION: Status  post cholecystectomy with mild biliary dilatation. There are 2 small common bile duct stones as above. The solid abdominal organs are unremarkable. No mass and no adenopathy. Electronically Signed   By: Marijo Sanes M.D.   On: 01/12/2016 08:20    (Echo, Carotid, EGD, Colonoscopy, ERCP)    Subjective:   Discharge Exam: Vitals:   01/13/16 2229 01/14/16 0500  BP: 130/70 (!) 148/90  Pulse: 78 92  Resp: 20 20  Temp: 98.4 F (36.9 C) 98.4 F (36.9 C)   Vitals:   01/13/16 0515 01/13/16 1504 01/13/16 2229 01/14/16 0500  BP: 119/80 (!) 177/89 130/70 (!) 148/90  Pulse: 83 92 78 92  Resp: 18 20 20 20   Temp: 98.5 F (36.9 C) 98.5 F (36.9 C) 98.4 F (36.9 C) 98.4 F (36.9 C)  TempSrc: Oral Oral Oral Oral  SpO2: 100% 100% 100% 100%  Weight:      Height:        General: Pt is alert, awake, not in acute distress Cardiovascular: RRR, S1/S2 +, no rubs, no gallops Respiratory: CTA bilaterally, no wheezing, no rhonchi Abdominal: Soft, NT, ND, bowel sounds + Extremities: no edema, no cyanosis    The results of significant diagnostics from this hospitalization (including imaging, microbiology, ancillary and laboratory) are listed below for reference.     Microbiology: No results found for this or any previous visit (from the past 240 hour(s)).   Labs: BNP (last 3 results) No results for input(s): BNP in the last 8760 hours. Basic Metabolic Panel:  Recent Labs Lab 01/10/16 1225 01/11/16 1305 01/11/16  2137 01/14/16 0532  NA 137 136 137 135  K 4.0 3.3* 3.9 3.3*  CL 104 105 110 104  CO2 21* 19* 24 28  GLUCOSE 137* 116* 89 104*  BUN 12 9 9 6   CREATININE 0.78 0.67 0.66 0.77  CALCIUM 9.7 9.3 8.2* 8.5*   Liver Function Tests:  Recent Labs Lab 01/11/16 1305 01/11/16 2137 01/12/16 1500 01/13/16 0523 01/14/16 0532  AST 368* 390* 433* 401* 339*  ALT 565* 580* 656* 677* 694*  ALKPHOS 248* 210* 229* 230* 254*  BILITOT 6.3* 5.3* 4.1* 6.3* 2.6*  PROT 7.9 6.4* 5.9* 6.0*  6.5  ALBUMIN 4.3 3.5 3.4* 3.3* 3.6    Recent Labs Lab 01/10/16 1225 01/11/16 1305 01/12/16 1500 01/13/16 0523  LIPASE 23 30 31 25    No results for input(s): AMMONIA in the last 168 hours. CBC:  Recent Labs Lab 01/10/16 1225 01/11/16 1305 01/12/16 0544  WBC 9.5 5.3 4.5  NEUTROABS  --  4.2  --   HGB 14.3 14.1 11.7*  HCT 41.1 40.0 33.4*  MCV 91.7 91.5 93.0  PLT 312 270 190   Cardiac Enzymes: No results for input(s): CKTOTAL, CKMB, CKMBINDEX, TROPONINI in the last 168 hours. BNP: Invalid input(s): POCBNP CBG: No results for input(s): GLUCAP in the last 168 hours. D-Dimer No results for input(s): DDIMER in the last 72 hours. Hgb A1c No results for input(s): HGBA1C in the last 72 hours. Lipid Profile No results for input(s): CHOL, HDL, LDLCALC, TRIG, CHOLHDL, LDLDIRECT in the last 72 hours. Thyroid function studies  Recent Labs  01/11/16 1302  TSH 0.548   Anemia work up No results for input(s): VITAMINB12, FOLATE, FERRITIN, TIBC, IRON, RETICCTPCT in the last 72 hours. Urinalysis    Component Value Date/Time   COLORURINE AMBER (A) 01/11/2016 1335   APPEARANCEUR CLEAR 01/11/2016 1335   LABSPEC >1.030 (H) 01/11/2016 1335   PHURINE 5.5 01/11/2016 1335   GLUCOSEU 100 (A) 01/11/2016 1335   HGBUR MODERATE (A) 01/11/2016 1335   BILIRUBINUR LARGE (A) 01/11/2016 1335   KETONESUR >80 (A) 01/11/2016 1335   PROTEINUR 30 (A) 01/11/2016 1335   UROBILINOGEN 0.2 09/27/2012 1249   NITRITE NEGATIVE 01/11/2016 1335   LEUKOCYTESUR NEGATIVE 01/11/2016 1335   Sepsis Labs Invalid input(s): PROCALCITONIN,  WBC,  LACTICIDVEN Microbiology No results found for this or any previous visit (from the past 240 hour(s)).   Time coordinating discharge: Over 30 minutes  SIGNED:   Birdie Hopes, MD  [name] 01/14/2016, 12:50 PM Pager   If 7PM-7AM, please contact night-coverage [email or web address] Password [email or web address]

## 2016-01-18 ENCOUNTER — Encounter (HOSPITAL_COMMUNITY): Payer: Self-pay | Admitting: Gastroenterology

## 2016-01-19 ENCOUNTER — Telehealth (HOSPITAL_COMMUNITY): Payer: Self-pay | Admitting: *Deleted

## 2016-01-19 NOTE — Telephone Encounter (Signed)
Attempted to call pt with stress echo results and Left message to call back

## 2016-01-19 NOTE — Telephone Encounter (Signed)
Pt given stress echo results

## 2016-02-04 ENCOUNTER — Ambulatory Visit: Payer: Commercial Managed Care - PPO | Admitting: Gastroenterology

## 2016-02-17 ENCOUNTER — Ambulatory Visit (HOSPITAL_BASED_OUTPATIENT_CLINIC_OR_DEPARTMENT_OTHER): Payer: Commercial Managed Care - PPO | Attending: Internal Medicine | Admitting: Cardiovascular Disease

## 2016-02-17 DIAGNOSIS — R0683 Snoring: Secondary | ICD-10-CM | POA: Diagnosis not present

## 2016-02-17 DIAGNOSIS — Z79899 Other long term (current) drug therapy: Secondary | ICD-10-CM | POA: Insufficient documentation

## 2016-02-24 NOTE — Procedures (Signed)
Patient Name: [name] [name], [name] [name] Date: 02/17/2016 Gender: Female D.O.B: August 06, 1970 Age (years): 45 Referring Provider: Shaune Pascal Bensimhon Height (inches): 67 Interpreting Physician: Shelva Majestic MD, ABSM Weight (lbs): 211 RPSGT: Zadie Rhine BMI: 34 MRN: [id number] Neck Size: 14.50  CLINICAL INFORMATION Sleep Study Type: NPSG Indication for sleep study: Snoring Epworth Sleepiness Score: 8  SLEEP STUDY TECHNIQUE As per the AASM Manual for the Scoring of Sleep and Associated Events v2.3 (April 2016) with a hypopnea requiring 4% desaturations. The channels recorded and monitored were frontal, central and occipital EEG, electrooculogram (EOG), submentalis EMG (chin), nasal and oral airflow, thoracic and abdominal wall motion, anterior tibialis EMG, snore microphone, electrocardiogram, and pulse oximetry.  MEDICATIONS B Complex-C-E-Zn (B COMPLEX-C-E-ZINC) tablet cholecalciferol (VITAMIN D) 1000 UNITS tablet Cyanocobalamin (VITAMIN B-12 PO) cyclobenzaprine (FLEXERIL) 10 MG tablet hydrochlorothiazide (HYDRODIURIL) 12.5 MG tablet HYDROcodone-acetaminophen (NORCO/VICODIN) 5-325 MG tablet Ibuprofen-Diphenhydramine Cit (ADVIL PM PO) JUNEL FE 1/20 1-20 MG-MCG tablet KRILL OIL PO levothyroxine (SYNTHROID, LEVOTHROID) 125 MCG tablet MAGNESIUM PO Multiple Vitamins-Minerals (ANTIOXIDANT FORMULA) CAPS Multiple Vitamins-Minerals (HAIR/SKIN/NAILS/BIOTIN PO) piroxicam (FELDENE) 20 MG capsule promethazine (PHENERGAN) 12.5 MG tablet temazepam (RESTORIL) 15 MG capsule tiZANidine (ZANAFLEX) 4 MG tablet  Medications self-administered by patient during sleep study : Sleep medicine administered - TEMAZEPAN at 10:00:00 PM  SLEEP ARCHITECTURE The study was initiated at 10:23:51 PM and ended at 4:36:01 AM. Sleep onset time was 45.2 minutes and the sleep efficiency was 66.5%. The total sleep time was 247.5 minutes. Wake after sleep onset (WASO) was 79.5 minutes. Stage REM latency was  152.0 minutes. The patient spent 8.08% of the night in stage N1 sleep, 72.53% in stage N2 sleep, 0.81% in stage N3 and 18.59% in REM. Alpha intrusion was absent. Supine sleep was 6.26%.  RESPIRATORY PARAMETERS The overall apnea/hypopnea index (AHI) was 0.0 per hour. There were 0 total apneas, including 0 obstructive, 0 central and 0 mixed apneas. There were 0 hypopneas and 5 RERAs. The AHI during Stage REM sleep was 0.0 per hour. AHI while supine was 0.0 per hour. The mean oxygen saturation was 97.00%. The minimum SpO2 during sleep was 95.00%. Moderate snoring was noted during this study.  CARDIAC DATA The 2 lead EKG demonstrated sinus rhythm. The mean heart rate was 91.56 beats per minute. Other EKG findings include: None.  LEG MOVEMENT DATA The total PLMS were 0 with a resulting PLMS index of 0.00. Associated arousal with leg movement index was 0.0.  IMPRESSIONS - No evidence for obstructive sleep apnea during this study (AHI = 0.0/h). - No significant central sleep apnea occurred during this study (CAI = 0.0/h). - Reduced sleep efficiency at 66.5% - Abnormal sleep architecture with prolonged latency to REM sleep. - No evidence for oxygen desaturation  (Min O2 = 95.00%). - The patient snored with Moderate snoring volume. - No cardiac abnormalities were noted during this study. - Clinically significant periodic limb movements did not occur during sleep. No significant associated arousals.  DIAGNOSIS - Snoring  RECOMMENDATIONS - There is no indication for CPAP therapy.  - Consider alternatives for moderate snoring. - Avoid alcohol, sedatives and other CNS depressants that may worsen sleep apnea and disrupt normal sleep architecture. - Sleep hygiene should be reviewed to assess factors that may improve sleep quality. - Weight management (BMI 34) and regular exercise should be initiated or continued if appropriate.   [Electronically signed] 02/24/2016 12:13 PM  Shelva Majestic MD,  [facility], [place], American Board of Sleep Medicine   NPI: [id number] [facility] PH: [phone]   FX: [phone] ACCREDITED BY THE AMERICAN ACADEMY OF SLEEP MEDICINE

## 2016-02-25 ENCOUNTER — Encounter: Payer: Self-pay | Admitting: Gastroenterology

## 2016-02-25 ENCOUNTER — Ambulatory Visit (INDEPENDENT_AMBULATORY_CARE_PROVIDER_SITE_OTHER): Payer: Commercial Managed Care - PPO | Admitting: Gastroenterology

## 2016-02-25 VITALS — BP 143/94 | HR 104 | Temp 98.1°F | Ht 66.5 in | Wt 213.0 lb

## 2016-02-25 DIAGNOSIS — R7989 Other specified abnormal findings of blood chemistry: Secondary | ICD-10-CM | POA: Diagnosis not present

## 2016-02-25 DIAGNOSIS — R945 Abnormal results of liver function studies: Principal | ICD-10-CM

## 2016-02-25 NOTE — Patient Instructions (Signed)
Please have blood work done. We will make sure everything has resolved.   I am glad things are better!

## 2016-03-01 NOTE — Telephone Encounter (Signed)
error 

## 2016-03-03 ENCOUNTER — Telehealth: Payer: Self-pay | Admitting: Gastroenterology

## 2016-03-03 NOTE — Telephone Encounter (Signed)
Can we see if she has gotten her LFTs done yet? Thanks!

## 2016-03-03 NOTE — Assessment & Plan Note (Signed)
45 year old female with recent admission for choledocholithiasis, undergoing ERCP with sphincterotomy and stone extraction. Clinically doing well with complete resolution of symptoms. Needs recheck of HFP now. Next colonoscopy age 50.

## 2016-03-03 NOTE — Progress Notes (Signed)
Referring Provider: Celene Squibb, MD Primary Care Physician:  [name], MD  Primary GI: Dr. Oneida Alar   Chief Complaint  Patient presents with  . Follow-up    doing ok now     HPI:   [name] [name] is a 44 y.o. female presenting today with a history of choledocholithiasis, undergoing ERCP with sphincterotomy and stone extraction 01/13/16. She is doing quite well now. Denies abdominal pain, N/V, jaundice, fever, chills. No complaints. No constipation, diarrhea, rectal bleeding. Needs updated HFP now.   Past Medical History:  Diagnosis Date  . Anemia    As a child and while pregnant  . 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 Depression    parents died close together "was hard"  . Dyspnea    all the time .".due to the mass"  . Eczema   . Fibromyalgia   . Headache(784.0)    Birth control pills help  . Hemorrhoid   . History of radiation therapy 10/28/12-11/20/12   chest & neck,34Gy/64f  . Hypertension    recently dx with hodgkins  . Restless leg   . S/P cholecystectomy 09/14/2013   08/29/2013 by Dr. JArnoldo Morale  Negative pathology  . Sleep apnea    Sleep Study- 1996- "little apnea- no tx"    Past Surgical History:  Procedure Laterality Date  . BONE MARROW BIOPSY  03/20/2012  . CHOLECYSTECTOMY N/A 08/29/2013   Procedure: LAPAROSCOPIC CHOLECYSTECTOMY;  Surgeon: MJamesetta So MD;  [facility]: [facility];  Service: General;  Laterality: N/A;  . DILATION AND CURETTAGE OF UTERUS  1991  . ERCP N/A 01/12/2016   Procedure: ENDOSCOPIC RETROGRADE CHOLANGIOPANCREATOGRAPHY (ERCP);  Surgeon: SDanie Binder MD;  [facility];  Service: Endoscopy;  Laterality: N/A;  . Mediastinal Mass Biopsy  03/05/12  . PORT-A-CATH REMOVAL Left 10/16/2012   Procedure: REMOVAL PORT-A-CATH;  Surgeon: SMelrose Nakayama MD;  [place] [facility]  Service: Thoracic;  Laterality: Left;  . PORTACATH PLACEMENT  03/26/2012   Procedure: INSERTION PORT-A-CATH;  Surgeon: SMelrose Nakayama MD;  [place] [facility]  Service: Thoracic;  Laterality: N/A;  . REMOVAL OF STONES N/A 01/12/2016   Procedure: REMOVAL OF STONES;  Surgeon: SDanie Binder MD;  [facility];  Service: Endoscopy;  Laterality: N/A;  . SPHINCTEROTOMY N/A 01/12/2016   Procedure: SPHINCTEROTOMY;  Surgeon: SDanie Binder MD;  [facility];  Service: Endoscopy;  Laterality: N/A;  . WISDOM TOOTH EXTRACTION      Current Outpatient Prescriptions  Medication Sig Dispense Refill  . cyclobenzaprine (FLEXERIL) 10 MG tablet Take 10 mg by mouth at bedtime.    .Lenda KelpFE 1/20 1-20 MG-MCG tablet Take 1 tablet by mouth daily.    .Marland Kitchenlevothyroxine (SYNTHROID, LEVOTHROID) 125 MCG tablet Take 125 mcg by mouth daily before breakfast.    . temazepam (RESTORIL) 15 MG capsule Take 15 mg by mouth at bedtime.     . B Complex-C-E-Zn (B COMPLEX-C-E-ZINC) tablet Take 1 tablet by mouth daily. Reported on 09/03/2015    . cholecalciferol (VITAMIN D) 1000 UNITS tablet Take 1,000 Units by mouth daily. Reported on 09/03/2015    . Cyanocobalamin (VITAMIN B-12 PO) Take 1 capsule by mouth daily.    . hydrochlorothiazide (HYDRODIURIL) 12.5 MG tablet Take 1 tablet (12.5 mg total) by mouth daily. (Patient not taking: Reported on 02/25/2016) 30 tablet 3  . HYDROcodone-acetaminophen (NORCO/VICODIN) 5-325 MG tablet Take 2 tablets by mouth every  4 (four) hours as needed. (Patient not taking: Reported on 02/25/2016) 10 tablet 0  . Ibuprofen-Diphenhydramine Cit (ADVIL PM PO) Take 2 tablets by mouth at bedtime as needed (pain/sleep).     . KRILL OIL PO Take 1 capsule by mouth daily.    . MAGNESIUM PO Take 450 mg by mouth daily.    . Multiple Vitamins-Minerals (ANTIOXIDANT FORMULA) CAPS Take 1 capsule by mouth daily. Reported on 09/03/2015    . Multiple Vitamins-Minerals (HAIR/SKIN/NAILS/BIOTIN PO) Take 1 capsule by mouth daily. Reported on 09/03/2015    . piroxicam (FELDENE) 20 MG capsule Take 1 capsule by mouth daily as needed  (plantar facitis.).     . promethazine (PHENERGAN) 12.5 MG tablet 1-2 PO 30 MINS PRIOR TO MEALS UP TO THREE TIMES A DAY. DOSES SHOULD BE 4 HRS APART. (Patient not taking: Reported on 02/25/2016) 30 tablet 0  . tiZANidine (ZANAFLEX) 4 MG tablet Take 4 mg by mouth at bedtime.     No current facility-administered medications for this visit.    Facility-Administered Medications Ordered in Other Visits  Medication Dose Route Frequency Provider Last Rate Last Dose  . sodium chloride 0.9 % injection 10 mL  10 mL Intravenous PRN Eric Neijstrom, MD   10 mL at 10/03/12 1643    Allergies as of 02/25/2016 - Review Complete 02/25/2016  Allergen Reaction Noted  . Other Other (See Comments) 10/09/2012  . Sulfur Anaphylaxis 09/14/2010    Family History  Problem Relation Age of Onset  . Cancer Paternal Uncle   . Cancer Maternal Grandmother     lung ca mets to throat    Social History   Social History  . Marital status: Married    Spouse name: N/A  . Number of children: N/A  . Years of education: N/A   Social History Main Topics  . Smoking status: Never Smoker  . Smokeless tobacco: Never Used  . Alcohol use 0.0 oz/week    0 drink(s) per week     Comment: occasionaly 2 x month; rarely  . Drug use: No  . Sexual activity: Yes    Birth control/ protection: Pill   Other Topics Concern  . 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     Review of Systems: Negative unless mentioned in HPI.   Physical Exam: BP (!) 143/94   Pulse (!) 104   Temp 98.1 F (36.7 C) (Oral)   Ht 5' 6.5" (1.689 m)   Wt 213 lb (96.6 kg)   LMP 02/11/2012   BMI 33.86 kg/m  General:   Alert and oriented. No distress noted. Pleasant and cooperative.  Head:  Normocephalic and atraumatic. Eyes:  Conjuctiva clear without scleral icterus. Abdomen:  +BS, soft, non-tender and non-distended. No rebound or guarding. No HSM or masses noted. Msk:   Symmetrical without gross deformities. Normal posture. Extremities:  Without edema. Neurologic:  Alert and  oriented x4;  grossly normal neurologically. Psych:  Alert and cooperative. Normal mood and affect.  

## 2016-03-06 NOTE — Progress Notes (Signed)
CC'D TO PCP °

## 2016-03-07 NOTE — Telephone Encounter (Signed)
Pt said she just had them done at her work today, and she will bring the results by in a day or so.

## 2016-03-08 ENCOUNTER — Telehealth (HOSPITAL_COMMUNITY): Payer: Self-pay | Admitting: *Deleted

## 2016-03-08 NOTE — Telephone Encounter (Signed)
Pt called for sleep study results:  RECOMMENDATIONS - There is no indication for CPAP therapy.  - Consider alternatives for moderate snoring. - Avoid alcohol, sedatives and other CNS depressants that may worsen sleep apnea and disrupt normal sleep architecture. - Sleep hygiene should be reviewed to assess factors that may improve sleep quality. - Weight management (BMI 34) and regular exercise should be initiated or continued if appropriate.   [Electronically signed] 02/24/2016 12:13 PM  Shelva Majestic MD, [facility], ABSM Diplomate, American Board of Sleep Medicine  Advised per above, copy sent to her pcp per her request

## 2016-03-10 ENCOUNTER — Other Ambulatory Visit (HOSPITAL_COMMUNITY): Payer: Commercial Managed Care - PPO

## 2016-03-10 ENCOUNTER — Ambulatory Visit (HOSPITAL_COMMUNITY): Payer: Commercial Managed Care - PPO | Admitting: Hematology & Oncology

## 2016-03-14 NOTE — Progress Notes (Signed)
Left message of results for pt  

## 2016-03-15 ENCOUNTER — Other Ambulatory Visit (HOSPITAL_COMMUNITY): Payer: Self-pay | Admitting: Respiratory Therapy

## 2016-03-15 DIAGNOSIS — R0602 Shortness of breath: Secondary | ICD-10-CM

## 2016-03-22 ENCOUNTER — Encounter (HOSPITAL_COMMUNITY): Payer: Self-pay | Admitting: Oncology

## 2016-03-22 ENCOUNTER — Encounter (HOSPITAL_COMMUNITY): Payer: Commercial Managed Care - PPO | Attending: Oncology | Admitting: Oncology

## 2016-03-22 VITALS — BP 145/88 | HR 113 | Temp 98.1°F | Resp 16 | Ht 66.5 in | Wt 213.0 lb

## 2016-03-22 DIAGNOSIS — C8110 Nodular sclerosis classical Hodgkin lymphoma, unspecified site: Secondary | ICD-10-CM

## 2016-03-22 DIAGNOSIS — R0602 Shortness of breath: Secondary | ICD-10-CM

## 2016-03-22 NOTE — Telephone Encounter (Signed)
Received outside labs.  CBC normal. LFTs completely normalized.   Will have scanned into epic.   Annitta Needs, ANP-BC [facility]

## 2016-03-22 NOTE — Progress Notes (Signed)
[name] Neighbors, MD [place] [address]  Nodular sclerosing Hodgkin's lymphoma, unspecified body region [facility]) - Plan: CBC with Differential, Comprehensive metabolic panel, Lactate dehydrogenase  Shortness of breath - Plan: CT Chest W Contrast  CURRENT THERAPY: Surveillance per NCCN guidelines  INTERVAL HISTORY: [name] [name] 45 y.o. 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 She was then switched to dose adjusted BEACOPP x2 cycles. Her PET scan after the second cycle showed complete remission. Because of probable inflammatory changes in the lungs and a prior Citrobacter blood stream/pulmonary infection felt to be the cause of the PET scan findings in the lungs she has had Levaquin which she is now finished. Now S/P involved field radiation by Dr. Thea Silversmith ([place]) finishing on 11/20/2012.  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 She was then switched to dose adjusted BEACOPP x2 cycles. Her PET scan after the second cycle showed complete remission. Because of probable inflammatory changes in the lungs and a prior Citrobacter blood stream/pulmonary infection felt to be the cause of the PET scan findings in the lungs she has had Levaquin which she is now finished. Now S/P involved field radiation by Dr. Thea Silversmith ([place]) finishing on 11/20/2012.     Hodgkin's lymphoma ([place])   09/14/2010 Imaging    Chest xray demonstrating- Possible mediastinal mass  as discussed above.  Recommend chest CT scan for further evaluation.  Further work-up by ordering physician never performed      02/26/2012 Imaging    CT of chest- Large anterior mediastinal mass is identified and is worrisome for tumor.  This is encasing the great vessels and S causing significant narrowing of the left subclavian vein.      03/05/2012 Bone Marrow Biopsy    Negative      03/05/2012 Initial Diagnosis    Anterior mediastinal mass biopsy- Classical Hodgkin's lymphoma      03/13/2012 Imaging    PET scan- hypermetabolic anterior mediastinal mass, extension of dominant mass in the low left neck, isolated small right lower jugular hypermetabolic node, no subdiaphragmatic disease.      03/27/2012 - 08/07/2012 Chemotherapy    ABVD x 5 cycles but with incomplete response evaluated after cycle 3 and after cycle #5.      06/20/2012 Imaging    PET scan- interval response to therapy as evidenced by decrease in size and metabolic activity involving the anterior mediastinal mass      08/16/2012 Imaging    PET scan- The anterior mediastinal soft tissue mass shows no interval change in size and there is a persistent small peripheral focus of hypermetabolism within this lesion on today's exam.      08/28/2012 - 09/25/2012 Chemotherapy    BEACOPP x 2 cycles      10/08/2012 Imaging    PET scan- Today's study demonstrates a positive response to therapy with decrease in size and resolution of hypermetabolism within the anterior mediastinal nodal mass.  The mass currently measures  6.2 x 2.5 cm.      10/09/2012 Remission         10/23/2012 - 11/20/2012 Radiation Therapy    Dr. Pablo Ledger      12/02/2012 Adverse Reaction    Radiation recall- negative work-up.  Treated with Prednisone.      05/26/2013 Imaging    PET scan- No substantial change in size of the anterior mediastinal mass although there is no hypermetabolism within this lesion on the current study.  Interval decrease in spleen  size.      11/12/2013 Imaging    CT CAP- decrease size in medistinal mass.  NED.      02/09/2014 PET scan    Persistent soft tissue within the anterior mediastinum with very low level metabolic activity (Deauville Criteria 2). Findings likely reflect treated tumor.       06/11/2014 Mammogram    Negative      07/01/2014 Imaging    Xray of orbits- No evidence of metallic foreign body within the orbits.      08/27/2015 Imaging    CT C/A/P Further decreased size of sessile appearing nodal lesion in the anterior mediastinum compared to prior study. no findings to suggest recurrent disease in the chest abdomen or pelvis       Chart is reviewed and recent hospitalization is noted.  On another note, she reports a progressive dyspnea on exertion.  She reports riding a bicycle on flat ground and noting that she became very short of breat with panting.  It improved with rest.  She tried taking her inhaler prior, but without improvement.  She notes that walking up 1 flight of stairs is difficult for her at work.  She notes that she must rest following this exertion.  She notes that it is embarrassing, but she feels as though she will pass out.  She denies a cough.  She denies a runny nose.  She denies allergy symptoms.  She denies any chest pain.  She notes that air feels heavy, "like concrete."    Review of Systems  Constitutional: Negative.  Negative for chills, fever and weight loss.  HENT: Negative.   Eyes: Negative.   Respiratory: Positive for shortness of breath. Negative for cough, hemoptysis, sputum production and wheezing.   Cardiovascular: Negative.  Negative for chest pain, palpitations and leg swelling.  Gastrointestinal: Negative.   Genitourinary: Negative.   Musculoskeletal: Negative.  Negative for falls.  Skin: Negative.  Negative for rash.  Neurological: Negative.  Negative for weakness and headaches.  Endo/Heme/Allergies: Negative.   Psychiatric/Behavioral: Negative.      Past Medical History:  Diagnosis Date  . Anemia    As a child and while pregnant  . 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 Depression    parents died close together "was hard"  . Dyspnea    all the time .".due to the mass"  . Eczema   . Fibromyalgia   . Headache(784.0)    Birth control pills help  . Hemorrhoid   . History of radiation therapy 10/28/12-11/20/12   chest & neck,34Gy/3f  . Hypertension    recently dx with hodgkins  . Restless leg   . S/P cholecystectomy 09/14/2013   08/29/2013 by Dr. JArnoldo Morale  Negative pathology  . Sleep apnea    Sleep Study- 1996- "little apnea- no tx"    Past Surgical History:  Procedure Laterality Date  . BONE MARROW BIOPSY  03/20/2012  . CHOLECYSTECTOMY N/A 08/29/2013  Procedure: LAPAROSCOPIC CHOLECYSTECTOMY;  Surgeon: Jamesetta So, MD;  [facility];  Service: General;  Laterality: N/A;  . DILATION AND CURETTAGE OF UTERUS  1991  . ERCP N/A 01/12/2016   Procedure: ENDOSCOPIC RETROGRADE CHOLANGIOPANCREATOGRAPHY (ERCP);  Surgeon: Danie Binder, MD;  [facility];  Service: Endoscopy;  Laterality: N/A;  . Mediastinal Mass Biopsy  03/05/12  . PORT-A-CATH REMOVAL Left 10/16/2012   Procedure: REMOVAL PORT-A-CATH;  Surgeon: Melrose Nakayama, MD;  Location: [place];  Service: Thoracic;  Laterality: Left;  . PORTACATH PLACEMENT  03/26/2012   Procedure: INSERTION PORT-A-CATH;  Surgeon: Melrose Nakayama, MD;  Location: [place];  Service: Thoracic;  Laterality: N/A;  . REMOVAL OF STONES N/A 01/12/2016   Procedure: REMOVAL OF STONES;  Surgeon: Danie Binder, MD;  [facility];  Service: Endoscopy;  Laterality: N/A;  . SPHINCTEROTOMY N/A 01/12/2016   Procedure: SPHINCTEROTOMY;  Surgeon: Danie Binder, MD;  [facility];  Service: Endoscopy;  Laterality: N/A;  . WISDOM TOOTH EXTRACTION      Family History  Problem Relation Age of Onset  . Cancer Paternal Uncle   . Cancer  Maternal Grandmother     lung ca mets to throat    Social History   Social History  . Marital status: Married    Spouse name: N/A  . Number of children: N/A  . Years of education: N/A   Social History Main Topics  . Smoking status: Never Smoker  . Smokeless tobacco: Never Used  . Alcohol use 0.0 oz/week    0 drink(s) per week     Comment: occasionaly 2 x month; rarely  . Drug use: No  . Sexual activity: Yes    Birth control/ protection: Pill   Other Topics Concern  . 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      PHYSICAL EXAMINATION  ECOG PERFORMANCE STATUS: 1 - Symptomatic but completely ambulatory  Vitals:   03/22/16 1300  BP: (!) 145/88  Pulse: (!) 113  Resp: 16  Temp: 98.1 F (36.7 C)    GENERAL:alert, no distress, well nourished, well developed, comfortable, cooperative, obese, smiling and unaccompanied SKIN: skin color, texture, turgor are normal, no rashes or significant lesions HEAD: Normocephalic, No masses, lesions, tenderness or abnormalities EYES: normal, EOMI, Conjunctiva are pink and non-injected EARS: External ears normal OROPHARYNX:lips, buccal mucosa, and tongue normal and mucous membranes are moist  NECK: supple, no adenopathy, thyroid normal size, non-tender, without nodularity, trachea midline LYMPH:  no palpable lymphadenopathy, no hepatosplenomegaly BREAST:not examined LUNGS: clear to auscultation and percussion, decreased breath sounds on the right (minimal) HEART: regular rate & rhythm, no murmurs, no gallops, S1 normal and S2 normal ABDOMEN:abdomen soft, non-tender, obese, normal bowel sounds and no masses or organomegaly BACK: Back symmetric, no curvature. EXTREMITIES:less then 2 second capillary refill, no joint deformities, effusion, or inflammation, no edema, no skin discoloration, no clubbing, no cyanosis  NEURO: alert & oriented x 3 with fluent speech, no  focal motor/sensory deficits, gait normal   LABORATORY DATA: CBC        PENDING LABS:   RADIOGRAPHIC STUDIES:  No results found.   PATHOLOGY:    ASSESSMENT AND PLAN:  Hodgkin's lymphoma Stage II A. bulky mediastinal nodular sclerosing Hodgkin's disease presenting with a large anterior mediastinal mass as well as left cervical and right cervical lymph nodes status post 5 cycles of ABVD but with  incomplete response evaluated after cycle 3 and after cycle #5. She was then switched to dose adjusted BEACOPP x2 cycles. Her PET scan after the second cycle showed complete remission. Because of probable inflammatory changes in the lungs and a prior Citrobacter blood stream/pulmonary infection felt to be the cause of the PET scan findings in the lungs she has had Levaquin which she is now finished. Now S/P involved field radiation by Dr. Thea Silversmith ([place]) finishing on 11/20/2012.    She had labs for work on 03/07/2016: CBC diff, CMET, TSH.  I personally reviewed and went over laboratory results with the patient.  The results are noted within this dictation.  Labs are WNL and stable.  Chart is reviewed.  Recent hospitalization ~2-3 months ago is noted and reviewed for choledocholithiasis requiring ERCP, stone removal and sphincterotomy by Dr. Oneida Alar as an inpatient.  She reports progressive dyspnea on exertion x months.  Her work-up thus far has included stress test and trial of an inhaler.  She is scheduled for PFTs upcoming.  Her last chest imaging was in March 2017 but this was prior to her recent symptom.  I personally reviewed and went over radiographic studies with the patient.  The results are noted within this dictation.    Order placed for CT chest w contrast in the next week.  Labs in 6 months: CBC diff, CMET, LDH, TSH.  She is given an order to have these labs performed at her work.  Return in 6 months for follow-up.  We will call her with CT imaging results and if  necessary, have her report for a follow-up appointment to review.   ORDERS PLACED FOR THIS ENCOUNTER: Orders Placed This Encounter  Procedures  . CT Chest W Contrast  . CBC with Differential  . Comprehensive metabolic panel  . Lactate dehydrogenase    MEDICATIONS PRESCRIBED THIS ENCOUNTER: No orders of the defined types were placed in this encounter.   THERAPY PLAN:  NCCN guidelines for surveillance of Hodgkin's Lymphoma (Age >/= 59) (1.2017):  A. Complete remission should be documented including reversion of PET to "negative" within 3 months following completion of therapy.  B. It is recommended that the patient be provided with a treatment summary at the completion of his/her therapy, including details of radiation therapy, organs at risk, and cumulative anthracycline dosage given.  C. Follow-up with an oncologist is recommended, especially during the first 5 years after treatment to detect recurrence, and then annually due to the risk of late complications including second cancers and cardiovascular disease. Late relapse or transformation to large cell lymphoma may occur in [place].  D. Frequency and types of tests may vary depending on clinical circumstances: age and stage at diagnosis, social habits, treatment modality, etc. There are few data to support specific recommendations; these represent the range of practice at [facility].   1. Follow-up after completion of treatment up to 5 years:   A. Interim H and P: Every 3-6 months for 1-2 years, then every 6-12 months until year 3, then annually.   B. Annual influenza vaccine.   C. Laboratory studies:    1. CBC, platelets, ESR (if elevated at time of initial diagnosis), chemistry profile as clinically indicated.    2. Thyroid stimulating hormone (TSH) at least annually if radiation therapy to neck.   D. Acceptable to obtain a neck/chest/abdomen/pelvis CT scan with contrast, at 6, 12, and 24 month following completion of  therapy, or as clinically indicated. PET/CT only  if last PET was Deauville 4-5, to confirm complete response.   E. Counseling: Reproduction, health habits, psychosocial, cardiovascular, breast self-exam, skin cancer risk, end of treatment discussion.   F. Surveillance PET should not be done routinely due to risk of false positives. Management decision should not be based on PET scan alone; clinical or pathologic correlation is needed.   2. Follow-up and monitoring after 5 years:   A. Interim H&P: Annually    1. Annual blood pressure, aggressive management of cardiovascular risk factors.    2. Pneumococcal, meningococcal, and H-flu revaccination after 5-7 years, if patient treated with splenic radiation therapy or previous splenectomy (according to CBC recommendations).    3. Annual influenza vaccine.   B. Cardiovascular symptoms may emerge at a young age.    1. Consider stress test/echocardiogram at 10 year intervals after treatment is completed.    2. Consider carotid ultrasound at 10 year intervals if neck irradiation.   C. Laboratory studies:    1. CBC, platelets, chemistry profile annually    2. TSH at least annually if radiation therapy to neck    3. Biannual lipids    4. Annual fasting glucose   D. Annual breast screening: Initiate 8-10 years post therapy, or at age 46, whichever comes first, if chest or axillary radiation. The NCCN Hodgkin Lymphoma Guidelines Panel recommends breast MRI in addition to mammography for women who've received irradiation to the chest between ages 68-30 years, which is consistent with the [place] (ACS) Guidelines. Consider referral to a breast specialist.   E. Perform other routine surveillance tests for cervical, colorectal, endometrial, lung, and prostate cancer as per the ACS Cancer Screening Guidelines.   F. Counseling: Reproduction, health habits, psychosocial, cardiovascular, breast self-exam, and skin cancer risk.   G. Treatment summary  and consideration of transfer to primary care provider.   H. Consider a referral to a survivorship clinic.     All questions were answered. The patient knows to call the clinic with any problems, questions or concerns. We can certainly see the patient much sooner if necessary.  Patient and plan discussed with Dr. Ancil Linsey and she is in agreement with the aforementioned.   This note is electronically signed by: Doy Mince 03/22/2016 6:17 PM

## 2016-03-22 NOTE — Assessment & Plan Note (Addendum)
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 She was then switched to dose adjusted BEACOPP x2 cycles. Her PET scan after the second cycle showed complete remission. Because of probable inflammatory changes in the lungs and a prior Citrobacter blood stream/pulmonary infection felt to be the cause of the PET scan findings in the lungs she has had Levaquin which she is now finished. Now S/P involved field radiation by Dr. Thea Silversmith ([place]) finishing on 11/20/2012.    She had labs for work on 03/07/2016: CBC diff, CMET, TSH.  I personally reviewed and went over laboratory results with the patient.  The results are noted within this dictation.  Labs are WNL and stable.  Chart is reviewed.  Recent hospitalization ~2-3 months ago is noted and reviewed for choledocholithiasis requiring ERCP, stone removal and sphincterotomy by Dr. Oneida Alar as an inpatient.  She reports progressive dyspnea on exertion x months.  Her work-up thus far has included stress test and trial of an inhaler.  She is scheduled for PFTs upcoming.  Her last chest imaging was in March 2017 but this was prior to her recent symptom.  I personally reviewed and went over radiographic studies with the patient.  The results are noted within this dictation.    Order placed for CT chest w contrast in the next week.  Labs in 6 months: CBC diff, CMET, LDH, TSH.  She is given an order to have these labs performed at her work.  Return in 6 months for follow-up.  We will call her with CT imaging results and if necessary, have her report for a follow-up appointment to review.

## 2016-03-22 NOTE — Patient Instructions (Signed)
[place] at [facility] Discharge Instructions  RECOMMENDATIONS MADE BY THE CONSULTANT AND ANY TEST RESULTS WILL BE SENT TO YOUR REFERRING PHYSICIAN.  You were seen today by Kirby Crigler PA-C. CT of chest has been ordered. Rx for labs given today. Pulmonary function tests as ordered by Dr. Nevada Crane.. Follow up in 6 months.   Thank you for choosing [facility] to provide your oncology and hematology care.  To afford each patient quality time with our provider, please arrive at least 15 minutes before your scheduled appointment time.   Beginning January 23rd 2017 lab work for the [facility] will be done in the  Main lab at [facility] on 1st floor. If you have a lab appointment with the [facility] please come in thru the  Main Entrance and check in at the main information desk  You need to re-schedule your appointment should you arrive 10 or more minutes late.  We strive to give you quality time with our providers, and arriving late affects you and other patients whose appointments are after yours.  Also, if you no show three or more times for appointments you may be dismissed from the clinic at the providers discretion.     Again, thank you for choosing [facility].  Our hope is that these requests will decrease the amount of time that you wait before being seen by our physicians.       _____________________________________________________________  Should you have questions after your visit to [facility], please contact our office at [phone] between the hours of 8:30 a.m. and 4:30 p.m.  Voicemails left after 4:30 p.m. will not be returned until the following business day.  For prescription refill requests, have your pharmacy contact our office.         Resources For Cancer Patients and their Caregivers ? American Cancer Society: Can assist with transportation, wigs, general needs, runs Look  Good Feel Better.        (570)256-8738 ? Cancer Care: Provides financial assistance, online support groups, medication/co-pay assistance.  1-800-813-HOPE 780-056-3259) ? [facility] Assists [place] cancer patients and their families through emotional , educational and financial support.  925-133-5745 ? [place] DSS Where to apply for food stamps, Medicaid and utility assistance. (913)417-0442 ? [employer]: Transportation to medical appointments. 939-516-0027 ? Social Security Administration: May apply for disability if have a Stage IV cancer. (613)846-0408 (520)293-1902 ? [employer], Disability and Transit Services: Assists with nutrition, care and transit needs. [place] Support Programs: @10RELATIVEDAYS @ > Cancer Support Group  2nd Tuesday of the month 1pm-2pm, Journey Room  > Creative Journey  3rd Tuesday of the month 1130am-1pm, Journey Room  > Look Good Feel Better  1st Wednesday of the month 10am-12 noon, Journey Room (Call [place] to register [phone])

## 2016-03-24 ENCOUNTER — Ambulatory Visit (HOSPITAL_COMMUNITY)
Admission: RE | Admit: 2016-03-24 | Discharge: 2016-03-24 | Disposition: A | Discharge status: Home / Self Care | Payer: Commercial Managed Care - PPO | Source: Ambulatory Visit | Attending: Internal Medicine | Admitting: Internal Medicine

## 2016-03-24 DIAGNOSIS — R0602 Shortness of breath: Secondary | ICD-10-CM | POA: Insufficient documentation

## 2016-03-24 MED ORDER — ALBUTEROL SULFATE (2.5 MG/3ML) 0.083% IN NEBU
2.5000 mg | INHALATION_SOLUTION | Freq: Once | RESPIRATORY_TRACT | Status: AC
  Administered 2016-03-24: 2.5 mg via RESPIRATORY_TRACT

## 2016-03-26 LAB — PULMONARY FUNCTION TEST
DL/VA % pred: 68 %
DL/VA: 3.51 ml/min/mmHg/L
DLCO unc % pred: 86 %
DLCO unc: 23.84 ml/min/mmHg
FEF 25-75 Post: 3.1 L/sec
FEF 25-75 Pre: 2.72 L/sec
FEF2575-%Change-Post: 14 %
FEF2575-%Pred-Post: 102 %
FEF2575-%Pred-Pre: 89 %
FEV1-%Change-Post: 4 %
FEV1-%Pred-Post: 95 %
FEV1-%Pred-Pre: 91 %
FEV1-Post: 2.98 L
FEV1-Pre: 2.86 L
FEV1FVC-%Change-Post: 7 %
FEV1FVC-%Pred-Pre: 97 %
FEV6-%Change-Post: -3 %
FEV6-%Pred-Post: 91 %
FEV6-%Pred-Pre: 94 %
FEV6-Post: 3.49 L
FEV6-Pre: 3.61 L
FEV6FVC-%Change-Post: 0 %
FEV6FVC-%Pred-Post: 102 %
FEV6FVC-%Pred-Pre: 102 %
FVC-%Change-Post: -3 %
FVC-%Pred-Post: 89 %
FVC-%Pred-Pre: 92 %
FVC-Post: 3.49 L
FVC-Pre: 3.61 L
Post FEV1/FVC ratio: 85 %
Post FEV6/FVC ratio: 100 %
Pre FEV1/FVC ratio: 79 %
Pre FEV6/FVC Ratio: 100 %
RV % pred: 135 %
RV: 2.5 L
TLC % pred: 108 %
TLC: 5.89 L

## 2016-03-28 NOTE — Telephone Encounter (Signed)
Pt is aware.  

## 2016-03-30 ENCOUNTER — Encounter (HOSPITAL_COMMUNITY): Payer: Self-pay

## 2016-03-30 ENCOUNTER — Ambulatory Visit (HOSPITAL_COMMUNITY)
Admission: RE | Admit: 2016-03-30 | Discharge: 2016-03-30 | Disposition: A | Discharge status: Home / Self Care | Payer: Commercial Managed Care - PPO | Source: Ambulatory Visit | Attending: Oncology | Admitting: Oncology

## 2016-03-30 DIAGNOSIS — R0602 Shortness of breath: Secondary | ICD-10-CM

## 2016-04-03 ENCOUNTER — Telehealth: Payer: Self-pay | Admitting: General Practice

## 2016-04-03 DIAGNOSIS — R1010 Upper abdominal pain, unspecified: Secondary | ICD-10-CM

## 2016-04-03 LAB — HEPATIC FUNCTION PANEL
ALT: 50 U/L — ABNORMAL HIGH (ref 6–29)
AST: 50 U/L — ABNORMAL HIGH (ref 10–35)
Albumin: 4.1 g/dL (ref 3.6–5.1)
Alkaline Phosphatase: 83 U/L (ref 33–115)
Bilirubin, Direct: 0.5 mg/dL — ABNORMAL HIGH (ref <=0.2)
Indirect Bilirubin: 0.5 mg/dL (ref 0.2–1.2)
Total Bilirubin: 1 mg/dL (ref 0.2–1.2)
Total Protein: 6.6 g/dL (ref 6.1–8.1)

## 2016-04-03 LAB — LIPASE: Lipase: 25 U/L (ref 7–60)

## 2016-04-03 NOTE — Telephone Encounter (Signed)
lmom 

## 2016-04-03 NOTE — Telephone Encounter (Signed)
I tried to call the patient, no answer  

## 2016-04-03 NOTE — Telephone Encounter (Signed)
Patient is made aware of Anna's recommendations and is headed to [facility] to have her labs done

## 2016-04-03 NOTE — Telephone Encounter (Signed)
Check HFP, lipase Follow clear liquids for now.  To ED if pain worsens, fever Let's see what labs are if she can get done today, and we can order appropriate imaging thereafter

## 2016-04-03 NOTE — Telephone Encounter (Signed)
Patient is scheduled to see Randall Hiss on April 10 2016, however she was wondering if you could order a MRCP prior to her visit.  She has the same symptoms that she had prior to her ERCP.  Her symptoms are extreme upper abd pain that occurs shortly after she eats or drinks.   She denies having any fever, nausea or vomiting.  The telephone call was suddenly ended by the patient.  I will try to call her back   Routing to [place] in [place]

## 2016-04-04 ENCOUNTER — Ambulatory Visit (HOSPITAL_COMMUNITY)
Admission: RE | Admit: 2016-04-04 | Discharge: 2016-04-04 | Disposition: A | Discharge status: Home / Self Care | Payer: Commercial Managed Care - PPO | Source: Ambulatory Visit | Attending: Gastroenterology | Admitting: Gastroenterology

## 2016-04-04 ENCOUNTER — Ambulatory Visit (HOSPITAL_COMMUNITY)
Admission: RE | Admit: 2016-04-04 | Discharge: 2016-04-04 | Disposition: A | Discharge status: Home / Self Care | Payer: Commercial Managed Care - PPO | Source: Ambulatory Visit | Attending: Oncology | Admitting: Oncology

## 2016-04-04 ENCOUNTER — Encounter (HOSPITAL_COMMUNITY): Payer: Self-pay | Admitting: *Deleted

## 2016-04-04 ENCOUNTER — Other Ambulatory Visit: Payer: Self-pay

## 2016-04-04 ENCOUNTER — Observation Stay (HOSPITAL_COMMUNITY)
Admission: EM | Admit: 2016-04-04 | Discharge: 2016-04-06 | Disposition: A | Discharge status: Home / Self Care | Payer: Commercial Managed Care - PPO | Attending: Internal Medicine | Admitting: Internal Medicine

## 2016-04-04 DIAGNOSIS — I10 Essential (primary) hypertension: Secondary | ICD-10-CM | POA: Diagnosis not present

## 2016-04-04 DIAGNOSIS — Z888 Allergy status to other drugs, medicaments and biological substances status: Secondary | ICD-10-CM | POA: Insufficient documentation

## 2016-04-04 DIAGNOSIS — Z8571 Personal history of Hodgkin lymphoma: Secondary | ICD-10-CM | POA: Diagnosis not present

## 2016-04-04 DIAGNOSIS — I110 Hypertensive heart disease with heart failure: Secondary | ICD-10-CM | POA: Diagnosis not present

## 2016-04-04 DIAGNOSIS — R945 Abnormal results of liver function studies: Secondary | ICD-10-CM

## 2016-04-04 DIAGNOSIS — Z809 Family history of malignant neoplasm, unspecified: Secondary | ICD-10-CM | POA: Diagnosis not present

## 2016-04-04 DIAGNOSIS — I5032 Chronic diastolic (congestive) heart failure: Secondary | ICD-10-CM | POA: Diagnosis not present

## 2016-04-04 DIAGNOSIS — Z9221 Personal history of antineoplastic chemotherapy: Secondary | ICD-10-CM | POA: Diagnosis not present

## 2016-04-04 DIAGNOSIS — T85528A Displacement of other gastrointestinal prosthetic devices, implants and grafts, initial encounter: Secondary | ICD-10-CM

## 2016-04-04 DIAGNOSIS — E876 Hypokalemia: Secondary | ICD-10-CM | POA: Diagnosis not present

## 2016-04-04 DIAGNOSIS — M797 Fibromyalgia: Secondary | ICD-10-CM | POA: Diagnosis not present

## 2016-04-04 DIAGNOSIS — C8190 Hodgkin lymphoma, unspecified, unspecified site: Secondary | ICD-10-CM | POA: Diagnosis present

## 2016-04-04 DIAGNOSIS — E039 Hypothyroidism, unspecified: Secondary | ICD-10-CM | POA: Insufficient documentation

## 2016-04-04 DIAGNOSIS — Z9049 Acquired absence of other specified parts of digestive tract: Secondary | ICD-10-CM | POA: Insufficient documentation

## 2016-04-04 DIAGNOSIS — K8050 Calculus of bile duct without cholangitis or cholecystitis without obstruction: Principal | ICD-10-CM | POA: Insufficient documentation

## 2016-04-04 DIAGNOSIS — D649 Anemia, unspecified: Secondary | ICD-10-CM | POA: Insufficient documentation

## 2016-04-04 DIAGNOSIS — G8929 Other chronic pain: Secondary | ICD-10-CM

## 2016-04-04 DIAGNOSIS — G4730 Sleep apnea, unspecified: Secondary | ICD-10-CM | POA: Insufficient documentation

## 2016-04-04 DIAGNOSIS — R1013 Epigastric pain: Secondary | ICD-10-CM | POA: Diagnosis not present

## 2016-04-04 DIAGNOSIS — R7989 Other specified abnormal findings of blood chemistry: Secondary | ICD-10-CM | POA: Diagnosis not present

## 2016-04-04 DIAGNOSIS — C8110 Nodular sclerosis classical Hodgkin lymphoma, unspecified site: Secondary | ICD-10-CM | POA: Diagnosis not present

## 2016-04-04 DIAGNOSIS — R1011 Right upper quadrant pain: Principal | ICD-10-CM

## 2016-04-04 DIAGNOSIS — Z801 Family history of malignant neoplasm of trachea, bronchus and lung: Secondary | ICD-10-CM | POA: Diagnosis not present

## 2016-04-04 DIAGNOSIS — R109 Unspecified abdominal pain: Secondary | ICD-10-CM | POA: Diagnosis present

## 2016-04-04 DIAGNOSIS — G2581 Restless legs syndrome: Secondary | ICD-10-CM | POA: Insufficient documentation

## 2016-04-04 DIAGNOSIS — Z882 Allergy status to sulfonamides status: Secondary | ICD-10-CM | POA: Diagnosis not present

## 2016-04-04 DIAGNOSIS — E871 Hypo-osmolality and hyponatremia: Secondary | ICD-10-CM | POA: Diagnosis present

## 2016-04-04 DIAGNOSIS — Z808 Family history of malignant neoplasm of other organs or systems: Secondary | ICD-10-CM | POA: Diagnosis not present

## 2016-04-04 LAB — CBC WITH DIFFERENTIAL/PLATELET
Basophils Absolute: 0 10*3/uL (ref 0.0–0.1)
Basophils Relative: 0 %
Eosinophils Absolute: 0 10*3/uL (ref 0.0–0.7)
Eosinophils Relative: 1 %
HCT: 41.9 % (ref 36.0–46.0)
Hemoglobin: 15.2 g/dL — ABNORMAL HIGH (ref 12.0–15.0)
Lymphocytes Relative: 10 %
Lymphs Abs: 0.9 10*3/uL (ref 0.7–4.0)
MCH: 33.1 pg (ref 26.0–34.0)
MCHC: 36.3 g/dL — ABNORMAL HIGH (ref 30.0–36.0)
MCV: 91.3 fL (ref 78.0–100.0)
Monocytes Absolute: 0.4 10*3/uL (ref 0.1–1.0)
Monocytes Relative: 5 %
Neutro Abs: 7.4 10*3/uL (ref 1.7–7.7)
Neutrophils Relative %: 84 %
Platelets: 219 10*3/uL (ref 150–400)
RBC: 4.59 MIL/uL (ref 3.87–5.11)
RDW: 12.1 % (ref 11.5–15.5)
WBC: 8.8 10*3/uL (ref 4.0–10.5)

## 2016-04-04 LAB — URINALYSIS, ROUTINE W REFLEX MICROSCOPIC
Bilirubin Urine: NEGATIVE
Glucose, UA: NEGATIVE mg/dL
Leukocytes, UA: NEGATIVE
Nitrite: NEGATIVE
Protein, ur: NEGATIVE mg/dL
Specific Gravity, Urine: 1.01 (ref 1.005–1.030)
pH: 6 (ref 5.0–8.0)

## 2016-04-04 LAB — COMPREHENSIVE METABOLIC PANEL
ALT: 100 U/L — ABNORMAL HIGH (ref 14–54)
AST: 93 U/L — ABNORMAL HIGH (ref 15–41)
Albumin: 4.5 g/dL (ref 3.5–5.0)
Alkaline Phosphatase: 133 U/L — ABNORMAL HIGH (ref 38–126)
Anion gap: 12 (ref 5–15)
BUN: 10 mg/dL (ref 6–20)
CO2: 21 mmol/L — ABNORMAL LOW (ref 22–32)
Calcium: 9.6 mg/dL (ref 8.9–10.3)
Chloride: 101 mmol/L (ref 101–111)
Creatinine, Ser: 0.89 mg/dL (ref 0.44–1.00)
GFR calc Af Amer: >60 mL/min (ref >60)
GFR calc non Af Amer: >60 mL/min (ref >60)
Glucose, Bld: 119 mg/dL — ABNORMAL HIGH (ref 65–99)
Potassium: 3.1 mmol/L — ABNORMAL LOW (ref 3.5–5.1)
Sodium: 134 mmol/L — ABNORMAL LOW (ref 135–145)
Total Bilirubin: 2.1 mg/dL — ABNORMAL HIGH (ref 0.3–1.2)
Total Protein: 8.2 g/dL — ABNORMAL HIGH (ref 6.5–8.1)

## 2016-04-04 LAB — LIPASE, BLOOD: Lipase: 24 U/L (ref 11–51)

## 2016-04-04 LAB — URINE MICROSCOPIC-ADD ON

## 2016-04-04 LAB — LACTIC ACID, PLASMA
Lactic Acid, Venous: 2.3 mmol/L (ref 0.5–1.9)
Lactic Acid, Venous: 1.1 mmol/L (ref 0.5–1.9)

## 2016-04-04 LAB — PROTIME-INR
INR: 0.92
Prothrombin Time: 12.3 seconds (ref 11.4–15.2)

## 2016-04-04 LAB — AMMONIA: Ammonia: 13 umol/L (ref 9–35)

## 2016-04-04 MED ORDER — CYCLOBENZAPRINE HCL 10 MG PO TABS
10.0000 mg | ORAL_TABLET | Freq: Every day | ORAL | Status: DC
  Administered 2016-04-05 (×2): 10 mg via ORAL
  Filled 2016-04-04 (×2): qty 1

## 2016-04-04 MED ORDER — HYDROCHLOROTHIAZIDE 25 MG PO TABS: 12.5000 mg | ORAL_TABLET | Freq: Every day | ORAL | Status: DC

## 2016-04-04 MED ORDER — ONDANSETRON HCL 4 MG/2ML IJ SOLN
INTRAMUSCULAR | Status: AC
Start: 2016-04-04 — End: 2016-04-04
  Administered 2016-04-04: 4 mg via INTRAVENOUS
  Filled 2016-04-04: qty 2

## 2016-04-04 MED ORDER — LACTATED RINGERS IV SOLN
INTRAVENOUS | Status: DC
  Administered 2016-04-04: 125 mL via INTRAVENOUS
  Administered 2016-04-05 (×2): via INTRAVENOUS

## 2016-04-04 MED ORDER — TEMAZEPAM 15 MG PO CAPS
15.0000 mg | ORAL_CAPSULE | Freq: Every day | ORAL | Status: DC
  Administered 2016-04-05 (×2): 15 mg via ORAL
  Filled 2016-04-04 (×2): qty 1

## 2016-04-04 MED ORDER — POTASSIUM CHLORIDE 10 MEQ/100ML IV SOLN
10.0000 meq | INTRAVENOUS | Status: AC
  Administered 2016-04-04: 10 meq via INTRAVENOUS

## 2016-04-04 MED ORDER — ENOXAPARIN SODIUM 40 MG/0.4ML ~~LOC~~ SOLN
40.0000 mg | SUBCUTANEOUS | Status: DC
  Filled 2016-04-04: qty 0.4

## 2016-04-04 MED ORDER — LORAZEPAM 2 MG/ML IJ SOLN: 1.0000 mg | Freq: Once | INTRAMUSCULAR | Status: DC

## 2016-04-04 MED ORDER — MORPHINE SULFATE (PF) 2 MG/ML IV SOLN: 2.0000 mg | INTRAVENOUS | Status: DC | PRN

## 2016-04-04 MED ORDER — ONDANSETRON HCL 4 MG PO TABS: 4.0000 mg | ORAL_TABLET | Freq: Four times a day (QID) | ORAL | Status: DC | PRN

## 2016-04-04 MED ORDER — ACETAMINOPHEN 650 MG RE SUPP: 650.0000 mg | Freq: Four times a day (QID) | RECTAL | Status: DC | PRN

## 2016-04-04 MED ORDER — POTASSIUM CHLORIDE 10 MEQ/100ML IV SOLN
10.0000 meq | INTRAVENOUS | Status: DC
  Administered 2016-04-04: 10 meq via INTRAVENOUS
  Filled 2016-04-04 (×2): qty 100

## 2016-04-04 MED ORDER — NORETHIN ACE-ETH ESTRAD-FE 1-20 MG-MCG PO TABS
1.0000 | ORAL_TABLET | Freq: Every day | ORAL | Status: DC
  Administered 2016-04-05 – 2016-04-06 (×2): 1 via ORAL
  Filled 2016-04-04 (×3): qty 1

## 2016-04-04 MED ORDER — ONDANSETRON HCL 4 MG/2ML IJ SOLN: 4.0000 mg | Freq: Four times a day (QID) | INTRAMUSCULAR | Status: DC | PRN

## 2016-04-04 MED ORDER — ONDANSETRON HCL 4 MG/2ML IJ SOLN
4.0000 mg | Freq: Once | INTRAMUSCULAR | Status: AC
  Administered 2016-04-04: 4 mg via INTRAVENOUS

## 2016-04-04 MED ORDER — LEVOTHYROXINE SODIUM 25 MCG PO TABS
125.0000 ug | ORAL_TABLET | Freq: Every day | ORAL | Status: DC
  Administered 2016-04-05 – 2016-04-06 (×2): 125 ug via ORAL
  Filled 2016-04-04 (×2): qty 1

## 2016-04-04 MED ORDER — HYDROMORPHONE HCL 1 MG/ML IJ SOLN
1.0000 mg | Freq: Once | INTRAMUSCULAR | Status: AC
  Administered 2016-04-04: 1 mg via INTRAVENOUS
  Filled 2016-04-04: qty 1

## 2016-04-04 MED ORDER — ACETAMINOPHEN 325 MG PO TABS
650.0000 mg | ORAL_TABLET | Freq: Four times a day (QID) | ORAL | Status: DC | PRN
  Administered 2016-04-05: 650 mg via ORAL
  Filled 2016-04-04: qty 2

## 2016-04-04 MED ORDER — SODIUM CHLORIDE 0.9 % IV BOLUS (SEPSIS)
1000.0000 mL | Freq: Once | INTRAVENOUS | Status: AC
  Administered 2016-04-04: 1000 mL via INTRAVENOUS

## 2016-04-04 MED ORDER — IOPAMIDOL (ISOVUE-300) INJECTION 61%
100.0000 mL | Freq: Once | INTRAVENOUS | Status: AC | PRN
  Administered 2016-04-04: 100 mL via INTRAVENOUS

## 2016-04-04 NOTE — Telephone Encounter (Signed)
Vicente Males, please review the patient's labs

## 2016-04-04 NOTE — ED Triage Notes (Signed)
Pt c/o severe abdominal pain that started x 3 days ago

## 2016-04-04 NOTE — ED Notes (Signed)
CRITICAL VALUE ALERT  Critical value received:  Lactic Acid 2.3  Date of notification:  04/04/2016  Time of notification:  2058  Critical value read back: yes  Nurse who received alert:  Natividad Brood, RN  MD notified (1st page):    Time of first page:  Dr. Sherry Ruffing  MD notified (2nd page):  Time of second page:  Responding MD:  Dr. Sherry Ruffing  Time MD responded:  2100

## 2016-04-04 NOTE — ED Notes (Signed)
This RN went in to assess this pt to find her hyperventilating. This RN asked this pt to take slow deep breath when a female in the room, whom was standing beside the pt, waved his cell phone in my face stating. "DO NOT tell her to take deep breaths, that is just going to make her mad." The female then stated, "I hope you are the one with the pain medicine." This RN advised the female that I didn't have pain meds and that I would check her orders. This RN went to see if there were orders on this pt, in which there were. This RN was pulling this pt's medicine when the female from the pt's room was out at the nurses station asking about pain meds for this pt. This RN made this man, who is the pt's husband aware that I was in the process of getting her pain meds.

## 2016-04-04 NOTE — Telephone Encounter (Signed)
Lipase normal. LFTs with transaminases at 50. Total bilirubin normal at 1. Alk Phos normal. If persistent symptoms, would proceed with CT abd/pelvis today. I reviewed allergies. I don't see where she is allergic to seafood, shellfish, iodine, that would need pre-medication for CT. LFTs are not impressive (compared to prior presentation).

## 2016-04-04 NOTE — Telephone Encounter (Signed)
Pt is aware routing to clinical pool to schedule the CT Scan today.  Please note that patient would like scan done late this afternoon and she can be reached on her mobile number  Routing to clinical pool

## 2016-04-04 NOTE — ED Provider Notes (Signed)
[name] [name] Provider Note   CSN: [id number] Arrival date & time: 04/04/16  1941     History   Chief Complaint Chief Complaint  Patient presents with  . Abdominal Pain    HPI [name] [name] is a 45 y.o. female With a past medical history significant for Hodgkin filament, hypertension, CHF, and prior cholelithiasis who presents from outpatient radiology area for abdominal pain. Emergency team was called by patient's gastroenterologist, Dr. Oneida Alar, who reports that the patient was getting outpatient CT scan and has uncontrollable abdominal pain. By report, CT scan showed dilation of the bile duct concerning for obstruction. G.I. Team requests patient be evaluated and admitted for further management.  Patient reports that the last three days, she has had severe epigastric abdominal pain. She says it feels similar to her prior cholelithiasis pain. She reports nausea but no significant vomiting. She denies constipation, diarrhea, or dysuria. She reports the pain is greater than tentative in severity. Home medications have not helped. He denies fevers or chills. She is not hungry. She denies any abdominal trauma.     Abdominal Pain   This is a new problem. The current episode started more than 2 days ago (3 days). The problem occurs constantly. The problem has been rapidly worsening. The pain is located in the epigastric region and RUQ. The quality of the pain is sharp. The pain is at a severity of 10/10. The pain is severe. Associated symptoms include anorexia and nausea. Pertinent negatives include fever, diarrhea, vomiting, constipation, dysuria, frequency, hematuria and headaches. The symptoms are aggravated by palpation. Nothing relieves the symptoms. Past workup includes GI consult and CT scan. Her past medical history is significant for gallstones.    Past Medical History:  Diagnosis Date  . Anemia    As a child and while pregnant  . 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 Depression    parents died close together "was hard"  . Dyspnea    all the time .".due to the mass"  . Eczema   . Fibromyalgia   . Headache(784.0)    Birth control pills help  . Hemorrhoid   . History of radiation therapy 10/28/12-11/20/12   chest & neck,34Gy/60f  . Hypertension    recently dx with hodgkins  . Restless leg   . S/P cholecystectomy 09/14/2013   08/29/2013 by Dr. JArnoldo Morale  Negative pathology  . Sleep apnea    Sleep Study- 1996- "little apnea- no tx"    Patient Active Problem List   Diagnosis Date Noted  . Gall stones, common bile duct   . Post-operative state   . Elevated liver enzymes   . Elevated LFTs 01/11/2016  . Lower abdominal pain 01/11/2016  . High bilirubin 01/11/2016  . Exertional dyspnea 12/17/2015  . Chronic diastolic heart failure ([place] 12/17/2015  . Leg edema 01/28/2014  . S/P cholecystectomy 09/14/2013  . Citrobacter infection 09/30/2012  . Hodgkin's lymphoma ([place] 03/20/2012  . Fibromyalgia 02/29/2012  . Eczema   . Hypertension   . Depression   . Restless leg     Past Surgical History:  Procedure Laterality Date  . BONE MARROW BIOPSY  03/20/2012  . CHOLECYSTECTOMY N/A 08/29/2013   Procedure: LAPAROSCOPIC CHOLECYSTECTOMY;  Surgeon: MJamesetta So MD;  Location: [facility];  Service: General;  Laterality: N/A;  . DILATION AND CURETTAGE OF UTERUS  1991  . ERCP N/A 01/12/2016   Procedure: ENDOSCOPIC RETROGRADE CHOLANGIOPANCREATOGRAPHY (ERCP);  Surgeon: SDanie Binder  MD;  Location: [facility];  Service: Endoscopy;  Laterality: N/A;  . Mediastinal Mass Biopsy  03/05/12  . PORT-A-CATH REMOVAL Left 10/16/2012   Procedure: REMOVAL PORT-A-CATH;  Surgeon: Melrose Nakayama, MD;  Location: [place];  Service: Thoracic;  Laterality: Left;  . PORTACATH PLACEMENT  03/26/2012   Procedure: INSERTION PORT-A-CATH;  Surgeon: Melrose Nakayama, MD;  Location: [place];  Service: Thoracic;  Laterality: N/A;  . REMOVAL OF STONES  N/A 01/12/2016   Procedure: REMOVAL OF STONES;  Surgeon: Danie Binder, MD;  Location: [facility];  Service: Endoscopy;  Laterality: N/A;  . SPHINCTEROTOMY N/A 01/12/2016   Procedure: SPHINCTEROTOMY;  Surgeon: Danie Binder, MD;  Location: [facility];  Service: Endoscopy;  Laterality: N/A;  . WISDOM TOOTH EXTRACTION      OB History    Gravida Para Term Preterm AB Living             1   SAB TAB Ectopic Multiple Live Births                   Home Medications    Prior to Admission medications   Medication Sig Start Date End Date Taking? Authorizing Provider  B Complex-C-E-Zn (B COMPLEX-C-E-ZINC) tablet Take 1 tablet by mouth daily. Reported on 09/03/2015    Historical Provider, MD  cholecalciferol (VITAMIN D) 1000 UNITS tablet Take 1,000 Units by mouth daily. Reported on 09/03/2015    Historical Provider, MD  Cyanocobalamin (VITAMIN B-12 PO) Take 1 capsule by mouth daily.    Historical Provider, MD  cyclobenzaprine (FLEXERIL) 10 MG tablet Take 10 mg by mouth at bedtime.    Historical Provider, MD  hydrochlorothiazide (HYDRODIURIL) 12.5 MG tablet Take 1 tablet (12.5 mg total) by mouth daily. Patient not taking: Reported on 02/25/2016 12/16/15   Jolaine Artist, MD  HYDROcodone-acetaminophen (NORCO/VICODIN) 5-325 MG tablet Take 2 tablets by mouth every 4 (four) hours as needed. Patient not taking: Reported on 02/25/2016 01/10/16   Noemi Chapel, MD  Ibuprofen-Diphenhydramine Cit (ADVIL PM PO) Take 2 tablets by mouth at bedtime as needed (pain/sleep).     Historical Provider, MD  JUNEL FE 1/20 1-20 MG-MCG tablet Take 1 tablet by mouth daily. 03/13/14   Historical Provider, MD  KRILL OIL PO Take 1 capsule by mouth daily.    Historical Provider, MD  levothyroxine (SYNTHROID, LEVOTHROID) 125 MCG tablet Take 125 mcg by mouth daily before breakfast.    Historical Provider, MD  MAGNESIUM PO Take 450 mg by mouth daily.    Historical Provider, MD  Multiple Vitamins-Minerals (ANTIOXIDANT FORMULA)  CAPS Take 1 capsule by mouth daily. Reported on 09/03/2015    Historical Provider, MD  Multiple Vitamins-Minerals (HAIR/SKIN/NAILS/BIOTIN PO) Take 1 capsule by mouth daily. Reported on 09/03/2015    Historical Provider, MD  piroxicam (FELDENE) 20 MG capsule Take 1 capsule by mouth daily as needed (plantar facitis.).  12/20/13   Historical Provider, MD  promethazine (PHENERGAN) 12.5 MG tablet 1-2 PO 30 MINS PRIOR TO MEALS UP TO THREE TIMES A DAY. DOSES SHOULD BE 4 HRS APART. Patient not taking: Reported on 02/25/2016 01/11/16   Danie Binder, MD  temazepam (RESTORIL) 15 MG capsule Take 15 mg by mouth at bedtime.     Historical Provider, MD  tiZANidine (ZANAFLEX) 4 MG tablet Take 4 mg by mouth at bedtime. 12/25/12   Baird Cancer, PA-C    Family History Family History  Problem Relation Age of Onset  . Cancer Paternal  Uncle   . Cancer Maternal Grandmother     lung ca mets to throat    Social History Social History  Substance Use Topics  . Smoking status: Never Smoker  . Smokeless tobacco: Never Used  . Alcohol use 0.0 oz/week    0 drink(s) per week     Comment: occasionaly 2 x month; rarely     Allergies   Other and Sulfur   Review of Systems Review of Systems  Constitutional: Negative for activity change, chills, diaphoresis, fatigue and fever.  HENT: Negative for congestion and rhinorrhea.   Eyes: Negative for visual disturbance.  Respiratory: Negative for cough, chest tightness, shortness of breath and stridor.   Cardiovascular: Negative for chest pain, palpitations and leg swelling.  Gastrointestinal: Positive for abdominal pain, anorexia and nausea. Negative for abdominal distention, constipation, diarrhea and vomiting.  Genitourinary: Negative for difficulty urinating, dysuria, flank pain, frequency, hematuria, menstrual problem, pelvic pain, vaginal bleeding and vaginal discharge.  Musculoskeletal: Negative for back pain and neck pain.  Skin: Negative for rash and wound.    Neurological: Negative for dizziness, seizures, weakness, light-headedness, numbness and headaches.  Psychiatric/Behavioral: Negative for agitation and confusion.  All other systems reviewed and are negative.    Physical Exam Updated Vital Signs Ht 5' 5.5" (1.664 m)   Wt 213 lb (96.6 kg)   LMP 02/11/2012   BMI 34.91 kg/m   Physical Exam  Constitutional: She is oriented to person, place, and time. She appears well-developed and well-nourished. No distress.  HENT:  Head: Normocephalic and atraumatic.  Mouth/Throat: Oropharynx is clear and moist. No oropharyngeal exudate.  Eyes: Conjunctivae are normal.  Neck: Neck supple.  Cardiovascular: Normal rate and regular rhythm.   No murmur heard. Pulmonary/Chest: Effort normal and breath sounds normal. No respiratory distress. She has no wheezes. She exhibits no tenderness.  Abdominal: Soft. There is tenderness.  Musculoskeletal: She exhibits no edema.  Neurological: She is alert and oriented to person, place, and time. She exhibits normal muscle tone.  Skin: Skin is warm and dry. Capillary refill takes less than 2 seconds.  Psychiatric: She has a normal mood and affect.  Nursing note and vitals reviewed.    ED Treatments / Results  Labs (all labs ordered are listed, but only abnormal results are displayed) Labs Reviewed  CBC WITH DIFFERENTIAL/PLATELET - Abnormal; Notable for the following:       Result Value   Hemoglobin 15.2 (*)    MCHC 36.3 (*)    All other components within normal limits  COMPREHENSIVE METABOLIC PANEL - Abnormal; Notable for the following:    Sodium 134 (*)    Potassium 3.1 (*)    CO2 21 (*)    Glucose, Bld 119 (*)    Total Protein 8.2 (*)    AST 93 (*)    ALT 100 (*)    Alkaline Phosphatase 133 (*)    Total Bilirubin 2.1 (*)    All other components within normal limits  LACTIC ACID, PLASMA - Abnormal; Notable for the following:    Lactic Acid, Venous 2.3 (*)    All other components within normal  limits  URINALYSIS, ROUTINE W REFLEX MICROSCOPIC (NOT AT [facility]) - Abnormal; Notable for the following:    Hgb urine dipstick MODERATE (*)    Ketones, ur TRACE (*)    All other components within normal limits  BASIC METABOLIC PANEL - Abnormal; Notable for the following:    Calcium 8.1 (*)    All other components within  normal limits  CBC - Abnormal; Notable for the following:    RBC 3.71 (*)    Hemoglobin 11.9 (*)    HCT 34.1 (*)    All other components within normal limits  URINE MICROSCOPIC-ADD ON - Abnormal; Notable for the following:    Squamous Epithelial / LPF 6-30 (*)    Bacteria, UA MANY (*)    All other components within normal limits  HEPATIC FUNCTION PANEL - Abnormal; Notable for the following:    Total Protein 5.7 (*)    Albumin 3.2 (*)    AST 92 (*)    ALT 95 (*)    Total Bilirubin 2.4 (*)    Bilirubin, Direct 1.5 (*)    All other components within normal limits  MRSA PCR SCREENING  LIPASE, BLOOD  PROTIME-INR  AMMONIA  LACTIC ACID, PLASMA  LIPASE, BLOOD    EKG  EKG Interpretation None       Radiology Ct Chest W Contrast  Result Date: 04/04/2016 CLINICAL DATA:  45 year old female with history of severe right upper quadrant abdominal pain for the past 3 days with nausea. History of lymphoma treated in 2014. EXAM: CT CHEST, ABDOMEN, AND PELVIS WITH CONTRAST TECHNIQUE: Multidetector CT imaging of the chest, abdomen and pelvis was performed following the standard protocol during bolus administration of intravenous contrast. CONTRAST:  129m ISOVUE-300 IOPAMIDOL (ISOVUE-300) INJECTION 61% COMPARISON:  CT the abdomen and pelvis 01/10/2016. Chest CT 08/26/2015. FINDINGS: CT CHEST FINDINGS Cardiovascular: Heart size is normal. There is no significant pericardial fluid, thickening or pericardial calcification. Mediastinum/Nodes: Previously demonstrated small amount of sessile soft tissue adjacent to the proximal aortic arch in the anterior mediastinum is slightly smaller  compared to the prior examination measuring only 2.1 x 0.9 cm on today's study (axial image 20 of series 14), again most compatible with treated lymphoma. No other pathologically enlarged mediastinal or hilar lymph nodes. Esophagus is normal in appearance. No axillary lymphadenopathy. Lungs/Pleura: No acute consolidative airspace disease. No pleural effusions. No suspicious appearing pulmonary nodules or masses. Musculoskeletal: There are no aggressive appearing lytic or blastic lesions noted in the visualized portions of the skeleton. CT ABDOMEN PELVIS FINDINGS Hepatobiliary: No discrete cystic or solid hepatic lesions. Status post cholecystectomy. Compared to the prior examinations there is new moderate intrahepatic biliary ductal dilatation. Severe extrahepatic biliary ductal dilatation, with the common bile duct measuring up to 14 mm in the porta hepatis. Notably, immediately before the ampulla there is some intermediate to high attenuation material in the distal common bile duct, which could represent retained ductal stones, sludge balls, or soft tissue in the setting of an ampullary neoplasm. Pancreas: No definite pancreatic mass. No pancreatic ductal dilatation. No pancreatic or peripancreatic fluid or inflammatory changes. Spleen: Unremarkable. Adrenals/Urinary Tract: Bilateral kidneys and bilateral adrenal glands are normal in appearance. No hydroureteronephrosis. Urinary bladder is normal in appearance. Stomach/Bowel: Normal appearance of the stomach. No pathologic dilatation of small bowel or colon. Normal appendix. Vascular/Lymphatic: No significant atherosclerotic disease, aneurysm or dissection identified in the abdominal or pelvic vasculature. Dilated left gonadal vein. No lymphadenopathy noted in the abdomen or pelvis. Reproductive: Uterus and ovaries are unremarkable in appearance. Other: No significant volume of ascites.  No pneumoperitoneum. Musculoskeletal: There are no aggressive appearing lytic  or blastic lesions noted in the visualized portions of the skeleton. IMPRESSION: 1. New biliary tract obstruction which appears to be related to some intermediate to high attenuation material in the distal common bile duct at or immediately before the level of the  ampulla. Whether or not this reflects retained ductal stones, sludge balls or small ampullary neoplasm is uncertain on today's examination, and further evaluation with MRI of the abdomen with and without IV gadolinium with MRCP is suggested. 2. No findings to suggest recurrent lymphoma in the chest, abdomen or pelvis. Evidence of treated lymphoma in the anterior mediastinum is unchanged. 3. Additional incidental findings, as above. These results will be called to the ordering clinician or representative by the Radiologist Assistant, and communication documented in the PACS or zVision Dashboard. Electronically Signed   By: Vinnie Langton M.D.   On: 04/04/2016 18:47   Ct Abdomen Pelvis W Contrast  Result Date: 04/04/2016 CLINICAL DATA:  45 year old female with history of severe right upper quadrant abdominal pain for the past 3 days with nausea. History of lymphoma treated in 2014. EXAM: CT CHEST, ABDOMEN, AND PELVIS WITH CONTRAST TECHNIQUE: Multidetector CT imaging of the chest, abdomen and pelvis was performed following the standard protocol during bolus administration of intravenous contrast. CONTRAST:  131m ISOVUE-300 IOPAMIDOL (ISOVUE-300) INJECTION 61% COMPARISON:  CT the abdomen and pelvis 01/10/2016. Chest CT 08/26/2015. FINDINGS: CT CHEST FINDINGS Cardiovascular: Heart size is normal. There is no significant pericardial fluid, thickening or pericardial calcification. Mediastinum/Nodes: Previously demonstrated small amount of sessile soft tissue adjacent to the proximal aortic arch in the anterior mediastinum is slightly smaller compared to the prior examination measuring only 2.1 x 0.9 cm on today's study (axial image 20 of series 14), again  most compatible with treated lymphoma. No other pathologically enlarged mediastinal or hilar lymph nodes. Esophagus is normal in appearance. No axillary lymphadenopathy. Lungs/Pleura: No acute consolidative airspace disease. No pleural effusions. No suspicious appearing pulmonary nodules or masses. Musculoskeletal: There are no aggressive appearing lytic or blastic lesions noted in the visualized portions of the skeleton. CT ABDOMEN PELVIS FINDINGS Hepatobiliary: No discrete cystic or solid hepatic lesions. Status post cholecystectomy. Compared to the prior examinations there is new moderate intrahepatic biliary ductal dilatation. Severe extrahepatic biliary ductal dilatation, with the common bile duct measuring up to 14 mm in the porta hepatis. Notably, immediately before the ampulla there is some intermediate to high attenuation material in the distal common bile duct, which could represent retained ductal stones, sludge balls, or soft tissue in the setting of an ampullary neoplasm. Pancreas: No definite pancreatic mass. No pancreatic ductal dilatation. No pancreatic or peripancreatic fluid or inflammatory changes. Spleen: Unremarkable. Adrenals/Urinary Tract: Bilateral kidneys and bilateral adrenal glands are normal in appearance. No hydroureteronephrosis. Urinary bladder is normal in appearance. Stomach/Bowel: Normal appearance of the stomach. No pathologic dilatation of small bowel or colon. Normal appendix. Vascular/Lymphatic: No significant atherosclerotic disease, aneurysm or dissection identified in the abdominal or pelvic vasculature. Dilated left gonadal vein. No lymphadenopathy noted in the abdomen or pelvis. Reproductive: Uterus and ovaries are unremarkable in appearance. Other: No significant volume of ascites.  No pneumoperitoneum. Musculoskeletal: There are no aggressive appearing lytic or blastic lesions noted in the visualized portions of the skeleton. IMPRESSION: 1. New biliary tract obstruction  which appears to be related to some intermediate to high attenuation material in the distal common bile duct at or immediately before the level of the ampulla. Whether or not this reflects retained ductal stones, sludge balls or small ampullary neoplasm is uncertain on today's examination, and further evaluation with MRI of the abdomen with and without IV gadolinium with MRCP is suggested. 2. No findings to suggest recurrent lymphoma in the chest, abdomen or pelvis. Evidence of treated lymphoma in the  anterior mediastinum is unchanged. 3. Additional incidental findings, as above. These results will be called to the ordering clinician or representative by the Radiologist Assistant, and communication documented in the PACS or zVision Dashboard. Electronically Signed   By: Vinnie Langton M.D.   On: 04/04/2016 18:47   Mr 3d Recon At Scanner  Result Date: 04/05/2016 CLINICAL DATA:  Abdominal pain for 2 months. Previous cholecystectomy. Evaluate for choledocholithiasis. EXAM: MRI ABDOMEN WITHOUT AND WITH CONTRAST (INCLUDING MRCP) TECHNIQUE: Multiplanar multisequence MR imaging of the abdomen was performed both before and after the administration of intravenous contrast. Heavily T2-weighted images of the biliary and pancreatic ducts were obtained, and three-dimensional MRCP images were rendered by post processing. CONTRAST:  52m MULTIHANCE GADOBENATE DIMEGLUMINE 529 MG/ML IV SOLN COMPARISON:  01/11/2016 FINDINGS: Lower chest: No acute findings. Hepatobiliary: No mass or other parenchymal abnormality identified. Increase caliber of the common bile duct which measures 1 cm in diameter. Previously 6 mm. Mild intrahepatic biliary dilatation identified. Within the distal common bile duct there is a stone measuring 8 mm. Pancreas: No mass, inflammatory changes, or other parenchymal abnormality identified. Spleen:  Within normal limits in size and appearance. Adrenals/Urinary Tract: The adrenal glands are normal. No  suspicious kidney abnormality. No mass or hydronephrosis. Stomach/Bowel: The stomach appears normal. No abnormal dilatation of the upper abdominal bowel loops . Vascular/Lymphatic: The abdominal aorta appears normal. No adenopathy identified. Other:  None. Musculoskeletal: No suspicious bone lesions identified. IMPRESSION: 1. Stone within the distal common bile duct measures 8 mm. There is a progressive increase caliber of the common bile duct with intrahepatic biliary dilatation identified. 2. Previous cholecystectomy. These results will be called to the ordering clinician or representative by the Radiologist Assistant, and communication documented in the PACS or zVision Dashboard. Electronically Signed   By: TKerby MoorsM.D.   On: 04/05/2016 10:10   Mr Abdomen Mrcp WMoise BoringContast  Result Date: 04/05/2016 CLINICAL DATA:  Abdominal pain for 2 months. Previous cholecystectomy. Evaluate for choledocholithiasis. EXAM: MRI ABDOMEN WITHOUT AND WITH CONTRAST (INCLUDING MRCP) TECHNIQUE: Multiplanar multisequence MR imaging of the abdomen was performed both before and after the administration of intravenous contrast. Heavily T2-weighted images of the biliary and pancreatic ducts were obtained, and three-dimensional MRCP images were rendered by post processing. CONTRAST:  221mMULTIHANCE GADOBENATE DIMEGLUMINE 529 MG/ML IV SOLN COMPARISON:  01/11/2016 FINDINGS: Lower chest: No acute findings. Hepatobiliary: No mass or other parenchymal abnormality identified. Increase caliber of the common bile duct which measures 1 cm in diameter. Previously 6 mm. Mild intrahepatic biliary dilatation identified. Within the distal common bile duct there is a stone measuring 8 mm. Pancreas: No mass, inflammatory changes, or other parenchymal abnormality identified. Spleen:  Within normal limits in size and appearance. Adrenals/Urinary Tract: The adrenal glands are normal. No suspicious kidney abnormality. No mass or hydronephrosis.  Stomach/Bowel: The stomach appears normal. No abnormal dilatation of the upper abdominal bowel loops . Vascular/Lymphatic: The abdominal aorta appears normal. No adenopathy identified. Other:  None. Musculoskeletal: No suspicious bone lesions identified. IMPRESSION: 1. Stone within the distal common bile duct measures 8 mm. There is a progressive increase caliber of the common bile duct with intrahepatic biliary dilatation identified. 2. Previous cholecystectomy. These results will be called to the ordering clinician or representative by the Radiologist Assistant, and communication documented in the PACS or zVision Dashboard. Electronically Signed   By: TaKerby Moors.D.   On: 04/05/2016 10:10    Procedures Procedures (including critical care time)  Medications  Ordered in ED Medications - No data to display   Initial Impression / Assessment and Plan / ED Course  I have reviewed the triage vital signs and the nursing notes.  Pertinent labs & imaging results that were available during my care of the patient were reviewed by me and considered in my medical decision making (see chart for details).  Clinical Course    [name] [name] is a 45 y.o. female With a past medical history significant for Hodgkin filament, hypertension, CHF, and prior cholelithiasis who presents from outpatient radiology area for abdominal pain.  History and exam are seen above.  Patient exam significant for severe abdominal tenderness and epigastric area.  Patient had laboratory testing ordered and pain medication order. At the request of gastroenterology, patient be admitted to hospitalist service for further management. Patient will be made in PO at midnight in order to receive imaging studies tomorrow.  Patient continue to have abdominal pain and was admitted in stable condition.    Final Clinical Impressions(s) / ED Diagnoses   Final diagnoses:  Abdominal pain     Clinical Impression: 1. Abdominal  pain   2. Choledocholithiasis     Disposition: Admit to Hospitalist service    Courtney Paris, MD 04/05/16 [phone]

## 2016-04-04 NOTE — ED Notes (Signed)
Pt up to the restroom

## 2016-04-04 NOTE — Telephone Encounter (Signed)
Pt is set up for 5:30 and she is aware to be there at 3:30 to drink the contrast.

## 2016-04-04 NOTE — H&P (Signed)
History and Physical    [name] [name] PPJ:[id number] DOB: [birth year] DOA: 04/04/2016  PCP: [name], MD Consultants:  Oneida Alar - GI; [place] - Onc Patient coming from: home - lives with husband  Chief Complaint: abdominal pain  HPI: [name] [name] is a 45 y.o. female with medical history significant of lymphoma and hypothyroidism.  She had a cholecystectomy in 2015 and was admitted from 8/1-4/17 for recurrent stones found on MRCP and extraction with ERCP. On Sunday, yesterday and today, she has had recurrence of the same smpytoms - extreme and excruciating pain in midepigastric region.  No nausea/vomiting.  Normal BMs.  Had a CT today -pain was present at start but the pain increased throughout the test, possibly from contrast material.  Pain was intolerable.   The CT was abnormal and she was sent by Dr. Oneida Alar to the ER for admission.    ED Course:  Dilaudid for pain with marked improvement, labs ordered  Review of Systems: As per HPI; otherwise 10 point review of systems reviewed and negative.   Ambulatory Status:  Ambulates without assistance  Past Medical History:  Diagnosis Date  . Anemia    As a child and while pregnant  . 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 Depression    parents died close together "was hard"  . Dyspnea    all the time .".due to the mass"  . Eczema   . Fibromyalgia   . Headache(784.0)    Birth control pills help  . Hemorrhoid   . History of radiation therapy 10/28/12-11/20/12   chest & neck,34Gy/23f  . Hypertension    recently dx with hodgkins  . Restless leg   . S/P cholecystectomy 09/14/2013   08/29/2013 by Dr. JArnoldo Morale  Negative pathology  . Sleep apnea    Sleep Study- 1996- "little apnea- no tx"; recent study showed the same in 2017    Past Surgical History:  Procedure Laterality Date  . BONE MARROW BIOPSY  03/20/2012  . CARDIOVASCULAR STRESS TEST  11/2015  . CHOLECYSTECTOMY N/A 08/29/2013   Procedure: LAPAROSCOPIC CHOLECYSTECTOMY;  Surgeon: MJamesetta So MD;  Location: [facility];  Service: General;  Laterality: N/A;  . DILATION AND CURETTAGE OF UTERUS  1991  . ERCP N/A 01/12/2016   Procedure: ENDOSCOPIC RETROGRADE CHOLANGIOPANCREATOGRAPHY (ERCP);  Surgeon: SDanie Binder MD;  Location: [facility];  Service: Endoscopy;  Laterality: N/A;  . Mediastinal Mass Biopsy  03/05/12  . PORT-A-CATH REMOVAL Left 10/16/2012   Procedure: REMOVAL PORT-A-CATH;  Surgeon: SMelrose Nakayama MD;  Location: [facility]  Service: Thoracic;  Laterality: Left;  . PORTACATH PLACEMENT  03/26/2012   Procedure: INSERTION PORT-A-CATH;  Surgeon: SMelrose Nakayama MD;  [facility]  Service: Thoracic;  Laterality: N/A;  . REMOVAL OF STONES N/A 01/12/2016   Procedure: REMOVAL OF STONES;  Surgeon: SDanie Binder MD;  [facility]: [facility];  Service: Endoscopy;  Laterality: N/A;  . SPHINCTEROTOMY N/A 01/12/2016   Procedure: SPHINCTEROTOMY;  Surgeon: SDanie Binder MD;  Location: [facility];  Service: Endoscopy;  Laterality: N/A;  . WISDOM TOOTH EXTRACTION      Social History   Social History  . Marital status: Married    Spouse name: N/A  . Number of children: N/A  . Years of education: N/A   Occupational History  . product developer    Social History Main Topics  . Smoking status: Never Smoker  . Smokeless tobacco: Never  Used  . Alcohol use 0.0 oz/week    0 drink(s) per week     Comment: occasionaly 2 x month; rarely  . Drug use: No  . Sexual activity: Yes    Birth control/ protection: Pill   Other Topics Concern  . 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     Allergies  Allergen Reactions  . Other Other (See Comments)    Patient can not have straight oxygen due to reaction with chemo medications.   . Sulfur Anaphylaxis    Family History  Problem Relation Age of Onset  . Cancer Paternal Uncle   .  Cancer Maternal Grandmother     lung ca mets to throat    Prior to Admission medications   Medication Sig Start Date End Date Taking? Authorizing Provider  B Complex-C-E-Zn (B COMPLEX-C-E-ZINC) tablet Take 1 tablet by mouth daily. Reported on 09/03/2015    Historical Provider, MD  cholecalciferol (VITAMIN D) 1000 UNITS tablet Take 1,000 Units by mouth daily. Reported on 09/03/2015    Historical Provider, MD  Cyanocobalamin (VITAMIN B-12 PO) Take 1 capsule by mouth daily.    Historical Provider, MD  cyclobenzaprine (FLEXERIL) 10 MG tablet Take 10 mg by mouth at bedtime.    Historical Provider, MD  hydrochlorothiazide (HYDRODIURIL) 12.5 MG tablet Take 1 tablet (12.5 mg total) by mouth daily. Patient not taking: Reported on 02/25/2016 12/16/15   Jolaine Artist, MD  HYDROcodone-acetaminophen (NORCO/VICODIN) 5-325 MG tablet Take 2 tablets by mouth every 4 (four) hours as needed. Patient not taking: Reported on 02/25/2016 01/10/16   Noemi Chapel, MD  Ibuprofen-Diphenhydramine Cit (ADVIL PM PO) Take 2 tablets by mouth at bedtime as needed (pain/sleep).     Historical Provider, MD  JUNEL FE 1/20 1-20 MG-MCG tablet Take 1 tablet by mouth daily. 03/13/14   Historical Provider, MD  KRILL OIL PO Take 1 capsule by mouth daily.    Historical Provider, MD  levothyroxine (SYNTHROID, LEVOTHROID) 125 MCG tablet Take 125 mcg by mouth daily before breakfast.    Historical Provider, MD  MAGNESIUM PO Take 450 mg by mouth daily.    Historical Provider, MD  Multiple Vitamins-Minerals (ANTIOXIDANT FORMULA) CAPS Take 1 capsule by mouth daily. Reported on 09/03/2015    Historical Provider, MD  Multiple Vitamins-Minerals (HAIR/SKIN/NAILS/BIOTIN PO) Take 1 capsule by mouth daily. Reported on 09/03/2015    Historical Provider, MD  piroxicam (FELDENE) 20 MG capsule Take 1 capsule by mouth daily as needed (plantar facitis.).  12/20/13   Historical Provider, MD  promethazine (PHENERGAN) 12.5 MG tablet 1-2 PO 30 MINS PRIOR TO MEALS UP  TO THREE TIMES A DAY. DOSES SHOULD BE 4 HRS APART. Patient not taking: Reported on 02/25/2016 01/11/16   Danie Binder, MD  temazepam (RESTORIL) 15 MG capsule Take 15 mg by mouth at bedtime.     Historical Provider, MD  tiZANidine (ZANAFLEX) 4 MG tablet Take 4 mg by mouth at bedtime. 12/25/12   Baird Cancer, PA-C    Physical Exam: Vitals:   04/04/16 1942 04/04/16 1948 04/04/16 2011  BP:  158/89 (!) 156/106  Pulse:  80 99  Resp:  (!) 55 (!) 46  Temp:  97.6 F (36.4 C)   TempSrc:  Oral   SpO2:  98% 100%  Weight: 96.6 kg (213 lb)    Height: 5' 5.5" (1.664 m)       General: Appears calm and  comfortable and is NAD Eyes:  PERRL, EOMI, normal lids, iris ENT:  grossly normal hearing, lips & tongue, mmm Neck:  no LAD, masses or thyromegaly Cardiovascular:  RRR, no m/r/g. No LE edema.  Respiratory:  CTA bilaterally, no w/r/r. Normal respiratory effort. Abdomen:  soft, ntnd, NABS Skin:  no rash or induration seen on limited exam Musculoskeletal:  grossly normal tone BUE/BLE, good ROM, no bony abnormality Psychiatric:  grossly normal mood and affect, speech fluent and appropriate, AOx3 Neurologic:  CN 2-12 grossly intact, moves all extremities in coordinated fashion, sensation intact  Labs on Admission: I have personally reviewed following labs and imaging studies  CBC:  Recent Labs Lab 04/04/16 2017  WBC 8.8  NEUTROABS 7.4  HGB 15.2*  HCT 41.9  MCV 91.3  PLT 253   Basic Metabolic Panel:  Recent Labs Lab 04/04/16 2017  NA 134*  K 3.1*  CL 101  CO2 21*  GLUCOSE 119*  BUN 10  CREATININE 0.89  CALCIUM 9.6   GFR: Estimated Creatinine Clearance: 91.8 mL/min (by C-G formula based on SCr of 0.89 mg/dL). Liver Function Tests:  Recent Labs Lab 04/03/16 1613 04/04/16 2017  AST 50* 93*  ALT 50* 100*  ALKPHOS 83 133*  BILITOT 1.0 2.1*  PROT 6.6 8.2*  ALBUMIN 4.1 4.5    Recent Labs Lab 04/03/16 1613 04/04/16 2017  LIPASE 25 24    Recent Labs Lab  04/04/16 2017  AMMONIA 13   Coagulation Profile:  Recent Labs Lab 04/04/16 2017  INR 0.92   Cardiac Enzymes: No results for input(s): CKTOTAL, CKMB, CKMBINDEX, TROPONINI in the last 168 hours. BNP (last 3 results) No results for input(s): PROBNP in the last 8760 hours. HbA1C: No results for input(s): HGBA1C in the last 72 hours. CBG: No results for input(s): GLUCAP in the last 168 hours. Lipid Profile: No results for input(s): CHOL, HDL, LDLCALC, TRIG, CHOLHDL, LDLDIRECT in the last 72 hours. Thyroid Function Tests: No results for input(s): TSH, T4TOTAL, FREET4, T3FREE, THYROIDAB in the last 72 hours. Anemia Panel: No results for input(s): VITAMINB12, FOLATE, FERRITIN, TIBC, IRON, RETICCTPCT in the last 72 hours. Urine analysis:    Component Value Date/Time   COLORURINE AMBER (A) 01/11/2016 1335   APPEARANCEUR CLEAR 01/11/2016 1335   LABSPEC >1.030 (H) 01/11/2016 1335   PHURINE 5.5 01/11/2016 1335   GLUCOSEU 100 (A) 01/11/2016 1335   HGBUR MODERATE (A) 01/11/2016 1335   BILIRUBINUR LARGE (A) 01/11/2016 1335   KETONESUR >80 (A) 01/11/2016 1335   PROTEINUR 30 (A) 01/11/2016 1335   UROBILINOGEN 0.2 09/27/2012 1249   NITRITE NEGATIVE 01/11/2016 1335   LEUKOCYTESUR NEGATIVE 01/11/2016 1335    Creatinine Clearance: Estimated Creatinine Clearance: 91.8 mL/min (by C-G formula based on SCr of 0.89 mg/dL).  Sepsis Labs: _0 (procalcitonin:4,lacticidven:4) )No results found for this or any previous visit (from the past 240 hour(s)).   Radiological Exams on Admission: Ct Chest W Contrast  Result Date: 04/04/2016 CLINICAL DATA:  45 year old female with history of severe right upper quadrant abdominal pain for the past 3 days with nausea. History of lymphoma treated in 2014. EXAM: CT CHEST, ABDOMEN, AND PELVIS WITH CONTRAST TECHNIQUE: Multidetector CT imaging of the chest, abdomen and pelvis was performed following the standard protocol during bolus administration of  intravenous contrast. CONTRAST:  138m ISOVUE-300 IOPAMIDOL (ISOVUE-300) INJECTION 61% COMPARISON:  CT the abdomen and pelvis 01/10/2016. Chest CT 08/26/2015. FINDINGS: CT CHEST FINDINGS Cardiovascular: Heart size is normal. There is no significant pericardial fluid, thickening or pericardial calcification.  Mediastinum/Nodes: Previously demonstrated small amount of sessile soft tissue adjacent to the proximal aortic arch in the anterior mediastinum is slightly smaller compared to the prior examination measuring only 2.1 x 0.9 cm on today's study (axial image 20 of series 14), again most compatible with treated lymphoma. No other pathologically enlarged mediastinal or hilar lymph nodes. Esophagus is normal in appearance. No axillary lymphadenopathy. Lungs/Pleura: No acute consolidative airspace disease. No pleural effusions. No suspicious appearing pulmonary nodules or masses. Musculoskeletal: There are no aggressive appearing lytic or blastic lesions noted in the visualized portions of the skeleton. CT ABDOMEN PELVIS FINDINGS Hepatobiliary: No discrete cystic or solid hepatic lesions. Status post cholecystectomy. Compared to the prior examinations there is new moderate intrahepatic biliary ductal dilatation. Severe extrahepatic biliary ductal dilatation, with the common bile duct measuring up to 14 mm in the porta hepatis. Notably, immediately before the ampulla there is some intermediate to high attenuation material in the distal common bile duct, which could represent retained ductal stones, sludge balls, or soft tissue in the setting of an ampullary neoplasm. Pancreas: No definite pancreatic mass. No pancreatic ductal dilatation. No pancreatic or peripancreatic fluid or inflammatory changes. Spleen: Unremarkable. Adrenals/Urinary Tract: Bilateral kidneys and bilateral adrenal glands are normal in appearance. No hydroureteronephrosis. Urinary bladder is normal in appearance. Stomach/Bowel: Normal appearance of the  stomach. No pathologic dilatation of small bowel or colon. Normal appendix. Vascular/Lymphatic: No significant atherosclerotic disease, aneurysm or dissection identified in the abdominal or pelvic vasculature. Dilated left gonadal vein. No lymphadenopathy noted in the abdomen or pelvis. Reproductive: Uterus and ovaries are unremarkable in appearance. Other: No significant volume of ascites.  No pneumoperitoneum. Musculoskeletal: There are no aggressive appearing lytic or blastic lesions noted in the visualized portions of the skeleton. IMPRESSION: 1. New biliary tract obstruction which appears to be related to some intermediate to high attenuation material in the distal common bile duct at or immediately before the level of the ampulla. Whether or not this reflects retained ductal stones, sludge balls or small ampullary neoplasm is uncertain on today's examination, and further evaluation with MRI of the abdomen with and without IV gadolinium with MRCP is suggested. 2. No findings to suggest recurrent lymphoma in the chest, abdomen or pelvis. Evidence of treated lymphoma in the anterior mediastinum is unchanged. 3. Additional incidental findings, as above. These results will be called to the ordering clinician or representative by the Radiologist Assistant, and communication documented in the PACS or zVision Dashboard. Electronically Signed   By: Vinnie Langton M.D.   On: 04/04/2016 18:47   Ct Abdomen Pelvis W Contrast  Result Date: 04/04/2016 CLINICAL DATA:  46 year old female with history of severe right upper quadrant abdominal pain for the past 3 days with nausea. History of lymphoma treated in 2014. EXAM: CT CHEST, ABDOMEN, AND PELVIS WITH CONTRAST TECHNIQUE: Multidetector CT imaging of the chest, abdomen and pelvis was performed following the standard protocol during bolus administration of intravenous contrast. CONTRAST:  132m ISOVUE-300 IOPAMIDOL (ISOVUE-300) INJECTION 61% COMPARISON:  CT the abdomen  and pelvis 01/10/2016. Chest CT 08/26/2015. FINDINGS: CT CHEST FINDINGS Cardiovascular: Heart size is normal. There is no significant pericardial fluid, thickening or pericardial calcification. Mediastinum/Nodes: Previously demonstrated small amount of sessile soft tissue adjacent to the proximal aortic arch in the anterior mediastinum is slightly smaller compared to the prior examination measuring only 2.1 x 0.9 cm on today's study (axial image 20 of series 14), again most compatible with treated lymphoma. No other pathologically enlarged mediastinal or hilar lymph  nodes. Esophagus is normal in appearance. No axillary lymphadenopathy. Lungs/Pleura: No acute consolidative airspace disease. No pleural effusions. No suspicious appearing pulmonary nodules or masses. Musculoskeletal: There are no aggressive appearing lytic or blastic lesions noted in the visualized portions of the skeleton. CT ABDOMEN PELVIS FINDINGS Hepatobiliary: No discrete cystic or solid hepatic lesions. Status post cholecystectomy. Compared to the prior examinations there is new moderate intrahepatic biliary ductal dilatation. Severe extrahepatic biliary ductal dilatation, with the common bile duct measuring up to 14 mm in the porta hepatis. Notably, immediately before the ampulla there is some intermediate to high attenuation material in the distal common bile duct, which could represent retained ductal stones, sludge balls, or soft tissue in the setting of an ampullary neoplasm. Pancreas: No definite pancreatic mass. No pancreatic ductal dilatation. No pancreatic or peripancreatic fluid or inflammatory changes. Spleen: Unremarkable. Adrenals/Urinary Tract: Bilateral kidneys and bilateral adrenal glands are normal in appearance. No hydroureteronephrosis. Urinary bladder is normal in appearance. Stomach/Bowel: Normal appearance of the stomach. No pathologic dilatation of small bowel or colon. Normal appendix. Vascular/Lymphatic: No significant  atherosclerotic disease, aneurysm or dissection identified in the abdominal or pelvic vasculature. Dilated left gonadal vein. No lymphadenopathy noted in the abdomen or pelvis. Reproductive: Uterus and ovaries are unremarkable in appearance. Other: No significant volume of ascites.  No pneumoperitoneum. Musculoskeletal: There are no aggressive appearing lytic or blastic lesions noted in the visualized portions of the skeleton. IMPRESSION: 1. New biliary tract obstruction which appears to be related to some intermediate to high attenuation material in the distal common bile duct at or immediately before the level of the ampulla. Whether or not this reflects retained ductal stones, sludge balls or small ampullary neoplasm is uncertain on today's examination, and further evaluation with MRI of the abdomen with and without IV gadolinium with MRCP is suggested. 2. No findings to suggest recurrent lymphoma in the chest, abdomen or pelvis. Evidence of treated lymphoma in the anterior mediastinum is unchanged. 3. Additional incidental findings, as above. These results will be called to the ordering clinician or representative by the Radiologist Assistant, and communication documented in the PACS or zVision Dashboard. Electronically Signed   By: Vinnie Langton M.D.   On: 04/04/2016 18:47    EKG: Not done  Assessment/Plan Principal Problem:   Abdominal pain Active Problems:   Hypertension   Hodgkin's lymphoma (HCC)   Hypokalemia   Elevated LFTs   Hyponatremia   Abdominal pain -Patient is s/p cholecystectomy (2015) and ERCP/sphincterotomy (Aug 2017) now with biliary tract obstruction as evidenced by hyperbilirubinemia, elevated LFTs, and abnormal CT -Most likely retained stones/sludge, but given h/o lymphoma this is concerning too -Will order overnight MRI with/without contrast with MRCP for further information -Dr. Oneida Alar to see tomorrow, patient is likely to need repeat ERCP -Pain control with  morphine -IVF for hyponatremia, elevated lactate (suspect mild volume deficiency); trend lactate  HTN -Continue HCTZ (patient is not taking at home, but it will be continued here)  Lymphoma -No evidence of recurrent disease at this time  Hypokalemia -Replete and follow  Hypothyroidism -Normal TSH in August -Continue Synthroid at current dose  DVT prophylaxis: Lovenox  Code Status: Full - confirmed with patient/family Family Communication: Husband present throughout evaluation Disposition Plan:  Home once clinically improved Consults called: GI, Dr. Oneida Alar  Admission status: It is my clinical opinion that referral for OBSERVATION is reasonable and necessary in this patient based on the above information provided. The aforementioned taken together are felt to place the patient  at high risk for further clinical deterioration. However it is anticipated that the patient may be medically stable for discharge from the hospital within 24 to 48 hours.     Karmen Bongo MD [name]  If 7PM-7AM, please contact night-coverage [email or web address] Password [email or web address]  04/04/2016, 9:19 PM

## 2016-04-05 ENCOUNTER — Observation Stay (HOSPITAL_COMMUNITY): Payer: Commercial Managed Care - PPO

## 2016-04-05 ENCOUNTER — Encounter (HOSPITAL_COMMUNITY)
Admission: EM | Disposition: A | Discharge status: Home / Self Care | Payer: Self-pay | Source: Home / Self Care | Attending: Internal Medicine

## 2016-04-05 ENCOUNTER — Observation Stay (HOSPITAL_COMMUNITY): Payer: Commercial Managed Care - PPO | Admitting: Anesthesiology

## 2016-04-05 ENCOUNTER — Other Ambulatory Visit (HOSPITAL_COMMUNITY): Payer: Self-pay | Admitting: Oncology

## 2016-04-05 ENCOUNTER — Encounter (HOSPITAL_COMMUNITY): Payer: Self-pay | Admitting: *Deleted

## 2016-04-05 ENCOUNTER — Ambulatory Visit (HOSPITAL_COMMUNITY): Admit: 2016-04-05 | Payer: Commercial Managed Care - PPO | Admitting: Internal Medicine

## 2016-04-05 DIAGNOSIS — K8050 Calculus of bile duct without cholangitis or cholecystitis without obstruction: Secondary | ICD-10-CM | POA: Diagnosis not present

## 2016-04-05 DIAGNOSIS — R932 Abnormal findings on diagnostic imaging of liver and biliary tract: Secondary | ICD-10-CM

## 2016-04-05 DIAGNOSIS — E876 Hypokalemia: Secondary | ICD-10-CM

## 2016-04-05 DIAGNOSIS — R1013 Epigastric pain: Secondary | ICD-10-CM | POA: Diagnosis not present

## 2016-04-05 DIAGNOSIS — E871 Hypo-osmolality and hyponatremia: Secondary | ICD-10-CM

## 2016-04-05 DIAGNOSIS — R1011 Right upper quadrant pain: Secondary | ICD-10-CM | POA: Diagnosis not present

## 2016-04-05 DIAGNOSIS — R7989 Other specified abnormal findings of blood chemistry: Secondary | ICD-10-CM | POA: Diagnosis not present

## 2016-04-05 DIAGNOSIS — I10 Essential (primary) hypertension: Secondary | ICD-10-CM | POA: Diagnosis not present

## 2016-04-05 DIAGNOSIS — C8110 Nodular sclerosis classical Hodgkin lymphoma, unspecified site: Secondary | ICD-10-CM | POA: Diagnosis not present

## 2016-04-05 HISTORY — PX: ERCP: SHX5425

## 2016-04-05 HISTORY — DX: Calculus of bile duct without cholangitis or cholecystitis without obstruction: K80.50

## 2016-04-05 HISTORY — PX: SPHINCTEROTOMY: SHX5544

## 2016-04-05 HISTORY — PX: PANCREATIC STENT PLACEMENT: SHX5539

## 2016-04-05 LAB — CBC
HCT: 34.1 % — ABNORMAL LOW (ref 36.0–46.0)
Hemoglobin: 11.9 g/dL — ABNORMAL LOW (ref 12.0–15.0)
MCH: 32.1 pg (ref 26.0–34.0)
MCHC: 34.9 g/dL (ref 30.0–36.0)
MCV: 91.9 fL (ref 78.0–100.0)
Platelets: 178 10*3/uL (ref 150–400)
RBC: 3.71 MIL/uL — ABNORMAL LOW (ref 3.87–5.11)
RDW: 12.2 % (ref 11.5–15.5)
WBC: 6.8 10*3/uL (ref 4.0–10.5)

## 2016-04-05 LAB — BASIC METABOLIC PANEL
Anion gap: 5 (ref 5–15)
BUN: 7 mg/dL (ref 6–20)
CO2: 25 mmol/L (ref 22–32)
Calcium: 8.1 mg/dL — ABNORMAL LOW (ref 8.9–10.3)
Chloride: 106 mmol/L (ref 101–111)
Creatinine, Ser: 0.75 mg/dL (ref 0.44–1.00)
GFR calc Af Amer: >60 mL/min (ref >60)
GFR calc non Af Amer: >60 mL/min (ref >60)
Glucose, Bld: 98 mg/dL (ref 65–99)
Potassium: 3.8 mmol/L (ref 3.5–5.1)
Sodium: 136 mmol/L (ref 135–145)

## 2016-04-05 LAB — HEPATIC FUNCTION PANEL
ALT: 95 U/L — ABNORMAL HIGH (ref 14–54)
AST: 92 U/L — ABNORMAL HIGH (ref 15–41)
Albumin: 3.2 g/dL — ABNORMAL LOW (ref 3.5–5.0)
Alkaline Phosphatase: 98 U/L (ref 38–126)
Bilirubin, Direct: 1.5 mg/dL — ABNORMAL HIGH (ref 0.1–0.5)
Indirect Bilirubin: 0.9 mg/dL (ref 0.3–0.9)
Total Bilirubin: 2.4 mg/dL — ABNORMAL HIGH (ref 0.3–1.2)
Total Protein: 5.7 g/dL — ABNORMAL LOW (ref 6.5–8.1)

## 2016-04-05 LAB — LIPASE, BLOOD: Lipase: 19 U/L (ref 11–51)

## 2016-04-05 LAB — MRSA PCR SCREENING: MRSA by PCR: NEGATIVE

## 2016-04-05 SURGERY — ERCP, WITH INTERVENTION IF INDICATED
Anesthesia: General

## 2016-04-05 SURGERY — ENDOSCOPIC RETROGRADE CHOLANGIOPANCREATOGRAPHY (ERCP) WITH PROPOFOL
Anesthesia: General

## 2016-04-05 MED ORDER — LACTATED RINGERS IV SOLN: INTRAVENOUS | Status: DC

## 2016-04-05 MED ORDER — ROCURONIUM 10MG/ML (10ML) SYRINGE FOR MEDFUSION PUMP - OPTIME
INTRAVENOUS | Status: DC | PRN
  Administered 2016-04-05: 50 mg via INTRAVENOUS

## 2016-04-05 MED ORDER — NEOSTIGMINE METHYLSULFATE 10 MG/10ML IV SOLN
INTRAVENOUS | Status: DC | PRN
  Administered 2016-04-05: 4 mg via INTRAVENOUS

## 2016-04-05 MED ORDER — PROPOFOL 10 MG/ML IV BOLUS
INTRAVENOUS | Status: AC
  Filled 2016-04-05: qty 20

## 2016-04-05 MED ORDER — IOPAMIDOL (ISOVUE-300) INJECTION 61%
INTRAVENOUS | Status: DC | PRN
  Administered 2016-04-05: 50 mL

## 2016-04-05 MED ORDER — ONDANSETRON HCL 4 MG/2ML IJ SOLN
INTRAMUSCULAR | Status: AC
  Filled 2016-04-05: qty 2

## 2016-04-05 MED ORDER — FENTANYL CITRATE (PF) 100 MCG/2ML IJ SOLN
INTRAMUSCULAR | Status: DC | PRN
  Administered 2016-04-05 (×2): 50 ug via INTRAVENOUS

## 2016-04-05 MED ORDER — MIDAZOLAM HCL 2 MG/2ML IJ SOLN
1.0000 mg | INTRAMUSCULAR | Status: DC | PRN
  Administered 2016-04-05 (×2): 2 mg via INTRAVENOUS
  Filled 2016-04-05: qty 2

## 2016-04-05 MED ORDER — GADOBENATE DIMEGLUMINE 529 MG/ML IV SOLN
20.0000 mL | Freq: Once | INTRAVENOUS | Status: AC | PRN
  Administered 2016-04-05: 20 mL via INTRAVENOUS

## 2016-04-05 MED ORDER — FENTANYL CITRATE (PF) 250 MCG/5ML IJ SOLN
INTRAMUSCULAR | Status: AC
  Filled 2016-04-05: qty 5

## 2016-04-05 MED ORDER — GLUCAGON HCL RDNA (DIAGNOSTIC) 1 MG IJ SOLR
INTRAMUSCULAR | Status: AC
  Filled 2016-04-05: qty 1

## 2016-04-05 MED ORDER — LIDOCAINE HCL (PF) 1 % IJ SOLN
INTRAMUSCULAR | Status: AC
  Filled 2016-04-05: qty 5

## 2016-04-05 MED ORDER — STERILE WATER FOR IRRIGATION IR SOLN
Status: DC | PRN
  Administered 2016-04-05: 1000 mL

## 2016-04-05 MED ORDER — IOPAMIDOL (ISOVUE-300) INJECTION 61%
INTRAVENOUS | Status: AC
  Filled 2016-04-05: qty 100

## 2016-04-05 MED ORDER — DEXTROSE 5 % IV SOLN
2.0000 g | Freq: Once | INTRAVENOUS | Status: AC
  Administered 2016-04-05: 2 g via INTRAVENOUS
  Filled 2016-04-05: qty 2

## 2016-04-05 MED ORDER — HYDROMORPHONE HCL 1 MG/ML IJ SOLN: 0.2500 mg | INTRAMUSCULAR | Status: DC | PRN

## 2016-04-05 MED ORDER — MIDAZOLAM HCL 2 MG/2ML IJ SOLN
INTRAMUSCULAR | Status: AC
  Filled 2016-04-05: qty 2

## 2016-04-05 MED ORDER — ROCURONIUM BROMIDE 50 MG/5ML IV SOLN
INTRAVENOUS | Status: AC
  Filled 2016-04-05: qty 1

## 2016-04-05 MED ORDER — PROPOFOL 10 MG/ML IV BOLUS
INTRAVENOUS | Status: DC | PRN
Start: 2016-04-05 — End: 2016-04-05
  Administered 2016-04-05: 150 mg via INTRAVENOUS

## 2016-04-05 MED ORDER — IOPAMIDOL (ISOVUE-300) INJECTION 61%
INTRAVENOUS | Status: AC
  Filled 2016-04-05: qty 50

## 2016-04-05 MED ORDER — GLYCOPYRROLATE 0.2 MG/ML IJ SOLN
0.2000 mg | Freq: Once | INTRAMUSCULAR | Status: AC
  Administered 2016-04-05: 0.2 mg via INTRAVENOUS

## 2016-04-05 MED ORDER — SUCCINYLCHOLINE CHLORIDE 20 MG/ML IJ SOLN
INTRAMUSCULAR | Status: AC
Start: 2016-04-05 — End: 2016-04-05
  Filled 2016-04-05: qty 1

## 2016-04-05 MED ORDER — ONDANSETRON HCL 4 MG/2ML IJ SOLN
4.0000 mg | Freq: Once | INTRAMUSCULAR | Status: AC
  Administered 2016-04-05: 4 mg via INTRAVENOUS

## 2016-04-05 MED ORDER — GLYCOPYRROLATE 0.2 MG/ML IJ SOLN
INTRAMUSCULAR | Status: DC | PRN
  Administered 2016-04-05: .6 mg via INTRAVENOUS

## 2016-04-05 MED ORDER — GLYCOPYRROLATE 0.2 MG/ML IJ SOLN
INTRAMUSCULAR | Status: AC
  Filled 2016-04-05: qty 1

## 2016-04-05 MED ORDER — LIDOCAINE HCL (CARDIAC) 10 MG/ML IV SOLN
INTRAVENOUS | Status: DC | PRN
  Administered 2016-04-05: 30 mg via INTRAVENOUS

## 2016-04-05 NOTE — Consult Note (Signed)
Reason for Consult:abdominal pain Referring Physician: Hospitallist  [name] [name] is an 45 y.o. female.  HPI: Admitted thru the ED with RUQ pain. She underwent a CT and then was referred to the ED.  Same pain as when she had ERCP in August for CBD stone.  She underwent an MRI which revealed :IMPRESSION: 1. Stone within the distal common bile duct measures 8 mm. There is a progressive increase caliber of the common bile duct with intrahepatic biliary dilatation identified. 2. Previous cholecystectomy.  In August she underwent an ERCP for bile duct stone by Dr. Oneida Alar. She is tired now. She has not had any pain medication since last night.  Rates pain at a 0 at this time.  She says as long as she doesn't eat, she will not have the pain.  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 Followed by the [facility] at [facility].   Hepatic Function Latest Ref Rng & Units 04/05/2016 04/04/2016 04/03/2016  Total Protein 6.5 - 8.1 g/dL 5.7(L) 8.2(H) 6.6  Albumin 3.5 - 5.0 g/dL 3.2(L) 4.5 4.1  AST 15 - 41 U/L 92(H) 93(H) 50(H)  ALT 14 - 54 U/L 95(H) 100(H) 50(H)  Alk Phosphatase 38 - 126 U/L 98 133(H) 83  Total Bilirubin 0.3 - 1.2 mg/dL 2.4(H) 2.1(H) 1.0  Bilirubin, Direct 0.1 - 0.5 mg/dL 1.5(H) - 0.5(H)   CBC Latest Ref Rng & Units 04/05/2016 04/04/2016 01/12/2016  WBC 4.0 - 10.5 K/uL 6.8 8.8 4.5  Hemoglobin 12.0 - 15.0 g/dL 11.9(L) 15.2(H) 11.7(L)  Hematocrit 36.0 - 46.0 % 34.1(L) 41.9 33.4(L)  Platelets 150 - 400 K/uL 178 219 190      Past Medical History:  Diagnosis Date  . Anemia    As a child and while pregnant  . 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 Depression    parents died close together "was hard"  . Dyspnea    all the time .".due to the mass"  . Eczema   . Fibromyalgia   . Headache(784.0)    Birth control pills help  . Hemorrhoid   . History of radiation therapy 10/28/12-11/20/12   chest & neck,34Gy/50f  . Hypertension     recently dx with hodgkins  . Restless leg   . S/P cholecystectomy 09/14/2013   08/29/2013 by Dr. JArnoldo Morale  Negative pathology  . Sleep apnea    Sleep Study- 1996- "little apnea- no tx"; recent study showed the same in 2017    Past Surgical History:  Procedure Laterality Date  . BONE MARROW BIOPSY  03/20/2012  . CARDIOVASCULAR STRESS TEST  11/2015  . CHOLECYSTECTOMY N/A 08/29/2013   Procedure: LAPAROSCOPIC CHOLECYSTECTOMY;  Surgeon: MJamesetta So MD;  Location: [facility];  Service: General;  Laterality: N/A;  . DILATION AND CURETTAGE OF UTERUS  1991  . ERCP N/A 01/12/2016   Procedure: ENDOSCOPIC RETROGRADE CHOLANGIOPANCREATOGRAPHY (ERCP);  Surgeon: SDanie Binder MD;  Location: [facility];  Service: Endoscopy;  Laterality: N/A;  . Mediastinal Mass Biopsy  03/05/12  . PORT-A-CATH REMOVAL Left 10/16/2012   Procedure: REMOVAL PORT-A-CATH;  Surgeon: SMelrose Nakayama MD;  [place] [facility] [place]  Service: Thoracic;  Laterality: Left;  . PORTACATH PLACEMENT  03/26/2012   Procedure: INSERTION PORT-A-CATH;  Surgeon: SMelrose Nakayama MD;  [place]  Service: Thoracic;  Laterality: N/A;  . REMOVAL OF STONES N/A 01/12/2016   Procedure: REMOVAL OF STONES;  Surgeon: SDanie Binder MD;  Location:  [facility];  Service: Endoscopy;  Laterality: N/A;  . SPHINCTEROTOMY N/A 01/12/2016   Procedure: SPHINCTEROTOMY;  Surgeon: Danie Binder, MD;  Location: [facility];  Service: Endoscopy;  Laterality: N/A;  . WISDOM TOOTH EXTRACTION      Family History  Problem Relation Age of Onset  . Cancer Paternal Uncle   . Cancer Maternal Grandmother     lung ca mets to throat    Social History:  reports that she has never smoked. She has never used smokeless tobacco. She reports that she drinks alcohol. She reports that she does not use drugs.  Allergies:  Allergies  Allergen Reactions  . Other Other (See Comments)    Patient can not have straight oxygen due to reaction with chemo medications.   .  Sulfur Anaphylaxis    Medications: I have reviewed the patient's current medications.  Results for orders placed or performed during the hospital encounter of 04/04/16 (from the past 48 hour(s))  CBC with Differential     Status: Abnormal   Collection Time: 04/04/16  8:17 PM  Result Value Ref Range   WBC 8.8 4.0 - 10.5 K/uL   RBC 4.59 3.87 - 5.11 MIL/uL   Hemoglobin 15.2 (H) 12.0 - 15.0 g/dL   HCT 41.9 36.0 - 46.0 %   MCV 91.3 78.0 - 100.0 fL   MCH 33.1 26.0 - 34.0 pg   MCHC 36.3 (H) 30.0 - 36.0 g/dL   RDW 12.1 11.5 - 15.5 %   Platelets 219 150 - 400 K/uL   Neutrophils Relative % 84 %   Neutro Abs 7.4 1.7 - 7.7 K/uL   Lymphocytes Relative 10 %   Lymphs Abs 0.9 0.7 - 4.0 K/uL   Monocytes Relative 5 %   Monocytes Absolute 0.4 0.1 - 1.0 K/uL   Eosinophils Relative 1 %   Eosinophils Absolute 0.0 0.0 - 0.7 K/uL   Basophils Relative 0 %   Basophils Absolute 0.0 0.0 - 0.1 K/uL  Comprehensive metabolic panel     Status: Abnormal   Collection Time: 04/04/16  8:17 PM  Result Value Ref Range   Sodium 134 (L) 135 - 145 mmol/L   Potassium 3.1 (L) 3.5 - 5.1 mmol/L   Chloride 101 101 - 111 mmol/L   CO2 21 (L) 22 - 32 mmol/L   Glucose, Bld 119 (H) 65 - 99 mg/dL   BUN 10 6 - 20 mg/dL   Creatinine, Ser 0.89 0.44 - 1.00 mg/dL   Calcium 9.6 8.9 - 10.3 mg/dL   Total Protein 8.2 (H) 6.5 - 8.1 g/dL   Albumin 4.5 3.5 - 5.0 g/dL   AST 93 (H) 15 - 41 U/L   ALT 100 (H) 14 - 54 U/L   Alkaline Phosphatase 133 (H) 38 - 126 U/L   Total Bilirubin 2.1 (H) 0.3 - 1.2 mg/dL   GFR calc non Af Amer >60 >60 mL/min   GFR calc Af Amer >60 >60 mL/min    Comment: (NOTE) The eGFR has been calculated using the CKD EPI equation. This calculation has not been validated in all clinical situations. eGFR's persistently <60 mL/min signify possible Chronic Kidney Disease.    Anion gap 12 5 - 15  Lactic acid, plasma     Status: Abnormal   Collection Time: 04/04/16  8:17 PM  Result Value Ref Range   Lactic Acid,  Venous 2.3 (HH) 0.5 - 1.9 mmol/L    Comment: CRITICAL RESULT CALLED TO, READ BACK BY AND VERIFIED  WITH: HUTCHENS,L AT 2100 ON 10.24.2017 BY ISLEY,B   Lipase, blood     Status: None   Collection Time: 04/04/16  8:17 PM  Result Value Ref Range   Lipase 24 11 - 51 U/L  Protime-INR     Status: None   Collection Time: 04/04/16  8:17 PM  Result Value Ref Range   Prothrombin Time 12.3 11.4 - 15.2 seconds   INR 0.92   Ammonia     Status: None   Collection Time: 04/04/16  8:17 PM  Result Value Ref Range   Ammonia 13 9 - 35 umol/L  Lactic acid, plasma     Status: None   Collection Time: 04/04/16  9:36 PM  Result Value Ref Range   Lactic Acid, Venous 1.1 0.5 - 1.9 mmol/L  Urinalysis, Routine w reflex microscopic (not at [facility])     Status: Abnormal   Collection Time: 04/04/16 11:15 PM  Result Value Ref Range   Color, Urine YELLOW YELLOW   APPearance CLEAR CLEAR   Specific Gravity, Urine 1.010 1.005 - 1.030   pH 6.0 5.0 - 8.0   Glucose, UA NEGATIVE NEGATIVE mg/dL   Hgb urine dipstick MODERATE (A) NEGATIVE   Bilirubin Urine NEGATIVE NEGATIVE   Ketones, ur TRACE (A) NEGATIVE mg/dL   Protein, ur NEGATIVE NEGATIVE mg/dL   Nitrite NEGATIVE NEGATIVE   Leukocytes, UA NEGATIVE NEGATIVE  Urine microscopic-add on     Status: Abnormal   Collection Time: 04/04/16 11:15 PM  Result Value Ref Range   Squamous Epithelial / LPF 6-30 (A) NONE SEEN   WBC, UA 6-30 0 - 5 WBC/hpf   RBC / HPF 0-5 0 - 5 RBC/hpf   Bacteria, UA MANY (A) NONE SEEN  MRSA PCR Screening     Status: None   Collection Time: 04/04/16 11:45 PM  Result Value Ref Range   MRSA by PCR NEGATIVE NEGATIVE    Comment:        The GeneXpert MRSA Assay (FDA approved for NASAL specimens only), is one component of a comprehensive MRSA colonization surveillance program. It is not intended to diagnose MRSA infection nor to guide or monitor treatment for MRSA infections.   Basic metabolic panel     Status: Abnormal   Collection Time:  04/05/16  4:42 AM  Result Value Ref Range   Sodium 136 135 - 145 mmol/L   Potassium 3.8 3.5 - 5.1 mmol/L    Comment: DELTA CHECK NOTED   Chloride 106 101 - 111 mmol/L   CO2 25 22 - 32 mmol/L   Glucose, Bld 98 65 - 99 mg/dL   BUN 7 6 - 20 mg/dL   Creatinine, Ser 0.75 0.44 - 1.00 mg/dL   Calcium 8.1 (L) 8.9 - 10.3 mg/dL   GFR calc non Af Amer >60 >60 mL/min   GFR calc Af Amer >60 >60 mL/min    Comment: (NOTE) The eGFR has been calculated using the CKD EPI equation. This calculation has not been validated in all clinical situations. eGFR's persistently <60 mL/min signify possible Chronic Kidney Disease.    Anion gap 5 5 - 15  CBC     Status: Abnormal   Collection Time: 04/05/16  4:42 AM  Result Value Ref Range   WBC 6.8 4.0 - 10.5 K/uL   RBC 3.71 (L) 3.87 - 5.11 MIL/uL   Hemoglobin 11.9 (L) 12.0 - 15.0 g/dL    Comment: DELTA CHECK NOTED   HCT 34.1 (L) 36.0 - 46.0 %   MCV  91.9 78.0 - 100.0 fL   MCH 32.1 26.0 - 34.0 pg   MCHC 34.9 30.0 - 36.0 g/dL   RDW 12.2 11.5 - 15.5 %   Platelets 178 150 - 400 K/uL  Hepatic function panel     Status: Abnormal   Collection Time: 04/05/16  4:42 AM  Result Value Ref Range   Total Protein 5.7 (L) 6.5 - 8.1 g/dL   Albumin 3.2 (L) 3.5 - 5.0 g/dL   AST 92 (H) 15 - 41 U/L   ALT 95 (H) 14 - 54 U/L   Alkaline Phosphatase 98 38 - 126 U/L   Total Bilirubin 2.4 (H) 0.3 - 1.2 mg/dL   Bilirubin, Direct 1.5 (H) 0.1 - 0.5 mg/dL   Indirect Bilirubin 0.9 0.3 - 0.9 mg/dL  Lipase, blood     Status: None   Collection Time: 04/05/16  4:42 AM  Result Value Ref Range   Lipase 19 11 - 51 U/L    Ct Chest W Contrast  Result Date: 04/04/2016 CLINICAL DATA:  45 year old female with history of severe right upper quadrant abdominal pain for the past 3 days with nausea. History of lymphoma treated in 2014. EXAM: CT CHEST, ABDOMEN, AND PELVIS WITH CONTRAST TECHNIQUE: Multidetector CT imaging of the chest, abdomen and pelvis was performed following the standard  protocol during bolus administration of intravenous contrast. CONTRAST:  120m ISOVUE-300 IOPAMIDOL (ISOVUE-300) INJECTION 61% COMPARISON:  CT the abdomen and pelvis 01/10/2016. Chest CT 08/26/2015. FINDINGS: CT CHEST FINDINGS Cardiovascular: Heart size is normal. There is no significant pericardial fluid, thickening or pericardial calcification. Mediastinum/Nodes: Previously demonstrated small amount of sessile soft tissue adjacent to the proximal aortic arch in the anterior mediastinum is slightly smaller compared to the prior examination measuring only 2.1 x 0.9 cm on today's study (axial image 20 of series 14), again most compatible with treated lymphoma. No other pathologically enlarged mediastinal or hilar lymph nodes. Esophagus is normal in appearance. No axillary lymphadenopathy. Lungs/Pleura: No acute consolidative airspace disease. No pleural effusions. No suspicious appearing pulmonary nodules or masses. Musculoskeletal: There are no aggressive appearing lytic or blastic lesions noted in the visualized portions of the skeleton. CT ABDOMEN PELVIS FINDINGS Hepatobiliary: No discrete cystic or solid hepatic lesions. Status post cholecystectomy. Compared to the prior examinations there is new moderate intrahepatic biliary ductal dilatation. Severe extrahepatic biliary ductal dilatation, with the common bile duct measuring up to 14 mm in the porta hepatis. Notably, immediately before the ampulla there is some intermediate to high attenuation material in the distal common bile duct, which could represent retained ductal stones, sludge balls, or soft tissue in the setting of an ampullary neoplasm. Pancreas: No definite pancreatic mass. No pancreatic ductal dilatation. No pancreatic or peripancreatic fluid or inflammatory changes. Spleen: Unremarkable. Adrenals/Urinary Tract: Bilateral kidneys and bilateral adrenal glands are normal in appearance. No hydroureteronephrosis. Urinary bladder is normal in appearance.  Stomach/Bowel: Normal appearance of the stomach. No pathologic dilatation of small bowel or colon. Normal appendix. Vascular/Lymphatic: No significant atherosclerotic disease, aneurysm or dissection identified in the abdominal or pelvic vasculature. Dilated left gonadal vein. No lymphadenopathy noted in the abdomen or pelvis. Reproductive: Uterus and ovaries are unremarkable in appearance. Other: No significant volume of ascites.  No pneumoperitoneum. Musculoskeletal: There are no aggressive appearing lytic or blastic lesions noted in the visualized portions of the skeleton. IMPRESSION: 1. New biliary tract obstruction which appears to be related to some intermediate to high attenuation material in the distal common bile duct at or immediately  before the level of the ampulla. Whether or not this reflects retained ductal stones, sludge balls or small ampullary neoplasm is uncertain on today's examination, and further evaluation with MRI of the abdomen with and without IV gadolinium with MRCP is suggested. 2. No findings to suggest recurrent lymphoma in the chest, abdomen or pelvis. Evidence of treated lymphoma in the anterior mediastinum is unchanged. 3. Additional incidental findings, as above. These results will be called to the ordering clinician or representative by the Radiologist Assistant, and communication documented in the PACS or zVision Dashboard. Electronically Signed   By: Vinnie Langton M.D.   On: 04/04/2016 18:47   Ct Abdomen Pelvis W Contrast  Result Date: 04/04/2016 CLINICAL DATA:  45 year old female with history of severe right upper quadrant abdominal pain for the past 3 days with nausea. History of lymphoma treated in 2014. EXAM: CT CHEST, ABDOMEN, AND PELVIS WITH CONTRAST TECHNIQUE: Multidetector CT imaging of the chest, abdomen and pelvis was performed following the standard protocol during bolus administration of intravenous contrast. CONTRAST:  131m ISOVUE-300 IOPAMIDOL (ISOVUE-300)  INJECTION 61% COMPARISON:  CT the abdomen and pelvis 01/10/2016. Chest CT 08/26/2015. FINDINGS: CT CHEST FINDINGS Cardiovascular: Heart size is normal. There is no significant pericardial fluid, thickening or pericardial calcification. Mediastinum/Nodes: Previously demonstrated small amount of sessile soft tissue adjacent to the proximal aortic arch in the anterior mediastinum is slightly smaller compared to the prior examination measuring only 2.1 x 0.9 cm on today's study (axial image 20 of series 14), again most compatible with treated lymphoma. No other pathologically enlarged mediastinal or hilar lymph nodes. Esophagus is normal in appearance. No axillary lymphadenopathy. Lungs/Pleura: No acute consolidative airspace disease. No pleural effusions. No suspicious appearing pulmonary nodules or masses. Musculoskeletal: There are no aggressive appearing lytic or blastic lesions noted in the visualized portions of the skeleton. CT ABDOMEN PELVIS FINDINGS Hepatobiliary: No discrete cystic or solid hepatic lesions. Status post cholecystectomy. Compared to the prior examinations there is new moderate intrahepatic biliary ductal dilatation. Severe extrahepatic biliary ductal dilatation, with the common bile duct measuring up to 14 mm in the porta hepatis. Notably, immediately before the ampulla there is some intermediate to high attenuation material in the distal common bile duct, which could represent retained ductal stones, sludge balls, or soft tissue in the setting of an ampullary neoplasm. Pancreas: No definite pancreatic mass. No pancreatic ductal dilatation. No pancreatic or peripancreatic fluid or inflammatory changes. Spleen: Unremarkable. Adrenals/Urinary Tract: Bilateral kidneys and bilateral adrenal glands are normal in appearance. No hydroureteronephrosis. Urinary bladder is normal in appearance. Stomach/Bowel: Normal appearance of the stomach. No pathologic dilatation of small bowel or colon. Normal  appendix. Vascular/Lymphatic: No significant atherosclerotic disease, aneurysm or dissection identified in the abdominal or pelvic vasculature. Dilated left gonadal vein. No lymphadenopathy noted in the abdomen or pelvis. Reproductive: Uterus and ovaries are unremarkable in appearance. Other: No significant volume of ascites.  No pneumoperitoneum. Musculoskeletal: There are no aggressive appearing lytic or blastic lesions noted in the visualized portions of the skeleton. IMPRESSION: 1. New biliary tract obstruction which appears to be related to some intermediate to high attenuation material in the distal common bile duct at or immediately before the level of the ampulla. Whether or not this reflects retained ductal stones, sludge balls or small ampullary neoplasm is uncertain on today's examination, and further evaluation with MRI of the abdomen with and without IV gadolinium with MRCP is suggested. 2. No findings to suggest recurrent lymphoma in the chest, abdomen or pelvis. Evidence  of treated lymphoma in the anterior mediastinum is unchanged. 3. Additional incidental findings, as above. These results will be called to the ordering clinician or representative by the Radiologist Assistant, and communication documented in the PACS or zVision Dashboard. Electronically Signed   By: Vinnie Langton M.D.   On: 04/04/2016 18:47   Mr 3d Recon At Scanner  Result Date: 04/05/2016 CLINICAL DATA:  Abdominal pain for 2 months. Previous cholecystectomy. Evaluate for choledocholithiasis. EXAM: MRI ABDOMEN WITHOUT AND WITH CONTRAST (INCLUDING MRCP) TECHNIQUE: Multiplanar multisequence MR imaging of the abdomen was performed both before and after the administration of intravenous contrast. Heavily T2-weighted images of the biliary and pancreatic ducts were obtained, and three-dimensional MRCP images were rendered by post processing. CONTRAST:  97m MULTIHANCE GADOBENATE DIMEGLUMINE 529 MG/ML IV SOLN COMPARISON:  01/11/2016  FINDINGS: Lower chest: No acute findings. Hepatobiliary: No mass or other parenchymal abnormality identified. Increase caliber of the common bile duct which measures 1 cm in diameter. Previously 6 mm. Mild intrahepatic biliary dilatation identified. Within the distal common bile duct there is a stone measuring 8 mm. Pancreas: No mass, inflammatory changes, or other parenchymal abnormality identified. Spleen:  Within normal limits in size and appearance. Adrenals/Urinary Tract: The adrenal glands are normal. No suspicious kidney abnormality. No mass or hydronephrosis. Stomach/Bowel: The stomach appears normal. No abnormal dilatation of the upper abdominal bowel loops . Vascular/Lymphatic: The abdominal aorta appears normal. No adenopathy identified. Other:  None. Musculoskeletal: No suspicious bone lesions identified. IMPRESSION: 1. Stone within the distal common bile duct measures 8 mm. There is a progressive increase caliber of the common bile duct with intrahepatic biliary dilatation identified. 2. Previous cholecystectomy. These results will be called to the ordering clinician or representative by the Radiologist Assistant, and communication documented in the PACS or zVision Dashboard. Electronically Signed   By: TKerby MoorsM.D.   On: 04/05/2016 10:10   Mr Abdomen Mrcp WMoise BoringContast  Result Date: 04/05/2016 CLINICAL DATA:  Abdominal pain for 2 months. Previous cholecystectomy. Evaluate for choledocholithiasis. EXAM: MRI ABDOMEN WITHOUT AND WITH CONTRAST (INCLUDING MRCP) TECHNIQUE: Multiplanar multisequence MR imaging of the abdomen was performed both before and after the administration of intravenous contrast. Heavily T2-weighted images of the biliary and pancreatic ducts were obtained, and three-dimensional MRCP images were rendered by post processing. CONTRAST:  271mMULTIHANCE GADOBENATE DIMEGLUMINE 529 MG/ML IV SOLN COMPARISON:  01/11/2016 FINDINGS: Lower chest: No acute findings. Hepatobiliary: No  mass or other parenchymal abnormality identified. Increase caliber of the common bile duct which measures 1 cm in diameter. Previously 6 mm. Mild intrahepatic biliary dilatation identified. Within the distal common bile duct there is a stone measuring 8 mm. Pancreas: No mass, inflammatory changes, or other parenchymal abnormality identified. Spleen:  Within normal limits in size and appearance. Adrenals/Urinary Tract: The adrenal glands are normal. No suspicious kidney abnormality. No mass or hydronephrosis. Stomach/Bowel: The stomach appears normal. No abnormal dilatation of the upper abdominal bowel loops . Vascular/Lymphatic: The abdominal aorta appears normal. No adenopathy identified. Other:  None. Musculoskeletal: No suspicious bone lesions identified. IMPRESSION: 1. Stone within the distal common bile duct measures 8 mm. There is a progressive increase caliber of the common bile duct with intrahepatic biliary dilatation identified. 2. Previous cholecystectomy. These results will be called to the ordering clinician or representative by the Radiologist Assistant, and communication documented in the PACS or zVision Dashboard. Electronically Signed   By: TaKerby Moors.D.   On: 04/05/2016 10:10    ROS Blood pressure (!Marland Kitchen  141/92, pulse 92, temperature 98.1 F (36.7 C), temperature source Axillary, resp. rate 12, height 5' 6.5" (1.689 m), weight 218 lb 4.1 oz (99 kg), last menstrual period 02/11/2012, SpO2 99 %. Physical Exam Alert and oriented. Skin warm and dry. Oral mucosa is moist.   . Sclera anicteric, conjunctivae is pink. Thyroid not enlarged. No cervical lymphadenopathy. Lungs clear. Heart regular rate and rhythm.  Abdomen is soft. Bowel sounds are positive. No hepatomegaly. No abdominal masses felt. No tenderness.  No edema to lower extremities. Assessment/Plan: CBD stone. Will discuss with Dr. Laural Golden ERCP.   Antolin Belsito W 04/05/2016, 11:31 AM

## 2016-04-05 NOTE — Anesthesia Preprocedure Evaluation (Signed)
Anesthesia Evaluation  Patient identified by MRN, date of birth, ID band Patient awake    Reviewed: Allergy & Precautions, H&P , NPO status , Patient's Chart, lab work & pertinent test results, reviewed documented beta blocker date and time   Airway Mallampati: II  TM Distance: >3 FB Neck ROM: full    Dental  (+) Teeth Intact, Dental Advisory Given   Pulmonary shortness of breath, sleep apnea , pneumonia, resolved,    breath sounds clear to auscultation       Cardiovascular hypertension, On Medications +CHF and + DOE  + Valvular Problems/Murmurs  Rhythm:regular Rate:Normal     Neuro/Psych  Headaches, PSYCHIATRIC DISORDERS Depression  Neuromuscular disease    GI/Hepatic negative GI ROS, Neg liver ROS,   Endo/Other  negative endocrine ROS  Renal/GU negative Renal ROS  negative genitourinary   Musculoskeletal  (+) Fibromyalgia -  Abdominal   Peds  Hematology  (+) anemia ,   Anesthesia Other Findings Hx Bleomycin chemo Rx  Reproductive/Obstetrics negative OB ROS                             Anesthesia Physical Anesthesia Plan  ASA: III  Anesthesia Plan: General   Post-op Pain Management:    Induction: Intravenous  Airway Management Planned: Oral ETT  Additional Equipment:   Intra-op Plan:   Post-operative Plan: Extubation in OR  Informed Consent: I have reviewed the patients History and Physical, chart, labs and discussed the procedure including the risks, benefits and alternatives for the proposed anesthesia with the patient or authorized representative who has indicated his/her understanding and acceptance.     Plan Discussed with:   Anesthesia Plan Comments: (Hx Bleomycin chemo.)        Anesthesia Quick Evaluation

## 2016-04-05 NOTE — Transfer of Care (Signed)
Immediate Anesthesia Transfer of Care Note  Patient: SHERL NEWBURG  Procedure(s) Performed: Procedure(s): ENDOSCOPIC RETROGRADE CHOLANGIOPANCREATOGRAPHY (ERCP), STONE BASKET EXTRACTION (N/A) SPHINCTEROTOMY (N/A) PANCREATIC STENT PLACEMENT (N/A)  Patient Location: PACU  Anesthesia Type:General  Level of Consciousness: awake and alert   Airway & Oxygen Therapy: Patient Spontanous Breathing and Patient connected to nasal cannula oxygen  Post-op Assessment: Report given to RN  Post vital signs: Reviewed and stable  Last Vitals:  Vitals:   04/05/16 1515 04/05/16 1648  BP: (!) 137/92 (!) (P) 141/90  Pulse:  (P) 82  Resp: 16 (P) 10  Temp:  (P) 36.8 C    Last Pain:  Vitals:   04/05/16 1405  TempSrc: Oral  PainSc: 0-No pain      Patients Stated Pain Goal: 8 ([id number])  Complications: No apparent anesthesia complications

## 2016-04-05 NOTE — Progress Notes (Signed)
In so see patient at this time, and administer medications to patient.  Patient explained that she was going to get Lovenox as a apart of her medication regimen. Patient refused shot at this time. Patient explained the risks and benefits of DVT prophylaxis, and explained in great detail the associated risks with declining her recommended medication regimen. Patient verbalized back risk factors associated with DVT prophylaxis and understanding of not getting the shot.  Patient request something else at this time. MD [place] called and notified of patients request.  SCD to be ordered at this time.  Will continue to monitor the patient at this time.

## 2016-04-05 NOTE — Anesthesia Procedure Notes (Signed)
Procedure Name: Intubation Date/Time: 04/05/2016 3:31 PM Performed by: Tressie Stalker E Pre-anesthesia Checklist: Patient identified, Patient being monitored, Timeout performed, Emergency Drugs available and Suction available Patient Re-evaluated:Patient Re-evaluated prior to inductionOxygen Delivery Method: Circle system utilized Preoxygenation: Pre-oxygenation with 100% oxygen Intubation Type: IV induction Ventilation: Mask ventilation without difficulty Laryngoscope Size: Mac and 3 Grade View: Grade I Tube type: Oral Tube size: 7.0 mm Number of attempts: 1 Airway Equipment and Method: Stylet Placement Confirmation: ETT inserted through vocal cords under direct vision,  positive ETCO2 and breath sounds checked- equal and bilateral Secured at: 21 cm Tube secured with: Tape Dental Injury: Teeth and Oropharynx as per pre-operative assessment

## 2016-04-05 NOTE — Op Note (Signed)
Brief office note; Dilated duodenum. Small sphincterotomy difficult cannulation. CBD and CHD measured about 12-13 mm with 1 filling defect Sphincterotomy extended. 2 stones removed using Dormia basket. One stone was round and the other stone was faceted. 4 Fr 5 cm long single pancreatic stent placed for prophylaxis.

## 2016-04-05 NOTE — Progress Notes (Signed)
Dr. Laural Golden informed about patient MRI results.

## 2016-04-05 NOTE — Care Management Note (Signed)
Case Management Note  Patient Details  Name: [name] [name] MRN: [id number] Date of Birth: September 02, 1970  Admitted with abd pain and undergoing MRCP today. Pt chart reviewed for CM needs. She is from home, lives with family. She is ind with ADL's and has no HH services. She has PCP, Dr. [name] and insurance with drug coverage. Pt plans to DC home with self care. No CM needs anticipated.  Expected Discharge Date:    04/06/2016              Expected Discharge Plan:  Home/Self Care  In-House Referral:  NA  Discharge planning Services  CM Consult  Post Acute Care Choice:  NA Choice offered to:  NA   Status of Service:  Completed, signed off  Sherald Barge, RN 04/05/2016, 1:39 PM

## 2016-04-05 NOTE — Op Note (Signed)
[facility] Patient Name: [name] [name] Procedure Date: 04/05/2016 3:27 PM MRN: [id number] Date of Birth: 1970-11-15 Attending MD: Hildred Laser , MD CSN: [id number] Age: 45 Admit Type: Inpatient Procedure:                ERCP Indications:              For therapy of bile duct stones Providers:                Hildred Laser, MD, Gwynneth Albright RN, RN,                            Randa Spike, Technician Referring MD:             Barney Drain, MD and Verlee Monte, MD Medicines:                General Anesthesia Complications:            No immediate complications. Estimated Blood Loss:     Estimated blood loss: none. Procedure:                Pre-Anesthesia Assessment:                           - Prior to the procedure, a History and Physical                            was performed, and patient medications and                            allergies were reviewed. The patient's tolerance of                            previous anesthesia was also reviewed. The risks                            and benefits of the procedure and the sedation                            options and risks were discussed with the patient.                            All questions were answered, and informed consent                            was obtained. Prior Anticoagulants: The patient has                            taken no previous anticoagulant or antiplatelet                            agents. ASA Grade Assessment: II - A patient with                            mild systemic disease. After reviewing the risks  and benefits, the patient was deemed in                            satisfactory condition to undergo the procedure.                           After obtaining informed consent, the scope was                            passed under direct vision. Throughout the                            procedure, the patient's blood pressure, pulse, and      oxygen saturations were monitored continuously. The                            [id number]) was introduced through the and                            used to inject contrast into and used to inject                            contrast into the bile duct and ventral pancreatic                            duct. The ERCP was accomplished without difficulty.                            The patient tolerated the procedure well. Scope In: 3:50:54 PM Scope Out: 4:33:07 PM Total Procedure Duration: 0 hours 42 minutes 13 seconds  Findings:      The scout film was normal. The esophagus was successfully intubated       under direct vision. The scope was advanced to a normal major papilla in       the descending duodenum without detailed examination of the pharynx,       larynx and associated structures, and upper GI tract. The upper GI tract       was grossly normal exept somewhat dilated duodenum.The major papilla was       normal. 0.035 inch x 260 cm straight Hydra Jagwire was passed into the       biliary tree. The traction (standard) sphincterotome and Autotome       sphincterotome were passed over the guidewire and the bile duct was then       deeply cannulated. Contrast was injected. I personally interpreted the       bile duct images. Image quality was adequate. Contrast extended to the       main bile duct. Contrast extended to the bifurcation. Contrast extended       to the hepatic ducts. Choledocholithiasis was found in a nondilated       duct. The common bile duct contained filling defect(s) thought to be a       stone. A 10 mm biliary sphincterotomy was made with a braided Autotome       sphincterotome using ERBE electrocautery. There was no       post-sphincterotomy bleeding. The middle third  of the main bile duct       contained two stones, the largest of which was 8 mm in diameter. One 4       Fr by 5 cm pancreatic stent with a single internal pigtail was placed       into the  ventral pancreatic duct. The stent was in good position. Impression:               - The major papilla small sphincterotomy. Difficult                            cannulation.                           - PD cannulated with guidewire. Contrast not                            injected.                           - Dilated biliary system with filling defect noted                            consistent with stone.                           - Biliary sphincterotomy was extended to about 10                            mm..                           - Removal of stones(2) was accomplished with dormia                            basket. No stent was inserted.                           - One pancreatic stent was placed into the ventral                            pancreatic duct. Moderate Sedation:      Per Anesthesia Care Recommendation:           - Avoid aspirin and nonsteroidal anti-inflammatory                            medicines for 3 days.                           - Clear liquid diet.                           - LFTs and serum amylase in am.                           - Return patient to hospital ward for ongoing care. Procedure Code(s):        --- Professional ---  [id number], Endoscopic retrograde                            cholangiopancreatography (ERCP); with placement of                            endoscopic stent into biliary or pancreatic duct,                            including pre- and post-dilation and guide wire                            passage, when performed, including sphincterotomy,                            when performed, each stent Diagnosis Code(s):        --- Professional ---                           K80.50, Calculus of bile duct without cholangitis                            or cholecystitis without obstruction                           R93.2, Abnormal findings on diagnostic imaging of                            liver and biliary tract CPT  copyright 2016 American Medical Association. All rights reserved. The codes documented in this report are preliminary and upon coder review may  be revised to meet current compliance requirements. Hildred Laser, MD Hildred Laser, MD 04/05/2016 5:26:24 PM This report has been signed electronically. Number of Addenda: 0

## 2016-04-05 NOTE — Anesthesia Postprocedure Evaluation (Signed)
Anesthesia Post Note  Patient: [name] [name]  Procedure(s) Performed: Procedure(s) (LRB): ENDOSCOPIC RETROGRADE CHOLANGIOPANCREATOGRAPHY (ERCP), STONE BASKET EXTRACTION (N/A) SPHINCTEROTOMY (N/A) PANCREATIC STENT PLACEMENT (N/A)  Patient location during evaluation: PACU Anesthesia Type: General Level of consciousness: awake and alert and oriented Pain management: pain level controlled Vital Signs Assessment: post-procedure vital signs reviewed and stable Respiratory status: spontaneous breathing Cardiovascular status: blood pressure returned to baseline Postop Assessment: no signs of nausea or vomiting Anesthetic complications: no    Last Vitals:  Vitals:   04/05/16 1515 04/05/16 1648  BP: (!) 137/92 (!) (P) 141/90  Pulse:  (P) 82  Resp: 16 (P) 10  Temp:  (P) 36.8 C    Last Pain:  Vitals:   04/05/16 1405  TempSrc: Oral  PainSc: 0-No pain                 Valoria Tamburri

## 2016-04-05 NOTE — Progress Notes (Signed)
PROGRESS NOTE  [name] [name]  [id number] DOB: 1971/01/07 DOA: 04/04/2016 PCP: [name], MD Outpatient Specialists:  Subjective: Seen with her husband at bedside, denies any pain but has tenderness when I examined her.  Brief Narrative:  [name] [name] is a 45 y.o. female with medical history significant of lymphoma and hypothyroidism.  She had a cholecystectomy in 2015 and was admitted from 8/1-4/17 for recurrent stones found on MRCP and extraction with ERCP. On Sunday, yesterday and today, she has had recurrence of the same smpytoms - extreme and excruciating pain in midepigastric region.  No nausea/vomiting.  Normal BMs.  Had a CT today -pain was present at start but the pain increased throughout the test, possibly from contrast material.  Pain was intolerable.   The CT was abnormal and she was sent by Dr. Fields to the ER for admission.   Assessment & Plan:   Principal Problem:   Abdominal pain Active Problems:   Hypertension   Hodgkin\'s lymphoma (HCC)   Hypokalemia   Elevated LFTs   Hyponatremia   Abdominal pain secondary to choledocholithiasis -Patient is s/p cholecystectomy (2015) and ERCP/sphincterotomy (Aug 2017) now with biliary tract obstruction as evidenced by hyperbilirubinemia, elevated LFTs, and abnormal CT -MRCP showed 8 mm CBD stone, GI to evaluate. -Pain control with opioids.  HTN -Continue HCTZ.  Lymphoma -No evidence of recurrent disease at this time  Hypokalemia and hyponatremia -Resolved after IV fluid hydration and placement.  Hypothyroidism -Normal TSH in August -Continue Synthroid at current dose    DVT prophylaxis:  Code Status: Full Code Family Communication:  Disposition Plan:  Diet: Diet NPO time specified Diet clear liquid Room service appropriate? Yes; Fluid consistency: Thin  Consultants:   GI  Procedures:   None  Antimicrobials:   None   Objective: Vitals:   04/04/16 2350 04/05/16 0000 04/05/16 0500  04/05/16 0800  BP:  (!) 155/92 (!) 148/95 (!) 141/92  Pulse:   90 92  Resp:    12  Temp: 97.1 F (36.2 C)  98.1 F (36.7 C)   TempSrc: Oral  Axillary   SpO2:  98% 98% 99%  Weight: 99 kg (218 lb 4.1 oz)     Height: 5\' 6.5" (1.689 m)       Intake/Output Summary (Last 24 hours) at 04/05/16 1157 Last data filed at 04/05/16 0837  Gross per 24 hour  Intake             1975 ml  Output                0 ml  Net             19 75 ml   Filed Weights   04/04/16 1942 04/04/16 2350  Weight: 96.6 kg (213 lb) 99 kg (218 lb 4.1 oz)    Examination: General exam: Appears calm and comfortable  Respiratory system: Clear to auscultation. Respiratory effort normal. Cardiovascular system: S1 & S2 heard, RRR. No JVD, murmurs, rubs, gallops or clicks. No pedal edema. Gastrointestinal system: Abdomen is nondistended, soft and nontender. No organomegaly or masses felt. Normal bowel sounds heard. Central nervous system: Alert and oriented. No focal neurological deficits. Extremities: Symmetric 5 x 5 power. Skin: No rashes, lesions or ulcers Psychiatry: Judgement and insight appear normal. Mood & affect appropriate.   Data Reviewed: I have personally reviewed following labs and imaging studies  CBC:  Recent Labs Lab 04/04/16 2017 04/05/16 0442  WBC 8.8 6.8  NEUTROABS 7.4  --  HGB 15.2* 11.9*  HCT 41.9 34.1*  MCV 91.3 91.9  PLT 219 0000000   Basic Metabolic Panel:  Recent Labs Lab 04/04/16 2017 04/05/16 0442  NA 134* 136  K 3.1* 3.8  CL 101 106  CO2 21* 25  GLUCOSE 119* 98  BUN 10 7  CREATININE 0.89 0.75  CALCIUM 9.6 8.1*   GFR: Estimated Creatinine Clearance: 105.3 mL/min (by C-G formula based on SCr of 0.75 mg/dL). Liver Function Tests:  Recent Labs Lab 04/03/16 1613 04/04/16 2017 04/05/16 0442  AST 50* 93* 92*  ALT 50* 100* 95*  ALKPHOS 83 133* 98  BILITOT 1.0 2.1* 2.4*  PROT 6.6 8.2* 5.7*  ALBUMIN 4.1 4.5 3.2*    Recent Labs Lab 04/03/16 1613 04/04/16 2017  04/05/16 0442  LIPASE 25 24 19     Recent Labs Lab 04/04/16 2017  AMMONIA 13   Coagulation Profile:  Recent Labs Lab 04/04/16 2017  INR 0.92   Cardiac Enzymes: No results for input(s): CKTOTAL, CKMB, CKMBINDEX, TROPONINI in the last 168 hours. BNP (last 3 results) No results for input(s): PROBNP in the last 8760 hours. HbA1C: No results for input(s): HGBA1C in the last 72 hours. CBG: No results for input(s): GLUCAP in the last 168 hours. Lipid Profile: No results for input(s): CHOL, HDL, LDLCALC, TRIG, CHOLHDL, LDLDIRECT in the last 72 hours. Thyroid Function Tests: No results for input(s): TSH, T4TOTAL, FREET4, T3FREE, THYROIDAB in the last 72 hours. Anemia Panel: No results for input(s): VITAMINB12, FOLATE, FERRITIN, TIBC, IRON, RETICCTPCT in the last 72 hours. Urine analysis:    Component Value Date/Time   COLORURINE YELLOW 04/04/2016 [place] 04/04/2016 2315   LABSPEC 1.010 04/04/2016 2315   PHURINE 6.0 04/04/2016 2315   GLUCOSEU NEGATIVE 04/04/2016 2315   HGBUR MODERATE (A) 04/04/2016 2315   BILIRUBINUR NEGATIVE 04/04/2016 2315   KETONESUR TRACE (A) 04/04/2016 2315   PROTEINUR NEGATIVE 04/04/2016 2315   UROBILINOGEN 0.2 09/27/2012 1249   NITRITE NEGATIVE 04/04/2016 2315   LEUKOCYTESUR NEGATIVE 04/04/2016 2315   Sepsis Labs: @LABRCNTIP (procalcitonin:4,lacticidven:4)  ) Recent Results (from the past 240 hour(s))  MRSA PCR Screening     Status: None   Collection Time: 04/04/16 11:45 PM  Result Value Ref Range Status   MRSA by PCR NEGATIVE NEGATIVE Final    Comment:        The GeneXpert MRSA Assay (FDA approved for NASAL specimens only), is one component of a comprehensive MRSA colonization surveillance program. It is not intended to diagnose MRSA infection nor to guide or monitor treatment for MRSA infections.      Invalid input(s): PROCALCITONIN, LACTICACIDVEN   Radiology Studies: Ct Chest W Contrast  Result Date:  04/04/2016 CLINICAL DATA:  45 year old female with history of severe right upper quadrant abdominal pain for the past 3 days with nausea. History of lymphoma treated in 2014. EXAM: CT CHEST, ABDOMEN, AND PELVIS WITH CONTRAST TECHNIQUE: Multidetector CT imaging of the chest, abdomen and pelvis was performed following the standard protocol during bolus administration of intravenous contrast. CONTRAST:  117mL ISOVUE-300 IOPAMIDOL (ISOVUE-300) INJECTION 61% COMPARISON:  CT the abdomen and pelvis 01/10/2016. Chest CT 08/26/2015. FINDINGS: CT CHEST FINDINGS Cardiovascular: Heart size is normal. There is no significant pericardial fluid, thickening or pericardial calcification. Mediastinum/Nodes: Previously demonstrated small amount of sessile soft tissue adjacent to the proximal aortic arch in the anterior mediastinum is slightly smaller compared to the prior examination measuring only 2.1 x 0.9 cm on today's study (axial image 20 of series 14),  again most compatible with treated lymphoma. No other pathologically enlarged mediastinal or hilar lymph nodes. Esophagus is normal in appearance. No axillary lymphadenopathy. Lungs/Pleura: No acute consolidative airspace disease. No pleural effusions. No suspicious appearing pulmonary nodules or masses. Musculoskeletal: There are no aggressive appearing lytic or blastic lesions noted in the visualized portions of the skeleton. CT ABDOMEN PELVIS FINDINGS Hepatobiliary: No discrete cystic or solid hepatic lesions. Status post cholecystectomy. Compared to the prior examinations there is new moderate intrahepatic biliary ductal dilatation. Severe extrahepatic biliary ductal dilatation, with the common bile duct measuring up to 14 mm in the porta hepatis. Notably, immediately before the ampulla there is some intermediate to high attenuation material in the distal common bile duct, which could represent retained ductal stones, sludge balls, or soft tissue in the setting of an  ampullary neoplasm. Pancreas: No definite pancreatic mass. No pancreatic ductal dilatation. No pancreatic or peripancreatic fluid or inflammatory changes. Spleen: Unremarkable. Adrenals/Urinary Tract: Bilateral kidneys and bilateral adrenal glands are normal in appearance. No hydroureteronephrosis. Urinary bladder is normal in appearance. Stomach/Bowel: Normal appearance of the stomach. No pathologic dilatation of small bowel or colon. Normal appendix. Vascular/Lymphatic: No significant atherosclerotic disease, aneurysm or dissection identified in the abdominal or pelvic vasculature. Dilated left gonadal vein. No lymphadenopathy noted in the abdomen or pelvis. Reproductive: Uterus and ovaries are unremarkable in appearance. Other: No significant volume of ascites.  No pneumoperitoneum. Musculoskeletal: There are no aggressive appearing lytic or blastic lesions noted in the visualized portions of the skeleton. IMPRESSION: 1. New biliary tract obstruction which appears to be related to some intermediate to high attenuation material in the distal common bile duct at or immediately before the level of the ampulla. Whether or not this reflects retained ductal stones, sludge balls or small ampullary neoplasm is uncertain on today's examination, and further evaluation with MRI of the abdomen with and without IV gadolinium with MRCP is suggested. 2. No findings to suggest recurrent lymphoma in the chest, abdomen or pelvis. Evidence of treated lymphoma in the anterior mediastinum is unchanged. 3. Additional incidental findings, as above. These results will be called to the ordering clinician or representative by the Radiologist Assistant, and communication documented in the PACS or zVision Dashboard. Electronically Signed   By: Vinnie Langton M.D.   On: 04/04/2016 18:47   Ct Abdomen Pelvis W Contrast  Result Date: 04/04/2016 CLINICAL DATA:  45 year old female with history of severe right upper quadrant abdominal pain  for the past 3 days with nausea. History of lymphoma treated in 2014. EXAM: CT CHEST, ABDOMEN, AND PELVIS WITH CONTRAST TECHNIQUE: Multidetector CT imaging of the chest, abdomen and pelvis was performed following the standard protocol during bolus administration of intravenous contrast. CONTRAST:  139mL ISOVUE-300 IOPAMIDOL (ISOVUE-300) INJECTION 61% COMPARISON:  CT the abdomen and pelvis 01/10/2016. Chest CT 08/26/2015. FINDINGS: CT CHEST FINDINGS Cardiovascular: Heart size is normal. There is no significant pericardial fluid, thickening or pericardial calcification. Mediastinum/Nodes: Previously demonstrated small amount of sessile soft tissue adjacent to the proximal aortic arch in the anterior mediastinum is slightly smaller compared to the prior examination measuring only 2.1 x 0.9 cm on today's study (axial image 20 of series 14), again most compatible with treated lymphoma. No other pathologically enlarged mediastinal or hilar lymph nodes. Esophagus is normal in appearance. No axillary lymphadenopathy. Lungs/Pleura: No acute consolidative airspace disease. No pleural effusions. No suspicious appearing pulmonary nodules or masses. Musculoskeletal: There are no aggressive appearing lytic or blastic lesions noted in the visualized portions of the  skeleton. CT ABDOMEN PELVIS FINDINGS Hepatobiliary: No discrete cystic or solid hepatic lesions. Status post cholecystectomy. Compared to the prior examinations there is new moderate intrahepatic biliary ductal dilatation. Severe extrahepatic biliary ductal dilatation, with the common bile duct measuring up to 14 mm in the porta hepatis. Notably, immediately before the ampulla there is some intermediate to high attenuation material in the distal common bile duct, which could represent retained ductal stones, sludge balls, or soft tissue in the setting of an ampullary neoplasm. Pancreas: No definite pancreatic mass. No pancreatic ductal dilatation. No pancreatic or  peripancreatic fluid or inflammatory changes. Spleen: Unremarkable. Adrenals/Urinary Tract: Bilateral kidneys and bilateral adrenal glands are normal in appearance. No hydroureteronephrosis. Urinary bladder is normal in appearance. Stomach/Bowel: Normal appearance of the stomach. No pathologic dilatation of small bowel or colon. Normal appendix. Vascular/Lymphatic: No significant atherosclerotic disease, aneurysm or dissection identified in the abdominal or pelvic vasculature. Dilated left gonadal vein. No lymphadenopathy noted in the abdomen or pelvis. Reproductive: Uterus and ovaries are unremarkable in appearance. Other: No significant volume of ascites.  No pneumoperitoneum. Musculoskeletal: There are no aggressive appearing lytic or blastic lesions noted in the visualized portions of the skeleton. IMPRESSION: 1. New biliary tract obstruction which appears to be related to some intermediate to high attenuation material in the distal common bile duct at or immediately before the level of the ampulla. Whether or not this reflects retained ductal stones, sludge balls or small ampullary neoplasm is uncertain on today's examination, and further evaluation with MRI of the abdomen with and without IV gadolinium with MRCP is suggested. 2. No findings to suggest recurrent lymphoma in the chest, abdomen or pelvis. Evidence of treated lymphoma in the anterior mediastinum is unchanged. 3. Additional incidental findings, as above. These results will be called to the ordering clinician or representative by the Radiologist Assistant, and communication documented in the PACS or zVision Dashboard. Electronically Signed   By: Vinnie Langton M.D.   On: 04/04/2016 18:47   Mr 3d Recon At Scanner  Result Date: 04/05/2016 CLINICAL DATA:  Abdominal pain for 2 months. Previous cholecystectomy. Evaluate for choledocholithiasis. EXAM: MRI ABDOMEN WITHOUT AND WITH CONTRAST (INCLUDING MRCP) TECHNIQUE: Multiplanar multisequence MR  imaging of the abdomen was performed both before and after the administration of intravenous contrast. Heavily T2-weighted images of the biliary and pancreatic ducts were obtained, and three-dimensional MRCP images were rendered by post processing. CONTRAST:  52mL MULTIHANCE GADOBENATE DIMEGLUMINE 529 MG/ML IV SOLN COMPARISON:  01/11/2016 FINDINGS: Lower chest: No acute findings. Hepatobiliary: No mass or other parenchymal abnormality identified. Increase caliber of the common bile duct which measures 1 cm in diameter. Previously 6 mm. Mild intrahepatic biliary dilatation identified. Within the distal common bile duct there is a stone measuring 8 mm. Pancreas: No mass, inflammatory changes, or other parenchymal abnormality identified. Spleen:  Within normal limits in size and appearance. Adrenals/Urinary Tract: The adrenal glands are normal. No suspicious kidney abnormality. No mass or hydronephrosis. Stomach/Bowel: The stomach appears normal. No abnormal dilatation of the upper abdominal bowel loops . Vascular/Lymphatic: The abdominal aorta appears normal. No adenopathy identified. Other:  None. Musculoskeletal: No suspicious bone lesions identified. IMPRESSION: 1. Stone within the distal common bile duct measures 8 mm. There is a progressive increase caliber of the common bile duct with intrahepatic biliary dilatation identified. 2. Previous cholecystectomy. These results will be called to the ordering clinician or representative by the Radiologist Assistant, and communication documented in the PACS or zVision Dashboard. Electronically Signed   By:  Kerby Moors M.D.   On: 04/05/2016 10:10   Mr Abdomen Mrcp Moise Boring Contast  Result Date: 04/05/2016 CLINICAL DATA:  Abdominal pain for 2 months. Previous cholecystectomy. Evaluate for choledocholithiasis. EXAM: MRI ABDOMEN WITHOUT AND WITH CONTRAST (INCLUDING MRCP) TECHNIQUE: Multiplanar multisequence MR imaging of the abdomen was performed both before and after the  administration of intravenous contrast. Heavily T2-weighted images of the biliary and pancreatic ducts were obtained, and three-dimensional MRCP images were rendered by post processing. CONTRAST:  29mL MULTIHANCE GADOBENATE DIMEGLUMINE 529 MG/ML IV SOLN COMPARISON:  01/11/2016 FINDINGS: Lower chest: No acute findings. Hepatobiliary: No mass or other parenchymal abnormality identified. Increase caliber of the common bile duct which measures 1 cm in diameter. Previously 6 mm. Mild intrahepatic biliary dilatation identified. Within the distal common bile duct there is a stone measuring 8 mm. Pancreas: No mass, inflammatory changes, or other parenchymal abnormality identified. Spleen:  Within normal limits in size and appearance. Adrenals/Urinary Tract: The adrenal glands are normal. No suspicious kidney abnormality. No mass or hydronephrosis. Stomach/Bowel: The stomach appears normal. No abnormal dilatation of the upper abdominal bowel loops . Vascular/Lymphatic: The abdominal aorta appears normal. No adenopathy identified. Other:  None. Musculoskeletal: No suspicious bone lesions identified. IMPRESSION: 1. Stone within the distal common bile duct measures 8 mm. There is a progressive increase caliber of the common bile duct with intrahepatic biliary dilatation identified. 2. Previous cholecystectomy. These results will be called to the ordering clinician or representative by the Radiologist Assistant, and communication documented in the PACS or zVision Dashboard. Electronically Signed   By: Kerby Moors M.D.   On: 04/05/2016 10:10        Scheduled Meds: . cyclobenzaprine  10 mg Oral QHS  . hydrochlorothiazide  12.5 mg Oral Daily  . levothyroxine  125 mcg Oral QAC breakfast  . LORazepam  1 mg Intravenous Once  . norethindrone-ethinyl estradiol  1 tablet Oral Daily  . temazepam  15 mg Oral QHS   Continuous Infusions: . lactated ringers 125 mL (04/04/16 2358)     LOS: 0 days    Time spent: 35  minutes    Keiyon Plack A, MD [name] Pager [phone]  If 7PM-7AM, please contact night-coverage [email or web address] Password [email or web address] 04/05/2016, 11:57 AM

## 2016-04-06 ENCOUNTER — Observation Stay (HOSPITAL_COMMUNITY): Payer: Commercial Managed Care - PPO

## 2016-04-06 DIAGNOSIS — R932 Abnormal findings on diagnostic imaging of liver and biliary tract: Secondary | ICD-10-CM | POA: Diagnosis not present

## 2016-04-06 DIAGNOSIS — K8050 Calculus of bile duct without cholangitis or cholecystitis without obstruction: Secondary | ICD-10-CM | POA: Diagnosis not present

## 2016-04-06 DIAGNOSIS — R7989 Other specified abnormal findings of blood chemistry: Secondary | ICD-10-CM | POA: Diagnosis not present

## 2016-04-06 DIAGNOSIS — I10 Essential (primary) hypertension: Secondary | ICD-10-CM | POA: Diagnosis not present

## 2016-04-06 DIAGNOSIS — R1011 Right upper quadrant pain: Secondary | ICD-10-CM | POA: Diagnosis not present

## 2016-04-06 DIAGNOSIS — E876 Hypokalemia: Secondary | ICD-10-CM | POA: Diagnosis not present

## 2016-04-06 LAB — COMPREHENSIVE METABOLIC PANEL
ALT: 84 U/L — ABNORMAL HIGH (ref 14–54)
AST: 50 U/L — ABNORMAL HIGH (ref 15–41)
Albumin: 3.3 g/dL — ABNORMAL LOW (ref 3.5–5.0)
Alkaline Phosphatase: 111 U/L (ref 38–126)
Anion gap: 6 (ref 5–15)
BUN: 6 mg/dL (ref 6–20)
CO2: 26 mmol/L (ref 22–32)
Calcium: 8.6 mg/dL — ABNORMAL LOW (ref 8.9–10.3)
Chloride: 103 mmol/L (ref 101–111)
Creatinine, Ser: 0.71 mg/dL (ref 0.44–1.00)
GFR calc Af Amer: >60 mL/min (ref >60)
GFR calc non Af Amer: >60 mL/min (ref >60)
Glucose, Bld: 83 mg/dL (ref 65–99)
Potassium: 3.3 mmol/L — ABNORMAL LOW (ref 3.5–5.1)
Sodium: 135 mmol/L (ref 135–145)
Total Bilirubin: 1 mg/dL (ref 0.3–1.2)
Total Protein: 6.1 g/dL — ABNORMAL LOW (ref 6.5–8.1)

## 2016-04-06 LAB — CBC
HCT: 34.9 % — ABNORMAL LOW (ref 36.0–46.0)
Hemoglobin: 12 g/dL (ref 12.0–15.0)
MCH: 32.2 pg (ref 26.0–34.0)
MCHC: 34.4 g/dL (ref 30.0–36.0)
MCV: 93.6 fL (ref 78.0–100.0)
Platelets: 183 10*3/uL (ref 150–400)
RBC: 3.73 MIL/uL — ABNORMAL LOW (ref 3.87–5.11)
RDW: 11.7 % (ref 11.5–15.5)
WBC: 4.6 10*3/uL (ref 4.0–10.5)

## 2016-04-06 LAB — AMYLASE: Amylase: 78 U/L (ref 28–100)

## 2016-04-06 MED ORDER — POTASSIUM CHLORIDE CRYS ER 20 MEQ PO TBCR
40.0000 meq | EXTENDED_RELEASE_TABLET | Freq: Once | ORAL | Status: AC
Start: 2016-04-06 — End: 2016-04-06
  Administered 2016-04-06: 40 meq via ORAL
  Filled 2016-04-06: qty 2

## 2016-04-06 NOTE — Discharge Summary (Signed)
Physician Discharge Summary  [name] [name] [id number] DOB: Aug 29, 1970 DOA: 04/04/2016  PCP: [name], MD  Admit date: 04/04/2016 Discharge date: 04/06/2016  Admitted From: Home Disposition: Home  Recommendations for Outpatient Follow-up:  1. Follow up with PCP in 1-2 weeks 2. Please obtain BMP/CBC in one week 3. Needs follow-up x-ray in 10-14 days  Home Health: N/A Equipment/Devices: N/A  Discharge Condition: Stable CODE STATUS: Full Diet recommendation: Regular diet  Brief/Interim Summary: [name] [name] is a 45 y.o. female with medical history significant of lymphoma and hypothyroidism.  She had a cholecystectomy in 2015 and was admitted from 8/1-4/17 for recurrent stones found on MRCP and extraction with ERCP. On Sunday, yesterday and today, she has had recurrence of the same smpytoms - extreme and excruciating pain in midepigastric region.  No nausea/vomiting.  Normal BMs.  Had a CT today -pain was present at start but the pain increased throughout the test, possibly from contrast material.  Pain was intolerable.   The CT was abnormal and she was sent by Dr. Oneida Alar to the ER for admission.   Discharge Diagnoses:  Principal Problem:   Abdominal pain Active Problems:   Hypertension   Hodgkin's lymphoma (HCC)   Hypokalemia   Elevated LFTs   Hyponatremia   Abdominal pain secondary to choledocholithiasis -Patient is s/p cholecystectomy (2015) and ERCP/sphincterotomy (Aug 2017) now with biliary tract obstruction as evidenced by hyperbilirubinemia, elevated LFTs, and abnormal CT -MRCP showed 8 mm CBD stone, Pain control with opioids. -GI consulted ERCP was done with sphincterotomy, retrieval of 2 stones and placement of pancreatic stent. -Per GI okay for discharge, needs follow-up with GI and likely abdominal x-ray first in follow-up.  HTN -Continue HCTZ.  History of NHL -No evidence of recurrent disease at this time  Hypokalemia and hyponatremia -Resolved  after IV fluid hydration and placement.  Hypothyroidism -Normal TSH in August -Continue Synthroid at current dose  Discharge Instructions  Discharge Instructions    Diet - low sodium heart healthy    Complete by:  As directed    Increase activity slowly    Complete by:  As directed        Medication List    TAKE these medications   ADVIL PM PO Take 2 tablets by mouth at bedtime as needed (pain/sleep).   cyclobenzaprine 10 MG tablet Commonly known as:  FLEXERIL Take 10 mg by mouth at bedtime.   HYDROcodone-acetaminophen 5-325 MG tablet Commonly known as:  NORCO/VICODIN Take 2 tablets by mouth every 4 (four) hours as needed.   JUNEL FE 1/20 1-20 MG-MCG tablet Generic drug:  norethindrone-ethinyl estradiol Take 1 tablet by mouth daily.   levothyroxine 125 MCG tablet Commonly known as:  SYNTHROID, LEVOTHROID Take 125 mcg by mouth daily before breakfast.   piroxicam 20 MG capsule Commonly known as:  FELDENE Take 1 capsule by mouth daily as needed (plantar facitis.).   temazepam 15 MG capsule Commonly known as:  RESTORIL Take 15 mg by mouth at bedtime.       Allergies  Allergen Reactions  . Other Other (See Comments)    Patient can not have straight oxygen due to reaction with chemo medications.   . Sulfur Anaphylaxis    Consultations:  GI   Procedures/Studies: Dg Abd 1 View  Result Date: 04/06/2016 CLINICAL DATA:  Choledocholithiasis with biliary dilatation. Prophylactic pancreatic stent insertion during ERCP. EXAM: ABDOMEN - 1 VIEW COMPARISON:  04/05/2016 FINDINGS: Contrast within the transverse colon. This overlies the epigastric region. Mild nonspecific  small bowel distention in the lower abdomen. Small caliber short radiopaque plastic stent noted at the L1 right transverse process level. This is partially obscured by the colonic contrast. Suspect this is the pancreatic stent. No acute osseous finding or other significant abnormality. IMPRESSION:  Nonspecific nonobstructive bowel gas pattern. Small caliber endoscopic pancreatic stent localized as above. Electronically Signed   By: Jerilynn Mages.  Shick M.D.   On: 04/06/2016 09:24   Ct Chest W Contrast  Result Date: 04/04/2016 CLINICAL DATA:  45 year old female with history of severe right upper quadrant abdominal pain for the past 3 days with nausea. History of lymphoma treated in 2014. EXAM: CT CHEST, ABDOMEN, AND PELVIS WITH CONTRAST TECHNIQUE: Multidetector CT imaging of the chest, abdomen and pelvis was performed following the standard protocol during bolus administration of intravenous contrast. CONTRAST:  16mL ISOVUE-300 IOPAMIDOL (ISOVUE-300) INJECTION 61% COMPARISON:  CT the abdomen and pelvis 01/10/2016. Chest CT 08/26/2015. FINDINGS: CT CHEST FINDINGS Cardiovascular: Heart size is normal. There is no significant pericardial fluid, thickening or pericardial calcification. Mediastinum/Nodes: Previously demonstrated small amount of sessile soft tissue adjacent to the proximal aortic arch in the anterior mediastinum is slightly smaller compared to the prior examination measuring only 2.1 x 0.9 cm on today's study (axial image 20 of series 14), again most compatible with treated lymphoma. No other pathologically enlarged mediastinal or hilar lymph nodes. Esophagus is normal in appearance. No axillary lymphadenopathy. Lungs/Pleura: No acute consolidative airspace disease. No pleural effusions. No suspicious appearing pulmonary nodules or masses. Musculoskeletal: There are no aggressive appearing lytic or blastic lesions noted in the visualized portions of the skeleton. CT ABDOMEN PELVIS FINDINGS Hepatobiliary: No discrete cystic or solid hepatic lesions. Status post cholecystectomy. Compared to the prior examinations there is new moderate intrahepatic biliary ductal dilatation. Severe extrahepatic biliary ductal dilatation, with the common bile duct measuring up to 14 mm in the porta hepatis. Notably,  immediately before the ampulla there is some intermediate to high attenuation material in the distal common bile duct, which could represent retained ductal stones, sludge balls, or soft tissue in the setting of an ampullary neoplasm. Pancreas: No definite pancreatic mass. No pancreatic ductal dilatation. No pancreatic or peripancreatic fluid or inflammatory changes. Spleen: Unremarkable. Adrenals/Urinary Tract: Bilateral kidneys and bilateral adrenal glands are normal in appearance. No hydroureteronephrosis. Urinary bladder is normal in appearance. Stomach/Bowel: Normal appearance of the stomach. No pathologic dilatation of small bowel or colon. Normal appendix. Vascular/Lymphatic: No significant atherosclerotic disease, aneurysm or dissection identified in the abdominal or pelvic vasculature. Dilated left gonadal vein. No lymphadenopathy noted in the abdomen or pelvis. Reproductive: Uterus and ovaries are unremarkable in appearance. Other: No significant volume of ascites.  No pneumoperitoneum. Musculoskeletal: There are no aggressive appearing lytic or blastic lesions noted in the visualized portions of the skeleton. IMPRESSION: 1. New biliary tract obstruction which appears to be related to some intermediate to high attenuation material in the distal common bile duct at or immediately before the level of the ampulla. Whether or not this reflects retained ductal stones, sludge balls or small ampullary neoplasm is uncertain on today's examination, and further evaluation with MRI of the abdomen with and without IV gadolinium with MRCP is suggested. 2. No findings to suggest recurrent lymphoma in the chest, abdomen or pelvis. Evidence of treated lymphoma in the anterior mediastinum is unchanged. 3. Additional incidental findings, as above. These results will be called to the ordering clinician or representative by the Radiologist Assistant, and communication documented in the PACS or zVision Dashboard.  Electronically Signed   By: Vinnie Langton M.D.   On: 04/04/2016 18:47   Ct Abdomen Pelvis W Contrast  Result Date: 04/04/2016 CLINICAL DATA:  45 year old female with history of severe right upper quadrant abdominal pain for the past 3 days with nausea. History of lymphoma treated in 2014. EXAM: CT CHEST, ABDOMEN, AND PELVIS WITH CONTRAST TECHNIQUE: Multidetector CT imaging of the chest, abdomen and pelvis was performed following the standard protocol during bolus administration of intravenous contrast. CONTRAST:  153mL ISOVUE-300 IOPAMIDOL (ISOVUE-300) INJECTION 61% COMPARISON:  CT the abdomen and pelvis 01/10/2016. Chest CT 08/26/2015. FINDINGS: CT CHEST FINDINGS Cardiovascular: Heart size is normal. There is no significant pericardial fluid, thickening or pericardial calcification. Mediastinum/Nodes: Previously demonstrated small amount of sessile soft tissue adjacent to the proximal aortic arch in the anterior mediastinum is slightly smaller compared to the prior examination measuring only 2.1 x 0.9 cm on today's study (axial image 20 of series 14), again most compatible with treated lymphoma. No other pathologically enlarged mediastinal or hilar lymph nodes. Esophagus is normal in appearance. No axillary lymphadenopathy. Lungs/Pleura: No acute consolidative airspace disease. No pleural effusions. No suspicious appearing pulmonary nodules or masses. Musculoskeletal: There are no aggressive appearing lytic or blastic lesions noted in the visualized portions of the skeleton. CT ABDOMEN PELVIS FINDINGS Hepatobiliary: No discrete cystic or solid hepatic lesions. Status post cholecystectomy. Compared to the prior examinations there is new moderate intrahepatic biliary ductal dilatation. Severe extrahepatic biliary ductal dilatation, with the common bile duct measuring up to 14 mm in the porta hepatis. Notably, immediately before the ampulla there is some intermediate to high attenuation material in the distal  common bile duct, which could represent retained ductal stones, sludge balls, or soft tissue in the setting of an ampullary neoplasm. Pancreas: No definite pancreatic mass. No pancreatic ductal dilatation. No pancreatic or peripancreatic fluid or inflammatory changes. Spleen: Unremarkable. Adrenals/Urinary Tract: Bilateral kidneys and bilateral adrenal glands are normal in appearance. No hydroureteronephrosis. Urinary bladder is normal in appearance. Stomach/Bowel: Normal appearance of the stomach. No pathologic dilatation of small bowel or colon. Normal appendix. Vascular/Lymphatic: No significant atherosclerotic disease, aneurysm or dissection identified in the abdominal or pelvic vasculature. Dilated left gonadal vein. No lymphadenopathy noted in the abdomen or pelvis. Reproductive: Uterus and ovaries are unremarkable in appearance. Other: No significant volume of ascites.  No pneumoperitoneum. Musculoskeletal: There are no aggressive appearing lytic or blastic lesions noted in the visualized portions of the skeleton. IMPRESSION: 1. New biliary tract obstruction which appears to be related to some intermediate to high attenuation material in the distal common bile duct at or immediately before the level of the ampulla. Whether or not this reflects retained ductal stones, sludge balls or small ampullary neoplasm is uncertain on today's examination, and further evaluation with MRI of the abdomen with and without IV gadolinium with MRCP is suggested. 2. No findings to suggest recurrent lymphoma in the chest, abdomen or pelvis. Evidence of treated lymphoma in the anterior mediastinum is unchanged. 3. Additional incidental findings, as above. These results will be called to the ordering clinician or representative by the Radiologist Assistant, and communication documented in the PACS or zVision Dashboard. Electronically Signed   By: Vinnie Langton M.D.   On: 04/04/2016 18:47   Mr 3d Recon At Scanner  Result  Date: 04/05/2016 CLINICAL DATA:  Abdominal pain for 2 months. Previous cholecystectomy. Evaluate for choledocholithiasis. EXAM: MRI ABDOMEN WITHOUT AND WITH CONTRAST (INCLUDING MRCP) TECHNIQUE: Multiplanar multisequence MR imaging of the abdomen was  performed both before and after the administration of intravenous contrast. Heavily T2-weighted images of the biliary and pancreatic ducts were obtained, and three-dimensional MRCP images were rendered by post processing. CONTRAST:  23mL MULTIHANCE GADOBENATE DIMEGLUMINE 529 MG/ML IV SOLN COMPARISON:  01/11/2016 FINDINGS: Lower chest: No acute findings. Hepatobiliary: No mass or other parenchymal abnormality identified. Increase caliber of the common bile duct which measures 1 cm in diameter. Previously 6 mm. Mild intrahepatic biliary dilatation identified. Within the distal common bile duct there is a stone measuring 8 mm. Pancreas: No mass, inflammatory changes, or other parenchymal abnormality identified. Spleen:  Within normal limits in size and appearance. Adrenals/Urinary Tract: The adrenal glands are normal. No suspicious kidney abnormality. No mass or hydronephrosis. Stomach/Bowel: The stomach appears normal. No abnormal dilatation of the upper abdominal bowel loops . Vascular/Lymphatic: The abdominal aorta appears normal. No adenopathy identified. Other:  None. Musculoskeletal: No suspicious bone lesions identified. IMPRESSION: 1. Stone within the distal common bile duct measures 8 mm. There is a progressive increase caliber of the common bile duct with intrahepatic biliary dilatation identified. 2. Previous cholecystectomy. These results will be called to the ordering clinician or representative by the Radiologist Assistant, and communication documented in the PACS or zVision Dashboard. Electronically Signed   By: Kerby Moors M.D.   On: 04/05/2016 10:10   Mr Abdomen Mrcp Moise Boring Contast  Result Date: 04/05/2016 CLINICAL DATA:  Abdominal pain for 2 months.  Previous cholecystectomy. Evaluate for choledocholithiasis. EXAM: MRI ABDOMEN WITHOUT AND WITH CONTRAST (INCLUDING MRCP) TECHNIQUE: Multiplanar multisequence MR imaging of the abdomen was performed both before and after the administration of intravenous contrast. Heavily T2-weighted images of the biliary and pancreatic ducts were obtained, and three-dimensional MRCP images were rendered by post processing. CONTRAST:  4mL MULTIHANCE GADOBENATE DIMEGLUMINE 529 MG/ML IV SOLN COMPARISON:  01/11/2016 FINDINGS: Lower chest: No acute findings. Hepatobiliary: No mass or other parenchymal abnormality identified. Increase caliber of the common bile duct which measures 1 cm in diameter. Previously 6 mm. Mild intrahepatic biliary dilatation identified. Within the distal common bile duct there is a stone measuring 8 mm. Pancreas: No mass, inflammatory changes, or other parenchymal abnormality identified. Spleen:  Within normal limits in size and appearance. Adrenals/Urinary Tract: The adrenal glands are normal. No suspicious kidney abnormality. No mass or hydronephrosis. Stomach/Bowel: The stomach appears normal. No abnormal dilatation of the upper abdominal bowel loops . Vascular/Lymphatic: The abdominal aorta appears normal. No adenopathy identified. Other:  None. Musculoskeletal: No suspicious bone lesions identified. IMPRESSION: 1. Stone within the distal common bile duct measures 8 mm. There is a progressive increase caliber of the common bile duct with intrahepatic biliary dilatation identified. 2. Previous cholecystectomy. These results will be called to the ordering clinician or representative by the Radiologist Assistant, and communication documented in the PACS or zVision Dashboard. Electronically Signed   By: Kerby Moors M.D.   On: 04/05/2016 10:10    (Echo, Carotid, EGD, Colonoscopy, ERCP)    Subjective:   Discharge Exam: Vitals:   04/05/16 2000 04/06/16 0510  BP:  (!) 145/78  Pulse:  85  Resp:  15   Temp: 97.2 F (36.2 C) 97.8 F (36.6 C)   Vitals:   04/05/16 1715 04/05/16 1729 04/05/16 2000 04/06/16 0510  BP: 129/86 (!) 146/93  (!) 145/78  Pulse: 71 87  85  Resp: 13 16  15   Temp: 98 F (36.7 C) 97.9 F (36.6 C) 97.2 F (36.2 C) 97.8 F (36.6 C)  TempSrc:  Oral Oral  SpO2: 95% 100%  99%  Weight:      Height:        General: Pt is alert, awake, not in acute distress Cardiovascular: RRR, S1/S2 +, no rubs, no gallops Respiratory: CTA bilaterally, no wheezing, no rhonchi Abdominal: Soft, NT, ND, bowel sounds + Extremities: no edema, no cyanosis    The results of significant diagnostics from this hospitalization (including imaging, microbiology, ancillary and laboratory) are listed below for reference.     Microbiology: Recent Results (from the past 240 hour(s))  MRSA PCR Screening     Status: None   Collection Time: 04/04/16 11:45 PM  Result Value Ref Range Status   MRSA by PCR NEGATIVE NEGATIVE Final    Comment:        The GeneXpert MRSA Assay (FDA approved for NASAL specimens only), is one component of a comprehensive MRSA colonization surveillance program. It is not intended to diagnose MRSA infection nor to guide or monitor treatment for MRSA infections.      Labs: BNP (last 3 results) No results for input(s): BNP in the last 8760 hours. Basic Metabolic Panel:  Recent Labs Lab 04/04/16 2017 04/05/16 0442 04/06/16 0523  NA 134* 136 135  K 3.1* 3.8 3.3*  CL 101 106 103  CO2 21* 25 26  GLUCOSE 119* 98 83  BUN 10 7 6   CREATININE 0.89 0.75 0.71  CALCIUM 9.6 8.1* 8.6*   Liver Function Tests:  Recent Labs Lab 04/03/16 1613 04/04/16 2017 04/05/16 0442 04/06/16 0523  AST 50* 93* 92* 50*  ALT 50* 100* 95* 84*  ALKPHOS 83 133* 98 111  BILITOT 1.0 2.1* 2.4* 1.0  PROT 6.6 8.2* 5.7* 6.1*  ALBUMIN 4.1 4.5 3.2* 3.3*    Recent Labs Lab 04/03/16 1613 04/04/16 2017 04/05/16 0442 04/06/16 0523  LIPASE 25 24 19   --   AMYLASE  --   --   --   78    Recent Labs Lab 04/04/16 2017  AMMONIA 13   CBC:  Recent Labs Lab 04/04/16 2017 04/05/16 0442 04/06/16 0523  WBC 8.8 6.8 4.6  NEUTROABS 7.4  --   --   HGB 15.2* 11.9* 12.0  HCT 41.9 34.1* 34.9*  MCV 91.3 91.9 93.6  PLT 219 178 183   Cardiac Enzymes: No results for input(s): CKTOTAL, CKMB, CKMBINDEX, TROPONINI in the last 168 hours. BNP: Invalid input(s): POCBNP CBG: No results for input(s): GLUCAP in the last 168 hours. D-Dimer No results for input(s): DDIMER in the last 72 hours. Hgb A1c No results for input(s): HGBA1C in the last 72 hours. Lipid Profile No results for input(s): CHOL, HDL, LDLCALC, TRIG, CHOLHDL, LDLDIRECT in the last 72 hours. Thyroid function studies No results for input(s): TSH, T4TOTAL, T3FREE, THYROIDAB in the last 72 hours.  Invalid input(s): FREET3 Anemia work up No results for input(s): VITAMINB12, FOLATE, FERRITIN, TIBC, IRON, RETICCTPCT in the last 72 hours. Urinalysis    Component Value Date/Time   COLORURINE YELLOW 04/04/2016 [place] 04/04/2016 2315   LABSPEC 1.010 04/04/2016 2315   PHURINE 6.0 04/04/2016 2315   GLUCOSEU NEGATIVE 04/04/2016 2315   HGBUR MODERATE (A) 04/04/2016 2315   BILIRUBINUR NEGATIVE 04/04/2016 2315   KETONESUR TRACE (A) 04/04/2016 2315   PROTEINUR NEGATIVE 04/04/2016 2315   UROBILINOGEN 0.2 09/27/2012 1249   NITRITE NEGATIVE 04/04/2016 2315   LEUKOCYTESUR NEGATIVE 04/04/2016 2315   Sepsis Labs Invalid input(s): PROCALCITONIN,  WBC,  LACTICIDVEN Microbiology Recent Results (from the past 240  hour(s))  MRSA PCR Screening     Status: None   Collection Time: 04/04/16 11:45 PM  Result Value Ref Range Status   MRSA by PCR NEGATIVE NEGATIVE Final    Comment:        The GeneXpert MRSA Assay (FDA approved for NASAL specimens only), is one component of a comprehensive MRSA colonization surveillance program. It is not intended to diagnose MRSA infection nor to guide or monitor  treatment for MRSA infections.      Time coordinating discharge: Over 30 minutes  SIGNED:   Birdie Hopes, MD  [name] 04/06/2016, 10:58 AM Pager   If 7PM-7AM, please contact night-coverage [email or web address] Password [email or web address]

## 2016-04-06 NOTE — Anesthesia Postprocedure Evaluation (Signed)
Anesthesia Post Note  Patient: BABBETTE WYSONG  Procedure(s) Performed: Procedure(s) (LRB): ENDOSCOPIC RETROGRADE CHOLANGIOPANCREATOGRAPHY (ERCP), STONE BASKET EXTRACTION (N/A) SPHINCTEROTOMY (N/A) PANCREATIC STENT PLACEMENT (N/A)  Patient location during evaluation: ICU Anesthesia Type: General Level of consciousness: awake and alert Pain management: pain level controlled Vital Signs Assessment: post-procedure vital signs reviewed and stable Respiratory status: spontaneous breathing Cardiovascular status: stable Anesthetic complications: no    Last Vitals:  Vitals:   04/05/16 2000 04/06/16 0510  BP:  (!) 145/78  Pulse:  85  Resp:  15  Temp: 36.2 C 36.6 C    Last Pain:  Vitals:   04/06/16 0510  TempSrc: Oral  PainSc:                  Drucie Opitz

## 2016-04-06 NOTE — Addendum Note (Signed)
Addendum  created 04/06/16 0827 by Vista Deck, CRNA   Sign clinical note

## 2016-04-06 NOTE — Progress Notes (Signed)
Being discharge to home. All IV access removed. Dressed herself. Giving all paper work and rolled to car by wheel chair.

## 2016-04-06 NOTE — Progress Notes (Addendum)
Patient ID: [name] [name], female   DOB: [birth year], 45 y.o.   MRN: [id number] States she feels good. No BM. No abdominal pain. Husband in room. Blood pressure (!) 145/78, pulse 85, temperature 97.8 F (36.6 C), temperature source Oral, resp. rate 15, height 5' 6.5" (1.689 m), weight 218 lb (98.9 kg), last menstrual period 02/11/2012, SpO2 99 %. Hepatic Function Latest Ref Rng & Units 04/06/2016 04/05/2016 04/04/2016  Total Protein 6.5 - 8.1 g/dL 6.1(L) 5.7(L) 8.2(H)  Albumin 3.5 - 5.0 g/dL 3.3(L) 3.2(L) 4.5  AST 15 - 41 U/L 50(H) 92(H) 93(H)  ALT 14 - 54 U/L 84(H) 95(H) 100(H)  Alk Phosphatase 38 - 126 U/L 111 98 133(H)  Total Bilirubin 0.3 - 1.2 mg/dL 1.0 2.4(H) 2.1(H)  Bilirubin, Direct 0.1 - 0.5 mg/dL - 1.5(H) -   Amylase    Component Value Date/Time   AMYLASE 78 04/06/2016 0523  CBD stone and underwent an ERCP yesterday by Dr. Laural Golden with stent placement. Plan: KUB today.   GI attending note: Patient has no complaints. She has good appetite. As above LFTs are coming down. KUB done in error. Stents not seen. There is contrast in the colon possibly overlying it. Unless patient assistance spontaneously she will need KUB in 10-14 days. Patient will have repeat LFTs when she is seen at [facility] week after next.

## 2016-04-07 NOTE — Telephone Encounter (Signed)
[facility] called with PA# [id number] for CPT [phone] that was done 04/04/16.

## 2016-04-10 ENCOUNTER — Ambulatory Visit: Payer: Commercial Managed Care - PPO | Admitting: Nurse Practitioner

## 2016-04-11 ENCOUNTER — Encounter (HOSPITAL_COMMUNITY): Payer: Self-pay | Admitting: Internal Medicine

## 2016-04-17 ENCOUNTER — Ambulatory Visit (HOSPITAL_COMMUNITY)
Admission: RE | Admit: 2016-04-17 | Discharge: 2016-04-17 | Disposition: A | Discharge status: Home / Self Care | Payer: Commercial Managed Care - PPO | Source: Ambulatory Visit | Attending: Gastroenterology | Admitting: Gastroenterology

## 2016-04-17 ENCOUNTER — Other Ambulatory Visit: Payer: Self-pay

## 2016-04-17 DIAGNOSIS — R7989 Other specified abnormal findings of blood chemistry: Secondary | ICD-10-CM | POA: Diagnosis present

## 2016-04-17 DIAGNOSIS — R945 Abnormal results of liver function studies: Secondary | ICD-10-CM

## 2016-04-18 ENCOUNTER — Encounter: Payer: Self-pay | Admitting: Nurse Practitioner

## 2016-04-18 ENCOUNTER — Ambulatory Visit (INDEPENDENT_AMBULATORY_CARE_PROVIDER_SITE_OTHER): Payer: Commercial Managed Care - PPO | Admitting: Nurse Practitioner

## 2016-04-18 VITALS — BP 133/90 | HR 105 | Temp 98.2°F | Ht 66.5 in | Wt 212.8 lb

## 2016-04-18 DIAGNOSIS — R1011 Right upper quadrant pain: Secondary | ICD-10-CM | POA: Diagnosis not present

## 2016-04-18 DIAGNOSIS — K8050 Calculus of bile duct without cholangitis or cholecystitis without obstruction: Secondary | ICD-10-CM

## 2016-04-18 DIAGNOSIS — R7989 Other specified abnormal findings of blood chemistry: Secondary | ICD-10-CM

## 2016-04-18 DIAGNOSIS — R945 Abnormal results of liver function studies: Secondary | ICD-10-CM

## 2016-04-18 NOTE — Patient Instructions (Signed)
1. Your labs drawn when you're able to. 2. I will order your x-ray for about 2 weeks from now. 3. We will call you with all results when receive them. 4. Return for follow-up in 3 months. 5. Call us if you have any worsening symptoms are returning symptoms.

## 2016-04-18 NOTE — Progress Notes (Signed)
Referring Provider: Celene Squibb, MD Primary Care Physician:  [name], MD Primary GI:  Dr. Gala Romney  Chief Complaint  Patient presents with  . Follow-up    had ERCP, no problems now, X-ray done yesterday    HPI:   [name] [name] is a 45 y.o. female who presents for posthospitalization follow-up on abdominal pain. The patient was last seen in our office 02/25/2016 for elevated LFTs. She has a history of choledocholithiasis status post ERCP with sphincterotomy and stone extraction on 01/13/2016. At the time of her last office visit she was doing quite well. Recommended recheck hepatic function panel and next colonoscopy age 75. Her LFTs showed completely normalized, CBC normal. She called on 04/03/2016 complaining of worsening symptoms similar to when she last had her ERCP. Labs were normal and she completed this on 04/04/2016 which found new biliary tract obstruction in the common bile duct or immediately before the level of the ampulla, recommended MRCP.  She was referred to the emergency room for consideration of admission. She was subsequently admitted from 04/04/2016 through 04/06/2016. MRCP completed which found an 8 mm common bile duct stone. ERCP was completed on 04/05/2016 with retrieval of 2 stones and placement of a pancreatic stent. She did quite well after her procedure with rapid decline in symptoms. Recommended KUB in 10-14 days. KUB was completed 04/17/2016 which found no ureteral stent. Requested re-read and Radiologist noted no stent seen on KUD, but should order KUB and upright.  Today she states she's feeling well overall. Denies abdominal pain, N/V, yellowing skin/eyes, darkened urine, hematochezia, melena, acute changes in bwoel habits, unintentional weight loss. Denies chest pain, dyspnea, dizziness, lightheadedness, syncope, near syncope. Denies any other upper or lower GI symptoms.  Past Medical History:  Diagnosis Date  . Anemia    As a child and while pregnant  .  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 Choledocholithiasis 04/05/2016   s/p ERCP, sphincterotomy, and stent placement  . Depression    parents died close together "was hard"  . Dyspnea    all the time .".due to the mass"  . Eczema   . Fibromyalgia   . Headache(784.0)    Birth control pills help  . Hemorrhoid   . History of radiation therapy 10/28/12-11/20/12   chest & neck,34Gy/32f  . Hypertension    recently dx with hodgkins  . Restless leg   . S/P cholecystectomy 09/14/2013   08/29/2013 by Dr. JArnoldo Morale  Negative pathology  . Sleep apnea    Sleep Study- 1996- "little apnea- no tx"; recent study showed the same in 2017    Past Surgical History:  Procedure Laterality Date  . BONE MARROW BIOPSY  03/20/2012  . CARDIOVASCULAR STRESS TEST  11/2015  . CHOLECYSTECTOMY N/A 08/29/2013   Procedure: LAPAROSCOPIC CHOLECYSTECTOMY;  Surgeon: MJamesetta So MD;  Location: [facility];  Service: General;  Laterality: N/A;  . DILATION AND CURETTAGE OF UTERUS  1991  . ERCP N/A 01/12/2016   Procedure: ENDOSCOPIC RETROGRADE CHOLANGIOPANCREATOGRAPHY (ERCP);  Surgeon: SDanie Binder MD;  Location: [facility];  Service: Endoscopy;  Laterality: N/A;  . ERCP N/A 04/05/2016   Procedure: ENDOSCOPIC RETROGRADE CHOLANGIOPANCREATOGRAPHY (ERCP), STONE BASKET EXTRACTION;  Surgeon: NRogene Houston MD;  Location: [facility];  Service: Endoscopy;  Laterality: N/A;  . Mediastinal Mass Biopsy  03/05/12  . PANCREATIC STENT PLACEMENT N/A 04/05/2016   Procedure: PANCREATIC STENT PLACEMENT;  Surgeon: NRogene Houston MD;  Location: [facility];  Service: Endoscopy;  Laterality: N/A;  . PORT-A-CATH REMOVAL Left 10/16/2012   Procedure: REMOVAL PORT-A-CATH;  Surgeon: Melrose Nakayama, MD;  Location: [facility];  Service: Thoracic;  Laterality: Left;  . PORTACATH PLACEMENT  03/26/2012   Procedure: INSERTION PORT-A-CATH;  Surgeon: Melrose Nakayama, MD;  Location: [facility];  Service: Thoracic;   Laterality: N/A;  . REMOVAL OF STONES N/A 01/12/2016   Procedure: REMOVAL OF STONES;  Surgeon: Danie Binder, MD;  [facility]: [facility];  Service: Endoscopy;  Laterality: N/A;  . SPHINCTEROTOMY N/A 01/12/2016   Procedure: SPHINCTEROTOMY;  Surgeon: Danie Binder, MD;  [facility];  Service: Endoscopy;  Laterality: N/A;  . SPHINCTEROTOMY N/A 04/05/2016   Procedure: SPHINCTEROTOMY;  Surgeon: Rogene Houston, MD;  [facility]: [facility];  Service: Endoscopy;  Laterality: N/A;  . WISDOM TOOTH EXTRACTION      Current Outpatient Prescriptions  Medication Sig Dispense Refill  . cyclobenzaprine (FLEXERIL) 10 MG tablet Take 10 mg by mouth at bedtime.    . Ibuprofen-Diphenhydramine Cit (ADVIL PM PO) Take 2 tablets by mouth at bedtime as needed (pain/sleep).     Lenda Kelp FE 1/20 1-20 MG-MCG tablet Take 1 tablet by mouth daily.    Marland Kitchen levothyroxine (SYNTHROID, LEVOTHROID) 125 MCG tablet Take 125 mcg by mouth daily before breakfast.    . piroxicam (FELDENE) 20 MG capsule Take 1 capsule by mouth daily as needed (plantar facitis.).     Marland Kitchen temazepam (RESTORIL) 15 MG capsule Take 15 mg by mouth at bedtime.      No current facility-administered medications for this visit.    Facility-Administered Medications Ordered in Other Visits  Medication Dose Route Frequency Provider Last Rate Last Dose  . sodium chloride 0.9 % injection 10 mL  10 mL Intravenous PRN Everardo All, MD   10 mL at 10/03/12 1643    Allergies as of 04/18/2016 - Review Complete 04/18/2016  Allergen Reaction Noted  . Other Other (See Comments) 10/09/2012  . Sulfur Anaphylaxis 09/14/2010    Family History  Problem Relation Age of Onset  . Cancer Paternal Uncle   . Cancer Maternal Grandmother     lung ca mets to throat  . Colon cancer Neg Hx     Social History   Social History  . Marital status: Married    Spouse name: N/A  . Number of children: N/A  . Years of education: N/A   Occupational History  . product  developer    Social History Main Topics  . Smoking status: Never Smoker  . Smokeless tobacco: Never Used  . Alcohol use 0.0 oz/week    0 drink(s) per week     Comment: occasionaly 2 x month; rarely  . Drug use: No  . Sexual activity: Yes    Birth control/ protection: Pill   Other Topics Concern  . 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 Lives in [place] with husband.     Review of Systems: General: Negative for anorexia, weight loss, fever, chills, fatigue, weakness. ENT: Negative for hoarseness, difficulty swallowing. CV: Negative for chest pain, angina, palpitations, peripheral edema.  Respiratory: Negative for dyspnea at rest, cough, sputum, wheezing.  GI: See history of present illness. Endo: Negative for unusual weight change.  Heme: Negative for bruising or bleeding. Allergy: Negative for rash or hives.   Physical Exam: BP 133/90   Pulse (!) 105   Temp 98.2 F (36.8  C) (Oral)   Ht 5' 6.5" (1.689 m)   Wt 212 lb 12.8 oz (96.5 kg)   LMP 02/11/2012 Comment: no period (chemo)  BMI 33.83 kg/m  General:   Alert and oriented. Pleasant and cooperative. Well-nourished and well-developed.  Eyes:  Without icterus, sclera clear and conjunctiva pink.  Ears:  Normal auditory acuity. Cardiovascular:  S1, S2 present without murmurs appreciated. Extremities without clubbing or edema. Respiratory:  Clear to auscultation bilaterally. No wheezes, rales, or rhonchi. No distress.  Gastrointestinal:  +BS, soft, non-tender and non-distended. No HSM noted. No guarding or rebound. No masses appreciated.  Rectal:  Deferred  Musculoskalatal:  Symmetrical without gross deformities. Neurologic:  Alert and oriented x4;  grossly normal neurologically. Psych:  Alert and cooperative. Normal mood and affect. Heme/Lymph/Immune: No excessive bruising noted.    04/18/2016 9:14 AM   Disclaimer: This note was dictated with voice recognition software.  Similar sounding words can inadvertently be transcribed and may not be corrected upon review.

## 2016-04-18 NOTE — Assessment & Plan Note (Signed)
Symptoms resolved after stone extraction and pancreatic stent placement. Further management as per above. Continue to monitor. Return for follow-up in 3 months.

## 2016-04-18 NOTE — Assessment & Plan Note (Signed)
She is status post common bile duct obstruction from gallstones. Underwent ERCP, sphincterotomy, stone extraction, and pancreatic stent placement. She is generally asymptomatic from a GI standpoint at this time. We will order a KUB and upright in about 2 weeks to follow up and ensure that the stent has passed. If it has not passed, then we will need to schedule endoscopy for retrieval. I will also order a hepatic function panel and CBC to trend enzymes back to normal. Return for follow-up in 3 months.

## 2016-04-18 NOTE — Progress Notes (Signed)
cc'ed to pcp °

## 2016-04-18 NOTE — Assessment & Plan Note (Signed)
LFTs remained elevated on discharge day. I will order a hepatic function panel in order to trend her enzymes back to normal. Generally asymptomatic from a GI standpoint at this time. Further management as per above.

## 2016-04-20 ENCOUNTER — Telehealth: Payer: Self-pay | Admitting: Nurse Practitioner

## 2016-04-20 DIAGNOSIS — K851 Biliary acute pancreatitis without necrosis or infection: Secondary | ICD-10-CM

## 2016-04-20 NOTE — Telephone Encounter (Signed)
Please notify the patient that I put in the abdominal XRay (2 view abdomen) for 05/02/16 to verify if her stent is still in place and is needing retrieval OR if it has spontaneously passed (as often happens). Call if any questions.

## 2016-04-21 NOTE — Telephone Encounter (Signed)
Tried to call pt- NA-LMOM with date of xray. I have also sent her a message via mychart.

## 2016-05-02 ENCOUNTER — Ambulatory Visit (HOSPITAL_COMMUNITY)
Admission: RE | Admit: 2016-05-02 | Discharge: 2016-05-02 | Disposition: A | Discharge status: Home / Self Care | Payer: Commercial Managed Care - PPO | Source: Ambulatory Visit | Attending: Nurse Practitioner | Admitting: Nurse Practitioner

## 2016-05-02 DIAGNOSIS — K851 Biliary acute pancreatitis without necrosis or infection: Secondary | ICD-10-CM | POA: Insufficient documentation

## 2016-06-01 ENCOUNTER — Telehealth (HOSPITAL_COMMUNITY): Payer: Self-pay | Admitting: Vascular Surgery

## 2016-06-01 NOTE — Telephone Encounter (Signed)
Left pt message to make f/u appt 

## 2016-06-28 ENCOUNTER — Other Ambulatory Visit: Payer: Self-pay | Admitting: Nurse Practitioner

## 2016-07-19 ENCOUNTER — Ambulatory Visit: Payer: Commercial Managed Care - PPO | Admitting: Nurse Practitioner

## 2016-07-21 NOTE — Addendum Note (Signed)
Encounter addended by: Jolaine Artist, MD on: 07/21/2016 10:02 PM<BR>    Actions taken: Visit diagnoses modified, Sign clinical note

## 2016-07-21 NOTE — Addendum Note (Signed)
Encounter addended by: Jolaine Artist, MD on: 07/21/2016 10:04 PM<BR>    Actions taken: Problem List modified

## 2016-09-20 ENCOUNTER — Ambulatory Visit (HOSPITAL_COMMUNITY): Payer: Commercial Managed Care - PPO | Admitting: Oncology

## 2016-09-21 ENCOUNTER — Encounter (HOSPITAL_COMMUNITY): Payer: Commercial Managed Care - PPO | Attending: Oncology | Admitting: Oncology

## 2016-09-21 ENCOUNTER — Encounter (HOSPITAL_COMMUNITY): Payer: Self-pay

## 2016-09-21 VITALS — BP 165/95 | HR 90 | Temp 98.1°F | Resp 16 | Wt 223.4 lb

## 2016-09-21 DIAGNOSIS — C8118 Nodular sclerosis classical Hodgkin lymphoma, lymph nodes of multiple sites: Secondary | ICD-10-CM | POA: Diagnosis not present

## 2016-09-21 DIAGNOSIS — C8110 Nodular sclerosis classical Hodgkin lymphoma, unspecified site: Secondary | ICD-10-CM

## 2016-09-21 NOTE — Progress Notes (Signed)
[name], MD [address] [place] [address]  No diagnosis found.  CURRENT THERAPY: Surveillance per NCCN guidelines  INTERVAL HISTORY: PURA PICINICH 46 y.o. 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 She was then switched to dose adjusted BEACOPP x2 cycles. Her PET scan after the second cycle showed complete remission. Because of probable inflammatory changes in the lungs and a prior Citrobacter blood stream/pulmonary infection felt to be the cause of the PET scan findings in the lungs she has had Levaquin which she is now finished. Now S/P involved field radiation by Dr. Thea Silversmith ([place]) finishing on 11/20/2012.  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 She was then switched to dose adjusted BEACOPP x2 cycles. Her PET scan after the second cycle showed complete remission. Because of probable inflammatory changes in the lungs and a prior Citrobacter blood stream/pulmonary infection felt to be the cause of the PET scan findings in the lungs she has had Levaquin which she is now finished. Now S/P involved field radiation by Dr. Thea Silversmith ([place]) finishing on 11/20/2012.     Hodgkin's lymphoma ([place])   09/14/2010 Imaging    Chest xray demonstrating- Possible mediastinal mass as discussed above.  Recommend chest CT scan for further evaluation.  Further work-up by ordering physician never performed      02/26/2012 Imaging    CT of chest- Large anterior  mediastinal mass is identified and is worrisome for tumor.  This is encasing the great vessels and S causing significant narrowing of the left subclavian vein.      03/05/2012 Bone Marrow Biopsy    Negative      03/05/2012 Initial Diagnosis    Anterior mediastinal mass biopsy- Classical Hodgkin's lymphoma      03/13/2012 Imaging    PET scan- hypermetabolic anterior mediastinal mass, extension of dominant mass in the low left neck, isolated small right lower jugular hypermetabolic node, no subdiaphragmatic disease.      03/27/2012 - 08/07/2012 Chemotherapy    ABVD x 5 cycles but with incomplete response evaluated after cycle 3 and after cycle #5.      06/20/2012 Imaging    PET scan- interval response to therapy as evidenced by decrease in size and metabolic activity involving the anterior mediastinal mass      08/16/2012 Imaging    PET scan- The anterior mediastinal soft tissue mass shows no interval change in size and there is a persistent small peripheral focus of hypermetabolism within this lesion on today's exam.      08/28/2012 - 09/25/2012 Chemotherapy    BEACOPP x 2 cycles      10/08/2012 Imaging    PET scan- Today's study demonstrates a positive response to therapy with decrease in size and resolution of hypermetabolism within the anterior mediastinal nodal mass.  The mass currently measures 6.2 x 2.5 cm.      10/09/2012 Remission         10/23/2012 - 11/20/2012 Radiation Therapy  Dr. Pablo Ledger      12/02/2012 Adverse Reaction    Radiation recall- negative work-up.  Treated with Prednisone.      05/26/2013 Imaging    PET scan- No substantial change in size of the anterior mediastinal mass although there is no hypermetabolism within this lesion on the current study.  Interval decrease in spleen size.      11/12/2013 Imaging    CT CAP- decrease size in medistinal mass.  NED.      02/09/2014 PET scan    Persistent soft tissue within the anterior mediastinum with very low  level metabolic activity (Deauville Criteria 2). Findings likely reflect treated tumor.       06/11/2014 Mammogram    Negative      07/01/2014 Imaging    Xray of orbits- No evidence of metallic foreign body within the orbits.      08/27/2015 Imaging    CT C/A/P Further decreased size of sessile appearing nodal lesion in the anterior mediastinum compared to prior study. no findings to suggest recurrent disease in the chest abdomen or pelvis       Mrs. Dupree presents today for continuing follow up. I personally reviewed and went over scans with the patient.  She reports dyspnea on exertion which is chronic. She states that she has not had any changes in symptoms or new symptoms since the last 4 years.  Denies unexplained weight loss, drenching night sweats, fever, chills, chest pain, abdominal pain,  weakness in arms/legs, headaches.     Review of Systems  Constitutional: Negative.  Negative for chills, diaphoresis, fever and weight loss.  HENT: Negative.   Eyes: Negative.   Respiratory: Positive for shortness of breath (chronic).   Cardiovascular: Negative.  Negative for chest pain.  Gastrointestinal: Negative.  Negative for abdominal pain.  Genitourinary: Negative.   Musculoskeletal: Negative.   Skin: Negative.   Neurological: Negative.  Negative for weakness and headaches.  Endo/Heme/Allergies: Negative.   Psychiatric/Behavioral: Negative.   All other systems reviewed and are negative.   Past Medical History:  Diagnosis Date  . Anemia    As a child and while pregnant  . Cancer ([place])    IIA Hodgkin's disease classic nodular sclerosing type  . Choledocholithiasis 04/05/2016   s/p ERCP, sphincterotomy, and stent placement  . Depression    parents died close together "was hard"  . Dyspnea    all the time .".due to the mass"  . Eczema   . Fibromyalgia   . Headache(784.0)    Birth control pills help  . Hemorrhoid   . History of radiation therapy 10/28/12-11/20/12    chest & neck,34Gy/95f  . Hypertension    recently dx with hodgkins  . Restless leg   . S/P cholecystectomy 09/14/2013   08/29/2013 by Dr. JArnoldo Morale  Negative pathology  . Sleep apnea    Sleep Study- 1996- "little apnea- no tx"; recent study showed the same in 2017    Past Surgical History:  Procedure Laterality Date  . BONE MARROW BIOPSY  03/20/2012  . CARDIOVASCULAR STRESS TEST  11/2015  . CHOLECYSTECTOMY N/A 08/29/2013   Procedure: LAPAROSCOPIC CHOLECYSTECTOMY;  Surgeon: MJamesetta So MD;  Location: [facility];  Service: General;  Laterality: N/A;  . DILATION AND CURETTAGE OF UTERUS  1991  . ERCP N/A 01/12/2016   Procedure: ENDOSCOPIC RETROGRADE CHOLANGIOPANCREATOGRAPHY (ERCP);  Surgeon: SDanie Binder MD;  Location: [facility];  Service: Endoscopy;  Laterality: N/A;  . ERCP N/A 04/05/2016   Procedure: ENDOSCOPIC  RETROGRADE CHOLANGIOPANCREATOGRAPHY (ERCP), STONE BASKET EXTRACTION;  Surgeon: Rogene Houston, MD;  Location[facility];  Service: Endoscopy;  Laterality: N/A;  . Mediastinal Mass Biopsy  03/05/12  . PANCREATIC STENT PLACEMENT N/A 04/05/2016   Procedure: PANCREATIC STENT PLACEMENT;  Surgeon: Rogene Houston, MD;  Location[facility];  Service: Endoscopy;  Laterality: N/A;  . PORT-A-CATH REMOVAL Left 10/16/2012   Procedure: REMOVAL PORT-A-CATH;  Surgeon: Melrose Nakayama, MD;  Location: [facility];  Service: Thoracic;  Laterality: Left;  . PORTACATH PLACEMENT  03/26/2012   Procedure: INSERTION PORT-A-CATH;  Surgeon: Melrose Nakayama, MD;  Location: [facility];  Service: Thoracic;  Laterality: N/A;  . REMOVAL OF STONES N/A 01/12/2016   Procedure: REMOVAL OF STONES;  Surgeon: Danie Binder, MD;  [facility];  Service: Endoscopy;  Laterality: N/A;  . SPHINCTEROTOMY N/A 01/12/2016   Procedure: SPHINCTEROTOMY;  Surgeon: Danie Binder, MD;  [facility];  Service: Endoscopy;  Laterality: N/A;  . SPHINCTEROTOMY N/A 04/05/2016   Procedure: SPHINCTEROTOMY;   Surgeon: Rogene Houston, MD;  [facility];  Service: Endoscopy;  Laterality: N/A;  . WISDOM TOOTH EXTRACTION      Family History  Problem Relation Age of Onset  . Cancer Paternal Uncle   . Cancer Maternal Grandmother     lung ca mets to throat  . Colon cancer Neg Hx     Social History   Social History  . Marital status: Married    Spouse name: N/A  . Number of children: N/A  . Years of education: N/A   Occupational History  . product developer    Social History Main Topics  . Smoking status: Never Smoker  . Smokeless tobacco: Never Used  . Alcohol use 0.0 oz/week    0 drink(s) per week     Comment: occasionaly 2 x month; rarely  . Drug use: No  . Sexual activity: Yes    Birth control/ protection: Pill   Other Topics Concern  . 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      PHYSICAL EXAMINATION  ECOG PERFORMANCE STATUS: 1 - Symptomatic but completely ambulatory Vitals:   09/21/16 1430  BP: (!) 165/95  Pulse: 90  Resp: 16  Temp: 98.1 F (36.7 C)    Physical Exam  Constitutional: She is oriented to person, place, and time and well-developed, well-nourished, and in no distress. No distress.  HENT:  Head: Normocephalic and atraumatic.  Mouth/Throat: No oropharyngeal exudate.  Eyes: Conjunctivae and EOM are normal. Pupils are equal, round, and reactive to light. No scleral icterus.  Neck: Normal range of motion. Neck supple. No JVD present.  Cardiovascular: Normal rate, regular rhythm and normal heart sounds.  Exam reveals no gallop and no friction rub.   No murmur heard. Pulmonary/Chest: Effort normal and breath sounds normal. No respiratory distress. She has no wheezes. She has no rales.  Abdominal: Soft. Bowel sounds are normal. She exhibits no distension. There is no tenderness. There is no guarding.  Musculoskeletal: Normal range of motion. She exhibits no edema or  tenderness.  Lymphadenopathy:    She has no cervical adenopathy.    She has no axillary adenopathy.       Right: No supraclavicular adenopathy present.       Left: No supraclavicular adenopathy present.  Neurological: She is alert and oriented to person, place, and time. No cranial  nerve deficit. Gait normal.  Skin: Skin is warm and dry. No rash noted. No erythema. No pallor.  Psychiatric: Affect and judgment normal.  Nursing note and vitals reviewed.   LABORATORY DATA: CBC        PENDING LABS:   RADIOGRAPHIC STUDIES: I have personally reviewed the radiological images as listed and agreed with the findings in the report. No results found.   PATHOLOGY:    ASSESSMENT AND PLAN:  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 She was then switched to dose adjusted BEACOPP x2 cycles. Her PET scan after the second cycle showed complete remission. Because of probable inflammatory changes in the lungs and a prior Citrobacter blood stream/pulmonary infection felt to be the cause of the PET scan findings in the lungs she has had Levaquin which she is now finished. Now S/P involved field radiation by Dr. Thea Silversmith ([place]) finishing on 11/20/2012.    PLAN: Clinically NED on exam today.  Patient is stable and asymptomatic.  RTC in 6 months with repeat labs. She knows to inform the clinic ASAP if she develops any symptoms such as drenching night sweats, unexplained weight loss, fever, and chills.   Orders Placed This Encounter  Procedures  . CBC with Differential    Standing Status:   Future    Standing Expiration Date:   05/23/2017  . Comprehensive metabolic panel    Standing Status:   Future    Standing Expiration Date:   05/23/2017  . Lactate  dehydrogenase    Standing Status:   Future    Standing Expiration Date:   05/23/2017  . TSH    Standing Status:   Future    Standing Expiration Date:   05/23/2017    THERAPY PLAN:  NCCN guidelines for surveillance of Hodgkin's Lymphoma (Age >/= 59) (1.2017):  A. Complete remission should be documented including reversion of PET to "negative" within 3 months following completion of therapy.  B. It is recommended that the patient be provided with a treatment summary at the completion of his/her therapy, including details of radiation therapy, organs at risk, and cumulative anthracycline dosage given.  C. Follow-up with an oncologist is recommended, especially during the first 5 years after treatment to detect recurrence, and then annually due to the risk of late complications including second cancers and cardiovascular disease. Late relapse or transformation to large cell lymphoma may occur in [place].  D. Frequency and types of tests may vary depending on clinical circumstances: age and stage at diagnosis, social habits, treatment modality, etc. There are few data to support specific recommendations; these represent the range of practice at [facility].   1. Follow-up after completion of treatment up to 5 years:   A. Interim H and P: Every 3-6 months for 1-2 years, then every 6-12 months until year 3, then annually.   B. Annual influenza vaccine.   C. Laboratory studies:    1. CBC, platelets, ESR (if elevated at time of initial diagnosis), chemistry profile as clinically indicated.    2. Thyroid stimulating hormone (TSH) at least annually if radiation therapy to neck.   D. Acceptable to obtain a neck/chest/abdomen/pelvis CT scan with contrast, at 6, 12, and 24 month following completion of therapy, or as clinically indicated. PET/CT only if  last PET was Deauville 4-5, to confirm complete response.   E. Counseling: Reproduction, health habits, psychosocial, cardiovascular, breast  self-exam, skin cancer risk, end of treatment discussion.   F. Surveillance PET should not be done routinely due to risk of false positives. Management decision should not be based on PET scan alone; clinical or pathologic correlation is needed.   2. Follow-up and monitoring after 5 years:   A. Interim H&P: Annually    1. Annual blood pressure, aggressive management of cardiovascular risk factors.    2. Pneumococcal, meningococcal, and H-flu revaccination after 5-7 years, if patient treated with splenic radiation therapy or previous splenectomy (according to CBC recommendations).    3. Annual influenza vaccine.   B. Cardiovascular symptoms may emerge at a young age.    1. Consider stress test/echocardiogram at 10 year intervals after treatment is completed.    2. Consider carotid ultrasound at 10 year intervals if neck irradiation.   C. Laboratory studies:    1. CBC, platelets, chemistry profile annually    2. TSH at least annually if radiation therapy to neck    3. Biannual lipids    4. Annual fasting glucose   D. Annual breast screening: Initiate 8-10 years post therapy, or at age 72, whichever comes first, if chest or axillary radiation. The NCCN Hodgkin Lymphoma Guidelines Panel recommends breast MRI in addition to mammography for women who've received irradiation to the chest between ages 54-30 years, which is consistent with the [place] (ACS) Guidelines. Consider referral to a breast specialist.   E. Perform other routine surveillance tests for cervical, colorectal, endometrial, lung, and prostate cancer as per the ACS Cancer Screening Guidelines.   F. Counseling: Reproduction, health habits, psychosocial, cardiovascular, breast self-exam, and skin cancer risk.   G. Treatment summary and consideration of transfer to primary care provider.   H. Consider a referral to a survivorship clinic.     All questions were answered. The patient knows to call the clinic with any  problems, questions or concerns. We can certainly see the patient much sooner if necessary.  This document serves as a record of services personally performed by Twana First, MD. It was created on her behalf by Shirlean Mylar, a trained medical scribe. The creation of this record is based on the scribe's personal observations and the provider's statements to them. This document has been checked and approved by the attending provider.  I have reviewed the above documentation for accuracy and completeness and I agree with the above.  This note is electronically signed by: Mikey College 09/21/2016 2:51 PM

## 2016-09-21 NOTE — Patient Instructions (Signed)
[facility] at [facility] Discharge Instructions  RECOMMENDATIONS MADE BY THE CONSULTANT AND ANY TEST RESULTS WILL BE SENT TO YOUR REFERRING PHYSICIAN.  You were seen today by Dr. Louise Zhou Follow up in 6 months with lab work See Amy up front for appointments   Thank you for choosing [facility] at [facility] to provide your oncology and hematology care.  To afford each patient quality time with our provider, please arrive at least 15 minutes before your scheduled appointment time.    If you have a lab appointment with the Cancer Center please come in thru the  Main Entrance and check in at the main information desk  You need to re-schedule your appointment should you arrive 10 or more minutes late.  We strive to give you quality time with our providers, and arriving late affects you and other patients whose appointments are after yours.  Also, if you no show three or more times for appointments you may be dismissed from the clinic at the providers discretion.     Again, thank you for choosing [facility].  Our hope is that these requests will decrease the amount of time that you wait before being seen by our physicians.       _____________________________________________________________  Should you have questions after your visit to [facility], please contact our office at [phone] between the hours of 8:30 a.m. and 4:30 p.m.  Voicemails left after 4:30 p.m. will not be returned until the following business day.  For prescription refill requests, have your pharmacy contact our office.       Resources For Cancer Patients and their Caregivers ? American Cancer Society: Can assist with transportation, wigs, general needs, runs Look Good Feel Better.        1-888-227-6333 ? Cancer Care: Provides financial assistance, online support groups, medication/co-pay assistance.  1-800-813-HOPE (4673) ? [facility] Assists [place] cancer patients and their families through emotional , educational and financial support.  336-427-4357 ? [place] DSS Where to apply for food stamps, Medicaid and utility assistance. 336-342-1394 ? RCATS: Transportation to medical appointments. 336-347-2287 ? Social Security Administration: May apply for disability if have a Stage IV cancer. 336-342-7796 1-800-772-1213 ? [place] Aging, Disability and Transit Services: Assists with nutrition, care and transit needs. 336-349-2343  Cancer Center Support Programs: @10RELATIVEDAYS@ > Cancer Support Group  2nd Tuesday of the month 1pm-2pm, Journey Room  > Creative Journey  3rd Tuesday of the month 1130am-1pm, Journey Room  > Look Good Feel Better  1st Wednesday of the month 10am-12 noon, Journey Room (Call American Cancer Society to register 1-800-395-5775)    

## 2016-10-16 ENCOUNTER — Encounter (HOSPITAL_COMMUNITY): Payer: Self-pay | Admitting: Vascular Surgery

## 2016-10-30 ENCOUNTER — Telehealth (HOSPITAL_COMMUNITY): Payer: Self-pay | Admitting: Internal Medicine

## 2016-10-30 NOTE — Telephone Encounter (Signed)
error 

## 2016-10-31 ENCOUNTER — Telehealth (HOSPITAL_COMMUNITY): Payer: Self-pay | Admitting: *Deleted

## 2016-10-31 NOTE — Telephone Encounter (Signed)
Received fax from [facility], pt needs clearance to have: "removal of tooth #30 and bone grafting in prep for implant under deep IV sedation in the office"  Per Dr Haroldine Laws, "ok to proceed with dental treatment, no special precautions and no prophylactic antibiotics are needed"  Note faxed back to them at [phone]

## 2017-03-20 ENCOUNTER — Encounter (HOSPITAL_COMMUNITY): Payer: Commercial Managed Care - PPO | Attending: Oncology

## 2017-03-20 DIAGNOSIS — C8110 Nodular sclerosis classical Hodgkin lymphoma, unspecified site: Secondary | ICD-10-CM | POA: Diagnosis not present

## 2017-03-20 LAB — COMPREHENSIVE METABOLIC PANEL
ALT: 24 U/L (ref 14–54)
AST: 21 U/L (ref 15–41)
Albumin: 4.2 g/dL (ref 3.5–5.0)
Alkaline Phosphatase: 49 U/L (ref 38–126)
Anion gap: 11 (ref 5–15)
BUN: 17 mg/dL (ref 6–20)
CO2: 25 mmol/L (ref 22–32)
Calcium: 9.5 mg/dL (ref 8.9–10.3)
Chloride: 103 mmol/L (ref 101–111)
Creatinine, Ser: 0.84 mg/dL (ref 0.44–1.00)
GFR calc Af Amer: >60 mL/min (ref >60)
GFR calc non Af Amer: >60 mL/min (ref >60)
Glucose, Bld: 100 mg/dL — ABNORMAL HIGH (ref 65–99)
Potassium: 3.8 mmol/L (ref 3.5–5.1)
Sodium: 139 mmol/L (ref 135–145)
Total Bilirubin: 0.8 mg/dL (ref 0.3–1.2)
Total Protein: 7.5 g/dL (ref 6.5–8.1)

## 2017-03-20 LAB — CBC WITH DIFFERENTIAL/PLATELET
Basophils Absolute: 0 10*3/uL (ref 0.0–0.1)
Basophils Relative: 0 %
Eosinophils Absolute: 0 10*3/uL (ref 0.0–0.7)
Eosinophils Relative: 1 %
HCT: 39.1 % (ref 36.0–46.0)
Hemoglobin: 13.8 g/dL (ref 12.0–15.0)
Lymphocytes Relative: 18 %
Lymphs Abs: 1.1 10*3/uL (ref 0.7–4.0)
MCH: 32.5 pg (ref 26.0–34.0)
MCHC: 35.3 g/dL (ref 30.0–36.0)
MCV: 92 fL (ref 78.0–100.0)
Monocytes Absolute: 0.3 10*3/uL (ref 0.1–1.0)
Monocytes Relative: 5 %
Neutro Abs: 4.7 10*3/uL (ref 1.7–7.7)
Neutrophils Relative %: 76 %
Platelets: 196 10*3/uL (ref 150–400)
RBC: 4.25 MIL/uL (ref 3.87–5.11)
RDW: 12.3 % (ref 11.5–15.5)
WBC: 6.2 10*3/uL (ref 4.0–10.5)

## 2017-03-20 LAB — TSH: TSH: 1.204 u[IU]/mL (ref 0.350–4.500)

## 2017-03-20 LAB — LACTATE DEHYDROGENASE: LDH: 150 U/L (ref 98–192)

## 2017-03-23 ENCOUNTER — Encounter (HOSPITAL_COMMUNITY): Payer: Self-pay | Admitting: Hematology and Oncology

## 2017-03-23 ENCOUNTER — Encounter (HOSPITAL_COMMUNITY): Payer: Commercial Managed Care - PPO | Attending: Hematology and Oncology | Admitting: Hematology and Oncology

## 2017-03-23 VITALS — BP 149/94 | HR 91 | Temp 98.1°F | Resp 20 | Wt 205.9 lb

## 2017-03-23 DIAGNOSIS — C8110 Nodular sclerosis classical Hodgkin lymphoma, unspecified site: Secondary | ICD-10-CM

## 2017-03-23 NOTE — Patient Instructions (Signed)
[facility] at [facility] Discharge Instructions  RECOMMENDATIONS MADE BY THE CONSULTANT AND ANY TEST RESULTS WILL BE SENT TO YOUR REFERRING PHYSICIAN.  You were seen today by Dr. Grace Isaac  Thank you for choosing [facility] at [facility] to provide your oncology and hematology care.  To afford each patient quality time with our provider, please arrive at least 15 minutes before your scheduled appointment time.    If you have a lab appointment with the [facility] please come in thru the  Main Entrance and check in at the main information desk  You need to re-schedule your appointment should you arrive 10 or more minutes late.  We strive to give you quality time with our providers, and arriving late affects you and other patients whose appointments are after yours.  Also, if you no show three or more times for appointments you may be dismissed from the clinic at the providers discretion.     Again, thank you for choosing [facility].  Our hope is that these requests will decrease the amount of time that you wait before being seen by our physicians.       _____________________________________________________________  Should you have questions after your visit to [facility], please contact our office at [phone] between the hours of 8:30 a.m. and 4:30 p.m.  Voicemails left after 4:30 p.m. will not be returned until the following business day.  For prescription refill requests, have your pharmacy contact our office.       Resources For Cancer Patients and their Caregivers ? American Cancer Society: Can assist with transportation, wigs, general needs, runs Look Good Feel Better.        925-492-3163 ? Cancer Care: Provides financial assistance, online support groups, medication/co-pay assistance.  1-800-813-HOPE (224) 867-3535) ? [facility] Assists [place] cancer patients and their  families through emotional , educational and financial support.  860 513 6749 ? [place] DSS Where to apply for food stamps, Medicaid and utility assistance. (303)846-1533 ? RCATS: Transportation to medical appointments. 878 198 9404 ? Social Security Administration: May apply for disability if have a Stage IV cancer. (602) 214-8538 220 274 9373 ? [employer], Disability and Transit Services: Assists with nutrition, care and transit needs. [place] Support Programs: @10RELATIVEDAYS @ > Cancer Support Group  2nd Tuesday of the month 1pm-2pm, Journey Room  > Creative Journey  3rd Tuesday of the month 1130am-1pm, Journey Room  > Look Good Feel Better  1st Wednesday of the month 10am-12 noon, Journey Room (Call [place] to register [phone])

## 2017-03-29 NOTE — Assessment & Plan Note (Signed)
46 y.o. female with previous history of nodular-sclerosis Hodgkin's Lymphoma initially treated with ABVD with persistent diseasewhich required a salvage therapy with BEACOPP and IFRT resulting in complete response and no evidence of recurrence so far.  Patient's clinical evaluation lab work did not demonstrate any findings suggestive of disease recurrence at this point.  Plan: --RTC6 months: CT CAP, labs, clinic visit

## 2017-03-29 NOTE — Progress Notes (Signed)
[place] [facility] Follow-up Visit:  Assessment: Hodgkin's lymphoma [facility]) 46 y.o. female with previous history of nodular-sclerosis Hodgkin's Lymphoma initially treated with ABVD with persistent diseasewhich required a salvage therapy with BEACOPP and IFRT resulting in complete response and no evidence of recurrence so far.  Patient's clinical evaluation lab work did not demonstrate any findings suggestive of disease recurrence at this point.  Plan: --RTC6 months: CT CAP, labs, clinic visit  Voice recognition software was used and creation of this note. Despite my best effort at editing the text, some misspelling/errors may have occurred.   Orders Placed This Encounter  Procedures  . CT Abdomen Pelvis W Contrast    Standing Status:   Future    Standing Expiration Date:   03/23/2018    Order Specific Question:   If indicated for the ordered procedure, I authorize the administration of contrast media per Radiology protocol    Answer:   Yes    Order Specific Question:   Is patient pregnant?    Answer:   No    Order Specific Question:   Preferred imaging location?    Answer:   [facility]    Order Specific Question:   Radiology Contrast Protocol - do NOT remove file path    Answer:   \\[email or web address]    Order Specific Question:   Reason for Exam additional comments    Answer:   History of HL, please eval for recurrence  . CT Chest W Contrast    Standing Status:   Future    Standing Expiration Date:   03/23/2018    Order Specific Question:   If indicated for the ordered procedure, I authorize the administration of contrast media per Radiology protocol    Answer:   Yes    Order Specific Question:   Is patient pregnant?    Answer:   No    Order Specific Question:   Preferred imaging location?    Answer:   [facility]    Order Specific Question:   Radiology Contrast Protocol - do NOT remove file path    Answer:    \\[email or web address]    Order Specific Question:   Reason for Exam additional comments    Answer:   History of HL, please eval recurrence  . CBC with Differential    Standing Status:   Future    Standing Expiration Date:   03/23/2018  . Comprehensive metabolic panel    Standing Status:   Future    Standing Expiration Date:   03/23/2018  . Lactate dehydrogenase (LDH)    Standing Status:   Future    Standing Expiration Date:   03/23/2018  . Uric acid    Standing Status:   Future    Standing Expiration Date:   03/23/2018    Cancer Staging Hodgkin's lymphoma [facility]) Staging form: Lymphoid Neoplasms, AJCC 6th Edition - Clinical: Stage II - Signed by Baird Cancer, PA-C on 03/26/2013   All questions were answered.  . The patient knows to call the clinic with any problems, questions or concerns.  This note was electronically signed.    History of Presenting Illness [name] [name] 46 y.o. presenting to the [facility] for [facility] monitoring for diagnosis of nodular sclerosis Hodgkin lymphoma, Stage IIA bulky. Patient was treated with five cycles of ABVD followed by two cycles of BEACOPP resulting in complete response. Consolidation of ballfield radiotherapy was used and completed on 11/20/12. Patient has had no evidence of disease recurrence  since then. Her complaints that include chronic lower back pain, stable numbness in toes of bilateral feet, and fatigue that appears to be getting better. No new fevers, chills, night sweats. Non-expected weight loss. No new lymphadenopathy.  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 She was then switched to dose adjusted BEACOPP x2 cycles.  Her PET scan after the second cycle showed complete remission. Because of probable inflammatory changes in the lungs and a prior Citrobacter blood stream/pulmonary infection felt to be the cause of the PET scan findings in the lungs she has had Levaquin which she is now finished. Now S/P involved field radiation by Dr. Thea Silversmith ([place]) finishing on 11/20/2012.     Hodgkin's lymphoma ([place])   09/14/2010 Imaging    Chest xray demonstrating- Possible mediastinal mass as discussed above.  Recommend chest CT scan for further evaluation.  Further work-up by ordering physician never performed      02/26/2012 Imaging    CT of chest- Large anterior mediastinal mass is identified and is worrisome for tumor.  This is encasing the great vessels and S causing significant narrowing of the left subclavian vein.      03/05/2012 Bone Marrow Biopsy    Negative      03/05/2012 Initial Diagnosis    Anterior mediastinal mass biopsy- Classical Hodgkin's lymphoma      03/13/2012 Imaging    PET scan- hypermetabolic anterior mediastinal mass, extension of dominant mass in the low left neck, isolated small right lower jugular hypermetabolic node, no subdiaphragmatic disease.      03/27/2012 - 08/07/2012 Chemotherapy    ABVD x 5 cycles but with incomplete response evaluated after cycle 3 and after cycle #5.      06/20/2012 Imaging    PET scan- interval response to therapy as evidenced by decrease in size and metabolic activity involving the anterior mediastinal mass      08/16/2012 Imaging    PET scan- The anterior mediastinal soft tissue mass shows no interval change in size and there is a persistent small peripheral focus of hypermetabolism within this lesion on today's exam.      08/28/2012 - 09/25/2012 Chemotherapy    BEACOPP x 2 cycles      10/08/2012 Imaging    PET scan- Today's study demonstrates a positive response to therapy with decrease in size and resolution of hypermetabolism within the anterior  mediastinal nodal mass.  The mass currently measures 6.2 x 2.5 cm.      10/09/2012 Remission         10/23/2012 - 11/20/2012 Radiation Therapy    Dr. Pablo Ledger      12/02/2012 Adverse Reaction    Radiation recall- negative work-up.  Treated with Prednisone.      05/26/2013 Imaging    PET scan- No substantial change in size of the anterior mediastinal mass although there is no hypermetabolism within this lesion on the current study.  Interval decrease in spleen size.      11/12/2013 Imaging    CT CAP- decrease size in medistinal mass.  NED.      02/09/2014 PET scan    Persistent soft tissue within the anterior mediastinum with very low level metabolic activity (Deauville Criteria 2). Findings likely reflect treated tumor.  06/11/2014 Mammogram    Negative      07/01/2014 Imaging    Xray of orbits- No evidence of metallic foreign body within the orbits.      08/27/2015 Imaging    CT C/A/P Further decreased size of sessile appearing nodal lesion in the anterior mediastinum compared to prior study. no findings to suggest recurrent disease in the chest abdomen or pelvis       Medical History: Past Medical History:  Diagnosis Date  . Anemia    As a child and while pregnant  . Cancer ([place])    IIA Hodgkin's disease classic nodular sclerosing type  . Choledocholithiasis 04/05/2016   s/p ERCP, sphincterotomy, and stent placement  . Depression    parents died close together "was hard"  . Dyspnea    all the time .".due to the mass"  . Eczema   . Fibromyalgia   . Headache(784.0)    Birth control pills help  . Hemorrhoid   . History of radiation therapy 10/28/12-11/20/12   chest & neck,34Gy/69f  . Hypertension    recently dx with hodgkins  . Restless leg   . S/P cholecystectomy 09/14/2013   08/29/2013 by Dr. JArnoldo Morale  Negative pathology  . Sleep apnea    Sleep Study- 1996- "little apnea- no tx"; recent study showed the same in 2017    Surgical History: Past Surgical  History:  Procedure Laterality Date  . BONE MARROW BIOPSY  03/20/2012  . CARDIOVASCULAR STRESS TEST  11/2015  . CHOLECYSTECTOMY N/A 08/29/2013   Procedure: LAPAROSCOPIC CHOLECYSTECTOMY;  Surgeon: MJamesetta So MD;  Location: [facility];  Service: General;  Laterality: N/A;  . DILATION AND CURETTAGE OF UTERUS  1991  . ERCP N/A 01/12/2016   Procedure: ENDOSCOPIC RETROGRADE CHOLANGIOPANCREATOGRAPHY (ERCP);  Surgeon: SDanie Binder MD;  Location: [facility];  Service: Endoscopy;  Laterality: N/A;  . ERCP N/A 04/05/2016   Procedure: ENDOSCOPIC RETROGRADE CHOLANGIOPANCREATOGRAPHY (ERCP), STONE BASKET EXTRACTION;  Surgeon: NRogene Houston MD;  Location: [facility];  Service: Endoscopy;  Laterality: N/A;  . Mediastinal Mass Biopsy  03/05/12  . PANCREATIC STENT PLACEMENT N/A 04/05/2016   Procedure: PANCREATIC STENT PLACEMENT;  Surgeon: NRogene Houston MD;  Location: [facility];  Service: Endoscopy;  Laterality: N/A;  . PORT-A-CATH REMOVAL Left 10/16/2012   Procedure: REMOVAL PORT-A-CATH;  Surgeon: SMelrose Nakayama MD;  Location: [facility]  Service: Thoracic;  Laterality: Left;  . PORTACATH PLACEMENT  03/26/2012   Procedure: INSERTION PORT-A-CATH;  Surgeon: SMelrose Nakayama MD;  Location: [facility]  Service: Thoracic;  Laterality: N/A;  . REMOVAL OF STONES N/A 01/12/2016   Procedure: REMOVAL OF STONES;  Surgeon: SDanie Binder MD;  [facility];  Service: Endoscopy;  Laterality: N/A;  . SPHINCTEROTOMY N/A 01/12/2016   Procedure: SPHINCTEROTOMY;  Surgeon: SDanie Binder MD;  [facility]: [facility];  Service: Endoscopy;  Laterality: N/A;  . SPHINCTEROTOMY N/A 04/05/2016   Procedure: SPHINCTEROTOMY;  Surgeon: NRogene Houston MD;  [facility];  Service: Endoscopy;  Laterality: N/A;  . WISDOM TOOTH EXTRACTION      Family History: Family History  Problem Relation Age of Onset  . Cancer Paternal Uncle   . Cancer Maternal Grandmother        lung ca mets to throat  . Colon  cancer Neg Hx     Social History: Social History   Social History  . Marital status: Married    Spouse name: N/A  .  Number of children: N/A  . Years of education: N/A   Occupational History  . product developer    Social History Main Topics  . Smoking status: Never Smoker  . Smokeless tobacco: Never Used  . Alcohol use 0.0 oz/week    0 drink(s) per week     Comment: occasionaly 2 x month; rarely  . Drug use: No  . Sexual activity: Yes    Birth control/ protection: Pill   Other Topics Concern  . 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     Allergies: Allergies  Allergen Reactions  . Other Other (See Comments)    Patient can not have straight oxygen due to reaction with chemo medications.   . Sulfur Anaphylaxis    Medications:  Current Outpatient Prescriptions  Medication Sig Dispense Refill  . cyclobenzaprine (FLEXERIL) 10 MG tablet Take 10 mg by mouth at bedtime.    . Ibuprofen-Diphenhydramine Cit (ADVIL PM PO) Take 2 tablets by mouth at bedtime as needed (pain/sleep).     [name] [name] 1/20 1-20 MG-MCG tablet Take 1 tablet by mouth daily.    Marland Kitchen levothyroxine (SYNTHROID, LEVOTHROID) 125 MCG tablet Take 125 mcg by mouth daily before breakfast.    . piroxicam (FELDENE) 20 MG capsule Take 1 capsule by mouth daily as needed (plantar facitis.).     Marland Kitchen temazepam (RESTORIL) 15 MG capsule Take 15 mg by mouth at bedtime.      No current facility-administered medications for this visit.    Facility-Administered Medications Ordered in Other Visits  Medication Dose Route Frequency Provider Last Rate Last Dose  . sodium chloride 0.9 % injection 10 mL  10 mL Intravenous PRN Everardo All, MD   10 mL at 10/03/12 1643    Review of Systems: Review of Systems  Constitutional: Positive for fatigue.  Musculoskeletal: Positive for back pain.  Neurological: Positive for numbness.  All other systems  reviewed and are negative.    PHYSICAL EXAMINATION Blood pressure (!) 149/94, pulse 91, temperature 98.1 F (36.7 C), temperature source Oral, resp. rate 20, weight 205 lb 14.4 oz (93.4 kg), last menstrual period 02/11/2012, SpO2 98 %.  ECOG PERFORMANCE STATUS: 1 - Symptomatic but completely ambulatory  Physical Exam  Constitutional: She is oriented to person, place, and time and well-developed, well-nourished, and in no distress. No distress.  HENT:  Head: Normocephalic and atraumatic.  Mouth/Throat: No oropharyngeal exudate.  Eyes: Pupils are equal, round, and reactive to light. Conjunctivae and EOM are normal. No scleral icterus.  Neck: Normal range of motion. Neck supple. No JVD present.  Cardiovascular: Normal rate, regular rhythm and normal heart sounds.  Exam reveals no gallop and no friction rub.   No murmur heard. Pulmonary/Chest: Effort normal and breath sounds normal. No respiratory distress. She has no wheezes. She has no rales.  Abdominal: Soft. Bowel sounds are normal. She exhibits no distension. There is no tenderness. There is no guarding.  Musculoskeletal: Normal range of motion. She exhibits no edema or tenderness.  Lymphadenopathy:    She has no cervical adenopathy.    She has no axillary adenopathy.       Right: No supraclavicular adenopathy present.       Left: No supraclavicular adenopathy present.  Neurological: She is alert and oriented to person, place, and time. No cranial nerve deficit. Gait normal.  Skin: Skin is warm and dry. No rash noted. No erythema. No pallor.  Psychiatric: Affect and judgment normal.  Nursing note and vitals reviewed.    LABORATORY DATA: I have personally reviewed the data as listed: Appointment on 03/20/2017  Component Date Value Ref Range Status  . WBC 03/20/2017 6.2  4.0 - 10.5 K/uL Final  . RBC 03/20/2017 4.25  3.87 - 5.11 MIL/uL Final  . Hemoglobin 03/20/2017 13.8  12.0 - 15.0 g/dL Final  . HCT 03/20/2017 39.1  36.0 -  46.0 % Final  . MCV 03/20/2017 92.0  78.0 - 100.0 fL Final  . MCH 03/20/2017 32.5  26.0 - 34.0 pg Final  . MCHC 03/20/2017 35.3  30.0 - 36.0 g/dL Final  . RDW 03/20/2017 12.3  11.5 - 15.5 % Final  . Platelets 03/20/2017 196  150 - 400 K/uL Final  . Neutrophils Relative % 03/20/2017 76  % Final  . Neutro Abs 03/20/2017 4.7  1.7 - 7.7 K/uL Final  . Lymphocytes Relative 03/20/2017 18  % Final  . Lymphs Abs 03/20/2017 1.1  0.7 - 4.0 K/uL Final  . Monocytes Relative 03/20/2017 5  % Final  . Monocytes Absolute 03/20/2017 0.3  0.1 - 1.0 K/uL Final  . Eosinophils Relative 03/20/2017 1  % Final  . Eosinophils Absolute 03/20/2017 0.0  0.0 - 0.7 K/uL Final  . Basophils Relative 03/20/2017 0  % Final  . Basophils Absolute 03/20/2017 0.0  0.0 - 0.1 K/uL Final  . Sodium 03/20/2017 139  135 - 145 mmol/L Final  . Potassium 03/20/2017 3.8  3.5 - 5.1 mmol/L Final  . Chloride 03/20/2017 103  101 - 111 mmol/L Final  . CO2 03/20/2017 25  22 - 32 mmol/L Final  . Glucose, Bld 03/20/2017 100* 65 - 99 mg/dL Final  . BUN 03/20/2017 17  6 - 20 mg/dL Final  . Creatinine, Ser 03/20/2017 0.84  0.44 - 1.00 mg/dL Final  . Calcium 03/20/2017 9.5  8.9 - 10.3 mg/dL Final  . Total Protein 03/20/2017 7.5  6.5 - 8.1 g/dL Final  . Albumin 03/20/2017 4.2  3.5 - 5.0 g/dL Final  . AST 03/20/2017 21  15 - 41 U/L Final  . ALT 03/20/2017 24  14 - 54 U/L Final  . Alkaline Phosphatase 03/20/2017 49  38 - 126 U/L Final  . Total Bilirubin 03/20/2017 0.8  0.3 - 1.2 mg/dL Final  . GFR calc non Af Amer 03/20/2017 >60  >60 mL/min Final  . GFR calc Af Amer 03/20/2017 >60  >60 mL/min Final   Comment: (NOTE) The eGFR has been calculated using the CKD EPI equation. This calculation has not been validated in all clinical situations. eGFR's persistently <60 mL/min signify possible Chronic Kidney Disease.   . Anion gap 03/20/2017 11  5 - 15 Final  . LDH 03/20/2017 150  98 - 192 U/L Final  . TSH 03/20/2017 1.204  0.350 - 4.500 uIU/mL  Final   Performed by a 3rd Generation assay with a functional sensitivity of <=0.01 uIU/mL.       Ardath Sax, MD

## 2017-07-30 ENCOUNTER — Encounter (HOSPITAL_COMMUNITY): Payer: Self-pay | Admitting: Internal Medicine

## 2017-07-30 ENCOUNTER — Other Ambulatory Visit: Payer: Self-pay

## 2017-07-30 ENCOUNTER — Ambulatory Visit (HOSPITAL_COMMUNITY)
Admission: RE | Admit: 2017-07-30 | Discharge: 2017-07-30 | Disposition: A | Discharge status: Home / Self Care | Payer: Commercial Managed Care - PPO | Source: Ambulatory Visit | Attending: Internal Medicine | Admitting: Internal Medicine

## 2017-07-30 VITALS — BP 147/97 | HR 90 | Wt 208.0 lb

## 2017-07-30 DIAGNOSIS — Z01810 Encounter for preprocedural cardiovascular examination: Secondary | ICD-10-CM

## 2017-07-30 DIAGNOSIS — I10 Essential (primary) hypertension: Secondary | ICD-10-CM | POA: Diagnosis not present

## 2017-07-30 DIAGNOSIS — Z9049 Acquired absence of other specified parts of digestive tract: Secondary | ICD-10-CM | POA: Insufficient documentation

## 2017-07-30 DIAGNOSIS — Z8571 Personal history of Hodgkin lymphoma: Secondary | ICD-10-CM | POA: Diagnosis not present

## 2017-07-30 DIAGNOSIS — Z9221 Personal history of antineoplastic chemotherapy: Secondary | ICD-10-CM | POA: Insufficient documentation

## 2017-07-30 DIAGNOSIS — Z923 Personal history of irradiation: Secondary | ICD-10-CM | POA: Insufficient documentation

## 2017-07-30 DIAGNOSIS — Z79899 Other long term (current) drug therapy: Secondary | ICD-10-CM | POA: Diagnosis not present

## 2017-07-30 DIAGNOSIS — F329 Major depressive disorder, single episode, unspecified: Secondary | ICD-10-CM | POA: Insufficient documentation

## 2017-07-30 DIAGNOSIS — R072 Precordial pain: Secondary | ICD-10-CM | POA: Diagnosis not present

## 2017-07-30 NOTE — Progress Notes (Signed)
ADVANCED HF CLINIC CONSULT  NOTE  Patient ID: [name] [name], female   DOB: Dec 04, 1970, 47 y.o.   MRN: [id number] Primary Care: Dr. 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 [place] Crane for pre-op clearance.   She has been treated with ABVD (Adriamycin, Bleomycin, Vinblastine, Dacarbazine) but with incomplete response both after cycles 3 and cycles 5 and was switched to dose adjusted BEACOPP (Bleomycin, Etoposide, Doxorubcin, Cyclophoshamide, Vincristine, Procarbazine, Predinsone) starting on 08/28/2012. Finished treatment June 2014. Had radiation to her chest.   She has done well from an oncologic standpoint.   She presents today for medical clearance for oral surgery on 2/25. Has chest heaviness at all times since chemo. No SOB with activity/stairs. Sometimes has swelling in hands and feet with salty foods. BPs at home: 140/80's. No dizziness or lightheadedness. + bendopnea at times. No orthopnea or PND. Decreased energy. Sleep study in 2017 - no need for CPAP.   ECG today: Sinus tach with LVH nonspecific ST abnormality Personally reviewed   CT chest 3/17 no coronary calcification. Stress echo 7/17 normal    Labs 07/12/15 K 5.8, Creatinine 0.76, TSH 0.342 (low), T4 and T3 normal 07/27/15 K 4.9, Creatinine 0.76 10/25/15 K 5.4, Creatinine 0.86, TSH 0.912 11/09/15 K 4.8, Creatinine 0.85, CRP-Cardiac 14.51, ANA negative. ESR 6 (WNL) 12/01/15 Negative Sjogren's Antibody, Normal PTH    ECHOs (08/2012): EF 65-70%, grade I DD (01/28/2014): EF 60-65%, grade I DD, normal filling pressures, GS -19.9, lateral s' 11.8  Review of Systems: [y] = yes, [ ]  = no    General: Weight gain [] ; Weight loss [ ] ; Anorexia [  ]; Fatigue [y]; Fever [ ] ; Chills [ ] ; Weakness Blue.Reese ]   Cardiac: Chest pain/pressure Blue.Reese ]; Resting SOB [ y]; Exertional SOB [] ; Orthopnea [ ] ; Pedal Edema [] ; Palpitations [ ] ; Syncope [ ] ; Presyncope [ ] ; Paroxysmal nocturnal dyspnea[ ]    Pulmonary: Cough [ ] ; Wheezing[ ] ; Hemoptysis[ ] ; Sputum [ ] ; Snoring [ ]    GI: Vomiting[ ] ; Dysphagia[ ] ; Melena[ ] ; Hematochezia [ ] ; Heartburn[ ] ; Abdominal pain [ ] ; Constipation [ ] ; Diarrhea [ ] ; BRBPR [ ]    GU: Hematuria[ ] ; Dysuria [ ] ; Nocturia[ ]   Vascular: Pain in legs with walking [ ] ; Pain in feet with lying flat [ ] ; Non-healing sores [ ] ; Stroke [ ] ; TIA [ ] ; Slurred speech [ ] ;   Neuro: Headaches[ ] ; Vertigo[ ] ; Seizures[ ] ; Paresthesias[ ] ;Blurred vision [ ] ; Diplopia [ ] ; Vision changes [ ]    Ortho/Skin: Arthritis [y]; Joint pain [y]; Muscle pain [ ] ; Joint swelling [ ] ; Back Pain [ ] ; Rash [ ]    Psych: Depression[ ] ; Anxiety[ ]    Heme: Bleeding problems [ ] ; Clotting disorders [ ] ; Anemia [ ]    Endocrine: Diabetes [ ] ; Thyroid dysfunction[ ]     Past Medical History:  Diagnosis Date  . Anemia    As a child and while pregnant  . Cancer ([place])    IIA Hodgkin's disease classic nodular sclerosing type  . Choledocholithiasis 04/05/2016   s/p ERCP, sphincterotomy, and stent placement  . Depression    parents died close together "was hard"  . Dyspnea    all the time .".due to the  mass"  . Eczema   . Fibromyalgia   . Headache(784.0)    Birth control pills help  . Hemorrhoid   . History of radiation therapy 10/28/12-11/20/12   chest & neck,34Gy/36f  . Hypertension    recently dx with hodgkins  . Restless leg   . S/P cholecystectomy 09/14/2013   08/29/2013 by Dr. JArnoldo Morale  Negative pathology  . Sleep apnea    Sleep Study- 1996- "little apnea- no tx"; recent study showed the same in 2017    Current Outpatient Medications  Medication Sig Dispense Refill  . cyclobenzaprine (FLEXERIL) 10 MG tablet Take 10 mg by mouth at  bedtime.    . Ibuprofen-Diphenhydramine Cit (ADVIL PM PO) Take 2 tablets by mouth at bedtime as needed (pain/sleep).     .Lenda KelpFE 1/20 1-20 MG-MCG tablet Take 1 tablet by mouth daily.    .Marland Kitchenlevothyroxine (SYNTHROID, LEVOTHROID) 125 MCG tablet Take 125 mcg by mouth daily before breakfast.    . piroxicam (FELDENE) 20 MG capsule Take 1 capsule by mouth daily as needed (plantar facitis.).     .Marland Kitchentemazepam (RESTORIL) 15 MG capsule Take 15 mg by mouth at bedtime.      No current facility-administered medications for this encounter.    Facility-Administered Medications Ordered in Other Encounters  Medication Dose Route Frequency Provider Last Rate Last Dose  . sodium chloride 0.9 % injection 10 mL  10 mL Intravenous PRN NEverardo All MD   10 mL at 10/03/12 1643    Allergies  Allergen Reactions  . Other Other (See Comments)    Patient can not have straight oxygen due to reaction with chemo medications.   .Marland KitchenSulfur Anaphylaxis      Social History   Socioeconomic History  . Marital status: Married    Spouse name: Not on file  . Number of children: Not on file  . Years of education: Not on file  . Highest education level: Not on file  Social Needs  . Financial resource strain: Not on file  . Food insecurity - worry: Not on file  . Food insecurity - inability: Not on file  . Transportation needs - medical: Not on file  . Transportation needs - non-medical: Not on file  Occupational History  . Occupation: pEngineering geologist Tobacco Use  . Smoking status: Never Smoker  . Smokeless tobacco: Never Used  Substance and Sexual Activity  . Alcohol use: Yes    Alcohol/week: 0.0 oz    Comment: occasionaly 2 x month; rarely  . Drug use: No  . Sexual activity: Yes    Birth control/protection: Pill  Other Topics Concern  . 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 [place]       Family History  Problem Relation Age  of Onset  . Cancer Paternal Uncle   . Cancer Maternal Grandmother        lung ca mets to throat  . Colon cancer Neg Hx     Vitals:   07/30/17 1532  BP: (!) 147/97  Pulse: 90  SpO2: 100%  Weight: 208 lb (94.3 kg)   Wt Readings from Last 3 Encounters:  07/30/17 208 lb (94.3 kg)  03/23/17 205 lb 14.4 oz (93.4 kg)  09/21/16 223 lb 6.4 oz (101.3 kg)     PHYSICAL EXAM: General:  Well appearing. No respiratory difficulty HEENT: normal Neck: supple. no JVD. Carotids 2+ bilat; no bruits. No thyromegaly or  nodule noted. Cor: PMI nondisplaced. RRR. No rubs, gallops or murmurs. Lungs: CTAB, normal effort Abdomen: obese soft, NT, ND, no HSM. No bruits or masses. +BS  Extremities: no cyanosis, clubbing, rash. Nonpitting edema BLE. Neuro: alert & oriented x 3, cranial nerves grossly intact. moves all 4 extremities w/o difficulty. Flat affect  ASSESSMENT & PLAN:  1) Clearance for oral surgery with general anesthesia - Last echo 01/2014 with EF 55-60% and no evidence of chemotoxicity (had h/o Hodgkin's lymphoma treated with adriamycin) - EKG today 2) Depression - As previously, some of her fatigue may be related to depression. Should discuss further with PCP 3) Snoring - Sleep study negative per patient (02/2016) 4. HTN - BP 182/112 left arm, recheck 172/114 right arm - Feeling anxious. Had refried beans with lunch.  - Has not been on BP meds for years 5. CP - atypical. Stress echo 2017 was normal  Georgiana Shore NP-C 3:51 PM  Patient seen and examined with the above-signed Advanced Practice Provider and/or Housestaff. I personally reviewed laboratory data, imaging studies and relevant notes. I independently examined the patient and formulated the important aspects of the plan. I have edited the note to reflect any of my changes or salient points. I have personally discussed the plan with the patient and/or family.  Stress echo, ecg and previous chest CT all reviewed personally. She  has preserved functional capacity with no high pre-operative risk factors. I feel as if she is low risk for peri-operative CV complications with dental surgery. She can proceed without further cardiac testing.   BP high here but well controlled at home. Asked her to continue to follow with home BP cuff.   Glori Bickers, MD  4:13 PM

## 2017-09-14 ENCOUNTER — Other Ambulatory Visit (HOSPITAL_COMMUNITY): Payer: Self-pay | Admitting: *Deleted

## 2017-09-14 DIAGNOSIS — C8190 Hodgkin lymphoma, unspecified, unspecified site: Secondary | ICD-10-CM

## 2017-09-17 ENCOUNTER — Encounter (HOSPITAL_COMMUNITY): Payer: Self-pay

## 2017-09-17 ENCOUNTER — Ambulatory Visit (HOSPITAL_COMMUNITY)
Admission: RE | Admit: 2017-09-17 | Discharge: 2017-09-17 | Disposition: A | Discharge status: Home / Self Care | Payer: Commercial Managed Care - PPO | Source: Ambulatory Visit | Attending: Hematology and Oncology | Admitting: Hematology and Oncology

## 2017-09-17 ENCOUNTER — Inpatient Hospital Stay (HOSPITAL_COMMUNITY): Payer: Commercial Managed Care - PPO | Attending: Internal Medicine

## 2017-09-17 DIAGNOSIS — E039 Hypothyroidism, unspecified: Secondary | ICD-10-CM | POA: Insufficient documentation

## 2017-09-17 DIAGNOSIS — C8118 Nodular sclerosis classical Hodgkin lymphoma, lymph nodes of multiple sites: Secondary | ICD-10-CM | POA: Insufficient documentation

## 2017-09-17 DIAGNOSIS — C8110 Nodular sclerosis classical Hodgkin lymphoma, unspecified site: Secondary | ICD-10-CM | POA: Insufficient documentation

## 2017-09-17 DIAGNOSIS — C8190 Hodgkin lymphoma, unspecified, unspecified site: Secondary | ICD-10-CM

## 2017-09-17 DIAGNOSIS — Z79899 Other long term (current) drug therapy: Secondary | ICD-10-CM | POA: Diagnosis not present

## 2017-09-17 LAB — CBC WITH DIFFERENTIAL/PLATELET
Basophils Absolute: 0 10*3/uL (ref 0.0–0.1)
Basophils Relative: 0 %
Eosinophils Absolute: 0.1 10*3/uL (ref 0.0–0.7)
Eosinophils Relative: 1 %
HCT: 40.2 % (ref 36.0–46.0)
Hemoglobin: 13.9 g/dL (ref 12.0–15.0)
Lymphocytes Relative: 21 %
Lymphs Abs: 1.4 10*3/uL (ref 0.7–4.0)
MCH: 32 pg (ref 26.0–34.0)
MCHC: 34.6 g/dL (ref 30.0–36.0)
MCV: 92.4 fL (ref 78.0–100.0)
Monocytes Absolute: 0.4 10*3/uL (ref 0.1–1.0)
Monocytes Relative: 6 %
Neutro Abs: 4.7 10*3/uL (ref 1.7–7.7)
Neutrophils Relative %: 71 %
Platelets: 255 10*3/uL (ref 150–400)
RBC: 4.35 MIL/uL (ref 3.87–5.11)
RDW: 12.4 % (ref 11.5–15.5)
WBC: 6.6 10*3/uL (ref 4.0–10.5)

## 2017-09-17 LAB — COMPREHENSIVE METABOLIC PANEL
ALT: 16 U/L (ref 14–54)
AST: 22 U/L (ref 15–41)
Albumin: 4.1 g/dL (ref 3.5–5.0)
Alkaline Phosphatase: 45 U/L (ref 38–126)
Anion gap: 12 (ref 5–15)
BUN: 16 mg/dL (ref 6–20)
CO2: 27 mmol/L (ref 22–32)
Calcium: 10 mg/dL (ref 8.9–10.3)
Chloride: 100 mmol/L — ABNORMAL LOW (ref 101–111)
Creatinine, Ser: 0.88 mg/dL (ref 0.44–1.00)
GFR calc Af Amer: >60 mL/min (ref >60)
GFR calc non Af Amer: >60 mL/min (ref >60)
Glucose, Bld: 103 mg/dL — ABNORMAL HIGH (ref 65–99)
Potassium: 4.5 mmol/L (ref 3.5–5.1)
Sodium: 139 mmol/L (ref 135–145)
Total Bilirubin: 0.6 mg/dL (ref 0.3–1.2)
Total Protein: 7.8 g/dL (ref 6.5–8.1)

## 2017-09-17 LAB — LACTATE DEHYDROGENASE: LDH: 124 U/L (ref 98–192)

## 2017-09-17 LAB — URIC ACID: Uric Acid, Serum: 4.6 mg/dL (ref 2.3–6.6)

## 2017-09-17 MED ORDER — IOPAMIDOL (ISOVUE-300) INJECTION 61%
100.0000 mL | Freq: Once | INTRAVENOUS | Status: AC | PRN
  Administered 2017-09-17: 100 mL via INTRAVENOUS

## 2017-09-20 ENCOUNTER — Other Ambulatory Visit: Payer: Self-pay

## 2017-09-20 ENCOUNTER — Encounter (HOSPITAL_COMMUNITY): Payer: Self-pay | Admitting: Hematology

## 2017-09-20 ENCOUNTER — Inpatient Hospital Stay (HOSPITAL_BASED_OUTPATIENT_CLINIC_OR_DEPARTMENT_OTHER): Payer: Commercial Managed Care - PPO | Admitting: Hematology

## 2017-09-20 VITALS — BP 156/84 | HR 91 | Temp 97.5°F | Resp 16 | Wt 212.0 lb

## 2017-09-20 DIAGNOSIS — C8118 Nodular sclerosis classical Hodgkin lymphoma, lymph nodes of multiple sites: Secondary | ICD-10-CM | POA: Diagnosis not present

## 2017-09-20 DIAGNOSIS — E039 Hypothyroidism, unspecified: Secondary | ICD-10-CM | POA: Diagnosis not present

## 2017-09-20 DIAGNOSIS — C8112 Nodular sclerosis classical Hodgkin lymphoma, intrathoracic lymph nodes: Secondary | ICD-10-CM

## 2017-09-20 LAB — TSH: TSH: 1.323 u[IU]/mL (ref 0.350–4.500)

## 2017-09-20 NOTE — Assessment & Plan Note (Signed)
1.  Hodgkin's lymphoma: - Initially treated with 5 cycles of ABVD, with progression -Treated with BEACOPP and IFRT, completed in June 2014, in remission since then -Denies any B symptoms, physical examination today is within normal limits. -I have reviewed the results of the CT scan of the chest, abdomen and pelvis done on 09/17/2017 which did not show any evidence of recurrence or persistence of lymphoma.  Her blood work was also within normal limits.  She has completed 5 years of active surveillance.  She will come back in 6 months for follow-up.  I will repeat blood work at that time.  I will do CT scans if her clinical condition dictates.  2.  Hypothyroidism: We have sent a TSH level today.  She is currently on Synthroid 125 mcg daily.

## 2017-09-20 NOTE — Progress Notes (Signed)
Patient Care Team: Celene Squibb, MD as PCP - General (Internal Medicine) Gala Romney Cristopher Estimable, MD as Consulting Physician (Gastroenterology)  DIAGNOSIS:  Encounter Diagnosis  Name Primary?  . 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 She was then switched to dose adjusted BEACOPP x2 cycles. Her PET scan after the second cycle showed complete remission. Because of probable inflammatory changes in the lungs and a prior Citrobacter blood stream/pulmonary infection felt to be the cause of the PET scan findings in the lungs she has had Levaquin which she is now finished. Now S/P involved field radiation by Dr. Thea Silversmith ([place]) finishing on 11/20/2012.     Hodgkin's lymphoma ([place])   09/14/2010 Imaging    Chest xray demonstrating- Possible mediastinal mass as discussed above.  Recommend chest CT scan for further evaluation.  Further work-up by ordering physician never performed      02/26/2012 Imaging    CT of chest- Large anterior mediastinal mass is identified and is worrisome for tumor.  This is encasing the great vessels and S causing significant narrowing of the left subclavian vein.      03/05/2012 Bone Marrow Biopsy    Negative      03/05/2012 Initial Diagnosis    Anterior mediastinal mass biopsy- Classical Hodgkin's lymphoma      03/13/2012 Imaging    PET scan- hypermetabolic anterior mediastinal mass, extension of dominant mass in the low left neck, isolated small right lower jugular hypermetabolic node, no subdiaphragmatic disease.      03/27/2012 - 08/07/2012 Chemotherapy    ABVD x 5 cycles but with incomplete response  evaluated after cycle 3 and after cycle #5.      06/20/2012 Imaging    PET scan- interval response to therapy as evidenced by decrease in size and metabolic activity involving the anterior mediastinal mass      08/16/2012 Imaging    PET scan- The anterior mediastinal soft tissue mass shows no interval change in size and there is a persistent small peripheral focus of hypermetabolism within this lesion on today's exam.      08/28/2012 - 09/25/2012 Chemotherapy    BEACOPP x 2 cycles      10/08/2012 Imaging    PET scan- Today's study demonstrates a positive response to therapy with decrease in size and resolution of hypermetabolism within the anterior mediastinal nodal mass.  The mass currently measures 6.2 x 2.5 cm.      10/09/2012 Remission         10/23/2012 - 11/20/2012 Radiation Therapy    Dr. Pablo Ledger      12/02/2012 Adverse Reaction    Radiation recall- negative work-up.  Treated with Prednisone.      05/26/2013 Imaging    PET scan- No substantial change in size of the anterior mediastinal mass although there is no hypermetabolism within this lesion on the current study.  Interval decrease in spleen size.      11/12/2013 Imaging    CT CAP- decrease size in medistinal mass.  NED.      02/09/2014 PET scan    Persistent soft tissue within the anterior mediastinum with very low level metabolic activity ([place]  Criteria 2). Findings likely reflect treated tumor.       06/11/2014 Mammogram    Negative      07/01/2014 Imaging    Xray of orbits- No evidence of metallic foreign body within the orbits.      08/27/2015 Imaging    CT C/A/P Further decreased size of sessile appearing nodal lesion in the anterior mediastinum compared to prior study. no findings to suggest recurrent disease in the chest abdomen or pelvis       CHIEF COMPLIANT:   INTERVAL HISTORY: [name] [name] is seen today for follow-up of Hodgkin's lymphoma.  She denies any fevers, night sweats or weight loss  in the last 6 months.  She denies any tingling or numbness next with this.  No new onset pains reported.  No shortness of breath on exertion.  Denies any hospitalizations or infections.  REVIEW OF SYSTEMS:   Constitutional: Denies fevers, chills or abnormal weight loss Eyes: Denies blurriness of vision Ears, nose, mouth, throat, and face: Denies mucositis or sore throat Respiratory: Denies cough, dyspnea or wheezes Cardiovascular: Denies palpitation, chest discomfort Gastrointestinal:  Denies nausea, heartburn or change in bowel habits Skin: Denies abnormal skin rashes Lymphatics: Denies new lymphadenopathy or easy bruising Neurological:Denies numbness, tingling or new weaknesses Behavioral/Psych: Mood is stable, no new changes  Extremities: No lower extremity edema Breast:  denies any pain or lumps or nodules in either breasts All other systems were reviewed with the patient and are negative.  I have reviewed the past medical history, past surgical history, social history and family history with the patient and they are unchanged from previous note.  ALLERGIES:  is allergic to other and sulfur.  MEDICATIONS:  Current Outpatient Medications  Medication Sig Dispense Refill  . cyclobenzaprine (FLEXERIL) 10 MG tablet Take 10 mg by mouth at bedtime.    Marland Kitchen escitalopram (LEXAPRO) 5 MG tablet Take 5 mg by mouth daily.    . Ibuprofen-Diphenhydramine Cit (ADVIL PM PO) Take 2 tablets by mouth at bedtime as needed (pain/sleep).     Lenda Kelp FE 1/20 1-20 MG-MCG tablet Take 1 tablet by mouth daily.    Marland Kitchen levothyroxine (SYNTHROID, LEVOTHROID) 125 MCG tablet Take 125 mcg by mouth daily before breakfast.    . losartan (COZAAR) 50 MG tablet Take 50 mg by mouth daily.    . temazepam (RESTORIL) 15 MG capsule Take 15 mg by mouth at bedtime.     . piroxicam (FELDENE) 20 MG capsule Take 1 capsule by mouth daily as needed (plantar facitis.).      No current facility-administered medications for this visit.      Facility-Administered Medications Ordered in Other Visits  Medication Dose Route Frequency Provider Last Rate Last Dose  . sodium chloride 0.9 % injection 10 mL  10 mL Intravenous PRN Everardo All, MD   10 mL at 10/03/12 1643    PHYSICAL EXAMINATION: ECOG PERFORMANCE STATUS: 1 - Symptomatic but completely ambulatory  Vitals:   09/20/17 1552  BP: (!) 156/84  Pulse: 91  Resp: 16  Temp: (!) 97.5 F (36.4 C)  SpO2: 99%   Filed Weights   09/20/17 1552  Weight: 212 lb (96.2 kg)    GENERAL:alert, no distress and comfortable SKIN: skin color, texture, turgor are normal, no rashes or significant lesions EYES: normal, Conjunctiva are pink and non-injected, sclera clear OROPHARYNX:no mucositis, no erythema and lips, buccal mucosa, and tongue normal  NECK: supple, thyroid normal size, non-tender, without nodularity LYMPH:  no palpable lymphadenopathy in  the cervical, axillary or inguinal LUNGS: clear to auscultation and percussion with normal breathing effort HEART: regular rate & rhythm and no murmurs and no lower extremity edema ABDOMEN:abdomen soft, non-tender and normal bowel sounds MUSCULOSKELETAL:no cyanosis of digits and no clubbing   EXTREMITIES: No lower extremity edema   LABORATORY DATA:  I have reviewed the data as listed CMP Latest Ref Rng & Units 09/17/2017 03/20/2017 04/06/2016  Glucose 65 - 99 mg/dL 103(H) 100(H) 83  BUN 6 - 20 mg/dL 16 17 6   Creatinine 0.44 - 1.00 mg/dL 0.88 0.84 0.71  Sodium 135 - 145 mmol/L 139 139 135  Potassium 3.5 - 5.1 mmol/L 4.5 3.8 3.3(L)  Chloride 101 - 111 mmol/L 100(L) 103 103  CO2 22 - 32 mmol/L 27 25 26   Calcium 8.9 - 10.3 mg/dL 10.0 9.5 8.6(L)  Total Protein 6.5 - 8.1 g/dL 7.8 7.5 6.1(L)  Total Bilirubin 0.3 - 1.2 mg/dL 0.6 0.8 1.0  Alkaline Phos 38 - 126 U/L 45 49 111  AST 15 - 41 U/L 22 21 50(H)  ALT 14 - 54 U/L 16 24 84(H)   No results found for: [id number]   Lab Results  Component Value Date   WBC 6.6 09/17/2017   HGB  13.9 09/17/2017   HCT 40.2 09/17/2017   MCV 92.4 09/17/2017   PLT 255 09/17/2017   NEUTROABS 4.7 09/17/2017    ASSESSMENT & PLAN:  Hodgkin's lymphoma ([place]) 1.  Hodgkin's lymphoma: - Initially treated with 5 cycles of ABVD, with progression -Treated with BEACOPP and IFRT, completed in June 2014, in remission since then -Denies any B symptoms, physical examination today is within normal limits. -I have reviewed the results of the CT scan of the chest, abdomen and pelvis done on 09/17/2017 which did not show any evidence of recurrence or persistence of lymphoma.  Her blood work was also within normal limits.  She has completed 5 years of active surveillance.  She will come back in 6 months for follow-up.  I will repeat blood work at that time.  I will do CT scans if her clinical condition dictates.  2.  Hypothyroidism: We have sent a TSH level today.  She is currently on Synthroid 125 mcg daily.    Orders Placed This Encounter  Procedures  . CBC with Differential    Standing Status:   Future    Standing Expiration Date:   09/20/2018  . Comprehensive metabolic panel    Standing Status:   Future    Standing Expiration Date:   09/20/2018  . Lactate dehydrogenase    Standing Status:   Future    Standing Expiration Date:   09/20/2018  . Sedimentation rate    Standing Status:   Future    Standing Expiration Date:   09/20/2018  . TSH    Standing Status:   Future    Number of Occurrences:   1    Standing Expiration Date:   09/20/2018  . TSH    Standing Status:   Future    Standing Expiration Date:   09/20/2018   The patient has a good understanding of the overall plan. she agrees with it. she will call with any problems that may develop before the next visit here.   Derek Jack, MD 09/20/17

## 2017-12-02 IMAGING — CT CT ABD-PELV W/ CM
2 of 5 series · 13 of 36 positions shown, 16 images · IV contrast (iopamidol)
Comparison: CT the abdomen and pelvis 01/10/2016. Chest CT
08/26/2015.

CLINICAL DATA: 45-year-old female with history of severe right
upper quadrant abdominal pain for the past 3 days with nausea.
History of lymphoma treated in 0037.

EXAM:
CT CHEST, ABDOMEN, AND PELVIS WITH CONTRAST
TECHNIQUE: Multidetector CT imaging of the chest, abdomen and pelvis was
performed following the standard protocol during bolus
administration of intravenous contrast.
CONTRAST:  100mL 902JI3-VDD IOPAMIDOL (902JI3-VDD) INJECTION 61%

[Series 2: cap with · axial · 0.98mm/px · z∈[+1044,+1449]mm · 10 of 97 slices shown, 13 images]
[im 8/97  mediastinal]
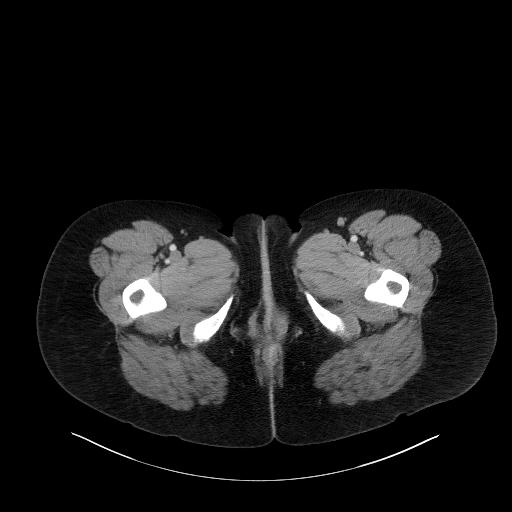
[im 8/97  lung]
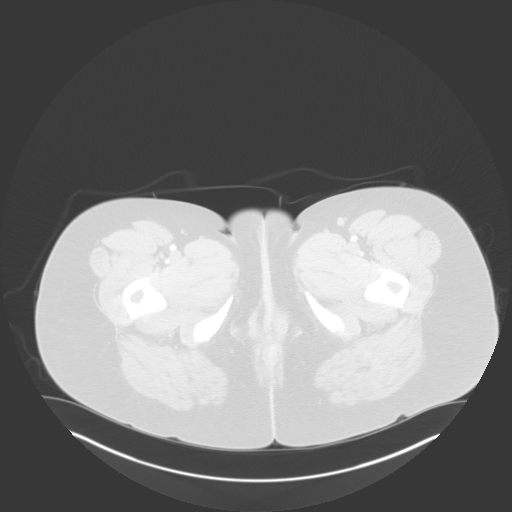
[im 15/97  lung]
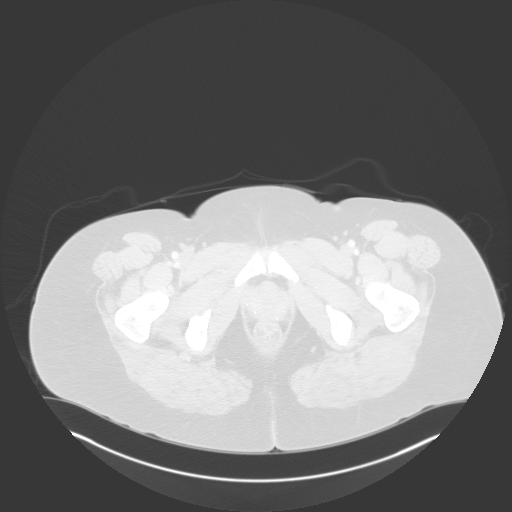
[im 30/97  lung]
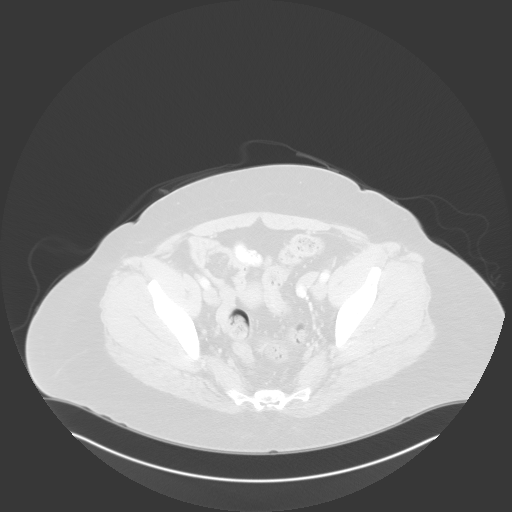
[im 37/97  lung]
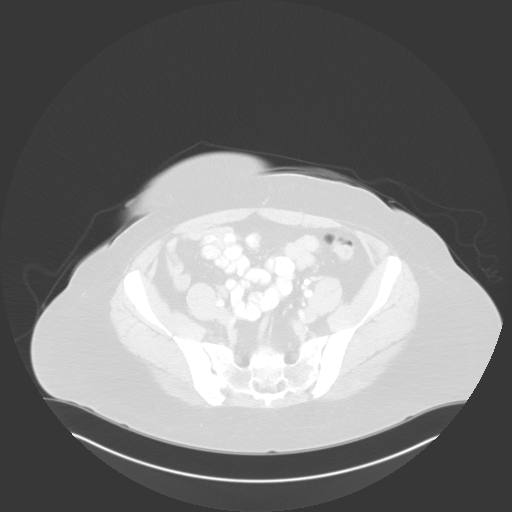
[im 45/97  mediastinal]
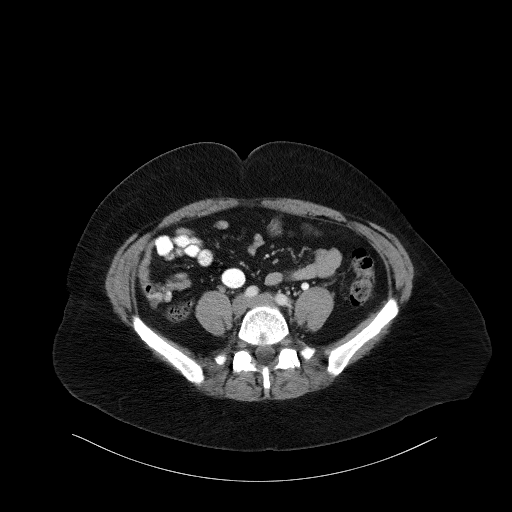
[im 45/97  lung]
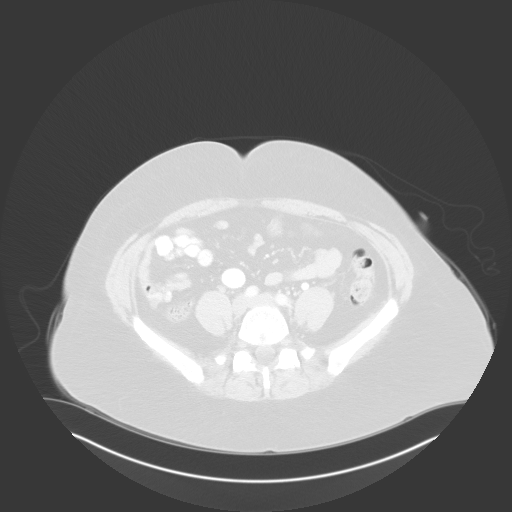
[im 52/97  lung]
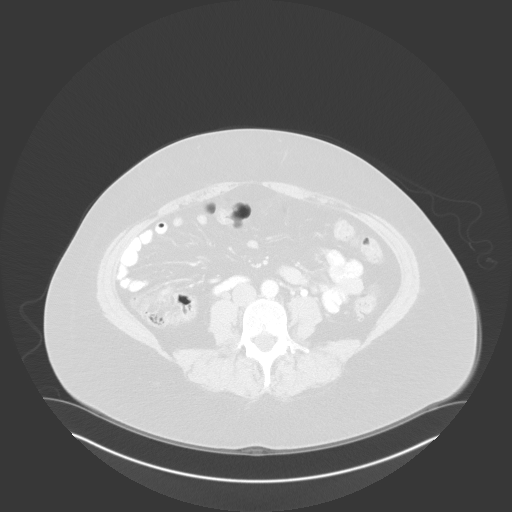
[im 60/97  lung]
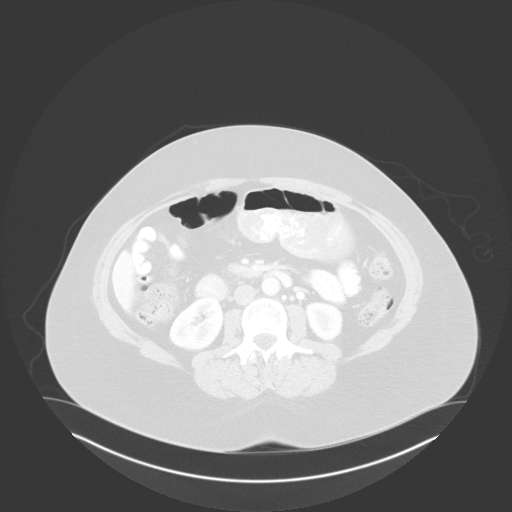
[im 74/97  lung]
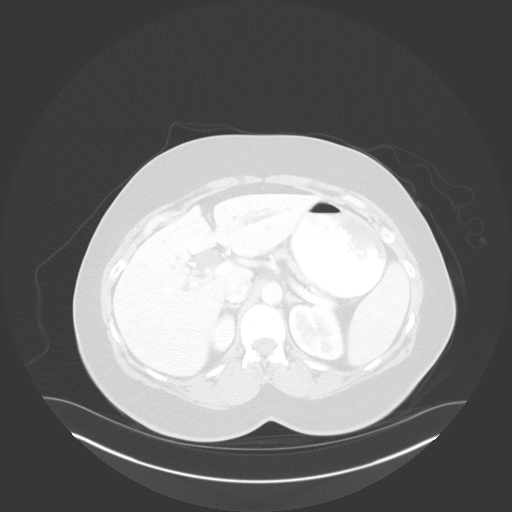
[im 82/97  mediastinal]
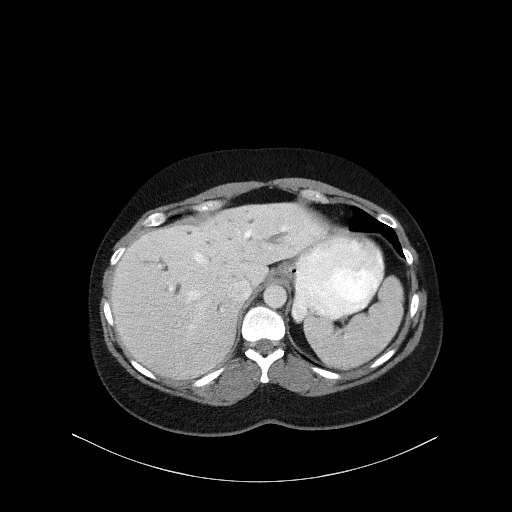
[im 82/97  lung]
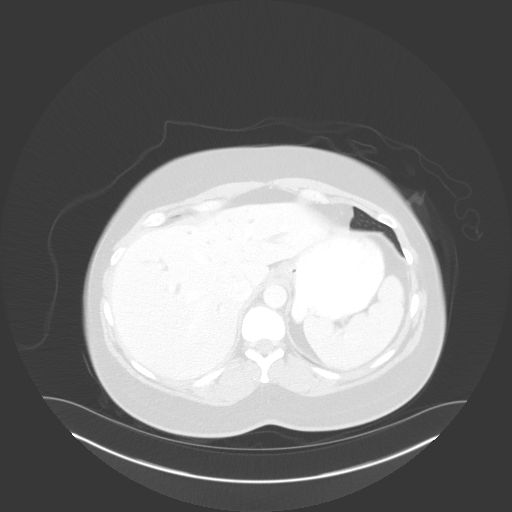
[im 89/97  lung]
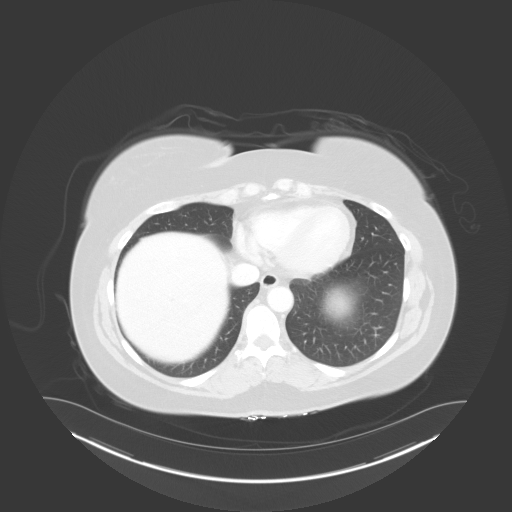

[Series 10: coronals abd/pel · coronal · 0.75mm/px · 3 of 155 slices shown]
[im 31/155  lung]
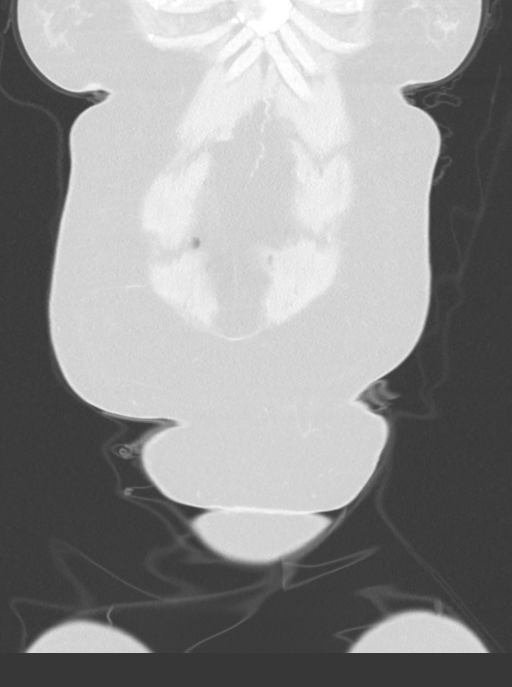
[im 62/155  lung]
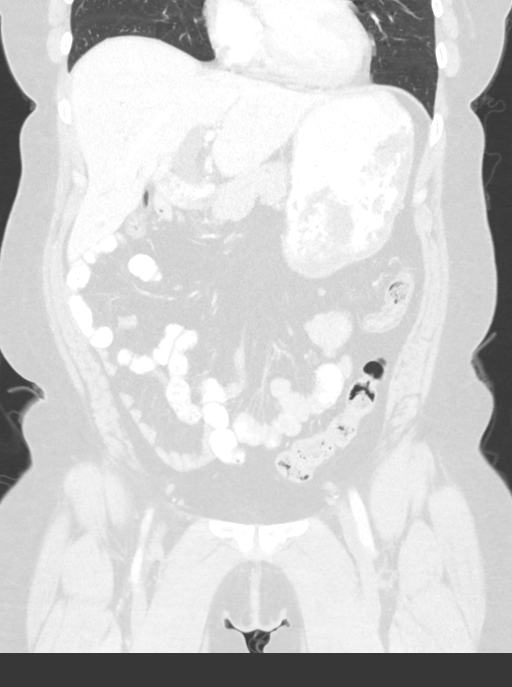
[im 93/155  lung]
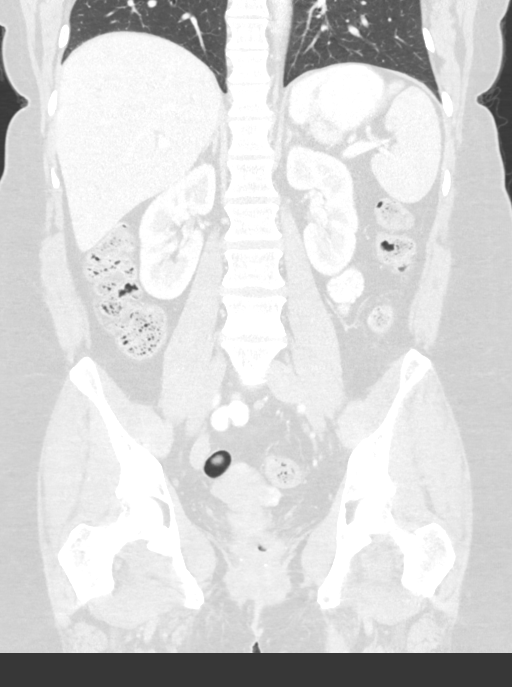

[13 of 36 positions shown; findings below may reference images not displayed]

FINDINGS: CT CHEST FINDINGS

Cardiovascular: Heart size is normal. There is no significant
pericardial fluid, thickening or pericardial calcification.

Mediastinum/Nodes: Previously demonstrated small amount of sessile
soft tissue adjacent to the proximal aortic arch in the anterior
mediastinum is slightly smaller compared to the prior examination
measuring only 2.1 x 0.9 cm on today's study (axial image 20 of
series 14), again most compatible with treated lymphoma. No other
pathologically enlarged mediastinal or hilar lymph nodes. Esophagus
is normal in appearance. No axillary lymphadenopathy.

Lungs/Pleura: No acute consolidative airspace disease. No pleural
effusions. No suspicious appearing pulmonary nodules or masses.

Musculoskeletal: There are no aggressive appearing lytic or blastic
lesions noted in the visualized portions of the skeleton.

CT ABDOMEN PELVIS FINDINGS

Hepatobiliary: No discrete cystic or solid hepatic lesions. Status
post cholecystectomy. Compared to the prior examinations there is
new moderate intrahepatic biliary ductal dilatation. Severe
extrahepatic biliary ductal dilatation, with the common bile duct
measuring up to 14 mm in the porta hepatis. Notably, immediately
before the ampulla there is some intermediate to high attenuation
material in the distal common bile duct, which could represent
retained ductal stones, sludge balls, or soft tissue in the setting
of an ampullary neoplasm.

Pancreas: No definite pancreatic mass. No pancreatic ductal
dilatation. No pancreatic or peripancreatic fluid or inflammatory
changes.

Spleen: Unremarkable.

Adrenals/Urinary Tract: Bilateral kidneys and bilateral adrenal
glands are normal in appearance. No hydroureteronephrosis. Urinary
bladder is normal in appearance.

Stomach/Bowel: Normal appearance of the stomach. No pathologic
dilatation of small bowel or colon. Normal appendix.

Vascular/Lymphatic: No significant atherosclerotic disease, aneurysm
or dissection identified in the abdominal or pelvic vasculature.
Dilated left gonadal vein. No lymphadenopathy noted in the abdomen
or pelvis.

Reproductive: Uterus and ovaries are unremarkable in appearance.

Other: No significant volume of ascites.  No pneumoperitoneum.

Musculoskeletal: There are no aggressive appearing lytic or blastic
lesions noted in the visualized portions of the skeleton.
IMPRESSION: 1. New biliary tract obstruction which appears to be related to some
intermediate to high attenuation material in the distal common bile
duct at or immediately before the level of the ampulla. Whether or
not this reflects retained ductal stones, sludge balls or small
ampullary neoplasm is uncertain on today's examination, and further
evaluation with MRI of the abdomen with and without IV gadolinium
with MRCP is suggested.
2. No findings to suggest recurrent lymphoma in the chest, abdomen
or pelvis. Evidence of treated lymphoma in the anterior mediastinum
is unchanged.
3. Additional incidental findings, as above.
These results will be called to the ordering clinician or
representative by the Radiologist Assistant, and communication
documented in the PACS or zVision Dashboard.

## 2018-03-02 DIAGNOSIS — K838 Other specified diseases of biliary tract: Secondary | ICD-10-CM | POA: Diagnosis not present

## 2018-03-02 DIAGNOSIS — Z8719 Personal history of other diseases of the digestive system: Secondary | ICD-10-CM | POA: Diagnosis not present

## 2018-03-02 DIAGNOSIS — F339 Major depressive disorder, recurrent, unspecified: Secondary | ICD-10-CM | POA: Diagnosis not present

## 2018-03-02 DIAGNOSIS — E039 Hypothyroidism, unspecified: Secondary | ICD-10-CM | POA: Diagnosis not present

## 2018-03-02 DIAGNOSIS — E6609 Other obesity due to excess calories: Secondary | ICD-10-CM | POA: Diagnosis not present

## 2018-03-02 DIAGNOSIS — R109 Unspecified abdominal pain: Secondary | ICD-10-CM | POA: Diagnosis not present

## 2018-03-08 DIAGNOSIS — C8190 Hodgkin lymphoma, unspecified, unspecified site: Secondary | ICD-10-CM | POA: Diagnosis not present

## 2018-03-08 DIAGNOSIS — I10 Essential (primary) hypertension: Secondary | ICD-10-CM | POA: Diagnosis not present

## 2018-03-08 DIAGNOSIS — E039 Hypothyroidism, unspecified: Secondary | ICD-10-CM | POA: Diagnosis not present

## 2018-03-08 DIAGNOSIS — F5101 Primary insomnia: Secondary | ICD-10-CM | POA: Diagnosis not present

## 2018-03-08 DIAGNOSIS — Z23 Encounter for immunization: Secondary | ICD-10-CM | POA: Diagnosis not present

## 2018-03-14 ENCOUNTER — Other Ambulatory Visit (HOSPITAL_COMMUNITY): Payer: Commercial Managed Care - PPO

## 2018-03-15 DIAGNOSIS — R87810 Cervical high risk human papillomavirus (HPV) DNA test positive: Secondary | ICD-10-CM | POA: Diagnosis not present

## 2018-03-15 DIAGNOSIS — Z1151 Encounter for screening for human papillomavirus (HPV): Secondary | ICD-10-CM | POA: Diagnosis not present

## 2018-03-15 DIAGNOSIS — Z6834 Body mass index (BMI) 34.0-34.9, adult: Secondary | ICD-10-CM | POA: Diagnosis not present

## 2018-03-15 DIAGNOSIS — Z01419 Encounter for gynecological examination (general) (routine) without abnormal findings: Secondary | ICD-10-CM | POA: Diagnosis not present

## 2018-03-20 ENCOUNTER — Ambulatory Visit (HOSPITAL_COMMUNITY): Payer: Commercial Managed Care - PPO | Admitting: Hematology

## 2018-03-21 ENCOUNTER — Ambulatory Visit (HOSPITAL_COMMUNITY): Payer: Commercial Managed Care - PPO | Admitting: Hematology

## 2018-08-26 DIAGNOSIS — Z1329 Encounter for screening for other suspected endocrine disorder: Secondary | ICD-10-CM | POA: Diagnosis not present

## 2018-08-26 DIAGNOSIS — E039 Hypothyroidism, unspecified: Secondary | ICD-10-CM | POA: Diagnosis not present

## 2018-08-26 DIAGNOSIS — I10 Essential (primary) hypertension: Secondary | ICD-10-CM | POA: Diagnosis not present

## 2018-09-10 DIAGNOSIS — F419 Anxiety disorder, unspecified: Secondary | ICD-10-CM | POA: Diagnosis not present

## 2018-09-10 DIAGNOSIS — F5101 Primary insomnia: Secondary | ICD-10-CM | POA: Diagnosis not present

## 2018-09-10 DIAGNOSIS — E039 Hypothyroidism, unspecified: Secondary | ICD-10-CM | POA: Diagnosis not present

## 2018-09-10 DIAGNOSIS — I10 Essential (primary) hypertension: Secondary | ICD-10-CM | POA: Diagnosis not present

## 2019-03-05 DIAGNOSIS — E039 Hypothyroidism, unspecified: Secondary | ICD-10-CM | POA: Diagnosis not present

## 2019-03-05 DIAGNOSIS — Z23 Encounter for immunization: Secondary | ICD-10-CM | POA: Diagnosis not present

## 2019-03-05 DIAGNOSIS — K838 Other specified diseases of biliary tract: Secondary | ICD-10-CM | POA: Diagnosis not present

## 2019-03-05 DIAGNOSIS — E6609 Other obesity due to excess calories: Secondary | ICD-10-CM | POA: Diagnosis not present

## 2019-03-05 DIAGNOSIS — Z8719 Personal history of other diseases of the digestive system: Secondary | ICD-10-CM | POA: Diagnosis not present

## 2019-03-05 DIAGNOSIS — F339 Major depressive disorder, recurrent, unspecified: Secondary | ICD-10-CM | POA: Diagnosis not present

## 2019-03-12 DIAGNOSIS — F419 Anxiety disorder, unspecified: Secondary | ICD-10-CM | POA: Diagnosis not present

## 2019-03-12 DIAGNOSIS — I10 Essential (primary) hypertension: Secondary | ICD-10-CM | POA: Diagnosis not present

## 2019-03-12 DIAGNOSIS — F5101 Primary insomnia: Secondary | ICD-10-CM | POA: Diagnosis not present

## 2019-03-12 DIAGNOSIS — E039 Hypothyroidism, unspecified: Secondary | ICD-10-CM | POA: Diagnosis not present

## 2019-04-01 DIAGNOSIS — Z3041 Encounter for surveillance of contraceptive pills: Secondary | ICD-10-CM | POA: Diagnosis not present

## 2019-04-01 DIAGNOSIS — R87810 Cervical high risk human papillomavirus (HPV) DNA test positive: Secondary | ICD-10-CM | POA: Diagnosis not present

## 2019-04-01 DIAGNOSIS — Z01419 Encounter for gynecological examination (general) (routine) without abnormal findings: Secondary | ICD-10-CM | POA: Diagnosis not present

## 2019-04-01 DIAGNOSIS — Z6836 Body mass index (BMI) 36.0-36.9, adult: Secondary | ICD-10-CM | POA: Diagnosis not present

## 2019-04-01 DIAGNOSIS — Z1231 Encounter for screening mammogram for malignant neoplasm of breast: Secondary | ICD-10-CM | POA: Diagnosis not present

## 2019-04-01 DIAGNOSIS — Z1151 Encounter for screening for human papillomavirus (HPV): Secondary | ICD-10-CM | POA: Diagnosis not present

## 2019-06-11 DIAGNOSIS — Z23 Encounter for immunization: Secondary | ICD-10-CM | POA: Diagnosis not present

## 2019-06-11 DIAGNOSIS — F5101 Primary insomnia: Secondary | ICD-10-CM | POA: Diagnosis not present

## 2019-06-11 DIAGNOSIS — I10 Essential (primary) hypertension: Secondary | ICD-10-CM | POA: Diagnosis not present

## 2019-06-11 DIAGNOSIS — E6609 Other obesity due to excess calories: Secondary | ICD-10-CM | POA: Diagnosis not present

## 2019-06-11 DIAGNOSIS — K838 Other specified diseases of biliary tract: Secondary | ICD-10-CM | POA: Diagnosis not present

## 2019-06-11 DIAGNOSIS — Z8719 Personal history of other diseases of the digestive system: Secondary | ICD-10-CM | POA: Diagnosis not present

## 2019-06-11 DIAGNOSIS — E039 Hypothyroidism, unspecified: Secondary | ICD-10-CM | POA: Diagnosis not present

## 2019-06-11 DIAGNOSIS — F339 Major depressive disorder, recurrent, unspecified: Secondary | ICD-10-CM | POA: Diagnosis not present

## 2019-06-11 DIAGNOSIS — F419 Anxiety disorder, unspecified: Secondary | ICD-10-CM | POA: Diagnosis not present

## 2019-06-25 DIAGNOSIS — H16203 Unspecified keratoconjunctivitis, bilateral: Secondary | ICD-10-CM | POA: Diagnosis not present

## 2019-11-14 DIAGNOSIS — G4700 Insomnia, unspecified: Secondary | ICD-10-CM | POA: Insufficient documentation

## 2019-11-14 DIAGNOSIS — E039 Hypothyroidism, unspecified: Secondary | ICD-10-CM | POA: Insufficient documentation

## 2019-11-14 DIAGNOSIS — Z1211 Encounter for screening for malignant neoplasm of colon: Secondary | ICD-10-CM | POA: Diagnosis not present

## 2019-11-14 DIAGNOSIS — I10 Essential (primary) hypertension: Secondary | ICD-10-CM | POA: Diagnosis not present

## 2019-11-14 DIAGNOSIS — C8190 Hodgkin lymphoma, unspecified, unspecified site: Secondary | ICD-10-CM | POA: Diagnosis not present

## 2019-11-27 DIAGNOSIS — Z8571 Personal history of Hodgkin lymphoma: Secondary | ICD-10-CM | POA: Diagnosis not present

## 2019-11-27 DIAGNOSIS — Z9189 Other specified personal risk factors, not elsewhere classified: Secondary | ICD-10-CM | POA: Diagnosis not present

## 2019-11-27 DIAGNOSIS — I10 Essential (primary) hypertension: Secondary | ICD-10-CM | POA: Diagnosis not present

## 2019-11-27 DIAGNOSIS — R0600 Dyspnea, unspecified: Secondary | ICD-10-CM | POA: Diagnosis not present

## 2019-11-27 DIAGNOSIS — Z08 Encounter for follow-up examination after completed treatment for malignant neoplasm: Secondary | ICD-10-CM | POA: Diagnosis not present

## 2019-11-27 DIAGNOSIS — C8190 Hodgkin lymphoma, unspecified, unspecified site: Secondary | ICD-10-CM | POA: Diagnosis not present

## 2019-11-27 DIAGNOSIS — E039 Hypothyroidism, unspecified: Secondary | ICD-10-CM | POA: Diagnosis not present

## 2019-11-27 DIAGNOSIS — Z79899 Other long term (current) drug therapy: Secondary | ICD-10-CM | POA: Diagnosis not present

## 2019-11-27 DIAGNOSIS — M797 Fibromyalgia: Secondary | ICD-10-CM | POA: Diagnosis not present

## 2019-12-03 DIAGNOSIS — Z01812 Encounter for preprocedural laboratory examination: Secondary | ICD-10-CM | POA: Diagnosis not present

## 2019-12-03 DIAGNOSIS — R05 Cough: Secondary | ICD-10-CM | POA: Diagnosis not present

## 2019-12-03 DIAGNOSIS — J309 Allergic rhinitis, unspecified: Secondary | ICD-10-CM | POA: Diagnosis not present

## 2019-12-03 DIAGNOSIS — R0602 Shortness of breath: Secondary | ICD-10-CM | POA: Diagnosis not present

## 2019-12-03 DIAGNOSIS — Z20822 Contact with and (suspected) exposure to covid-19: Secondary | ICD-10-CM | POA: Diagnosis not present

## 2019-12-18 DIAGNOSIS — R0602 Shortness of breath: Secondary | ICD-10-CM | POA: Diagnosis not present

## 2019-12-18 DIAGNOSIS — C8190 Hodgkin lymphoma, unspecified, unspecified site: Secondary | ICD-10-CM | POA: Diagnosis not present

## 2019-12-18 DIAGNOSIS — I10 Essential (primary) hypertension: Secondary | ICD-10-CM | POA: Diagnosis not present

## 2020-01-01 DIAGNOSIS — C8190 Hodgkin lymphoma, unspecified, unspecified site: Secondary | ICD-10-CM | POA: Diagnosis not present

## 2020-01-01 DIAGNOSIS — R0602 Shortness of breath: Secondary | ICD-10-CM | POA: Diagnosis not present

## 2020-01-08 DIAGNOSIS — Z6835 Body mass index (BMI) 35.0-35.9, adult: Secondary | ICD-10-CM | POA: Insufficient documentation

## 2020-04-14 ENCOUNTER — Telehealth (HOSPITAL_COMMUNITY): Payer: Self-pay | Admitting: Vascular Surgery

## 2020-04-14 NOTE — Telephone Encounter (Signed)
Left pt message to make f/u appt w/ db next ava in DEC

## 2020-05-23 NOTE — Progress Notes (Signed)
[facility] CONSULT  NOTE  Patient ID: [name] [name], female   DOB: 1971-01-22, 49 y.o.   MRN: [id number] Primary Care: Dr. [name] Oncologist: Previously Everardo All  HPI: [name] [name] is a 49 yo female with fibromyalgia, HTN and stage IIA. bulky mediastinal nodular sclerosing Hodgkin's disease that presented with a large anterior mediastinal mass as well as left cervical and right cervical lymph nodes (sept 2013) referred by Dr. Nevada Crane.  She has been treated with ABVD (Adriamycin, Bleomycin, Vinblastine, Dacarbazine) but with incomplete response both after cycles 3 and cycles 5 and was switched to dose adjusted BEACOPP (Bleomycin, Etoposide, Doxorubcin, Cyclophoshamide, Vincristine, Procarbazine, Predinsone) starting on 08/28/2012. Finished treatment June 2014. Had radiation to her chest.   She has done well from an oncologic standpoint.   We saw her last in 2/19 for medical clearance for oral surgery. Was having constant chest heaviness at all times since chemo. We cleared her for surgery and she did well. Returns for f/u. Says she continues to have chest pressure all the time and now with progressive SOB and tachycardia. SOB with any activity and at rest. Never smoked. Minimal nocturnal LE edema. No fevers or chills. + wheezing. Saw Dr. Viann Shove in 6/21 (Pulmoanry at [facility]) PFTs and CXR felt to be ok.   Echo at [facility] 6/21 EF 60-65%. Normal diastolic function. No significant valvular disease noted. Personally reviewed   ECHOs 3/14: EF 65-70%, grade I DD 8/15: EF 60-65%, grade I DD, normal filling pressures, GS -19.9, lateral s' 11.8  3/17 CT chest no coronary calcification. 7/17 Stress echo  normal      Past Medical History:  Diagnosis Date  . Anemia    As a child and while pregnant  . Cancer ([place])    IIA Hodgkin's disease classic nodular sclerosing type  . Choledocholithiasis 04/05/2016   s/p ERCP, sphincterotomy, and stent placement  . Depression    parents  died close together "was hard"  . Dyspnea    all the time .".due to the mass"  . Eczema   . Fibromyalgia   . Headache(784.0)    Birth control pills help  . Hemorrhoid   . History of radiation therapy 10/28/12-11/20/12   chest & neck,34Gy/33fx  . Hypertension    recently dx with hodgkins  . Restless leg   . S/P cholecystectomy 09/14/2013   08/29/2013 by Dr. Arnoldo Morale.  Negative pathology  . Sleep apnea    Sleep Study- 1996- "little apnea- no tx"; recent study showed the same in 2017    Current Outpatient Medications  Medication Sig Dispense Refill  . cyclobenzaprine (FLEXERIL) 10 MG tablet Take 10 mg by mouth at bedtime.    Marland Kitchen escitalopram (LEXAPRO) 5 MG tablet Take 5 mg by mouth daily.    . Ibuprofen-Diphenhydramine Cit (ADVIL PM PO) Take 2 tablets by mouth at bedtime as needed (pain/sleep).     Lenda Kelp FE 1/20 1-20 MG-MCG tablet Take 1 tablet by mouth daily.    Marland Kitchen levothyroxine (SYNTHROID, LEVOTHROID) 125 MCG tablet Take 125 mcg by mouth daily before breakfast.    . losartan (COZAAR) 50 MG tablet Take 50 mg by mouth daily.    . piroxicam (FELDENE) 20 MG capsule Take 1 capsule by mouth daily as needed (plantar facitis.).     Marland Kitchen temazepam (RESTORIL) 15 MG capsule Take 15 mg by mouth at bedtime.      No current facility-administered medications for this encounter.   Facility-Administered Medications Ordered in Other Encounters  Medication Dose Route Frequency Provider Last Rate Last Admin  . sodium chloride 0.9 % injection 10 mL  10 mL Intravenous PRN Everardo All, MD   10 mL at 10/03/12 1643    Allergies  Allergen Reactions  . Other Other (See Comments)    Patient can not have straight oxygen due to reaction with chemo medications.   Marland Kitchen Sulfur Anaphylaxis      Social History   Socioeconomic History  . Marital status: Married    Spouse name: Not on file  . Number of children: Not on file  . Years of education: Not on file  . Highest education level: Not on file   Occupational History  . Occupation: Engineering geologist  Tobacco Use  . Smoking status: Never Smoker  . Smokeless tobacco: Never Used  Substance and Sexual Activity  . Alcohol use: Yes    Alcohol/week: 0.0 standard drinks    Comment: occasionaly 2 x month; rarely  . Drug use: No  . Sexual activity: Yes    Birth control/protection: Pill  Other Topics Concern  . 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 Lives in [place] with husband.    Social Determinants of Health   Financial Resource Strain: Not on file  Food Insecurity: Not on file  Transportation Needs: Not on file  Physical Activity: Not on file  Stress: Not on file  Social Connections: Not on file  Intimate Partner Violence: Not on file      Family History  Problem Relation Age of Onset  . Cancer Paternal Uncle   . Cancer Maternal Grandmother        lung ca mets to throat  . Colon cancer Neg Hx     Vitals:   05/24/20 1125  BP: (!) 140/100  Pulse: (!) 120  SpO2: 98%  Weight: 99.3 kg (219 lb)   Wt Readings from Last 3 Encounters:  05/24/20 99.3 kg (219 lb)  09/20/17 96.2 kg (212 lb)  07/30/17 94.3 kg (208 lb)    ECG Sinus tach 118 non-specific ST depression Personally reviewed   PHYSICAL EXAM: General:  SOB at rest HEENT: normal Neck: supple. no JVD. Carotids 2+ bilat; no bruits. No lymphadenopathy or thryomegaly appreciated. Cor: PMI nondisplaced. Tachy regular  No rubs, gallops or murmurs. Lungs: clear Abdomen: obese soft, nontender, nondistended. No hepatosplenomegaly. No bruits or masses. Good bowel sounds. Extremities: no cyanosis, clubbing, rash, edema Neuro: alert & orientedx3, cranial nerves grossly intact. moves all 4 extremities w/o difficulty. Affect pleasant  ReDS 22% (low lung water)  ASSESSMENT & PLAN:  1. Dyspnea, severe - no clear etiology. Symptoms out of proportion to clinic findings - CXR and PFTs ok with Pulmonary. Echo in 6/21 ok   - ReDS with low lung water.  - Exam ok - Will repeat echo  - Check CT scan with contrast  - If CT negative consider R/L cath  2. HTN - BP moderately elevated  - followed by PCP  3. Snoring - Sleep study negative per patient (02/2016)  4. CP - persistent. Stress echo 2017 was normal - if CT negative consider R/L cath  5. H/o hodgkin's - no evidence of chemo-induced cardio-toxicity  Total time spent 35 minutes. Over half that time spent discussing above.    Glori Bickers MD 7:39 PM

## 2020-05-24 ENCOUNTER — Other Ambulatory Visit: Payer: Self-pay

## 2020-05-24 ENCOUNTER — Encounter (HOSPITAL_COMMUNITY): Payer: Self-pay | Admitting: Internal Medicine

## 2020-05-24 ENCOUNTER — Ambulatory Visit (HOSPITAL_COMMUNITY)
Admission: RE | Admit: 2020-05-24 | Discharge: 2020-05-24 | Disposition: A | Discharge status: Home / Self Care | Payer: BC Managed Care – PPO | Source: Ambulatory Visit | Attending: Internal Medicine | Admitting: Internal Medicine

## 2020-05-24 VITALS — BP 140/100 | HR 120 | Wt 219.0 lb

## 2020-05-24 DIAGNOSIS — Z801 Family history of malignant neoplasm of trachea, bronchus and lung: Secondary | ICD-10-CM | POA: Insufficient documentation

## 2020-05-24 DIAGNOSIS — R0600 Dyspnea, unspecified: Secondary | ICD-10-CM

## 2020-05-24 DIAGNOSIS — M797 Fibromyalgia: Secondary | ICD-10-CM | POA: Diagnosis not present

## 2020-05-24 DIAGNOSIS — Z8571 Personal history of Hodgkin lymphoma: Secondary | ICD-10-CM | POA: Insufficient documentation

## 2020-05-24 DIAGNOSIS — Z79899 Other long term (current) drug therapy: Secondary | ICD-10-CM | POA: Diagnosis not present

## 2020-05-24 DIAGNOSIS — R079 Chest pain, unspecified: Secondary | ICD-10-CM

## 2020-05-24 DIAGNOSIS — R0683 Snoring: Secondary | ICD-10-CM | POA: Diagnosis not present

## 2020-05-24 DIAGNOSIS — R0789 Other chest pain: Secondary | ICD-10-CM | POA: Insufficient documentation

## 2020-05-24 DIAGNOSIS — I10 Essential (primary) hypertension: Secondary | ICD-10-CM | POA: Diagnosis not present

## 2020-05-24 DIAGNOSIS — R000 Tachycardia, unspecified: Secondary | ICD-10-CM | POA: Diagnosis not present

## 2020-05-24 DIAGNOSIS — Z791 Long term (current) use of non-steroidal anti-inflammatories (NSAID): Secondary | ICD-10-CM | POA: Diagnosis not present

## 2020-05-24 DIAGNOSIS — R0602 Shortness of breath: Secondary | ICD-10-CM | POA: Diagnosis not present

## 2020-05-24 DIAGNOSIS — R0609 Other forms of dyspnea: Secondary | ICD-10-CM

## 2020-05-24 LAB — BASIC METABOLIC PANEL
Anion gap: 10 (ref 5–15)
BUN: 10 mg/dL (ref 6–20)
CO2: 21 mmol/L — ABNORMAL LOW (ref 22–32)
Calcium: 8.9 mg/dL (ref 8.9–10.3)
Chloride: 104 mmol/L (ref 98–111)
Creatinine, Ser: 0.86 mg/dL (ref 0.44–1.00)
GFR, Estimated: >60 mL/min (ref >60)
Glucose, Bld: 139 mg/dL — ABNORMAL HIGH (ref 70–99)
Potassium: 3.7 mmol/L (ref 3.5–5.1)
Sodium: 135 mmol/L (ref 135–145)

## 2020-05-24 NOTE — Progress Notes (Signed)
ReDS Vest / Clip - 05/24/20 1200      ReDS Vest / Clip   Station Marker C    Ruler Value 35    ReDS Value Range Low volume    ReDS Actual Value 22

## 2020-05-24 NOTE — Patient Instructions (Signed)
Labs done today, your results will be available in MyChart, we will contact you for abnormal readings.  Your physician has requested that you have an echocardiogram. Echocardiography is a painless test that uses sound waves to create images of your heart. It provides your doctor with information about the size and shape of your heart and how well your heart's chambers and valves are working. This procedure takes approximately one hour. There are no restrictions for this procedure.  Non-Cardiac CT scanning, (CAT scanning), is a noninvasive, special x-ray that produces cross-sectional images of the body using x-rays and a computer. CT scans help physicians diagnose and treat medical conditions. For some CT exams, a contrast material is used to enhance visibility in the area of the body being studied. CT scans provide greater clarity and reveal more details than regular x-ray exams.  Please call our office in March 2022 to schedule your follow up appointment  If you have any questions or concerns before your next appointment please send [place] a message through [place] or call our office at [phone].    TO LEAVE A MESSAGE FOR THE NURSE SELECT OPTION 2, PLEASE LEAVE A MESSAGE INCLUDING: . YOUR NAME . DATE OF BIRTH . CALL BACK NUMBER . REASON FOR CALL**this is important as we prioritize the call backs  [place] AS LONG AS YOU CALL BEFORE 4:00 PM  At the [facility], you and your health needs are our priority. As part of our continuing mission to provide you with exceptional heart care, we have created designated Provider Care Teams. These Care Teams include your primary Cardiologist (physician) and Advanced Practice Providers (APPs- Physician Assistants and Nurse Practitioners) who all work together to provide you with the care you need, when you need it.   You may see any of the following providers on your designated Care Team at your next follow  up: Marland Kitchen Dr Glori Bickers . Dr Loralie Champagne . Darrick Grinder, NP . Lyda Jester, PA . Audry Riles, PharmD   Please be sure to bring in all your medications bottles to every appointment.

## 2020-05-25 ENCOUNTER — Telehealth (HOSPITAL_COMMUNITY): Payer: Self-pay | Admitting: *Deleted

## 2020-05-25 NOTE — Telephone Encounter (Signed)
Pt's Chest CTA sch for Thur 12/16 at 4:30 at [facility] # [id number], echo sch for Fri 12/17 at 1 pm at [facility], pt aware of both, agreeable, and verbalizes understanding to be NPO 4 hours prior to CTA

## 2020-05-25 NOTE — Addendum Note (Signed)
Encounter addended by: Scarlette Calico, RN on: 05/25/2020 8:48 AM  Actions taken: Order list changed, Diagnosis association updated

## 2020-05-27 ENCOUNTER — Ambulatory Visit (HOSPITAL_COMMUNITY)
Admission: RE | Admit: 2020-05-27 | Discharge: 2020-05-27 | Disposition: A | Discharge status: Home / Self Care | Payer: BC Managed Care – PPO | Source: Ambulatory Visit | Attending: Internal Medicine | Admitting: Internal Medicine

## 2020-05-27 ENCOUNTER — Other Ambulatory Visit: Payer: Self-pay

## 2020-05-27 ENCOUNTER — Encounter (HOSPITAL_COMMUNITY): Payer: Self-pay

## 2020-05-27 DIAGNOSIS — R079 Chest pain, unspecified: Secondary | ICD-10-CM | POA: Diagnosis present

## 2020-05-27 MED ORDER — IOHEXOL 350 MG/ML SOLN
100.0000 mL | Freq: Once | INTRAVENOUS | Status: AC | PRN
  Administered 2020-05-27: 100 mL via INTRAVENOUS

## 2020-05-27 MED ORDER — SODIUM CHLORIDE (PF) 0.9 % IJ SOLN
INTRAMUSCULAR | Status: AC
  Filled 2020-05-27: qty 50

## 2020-05-28 ENCOUNTER — Ambulatory Visit (HOSPITAL_COMMUNITY)
Admission: RE | Admit: 2020-05-28 | Discharge: 2020-05-28 | Disposition: A | Discharge status: Home / Self Care | Payer: BC Managed Care – PPO | Source: Ambulatory Visit | Attending: Internal Medicine | Admitting: Internal Medicine

## 2020-05-28 ENCOUNTER — Ambulatory Visit (HOSPITAL_BASED_OUTPATIENT_CLINIC_OR_DEPARTMENT_OTHER): Payer: Commercial Managed Care - PPO

## 2020-05-28 ENCOUNTER — Telehealth (HOSPITAL_COMMUNITY): Payer: Self-pay

## 2020-05-28 DIAGNOSIS — R079 Chest pain, unspecified: Secondary | ICD-10-CM | POA: Diagnosis present

## 2020-05-28 DIAGNOSIS — R0600 Dyspnea, unspecified: Secondary | ICD-10-CM | POA: Diagnosis not present

## 2020-05-28 DIAGNOSIS — R0609 Other forms of dyspnea: Secondary | ICD-10-CM

## 2020-05-28 LAB — ECHOCARDIOGRAM COMPLETE
Area-P 1/2: 3.02 cm2
S' Lateral: 2.3 cm

## 2020-05-28 NOTE — Telephone Encounter (Signed)
-----   Message from Jolaine Artist, MD sent at 05/28/2020  9:14 AM EST ----- Normal

## 2020-05-28 NOTE — Telephone Encounter (Signed)
Patient aware.

## 2020-05-28 NOTE — Progress Notes (Signed)
  Echocardiogram 2D Echocardiogram has been performed.  [name] [name] 05/28/2020, 1:42 PM

## 2020-06-22 ENCOUNTER — Ambulatory Visit: Payer: Self-pay | Admitting: Allergy

## 2021-04-06 ENCOUNTER — Other Ambulatory Visit (HOSPITAL_COMMUNITY): Payer: Self-pay

## 2021-04-06 MED ORDER — INFLUENZA VAC SPLIT QUAD 0.5 ML IM SUSY
PREFILLED_SYRINGE | INTRAMUSCULAR | 0 refills | Status: DC
Start: 2021-04-06 — End: 2021-08-16
  Filled 2021-04-06: qty 0.5, 1d supply, fill #0

## 2021-08-16 ENCOUNTER — Ambulatory Visit (INDEPENDENT_AMBULATORY_CARE_PROVIDER_SITE_OTHER): Payer: BC Managed Care – PPO | Admitting: Medical-Surgical

## 2021-08-16 ENCOUNTER — Other Ambulatory Visit: Payer: Self-pay

## 2021-08-16 ENCOUNTER — Encounter: Payer: Self-pay | Admitting: Medical-Surgical

## 2021-08-16 VITALS — BP 150/89 | HR 100 | Resp 20 | Ht 66.5 in | Wt 203.9 lb

## 2021-08-16 DIAGNOSIS — G43709 Chronic migraine without aura, not intractable, without status migrainosus: Secondary | ICD-10-CM | POA: Diagnosis not present

## 2021-08-16 DIAGNOSIS — I10 Essential (primary) hypertension: Secondary | ICD-10-CM | POA: Diagnosis not present

## 2021-08-16 DIAGNOSIS — Z7689 Persons encountering health services in other specified circumstances: Secondary | ICD-10-CM

## 2021-08-16 DIAGNOSIS — M797 Fibromyalgia: Secondary | ICD-10-CM | POA: Diagnosis not present

## 2021-08-16 DIAGNOSIS — F5101 Primary insomnia: Secondary | ICD-10-CM

## 2021-08-16 DIAGNOSIS — F3341 Major depressive disorder, recurrent, in partial remission: Secondary | ICD-10-CM

## 2021-08-16 MED ORDER — TEMAZEPAM 15 MG PO CAPS: 15.0000 mg | ORAL_CAPSULE | Freq: Every day | ORAL | 3 refills | Status: DC

## 2021-08-16 MED ORDER — CYCLOBENZAPRINE HCL 10 MG PO TABS: 10.0000 mg | ORAL_TABLET | Freq: Every day | ORAL | 5 refills | Status: DC

## 2021-08-16 MED ORDER — VALSARTAN 160 MG PO TABS: 160.0000 mg | ORAL_TABLET | Freq: Every day | ORAL | 0 refills | Status: DC

## 2021-08-16 MED ORDER — NURTEC 75 MG PO TBDP: 1.0000 | ORAL_TABLET | Freq: Every day | ORAL | 3 refills | Status: DC | PRN

## 2021-08-16 MED ORDER — GABAPENTIN 300 MG PO CAPS: 300.0000 mg | ORAL_CAPSULE | Freq: Three times a day (TID) | ORAL | 0 refills | Status: DC

## 2021-08-16 NOTE — Progress Notes (Signed)
New Patient Office Visit  Subjective:  Patient ID: [name] [name], female    DOB: 1970/09/24  Age: 51 y.o. MRN: [id number]  CC:  Chief Complaint  Patient presents with   Establish Care     HPI [name] [name] presents to establish care.  He is a pleasant 51 year old female who currently works at [employer].  She presents with concerns as follows:  Fibromyalgia-previously treated fibromyalgia with CBD oil which worked very well and resolved all of her pain.  Unfortunately since she started at [facility] [employer], she is concerned about possibly losing her job since there are some CBD oils that will show up in the urine.  Notes that her fibromyalgia symptoms got worse after she had radiation for cancer.  She has been taking Advil "like M&Ms" and is aware that taking too much of this is bad for the kidneys but is having difficulty with the aches and pains on a daily basis.  This is starting to affect her everyday ability to function and her enjoyment of time spent with her family and grandchildren.  She notes that most of her pain is centered around her back from the shoulders to the pelvis.  She used to be active however the pain is gotten so bad she is unable to tolerate even light activity.  Previously tried gabapentin as well as Effexor to help with fibromyalgia type pains.  Notes that gabapentin caused some brain fog but she was forced to stop this because of interactions with her chemotherapy regimen.  She is hesitant to consider Lyrica since a friend of hers had horrible side effects from the medication.  Has not tried amitriptyline.  She does use Flexeril 10 mg on an as-needed basis which does seem to relax the muscles at night.  Hypertension-taking losartan 100 mg daily, tolerating well without side effects.  Has a blood pressure cuff at home but does not routinely measure her blood pressure.  Notes that her blood pressure has stayed higher since she had chemotherapy and radiation.  Notes her  cardiologist (last seen 8 to 9 months ago (felt that her blood pressure would never get to normal levels due to hardening of of the vasculature after her chemotherapy.  Insomnia-has been treated with temazepam 15 mg nightly as needed since that she was in her late teens/early 27s.  Notes that she has had insomnia for most of her life and this is the medication that seem to work best for her.  Migraines-has been treated with rizatriptan which was somewhat helpful for her migraines however she did get some samples of Nurtec and notes that it works a lot faster and allows her to get back to work sooner.  She would like to have the rizatriptan discontinued and changed to Nurtec for her abortive agent of choice.  Continues to take oral birth control as this was originally started for migraine prophylaxis and she is now scared to come off of it since she is still having approximately 4 migraine days per month.  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 JArnoldo Morale  Negative pathology   Sleep apnea    Sleep Study- 1996- "little apnea- no tx"; recent study showed the same in 2017    Past Surgical History:  Procedure Laterality Date   BONE MARROW BIOPSY  03/20/2012   CARDIOVASCULAR STRESS TEST  11/2015   CHOLECYSTECTOMY N/A 08/29/2013   Procedure: LAPAROSCOPIC CHOLECYSTECTOMY;  Surgeon: MJamesetta So  MD;  Location: [facility];  Service: General;  Laterality: N/A;   DILATION AND CURETTAGE OF UTERUS  1991   ERCP N/A 01/12/2016   Procedure: ENDOSCOPIC RETROGRADE CHOLANGIOPANCREATOGRAPHY (ERCP);  Surgeon: SDanie Binder MD;  Location: [facility];  Service: Endoscopy;  Laterality: N/A;   ERCP N/A 04/05/2016   Procedure: ENDOSCOPIC RETROGRADE CHOLANGIOPANCREATOGRAPHY (ERCP), STONE BASKET EXTRACTION;  Surgeon: NRogene Houston MD;  Location: [facility];  Service: Endoscopy;  Laterality: N/A;   Mediastinal Mass Biopsy  03/05/12   PANCREATIC STENT PLACEMENT N/A 04/05/2016   Procedure: PANCREATIC STENT PLACEMENT;  Surgeon: NRogene Houston MD;  Location: [facility];  Service: Endoscopy;  Laterality: N/A;   PORT-A-CATH REMOVAL Left 10/16/2012   Procedure: REMOVAL PORT-A-CATH;  Surgeon: SMelrose Nakayama MD;  [place] [facility] [place]  Service: Thoracic;  Laterality: Left;   PORTACATH PLACEMENT  03/26/2012   Procedure: INSERTION PORT-A-CATH;  Surgeon: SMelrose Nakayama MD;  [place] [facility]  Service: Thoracic;  Laterality: N/A;   REMOVAL OF STONES N/A 01/12/2016   Procedure: REMOVAL OF STONES;  Surgeon: SDanie Binder MD;  [facility];  Service: Endoscopy;  Laterality: N/A;   SPHINCTEROTOMY N/A 01/12/2016   Procedure: SPHINCTEROTOMY;  Surgeon: SDanie Binder MD;  [facility];  Service: Endoscopy;  Laterality: N/A;   SPHINCTEROTOMY N/A 04/05/2016   Procedure: SPHINCTEROTOMY;  Surgeon: NRogene Houston MD;  [facility];  Service: Endoscopy;  Laterality: N/A;   WISDOM TOOTH EXTRACTION      Family History  Problem Relation Age of Onset   Cancer Paternal Uncle    Cancer Maternal Grandmother        lung ca mets to throat   Colon cancer Neg Hx     Social History   Socioeconomic History   Marital status: Married    Spouse name: Not on file   Number of children: Not on file   Years of education: Not on file   Highest education level: Not on file  Occupational  History   Occupation: pEngineering geologist Tobacco Use   Smoking status: Never   Smokeless tobacco: Never  Substance and Sexual Activity   Alcohol use: Yes    Alcohol/week: 0.0 standard drinks    Comment: occasionaly 2 x month; rarely   Drug use: No   Sexual activity: Yes    Birth control/protection: Pill  Other Topics Concern   Not on file  Social History Narrative   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 [place]    Social Determinants of Health   Financial Resource Strain: Not on file  Food Insecurity: Not on file  Transportation Needs: Not on file  Physical Activity: Not on file  Stress: Not on file  Social Connections: Not on file  Intimate Partner Violence: Not on file    ROS Review of Systems  Constitutional:  Negative for chills, fatigue, fever and unexpected weight change.  HENT:  Negative for congestion, rhinorrhea, sinus pressure and sore throat.   Respiratory:  Negative for cough, chest tightness and shortness of breath.   Cardiovascular:  Negative for chest pain, palpitations and leg swelling.  Gastrointestinal:  Negative for abdominal pain, constipation, diarrhea, nausea and vomiting.  Endocrine: Negative for cold intolerance and heat intolerance.  Genitourinary:  Negative for dysuria, frequency, urgency, vaginal bleeding and vaginal discharge.  Musculoskeletal:  Positive for arthralgias, back pain and myalgias.  Skin:  Negative for rash and wound.  Neurological:  Positive for headaches. Negative for dizziness and light-headedness.  Hematological:  Does not bruise/bleed easily.  Psychiatric/Behavioral:  Positive for dysphoric mood and sleep disturbance. Negative for self-injury and suicidal ideas. The patient is not nervous/anxious.    Objective:   Today's Vitals: BP (!) 150/89 (BP Location: Right Arm, Patient Position: Sitting, Cuff Size: Normal)    Pulse 100    Resp 20    Ht 5' 6.5" (1.689 m)    Wt 203 lb 14.4 oz (92.5 kg)     LMP 02/11/2012 Comment: no period (chemo)   SpO2 98%    BMI 32.42 kg/m   Physical Exam Vitals reviewed.  Constitutional:      General: She is not in acute distress.    Appearance: Normal appearance. She is obese. She is not ill-appearing.  HENT:     Head: Normocephalic and atraumatic.  Cardiovascular:     Rate and Rhythm: Normal rate and regular rhythm.     Pulses: Normal pulses.     Heart sounds: Normal heart sounds. No murmur heard.   No friction rub. No gallop.  Pulmonary:     Effort: Pulmonary effort is normal. No respiratory distress.     Breath sounds: Normal breath sounds. No wheezing.  Skin:    General: Skin is warm and dry.  Neurological:     Mental Status: She is alert and oriented to person, place, and time.  Psychiatric:        Mood and Affect: Mood normal.        Behavior: Behavior normal.        Thought Content: Thought content normal.        Judgment: Judgment normal.    Assessment & Plan:   1. Encounter to establish care Reviewed available information and discussed care concerns with patient.   2. Fibromyalgia Continue Flexeril 10 mg at bedtime as needed.  Restarting gabapentin 300 mg 3 times daily.  Advised that she is welcome to take 300 mg in the morning with 600 mg at night if this is most convenient for her.  Reviewed various other options for management of fibromyalgia pain including amitriptyline, Effexor, Cymbalta, and Lyrica. - cyclobenzaprine (FLEXERIL) 10 MG tablet; Take 1 tablet (10 mg total) by mouth at bedtime.  Dispense: 90 tablet; Refill: 5 - gabapentin (NEURONTIN) 300 MG capsule; Take 1 capsule (300 mg total) by mouth 3 (three) times daily.  Dispense: 270 capsule; Refill: 0  3. Primary hypertension Blood pressure is not well controlled at 150/89.  Would like to switch her from losartan to valsartan 160 mg daily.  Recommend checking blood pressure at home with goal of 130/80 or less.  I will reach out to her in a couple of weeks to see how her  blood pressures have been doing at home and adjust dosage if  necessary.  4. Recurrent major depressive disorder, in partial remission ([place]) Not currently treated with any medications.  She does have some significant issues with depression regarding chronic pain with fibromyalgia and limitations on her enjoyment of everyday activities.  Would recommend trialing Cymbalta, Effexor, or amitriptyline if gabapentin is helpful with controlling her pain.  These are likely to help with mood management as well as generalized discomfort.  5. Primary insomnia Continue temazepam 15 mg nightly as needed. - temazepam (RESTORIL) 15 MG capsule; Take 1 capsule (15 mg total) by mouth at bedtime.  Dispense: 30 capsule; Refill: 3  6. Chronic migraine without aura without status migrainosus, not intractable Sending in Nurtec 75 mg daily as needed for abortive migraine treatment. - Rimegepant Sulfate (NURTEC) 75 MG TBDP; Take 1 tablet by mouth daily as needed.  Dispense: 10 tablet; Refill: 3  Outpatient Encounter Medications as of 08/16/2021  Medication Sig   cyclobenzaprine (FLEXERIL) 10 MG tablet Take 1 tablet (10 mg total) by mouth at bedtime.   gabapentin (NEURONTIN) 300 MG capsule Take 1 capsule (300 mg total) by mouth 3 (three) times daily.   Ibuprofen-Diphenhydramine Cit (ADVIL PM PO) Take 2 tablets by mouth at bedtime as needed (pain/sleep).    JUNEL FE 1/20 1-20 MG-MCG tablet Take 1 tablet by mouth daily.   levothyroxine (SYNTHROID) 137 MCG tablet Take 137 mcg by mouth daily before breakfast.   Rimegepant Sulfate (NURTEC) 75 MG TBDP Take 1 tablet by mouth daily as needed.   temazepam (RESTORIL) 15 MG capsule Take 1 capsule (15 mg total) by mouth at bedtime.   valsartan (DIOVAN) 160 MG tablet Take 1 tablet (160 mg total) by mouth daily.   [DISCONTINUED] losartan (COZAAR) 50 MG tablet Take 50 mg by mouth daily.   [DISCONTINUED] cyclobenzaprine (FLEXERIL) 10 MG tablet Take 10 mg by mouth at bedtime.    [DISCONTINUED] influenza vac split quadrivalent PF (FLUARIX) 0.5 ML injection Inject into the muscle. (Patient not taking: Reported on 08/16/2021)   [DISCONTINUED] temazepam (RESTORIL) 15 MG capsule Take 15 mg by mouth at bedtime.   Facility-Administered Encounter Medications as of 08/16/2021  Medication   sodium chloride 0.9 % injection 10 mL    Follow-up: Return in about 6 weeks (around 09/27/2021) for fibromyalgia follow up.   Clearnce Sorrel, DNP, APRN, FNP-BC [facility]

## 2021-09-05 ENCOUNTER — Encounter: Payer: Self-pay | Admitting: Medical-Surgical

## 2021-09-06 ENCOUNTER — Telehealth: Payer: Self-pay

## 2021-09-06 MED ORDER — RIZATRIPTAN BENZOATE 10 MG PO TBDP: 10.0000 mg | ORAL_TABLET | ORAL | 3 refills | Status: DC | PRN

## 2021-09-06 NOTE — Telephone Encounter (Signed)
Patient called wanting you to send in a prescription for Maxalt because her insurance doesn't cover the medication that you prescribed for migraines. ? ?[place],  - [address] AT [address] OF [address] Phone:  [phone]  ?Fax:  [phone]  ?  ? ?

## 2021-09-07 NOTE — Telephone Encounter (Signed)
Task completed. Patient has been updated of rx request outcome. No other inquiries during the call.  ?

## 2021-09-08 ENCOUNTER — Telehealth: Payer: Self-pay

## 2021-09-08 NOTE — Telephone Encounter (Addendum)
Initiated Prior authorization for:(NURTEC) 75 MG TBDP  ?Via: Faxed to [facility] [facility] ?[phone] ?[phone] ?Case: N/A ?Status: denied as of 09/08/21 ?Reason:Criteria not met pt would have to try and fail two formulary alternatives ?Notified Pt via: Mychart ?

## 2021-09-26 LAB — HM MAMMOGRAPHY

## 2021-09-26 LAB — RESULTS CONSOLE HPV: CHL HPV: POSITIVE

## 2021-10-06 ENCOUNTER — Ambulatory Visit (INDEPENDENT_AMBULATORY_CARE_PROVIDER_SITE_OTHER): Payer: BC Managed Care – PPO | Admitting: Medical-Surgical

## 2021-10-06 ENCOUNTER — Encounter: Payer: Self-pay | Admitting: Medical-Surgical

## 2021-10-06 VITALS — BP 134/84 | HR 93 | Resp 20 | Ht 66.5 in | Wt 198.5 lb

## 2021-10-06 DIAGNOSIS — G43709 Chronic migraine without aura, not intractable, without status migrainosus: Secondary | ICD-10-CM

## 2021-10-06 DIAGNOSIS — M797 Fibromyalgia: Secondary | ICD-10-CM | POA: Diagnosis not present

## 2021-10-06 MED ORDER — GABAPENTIN 300 MG PO CAPS: 300.0000 mg | ORAL_CAPSULE | Freq: Three times a day (TID) | ORAL | 0 refills | Status: DC

## 2021-10-06 MED ORDER — BUTALBITAL-APAP-CAFFEINE 50-325-40 MG PO TABS: 1.0000 | ORAL_TABLET | Freq: Four times a day (QID) | ORAL | 0 refills | Status: DC | PRN

## 2021-10-06 MED ORDER — RIZATRIPTAN BENZOATE 10 MG PO TBDP: 10.0000 mg | ORAL_TABLET | ORAL | 3 refills | Status: DC | PRN

## 2021-10-06 NOTE — Progress Notes (Signed)
?  HPI with pertinent ROS:  ? ?CC: Fibromyalgia follow-up ? ?HPI: ?Pleasant 51 year old female presenting today for follow-up on fibromyalgia.  About 6 weeks ago, she started gabapentin 300 mg 3 times daily as needed.  Notes that she has been taking this, tolerating it well without side effects.  Reports that she takes 2-3 doses depending on how she feels each day.  Feels that the medication has helped tremendously.  For the first week or so she had to continue taking Tylenol on a regular basis but has not had to use this since then. ? ?She does have a history of migraines and has been treating it with oral contraceptives however her OB/GYN has now decided to take her off the birth control to see if she is in menopause.  Since stopping the birth control, she reports the first 7 days or horrible for a migraine that was intractable.  She has been off for 2 weeks now and over the last week, she has awakened with a headache every day that does seem to respond to Maxalt that she takes first thing in the morning.  Has tried Imitrex in the remote past but does it worked well for her.  She also notes that she took metoprolol in the past but does not feel that that worked well for her either.  Is interested in something that will help at least temporarily while she adjusts to being off of the birth control. ? ?I reviewed the past medical history, family history, social history, surgical history, and allergies today and no changes were needed.  Please see the problem list section below in epic for further details. ? ? ?Physical exam:  ? ?General: Well Developed, well nourished, and in no acute distress.  ?Neuro: Alert and oriented x3.  ?HEENT: Normocephalic, atraumatic.  ?Skin: Warm and dry. ?Cardiac: Regular rate and rhythm, no murmurs rubs or gallops, no lower extremity edema.  ?Respiratory: Clear to auscultation bilaterally. Not using accessory muscles, speaking in full sentences. ? ?Impression and Recommendations:   ? ?1.  Fibromyalgia ?Has responded very well to gabapentin so continue 300 mg 3 times daily.  Consider increasing nighttime dose to 600 mg to see if this will help with a.m. migraines. ?- gabapentin (NEURONTIN) 300 MG capsule; Take 1 capsule (300 mg total) by mouth 3 (three) times daily.  Dispense: 270 capsule; Refill: 0 ? ?2. Chronic migraine without aura without status migrainosus, not intractable ?Refilling Maxalt.  Adding as needed Fioricet. ? ?Return in about 3 months (around 01/05/2022) for Fibromyalgia/hypertension/migraine follow-up. ?___________________________________________ ?Clearnce Sorrel, DNP, APRN, FNP-BC ?Primary Care and Sports Medicine ?[place] ?

## 2021-10-11 LAB — HM PAP SMEAR: HM Pap smear: NEGATIVE

## 2021-10-21 ENCOUNTER — Encounter: Payer: Self-pay | Admitting: Medical-Surgical

## 2021-10-21 NOTE — Progress Notes (Signed)
04/17 ?

## 2021-11-09 ENCOUNTER — Other Ambulatory Visit: Payer: Self-pay | Admitting: Medical-Surgical

## 2021-12-14 ENCOUNTER — Other Ambulatory Visit: Payer: Self-pay

## 2021-12-14 DIAGNOSIS — F5101 Primary insomnia: Secondary | ICD-10-CM

## 2021-12-14 MED ORDER — TEMAZEPAM 15 MG PO CAPS: 15.0000 mg | ORAL_CAPSULE | Freq: Every day | ORAL | 0 refills | Status: DC

## 2022-01-04 NOTE — Progress Notes (Unsigned)
   Established Patient Office Visit  Subjective   Patient ID: [name] [name], female   DOB: 27-May-1971 Age: 51 y.o. MRN: [id number]   No chief complaint on file.   HPI Pleasant 51 year old female presenting today for blood pressure check.    Objective:    There were no vitals filed for this visit.  Physical Exam   No results found for this or any previous visit (from the past 24 hour(s)).   {Labs (Optional):23779}  The ASCVD Risk score (Arnett DK, et al., 2019) failed to calculate for the following reasons:   Cannot find a previous HDL lab   Assessment & Plan:   No problem-specific Assessment & Plan notes found for this encounter.   No follow-ups on file.  ___________________________________________ Clearnce Sorrel, DNP, APRN, FNP-BC [facility] [place]

## 2022-01-05 ENCOUNTER — Ambulatory Visit (INDEPENDENT_AMBULATORY_CARE_PROVIDER_SITE_OTHER): Payer: BC Managed Care – PPO | Admitting: Medical-Surgical

## 2022-01-05 ENCOUNTER — Encounter: Payer: Self-pay | Admitting: Medical-Surgical

## 2022-01-05 VITALS — BP 123/87 | HR 106 | Resp 20 | Ht 66.5 in | Wt 194.8 lb

## 2022-01-05 DIAGNOSIS — M797 Fibromyalgia: Secondary | ICD-10-CM

## 2022-01-05 DIAGNOSIS — I10 Essential (primary) hypertension: Secondary | ICD-10-CM | POA: Diagnosis not present

## 2022-01-05 DIAGNOSIS — E039 Hypothyroidism, unspecified: Secondary | ICD-10-CM | POA: Diagnosis not present

## 2022-01-05 DIAGNOSIS — F5101 Primary insomnia: Secondary | ICD-10-CM | POA: Diagnosis not present

## 2022-01-05 MED ORDER — RIZATRIPTAN BENZOATE 10 MG PO TBDP: 10.0000 mg | ORAL_TABLET | ORAL | 3 refills | Status: DC | PRN

## 2022-01-05 MED ORDER — GABAPENTIN 300 MG PO CAPS: 300.0000 mg | ORAL_CAPSULE | Freq: Three times a day (TID) | ORAL | 1 refills | Status: DC

## 2022-01-05 MED ORDER — BUTALBITAL-APAP-CAFFEINE 50-325-40 MG PO TABS: 1.0000 | ORAL_TABLET | Freq: Four times a day (QID) | ORAL | 0 refills | Status: DC | PRN

## 2022-01-05 MED ORDER — TEMAZEPAM 15 MG PO CAPS: 15.0000 mg | ORAL_CAPSULE | Freq: Every day | ORAL | 0 refills | Status: DC

## 2022-01-05 MED ORDER — CYCLOBENZAPRINE HCL 10 MG PO TABS
10.0000 mg | ORAL_TABLET | Freq: Every day | ORAL | 5 refills | Status: DC
Start: 2022-01-05 — End: 2022-04-10

## 2022-01-05 MED ORDER — VALSARTAN 160 MG PO TABS: ORAL_TABLET | ORAL | 1 refills | Status: DC

## 2022-02-14 ENCOUNTER — Other Ambulatory Visit: Payer: Self-pay | Admitting: Medical-Surgical

## 2022-02-27 ENCOUNTER — Encounter: Payer: Self-pay | Admitting: Medical-Surgical

## 2022-02-28 ENCOUNTER — Encounter: Payer: Self-pay | Admitting: Medical-Surgical

## 2022-03-23 ENCOUNTER — Other Ambulatory Visit: Payer: Self-pay

## 2022-03-23 DIAGNOSIS — M797 Fibromyalgia: Secondary | ICD-10-CM

## 2022-03-23 DIAGNOSIS — F5101 Primary insomnia: Secondary | ICD-10-CM

## 2022-03-23 MED ORDER — GABAPENTIN 300 MG PO CAPS: 300.0000 mg | ORAL_CAPSULE | Freq: Three times a day (TID) | ORAL | 1 refills | Status: DC

## 2022-03-23 MED ORDER — TEMAZEPAM 15 MG PO CAPS: 15.0000 mg | ORAL_CAPSULE | Freq: Every day | ORAL | 0 refills | Status: DC

## 2022-03-23 NOTE — Progress Notes (Signed)
Please contact her pharmacy to cancel the remaining Temazepam. Refills sent to the new pharmacy.  ___________________________________________ Clearnce Sorrel, DNP, APRN, FNP-BC [facility] [place]

## 2022-03-23 NOTE — Progress Notes (Signed)
Patient called requesting a refill on her Temazepam and gabapentin.  She is down to her last pill on the Temazepam and is needing someone to refill her medicine today.  I have updated her pharmacy.  Last office visit 01/05/2022  Upcoming appointment 04/10/2022  Last filled 01/05/2022

## 2022-03-23 NOTE — Progress Notes (Signed)
Contacted pharmacy and canceled the remaining of the Temazepam.

## 2022-04-04 ENCOUNTER — Other Ambulatory Visit (HOSPITAL_BASED_OUTPATIENT_CLINIC_OR_DEPARTMENT_OTHER): Payer: Self-pay

## 2022-04-04 MED ORDER — FLUARIX QUADRIVALENT 0.5 ML IM SUSY
PREFILLED_SYRINGE | INTRAMUSCULAR | 0 refills | Status: DC
  Filled 2022-04-04: qty 0.5, 1d supply, fill #0

## 2022-04-10 ENCOUNTER — Ambulatory Visit (INDEPENDENT_AMBULATORY_CARE_PROVIDER_SITE_OTHER): Payer: BC Managed Care – PPO | Admitting: Medical-Surgical

## 2022-04-10 ENCOUNTER — Encounter: Payer: Self-pay | Admitting: Medical-Surgical

## 2022-04-10 VITALS — BP 117/73 | HR 91 | Resp 20 | Ht 66.5 in | Wt 195.0 lb

## 2022-04-10 DIAGNOSIS — M797 Fibromyalgia: Secondary | ICD-10-CM

## 2022-04-10 DIAGNOSIS — I10 Essential (primary) hypertension: Secondary | ICD-10-CM | POA: Diagnosis not present

## 2022-04-10 DIAGNOSIS — F5101 Primary insomnia: Secondary | ICD-10-CM

## 2022-04-10 MED ORDER — MELOXICAM 15 MG PO TABS: ORAL_TABLET | ORAL | 3 refills | Status: DC

## 2022-04-10 MED ORDER — TEMAZEPAM 15 MG PO CAPS: 15.0000 mg | ORAL_CAPSULE | Freq: Every day | ORAL | 0 refills | Status: DC

## 2022-04-10 MED ORDER — GABAPENTIN 300 MG PO CAPS: 300.0000 mg | ORAL_CAPSULE | Freq: Three times a day (TID) | ORAL | 1 refills | Status: DC

## 2022-04-10 MED ORDER — CYCLOBENZAPRINE HCL 10 MG PO TABS
10.0000 mg | ORAL_TABLET | Freq: Every day | ORAL | 5 refills | Status: DC
Start: 2022-04-10 — End: 2022-08-01

## 2022-04-10 NOTE — Progress Notes (Signed)
Established Patient Office Visit  Subjective   Patient ID: [name] [name], female   DOB: 27-Nov-1970 Age: 51 y.o. MRN: [id number]   Chief Complaint  Patient presents with   Hypertension   Follow-up    HPI Pleasant 51 year old female presenting today for the following:  Insomnia: Taking temazepam 15 mg nightly as prescribed, tolerating well without side effects.  Has been on this medication long-term feels that it works well to help her sleep at night.  No excessive grogginess with the medication.  Hypertension: Taking valsartan 160 mg daily, tolerating well without side effects.  Does not regularly check blood pressure at home.  Follows a low-sodium diet.  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 Denies CP, SOB, palpitations, lower extremity edema, dizziness, headaches, or vision changes.  Chronic pain: She does have a diagnosis of fibromyalgia and is currently treated with gabapentin.  She is taking gabapentin 300 mg twice daily, tolerating well without side effects.  Notes that her pain and stiffness in the morning have gotten unbearable, mostly in her neck and back.  She is using Flexeril 10 mg at night which does help her get to sleep but does not relieve any of her symptoms in the morning.   Objective:    Vitals:   04/10/22 1548  BP: 117/73  Pulse: 91  Resp: 20  Height: 5' 6.5" (1.689 m)  Weight: 195 lb (88.5 kg)  SpO2: 100%  BMI (Calculated): 31.01    Physical Exam Vitals and nursing note reviewed.  Constitutional:      General: She is not in acute distress.    Appearance: Normal appearance. She is obese. She is not ill-appearing.  HENT:     Head: Normocephalic and atraumatic.  Cardiovascular:     Rate and Rhythm: Normal rate and regular rhythm.     Pulses: Normal pulses.     Heart sounds: Normal heart sounds.  Pulmonary:     Effort: Pulmonary  effort is normal. No respiratory distress.     Breath sounds: Normal breath sounds. No wheezing, rhonchi or rales.  Skin:    General: Skin is warm and dry.  Neurological:     Mental Status: She is alert and oriented to person, place, and time.  Psychiatric:        Mood and Affect: Mood normal.        Behavior: Behavior normal.        Thought Content: Thought content normal.        Judgment: Judgment normal.   No results found for this or any previous visit (from the past 24 hour(s)).     The ASCVD Risk score (Arnett DK, et al., 2019) failed to calculate for the following reasons:   Cannot find a previous HDL lab   Assessment & Plan:   1. Primary hypertension Pressure well controlled today.  Continue valsartan 160 mg daily.  Monitor blood pressure at home with a goal of 130/80 or less.  Recommend low-sodium diet and regular intentional exercise with a goal for weight loss.  2. Primary insomnia Well-controlled.  Continue temazepam 15 mg daily at that time. - temazepam (RESTORIL) 15 MG capsule; Take 1 capsule (15 mg total) by mouth at bedtime.  Dispense: 90 capsule; Refill: 0  3. Fibromyalgia Refilling Flexeril.  Increase gabapentin to 300 mg in the morning and 600 mg at night.  Adding  meloxicam 15 mg daily for the next 2 weeks then daily as needed. - cyclobenzaprine (FLEXERIL) 10 MG tablet; Take 1 tablet (10 mg total) by mouth at bedtime.  Dispense: 90 tablet; Refill: 5 - gabapentin (NEURONTIN) 300 MG capsule; Take 1 capsule (300 mg total) by mouth 3 (three) times daily.  Dispense: 270 capsule; Refill: 1  Return in about 3 months (around 07/11/2022) for annual physical exam.  ___________________________________________ Clearnce Sorrel, DNP, APRN, FNP-BC [facility] [place]

## 2022-05-01 ENCOUNTER — Other Ambulatory Visit: Payer: Self-pay

## 2022-05-01 DIAGNOSIS — F5101 Primary insomnia: Secondary | ICD-10-CM

## 2022-05-01 NOTE — Telephone Encounter (Signed)
Spoke with patient, she stated that her ins company makes her only get 11 at a time. She is aware that she cannot take a prescription and have it filled in a different state as well. She leaves Thursday morning at [facility] and does not return until Thursday of the next week. Her next prescription is due for pick up on Friday or Saturday.

## 2022-05-01 NOTE — Telephone Encounter (Signed)
Pt is traveling this week when it would be time for her next refill. She needs a written Rx for the temazepam that she can pick up by tomorrow so she can take it with her.

## 2022-05-02 ENCOUNTER — Telehealth: Payer: Self-pay

## 2022-05-02 NOTE — Telephone Encounter (Signed)
Let's try that.

## 2022-05-02 NOTE — Telephone Encounter (Signed)
I called and spoke with her pharmacist regarding why they are dispensing this medication every 11 days.  He has rerun the prescription and found that it will cover a 30-day supply.  I have submitted a verbal order to him over the phone to go ahead and fill the temazepam early with a 30-day supply.  Hopefully this will get her what she needs so she can travel.  I informed them they should get it ready for her as soon as possible so potentially, it will be ready for her to pick up today or tomorrow. ___________________________________________ Clearnce Sorrel, DNP, APRN, FNP-BC [facility] [place]

## 2022-05-02 NOTE — Telephone Encounter (Signed)
Pt aware the Rx was sent to the pharmacy.

## 2022-05-02 NOTE — Telephone Encounter (Signed)
Patient came into office concerning her medication refill on her temazepam to [place], I spoke with [place] and she stated that once Caryl Asp comes out of procedure she'd inform her medication refill, patient is waiting in lobby until [place] finishes procedure, please advise, thanks!

## 2022-05-11 ENCOUNTER — Other Ambulatory Visit: Payer: Self-pay

## 2022-05-11 MED ORDER — LEVOTHYROXINE SODIUM 137 MCG PO TABS: 137.0000 ug | ORAL_TABLET | Freq: Every day | ORAL | 0 refills | Status: DC

## 2022-05-11 NOTE — Telephone Encounter (Signed)
[name] [name] needs a refill on Levothyroxine. I did advise she is due for labs. She states she will try to come in for labs next week.   [name] [name] has never prescribed Levothyroxine. I did confirm the dose.

## 2022-06-07 DIAGNOSIS — C8112 Nodular sclerosis classical Hodgkin lymphoma, intrathoracic lymph nodes: Secondary | ICD-10-CM | POA: Diagnosis not present

## 2022-06-07 DIAGNOSIS — I10 Essential (primary) hypertension: Secondary | ICD-10-CM | POA: Diagnosis not present

## 2022-06-07 DIAGNOSIS — Z6835 Body mass index (BMI) 35.0-35.9, adult: Secondary | ICD-10-CM | POA: Diagnosis not present

## 2022-06-07 DIAGNOSIS — Z9189 Other specified personal risk factors, not elsewhere classified: Secondary | ICD-10-CM | POA: Diagnosis not present

## 2022-06-07 DIAGNOSIS — E039 Hypothyroidism, unspecified: Secondary | ICD-10-CM | POA: Diagnosis not present

## 2022-06-07 DIAGNOSIS — R7989 Other specified abnormal findings of blood chemistry: Secondary | ICD-10-CM | POA: Diagnosis not present

## 2022-06-11 ENCOUNTER — Other Ambulatory Visit: Payer: Self-pay | Admitting: Family Medicine

## 2022-06-13 ENCOUNTER — Telehealth: Payer: Self-pay

## 2022-06-13 MED ORDER — LEVOTHYROXINE SODIUM 125 MCG PO TABS: 137.0000 ug | ORAL_TABLET | Freq: Every day | ORAL | 1 refills | Status: DC

## 2022-06-13 NOTE — Telephone Encounter (Signed)
New prescription for levothyroxine 125 mcg sent to the pharmacy.  Lets have her go ahead and start taking this as soon as she gets it and then we can plan for recheck of TSH in about 6-8 weeks. ___________________________________________ Clearnce Sorrel, DNP, APRN, FNP-BC [facility] [place]

## 2022-06-13 NOTE — Telephone Encounter (Signed)
Patient received blood work from her cancer doctor, [place]. Her Thyroid is running on the low side. She stated that he normal range is .8 to 1 and now .005 are her levels now.   She wants to know if you need to see her sooner or do you want to reduce her dosage of Levothyroxine.

## 2022-06-14 NOTE — Telephone Encounter (Signed)
I spoke with the pt to give the message below. She has voiced understanding and has already picked up medication. She has a CPE scheduled for 07/11/2022 and does not want to miss much work. She will call back to push appointment out, so that she can get blood work done with this appointment.

## 2022-07-09 ENCOUNTER — Other Ambulatory Visit: Payer: Self-pay | Admitting: Medical-Surgical

## 2022-07-09 DIAGNOSIS — F5101 Primary insomnia: Secondary | ICD-10-CM

## 2022-07-11 ENCOUNTER — Encounter: Payer: BC Managed Care – PPO | Admitting: Medical-Surgical

## 2022-08-01 ENCOUNTER — Ambulatory Visit (INDEPENDENT_AMBULATORY_CARE_PROVIDER_SITE_OTHER): Payer: BC Managed Care – PPO | Admitting: Medical-Surgical

## 2022-08-01 ENCOUNTER — Encounter: Payer: Self-pay | Admitting: Medical-Surgical

## 2022-08-01 VITALS — BP 159/88 | HR 113 | Resp 18 | Ht 66.5 in | Wt 195.0 lb

## 2022-08-01 DIAGNOSIS — R5383 Other fatigue: Secondary | ICD-10-CM | POA: Diagnosis not present

## 2022-08-01 DIAGNOSIS — I10 Essential (primary) hypertension: Secondary | ICD-10-CM | POA: Diagnosis not present

## 2022-08-01 DIAGNOSIS — E039 Hypothyroidism, unspecified: Secondary | ICD-10-CM

## 2022-08-01 DIAGNOSIS — F5101 Primary insomnia: Secondary | ICD-10-CM | POA: Diagnosis not present

## 2022-08-01 DIAGNOSIS — M797 Fibromyalgia: Secondary | ICD-10-CM

## 2022-08-01 DIAGNOSIS — C8190 Hodgkin lymphoma, unspecified, unspecified site: Secondary | ICD-10-CM | POA: Diagnosis not present

## 2022-08-01 DIAGNOSIS — R7309 Other abnormal glucose: Secondary | ICD-10-CM | POA: Diagnosis not present

## 2022-08-01 MED ORDER — GABAPENTIN 300 MG PO CAPS: 300.0000 mg | ORAL_CAPSULE | Freq: Three times a day (TID) | ORAL | 1 refills | Status: DC

## 2022-08-01 MED ORDER — CYCLOBENZAPRINE HCL 10 MG PO TABS: 10.0000 mg | ORAL_TABLET | Freq: Every day | ORAL | 5 refills | Status: DC

## 2022-08-01 MED ORDER — TEMAZEPAM 15 MG PO CAPS: ORAL_CAPSULE | ORAL | 5 refills | Status: DC

## 2022-08-01 MED ORDER — MELOXICAM 15 MG PO TABS: ORAL_TABLET | ORAL | 3 refills | Status: DC

## 2022-08-01 MED ORDER — VALSARTAN 160 MG PO TABS: ORAL_TABLET | ORAL | 1 refills | Status: DC

## 2022-08-01 NOTE — Progress Notes (Signed)
Established Patient Office Visit  Subjective   Patient ID: [name] [name], female   DOB: [birth year] Age: 52 y.o. MRN: [id number]   Chief Complaint  Patient presents with   Follow-up   Thyroid Problem   HPI Pleasant 52 year old female presenting today for follow-up on thyroid issues.  Reports that she had her TSH checked by her cancer doctor at the end of December.  Her TSH at that time was less than 0.005.  She has been taking levothyroxine 125 mcg daily, tolerating the medication well without significant side effects.  Unfortunately she has developed profound fatigue/exhaustion.  She also has mental brain fog and exhaustion.  Denies skin/hair/nail changes, palpitations, worsened anxiety, and chest pain.  Reports that she is due for refills on most of her maintenance medications and would like to have this sent in today.  She is tolerating all of her prescribed medications well and feels that they are working to help control her symptoms.  She is not currently checking blood pressure at home although she does have a cuff available.   Objective:    Vitals:   08/01/22 1422 08/01/22 1423 08/01/22 1453  BP: (!) 149/82 (!) 149/82 (!) 159/88  Pulse: 93 93 (!) 113  Resp: 18    Height: 5' 6.5" (1.689 m)    Weight: 195 lb 0.6 oz (88.5 kg)    SpO2: 100% 100% 97%  BMI (Calculated): 31.01      Physical Exam Vitals reviewed.  Constitutional:      General: She is not in acute distress.    Appearance: Normal appearance. She is not ill-appearing.  HENT:     Head: Normocephalic and atraumatic.  Cardiovascular:     Rate and Rhythm: Normal rate and regular rhythm.     Pulses: Normal pulses.     Heart sounds: Normal heart sounds.  Pulmonary:     Effort: Pulmonary effort is normal. No respiratory distress.     Breath sounds: Normal breath sounds. No wheezing, rhonchi or rales.  Skin:    General: Skin is warm and dry.  Neurological:     Mental Status: She is alert and oriented to person,  place, and time.  Psychiatric:        Mood and Affect: Mood normal.        Behavior: Behavior normal.        Thought Content: Thought content normal.        Judgment: Judgment normal.   No results found for this or any previous visit (from the past 24 hour(s)).     The ASCVD Risk score (Arnett DK, et al., 2019) failed to calculate for the following reasons:   Cannot find a previous HDL lab   Assessment & Plan:   1. Fatigue, unspecified type Unclear etiology.  Consider medication side effects versus thyroid dysfunction versus fibromyalgia flare.  Checking labs as below. - CBC with Differential/Platelet - COMPLETE METABOLIC PANEL WITH GFR - Hemoglobin A1c - VITAMIN D 25 Hydroxy (Vit-D Deficiency, Fractures) - Vitamin B12 - Iron, TIBC and Ferritin Panel  2. Acquired hypothyroidism Rechecking TSH and free T4 for further evaluation.  Plan to continue levothyroxine as ordered until results are available and then we will titrate as needed. - TSH - T4, free  3. Primary insomnia Currently doing well on temazepam 15 mg nightly for insomnia.  Refilling today. - temazepam (RESTORIL) 15 MG capsule; TAKE 1 CAPSULE(15 MG) BY MOUTH AT BEDTIME  Dispense: 30 capsule; Refill: 5  4. Fibromyalgia Continue  gabapentin 300 mg 3 times daily.  Also okay to use Flexeril 10 mg nightly as needed.  Discussed other options for treatment of fibromyalgia including Cymbalta, Effexor, Pristiq, Lyrica, and amitriptyline.  She is very hesitant to start a mood altering medication and would like to avoid this if possible. - gabapentin (NEURONTIN) 300 MG capsule; Take 1 capsule (300 mg total) by mouth 3 (three) times daily.  Dispense: 270 capsule; Refill: 1 - cyclobenzaprine (FLEXERIL) 10 MG tablet; Take 1 tablet (10 mg total) by mouth at bedtime.  Dispense: 90 tablet; Refill: 5  5. Primary hypertension Blood pressure is elevated on arrival and recheck was higher.  Not currently checking her blood pressure at home  so recommend that she restart doing this at least 3 times weekly.  If consistently higher than 130/80 or less, we will need to make changes to her medication dose.  Continue valsartan 160 mg daily.  Recommend low-sodium diet.  Work to increase intentional physical activity to at least 3 times weekly.  Return if symptoms worsen or fail to improve.  ___________________________________________ Clearnce Sorrel, DNP, APRN, FNP-BC [facility] [place]

## 2022-08-02 LAB — COMPLETE METABOLIC PANEL WITH GFR
AG Ratio: 1.5 (calc) (ref 1.0–2.5)
ALT: 18 U/L (ref 6–29)
AST: 15 U/L (ref 10–35)
Albumin: 4.5 g/dL (ref 3.6–5.1)
Alkaline phosphatase (APISO): 90 U/L (ref 37–153)
BUN: 23 mg/dL (ref 7–25)
CO2: 29 mmol/L (ref 20–32)
Calcium: 10.2 mg/dL (ref 8.6–10.4)
Chloride: 102 mmol/L (ref 98–110)
Creat: 0.68 mg/dL (ref 0.50–1.03)
Globulin: 3 g/dL (calc) (ref 1.9–3.7)
Glucose, Bld: 93 mg/dL (ref 65–99)
Potassium: 4.2 mmol/L (ref 3.5–5.3)
Sodium: 141 mmol/L (ref 135–146)
Total Bilirubin: 0.4 mg/dL (ref 0.2–1.2)
Total Protein: 7.5 g/dL (ref 6.1–8.1)
eGFR: 104 mL/min/{1.73_m2} (ref >=60)

## 2022-08-02 LAB — CBC WITH DIFFERENTIAL/PLATELET
Absolute Monocytes: 320 cells/uL (ref 200–950)
Basophils Absolute: 20 cells/uL (ref 0–200)
Basophils Relative: 0.4 %
Eosinophils Absolute: 80 cells/uL (ref 15–500)
Eosinophils Relative: 1.6 %
HCT: 38 % (ref 35.0–45.0)
Hemoglobin: 13.3 g/dL (ref 11.7–15.5)
Lymphs Abs: 1400 cells/uL (ref 850–3900)
MCH: 31.1 pg (ref 27.0–33.0)
MCHC: 35 g/dL (ref 32.0–36.0)
MCV: 88.8 fL (ref 80.0–100.0)
MPV: 9.3 fL (ref 7.5–12.5)
Monocytes Relative: 6.4 %
Neutro Abs: 3180 cells/uL (ref 1500–7800)
Neutrophils Relative %: 63.6 %
Platelets: 229 10*3/uL (ref 140–400)
RBC: 4.28 10*6/uL (ref 3.80–5.10)
RDW: 12.8 % (ref 11.0–15.0)
Total Lymphocyte: 28 %
WBC: 5 10*3/uL (ref 3.8–10.8)

## 2022-08-02 LAB — TSH: TSH: 0.02 mIU/L — ABNORMAL LOW

## 2022-08-02 LAB — IRON,TIBC AND FERRITIN PANEL
%SAT: 24 % (calc) (ref 16–45)
Ferritin: 191 ng/mL (ref 16–232)
Iron: 70 ug/dL (ref 45–160)
TIBC: 290 mcg/dL (calc) (ref 250–450)

## 2022-08-02 LAB — HEMOGLOBIN A1C
Hgb A1c MFr Bld: 5.2 % of total Hgb (ref <5.7)
Mean Plasma Glucose: 103 mg/dL
eAG (mmol/L): 5.7 mmol/L

## 2022-08-02 LAB — T4, FREE: Free T4: 1.4 ng/dL (ref 0.8–1.8)

## 2022-08-02 LAB — VITAMIN B12: Vitamin B-12: 1056 pg/mL (ref 200–1100)

## 2022-08-02 LAB — VITAMIN D 25 HYDROXY (VIT D DEFICIENCY, FRACTURES): Vit D, 25-Hydroxy: 33 ng/mL (ref 30–100)

## 2022-08-02 MED ORDER — LEVOTHYROXINE SODIUM 112 MCG PO TABS: 112.0000 ug | ORAL_TABLET | Freq: Every day | ORAL | 0 refills | Status: DC

## 2022-08-02 NOTE — Addendum Note (Signed)
Addended bySamuel Bouche on: 08/02/2022 07:30 AM   Modules accepted: Orders

## 2022-08-30 ENCOUNTER — Encounter: Payer: Self-pay | Admitting: Medical-Surgical

## 2022-09-12 ENCOUNTER — Telehealth: Payer: Self-pay | Admitting: Medical-Surgical

## 2022-09-12 DIAGNOSIS — F5101 Primary insomnia: Secondary | ICD-10-CM

## 2022-09-12 DIAGNOSIS — E039 Hypothyroidism, unspecified: Secondary | ICD-10-CM | POA: Diagnosis not present

## 2022-09-12 LAB — TSH: TSH: 0.49 mIU/L

## 2022-09-12 NOTE — Telephone Encounter (Signed)
Pt called requesting a refill of temazepam (RESTORIL) 15 MG capsule [id number] . Pt stated she will be out of town starting 09/14/2022 and would like to pick up the medication before she leaves.

## 2022-09-12 NOTE — Telephone Encounter (Signed)
Sent My-Chart message

## 2022-09-13 ENCOUNTER — Telehealth: Payer: Self-pay | Admitting: Medical-Surgical

## 2022-09-13 NOTE — Telephone Encounter (Signed)
Pt called stating she is going out of town the morning of 09/14/2022. Pt is trying to pick up her refill of temazepam (RESTORIL) 15 MG capsule [id number] but the pharmacy needs approval for her to pick them up a few days early.

## 2022-09-14 NOTE — Telephone Encounter (Signed)
Pharmacy was called and the patient was notified via [place].

## 2022-10-18 ENCOUNTER — Other Ambulatory Visit: Payer: Self-pay

## 2022-10-18 DIAGNOSIS — E039 Hypothyroidism, unspecified: Secondary | ICD-10-CM

## 2022-10-18 MED ORDER — LEVOTHYROXINE SODIUM 112 MCG PO TABS: 112.0000 ug | ORAL_TABLET | Freq: Every day | ORAL | 0 refills | Status: DC

## 2022-10-30 ENCOUNTER — Other Ambulatory Visit: Payer: Self-pay | Admitting: Medical-Surgical

## 2022-10-30 DIAGNOSIS — E039 Hypothyroidism, unspecified: Secondary | ICD-10-CM

## 2022-12-29 ENCOUNTER — Encounter: Payer: Self-pay | Admitting: Medical-Surgical

## 2022-12-29 DIAGNOSIS — M797 Fibromyalgia: Secondary | ICD-10-CM

## 2022-12-29 MED ORDER — VALSARTAN 160 MG PO TABS: ORAL_TABLET | ORAL | 1 refills | Status: DC

## 2022-12-29 MED ORDER — GABAPENTIN 300 MG PO CAPS: 300.0000 mg | ORAL_CAPSULE | Freq: Three times a day (TID) | ORAL | 1 refills | Status: DC

## 2023-02-16 ENCOUNTER — Encounter: Payer: Self-pay | Admitting: Medical-Surgical

## 2023-02-16 DIAGNOSIS — F5101 Primary insomnia: Secondary | ICD-10-CM

## 2023-02-16 MED ORDER — TEMAZEPAM 15 MG PO CAPS: ORAL_CAPSULE | ORAL | 0 refills | Status: DC

## 2023-02-16 NOTE — Telephone Encounter (Signed)
Patient is due to follow up with me for chronic disease management. Refill for 30 days of Temazepam sent. Please schedule patient for an in person follow up to prevent further delays in refills.

## 2023-02-16 NOTE — Telephone Encounter (Signed)
Requesting rx rf of temazepam.  Last written 08/01/2022 Last OV 08/01/2022 No upcoming appt schld.

## 2023-02-19 ENCOUNTER — Other Ambulatory Visit: Payer: Self-pay

## 2023-02-19 DIAGNOSIS — E039 Hypothyroidism, unspecified: Secondary | ICD-10-CM

## 2023-02-19 NOTE — Telephone Encounter (Signed)
Spoke with pharmacy - script Is ready for pick up. Patient informed. Patient scheduled for follow up appt .

## 2023-02-19 NOTE — Telephone Encounter (Signed)
Attempted call to patient. Left a voice mail message requesting a return call.  

## 2023-03-06 ENCOUNTER — Encounter: Payer: Self-pay | Admitting: Medical-Surgical

## 2023-03-06 ENCOUNTER — Ambulatory Visit: Payer: BC Managed Care – PPO | Admitting: Medical-Surgical

## 2023-03-06 VITALS — BP 126/80 | HR 92 | Resp 20 | Ht 66.5 in | Wt 197.8 lb

## 2023-03-06 DIAGNOSIS — M797 Fibromyalgia: Secondary | ICD-10-CM | POA: Diagnosis not present

## 2023-03-06 DIAGNOSIS — E039 Hypothyroidism, unspecified: Secondary | ICD-10-CM

## 2023-03-06 DIAGNOSIS — G43709 Chronic migraine without aura, not intractable, without status migrainosus: Secondary | ICD-10-CM

## 2023-03-06 DIAGNOSIS — F5101 Primary insomnia: Secondary | ICD-10-CM

## 2023-03-06 DIAGNOSIS — F3341 Major depressive disorder, recurrent, in partial remission: Secondary | ICD-10-CM

## 2023-03-06 DIAGNOSIS — I10 Essential (primary) hypertension: Secondary | ICD-10-CM | POA: Diagnosis not present

## 2023-03-06 LAB — TSH: TSH: 0.674 u[IU]/mL (ref 0.450–4.500)

## 2023-03-06 MED ORDER — MONTELUKAST SODIUM 10 MG PO TABS: 10.0000 mg | ORAL_TABLET | Freq: Every day | ORAL | 3 refills | Status: DC

## 2023-03-06 MED ORDER — TEMAZEPAM 15 MG PO CAPS
ORAL_CAPSULE | ORAL | 1 refills | Status: DC
Start: 2023-03-06 — End: 2023-09-06

## 2023-03-06 MED ORDER — RIZATRIPTAN BENZOATE 10 MG PO TBDP: 10.0000 mg | ORAL_TABLET | ORAL | 3 refills | Status: DC | PRN

## 2023-03-06 MED ORDER — VALSARTAN 160 MG PO TABS: ORAL_TABLET | ORAL | 1 refills | Status: DC

## 2023-03-06 MED ORDER — GABAPENTIN 300 MG PO CAPS
300.0000 mg | ORAL_CAPSULE | Freq: Three times a day (TID) | ORAL | 1 refills | Status: DC
Start: 2023-03-06 — End: 2023-09-06

## 2023-03-06 MED ORDER — LEVOTHYROXINE SODIUM 112 MCG PO TABS
112.0000 ug | ORAL_TABLET | Freq: Every day | ORAL | 0 refills | Status: DC
Start: 2023-03-06 — End: 2023-07-23

## 2023-03-06 MED ORDER — CYCLOBENZAPRINE HCL 10 MG PO TABS: 10.0000 mg | ORAL_TABLET | Freq: Every day | ORAL | 5 refills | Status: DC

## 2023-03-06 NOTE — Progress Notes (Signed)
        Established patient visit  History, exam, impression, and plan:  1. Primary hypertension Very pleasant 52 year old female presenting today for follow-up on hypertension.  She is currently taking valsartan 160 mg daily, tolerating well without side effects.  Reports that the medication is working well for her and she has had no concerns with elevated blood pressures.  Following a low-sodium diet and staying active as tolerated.  Denies concerning symptoms.  Cardiopulmonary exam normal.  Blood pressure at goal.  Continue valsartan 160 mg daily as prescribed.  2. Fibromyalgia History of fibromyalgia currently well-managed with gabapentin 300 mg 3 times daily as well as as needed use of Flexeril at bedtime.  Feels that the medications are working well and these are well-tolerated without side effects.  No current complaints of fibromyalgia flares.  Continue gabapentin and Flexeril as prescribed. - cyclobenzaprine (FLEXERIL) 10 MG tablet; Take 1 tablet (10 mg total) by mouth at bedtime.  Dispense: 90 tablet; Refill: 5 - gabapentin (NEURONTIN) 300 MG capsule; Take 1 capsule (300 mg total) by mouth 3 (three) times daily.  Dispense: 270 capsule; Refill: 1  3. Acquired hypothyroidism History of acquired hypothyroidism.  Currently taking levothyroxine 112 mcg daily.  Recent labs showing thyroid function is stable.  No concerning symptoms.  Continue levothyroxine as prescribed.  Plan for recheck of labs in 3-6 months. - levothyroxine (SYNTHROID) 112 MCG tablet; Take 1 tablet (112 mcg total) by mouth daily.  Dispense: 90 tablet; Refill: 0  4. Primary insomnia Doing well on temazepam 15 mg daily.  Has been on this for a while and feels that it is very helpful.  No side effects or excessive grogginess noted.  Continue temazepam as prescribed. - temazepam (RESTORIL) 15 MG capsule; TAKE 1 CAPSULE(15 MG) BY MOUTH AT BEDTIME. Due for follow up appt, please call and schedule. Thanks!  Dispense: 90 capsule;  Refill: 1  5. Chronic migraine without aura without status migrainosus, not intractable Uses Maxalt 10 mg daily as needed for migraine abortion.  Notes that these are infrequent however she would like a refill of her medication.  Sending in Maxalt today.  6. Recurrent major depressive disorder, in partial remission (HCC) Not currently on any mood altering medications and reports that she is doing well overall.  Denies concern for depression at this time.  Denies SI/HI.  Mood, affect, thought pattern, speech, and cognition all normal during appointment.  No further interventions necessary.   Procedures performed this visit: None.  Return in about 6 months (around 09/03/2023) for chronic disease follow up.  __________________________________ Thayer Ohm, DNP, APRN, FNP-BC Primary Care and Sports Medicine [facility] [place]

## 2023-03-16 ENCOUNTER — Other Ambulatory Visit: Payer: Self-pay | Admitting: Medical-Surgical

## 2023-03-16 DIAGNOSIS — Z1212 Encounter for screening for malignant neoplasm of rectum: Secondary | ICD-10-CM

## 2023-03-16 DIAGNOSIS — Z1211 Encounter for screening for malignant neoplasm of colon: Secondary | ICD-10-CM

## 2023-03-20 ENCOUNTER — Other Ambulatory Visit: Payer: Self-pay | Admitting: Medical-Surgical

## 2023-03-20 DIAGNOSIS — F5101 Primary insomnia: Secondary | ICD-10-CM

## 2023-03-22 NOTE — Telephone Encounter (Signed)
Attempted call to patient. Left a voice mail message requesting a return call.  

## 2023-03-22 NOTE — Telephone Encounter (Signed)
Medications sent on 03/06/2023 with receipt confirmed at the pharmacy.  See below.  temazepam (RESTORIL) 15 MG capsule90 capsule19/24/2024--Sig: TAKE 1 CAPSULE(15 MG) BY MOUTH AT BEDTIME. Due for follow up appt, please call and schedule. Thanks!Sent to pharmacy as: temazepam (RESTORIL) 15 MG capsuleE-Prescribing Status: Receipt confirmed by pharmacy (03/06/2023  4:01 PM EDT)

## 2023-03-29 NOTE — Telephone Encounter (Signed)
Patient informed. 

## 2023-04-16 ENCOUNTER — Other Ambulatory Visit: Payer: Self-pay | Admitting: Medical-Surgical

## 2023-06-20 ENCOUNTER — Other Ambulatory Visit: Payer: Self-pay | Admitting: Medical-Surgical

## 2023-07-06 ENCOUNTER — Other Ambulatory Visit: Payer: Self-pay | Admitting: Medical-Surgical

## 2023-07-21 ENCOUNTER — Other Ambulatory Visit: Payer: Self-pay | Admitting: Medical-Surgical

## 2023-07-21 DIAGNOSIS — E039 Hypothyroidism, unspecified: Secondary | ICD-10-CM

## 2023-08-27 ENCOUNTER — Other Ambulatory Visit: Payer: Self-pay | Admitting: Medical-Surgical

## 2023-08-31 ENCOUNTER — Other Ambulatory Visit: Payer: Self-pay | Admitting: Medical-Surgical

## 2023-08-31 DIAGNOSIS — M797 Fibromyalgia: Secondary | ICD-10-CM

## 2023-09-03 ENCOUNTER — Ambulatory Visit: Payer: BC Managed Care – PPO | Admitting: Medical-Surgical

## 2023-09-06 ENCOUNTER — Ambulatory Visit: Admitting: Medical-Surgical

## 2023-09-06 ENCOUNTER — Encounter: Payer: Self-pay | Admitting: Medical-Surgical

## 2023-09-06 VITALS — BP 145/84 | HR 82 | Resp 20 | Ht 66.5 in | Wt 206.7 lb

## 2023-09-06 DIAGNOSIS — Z23 Encounter for immunization: Secondary | ICD-10-CM

## 2023-09-06 DIAGNOSIS — F5101 Primary insomnia: Secondary | ICD-10-CM

## 2023-09-06 DIAGNOSIS — F3341 Major depressive disorder, recurrent, in partial remission: Secondary | ICD-10-CM | POA: Diagnosis not present

## 2023-09-06 DIAGNOSIS — G43009 Migraine without aura, not intractable, without status migrainosus: Secondary | ICD-10-CM

## 2023-09-06 DIAGNOSIS — F430 Acute stress reaction: Secondary | ICD-10-CM

## 2023-09-06 DIAGNOSIS — E039 Hypothyroidism, unspecified: Secondary | ICD-10-CM

## 2023-09-06 DIAGNOSIS — Z9109 Other allergy status, other than to drugs and biological substances: Secondary | ICD-10-CM

## 2023-09-06 DIAGNOSIS — I10 Essential (primary) hypertension: Secondary | ICD-10-CM | POA: Diagnosis not present

## 2023-09-06 DIAGNOSIS — M797 Fibromyalgia: Secondary | ICD-10-CM

## 2023-09-06 MED ORDER — VALSARTAN 320 MG PO TABS
ORAL_TABLET | ORAL | 3 refills | Status: DC
Start: 2023-09-06 — End: 2024-01-03

## 2023-09-06 MED ORDER — TEMAZEPAM 15 MG PO CAPS: ORAL_CAPSULE | ORAL | 1 refills | Status: DC

## 2023-09-06 MED ORDER — MONTELUKAST SODIUM 10 MG PO TABS: 10.0000 mg | ORAL_TABLET | Freq: Every day | ORAL | 3 refills | Status: DC

## 2023-09-06 MED ORDER — HYDROXYZINE HCL 10 MG PO TABS: 10.0000 mg | ORAL_TABLET | Freq: Three times a day (TID) | ORAL | 0 refills | Status: DC | PRN

## 2023-09-06 MED ORDER — GABAPENTIN 300 MG PO CAPS
300.0000 mg | ORAL_CAPSULE | Freq: Three times a day (TID) | ORAL | 3 refills | Status: DC
Start: 2023-09-06 — End: 2024-01-03

## 2023-09-06 MED ORDER — FLUOXETINE HCL 20 MG PO CAPS: 20.0000 mg | ORAL_CAPSULE | Freq: Every day | ORAL | 3 refills | Status: AC

## 2023-09-06 MED ORDER — CYCLOBENZAPRINE HCL 10 MG PO TABS: 10.0000 mg | ORAL_TABLET | Freq: Every day | ORAL | 3 refills | Status: DC

## 2023-09-06 MED ORDER — RIZATRIPTAN BENZOATE 10 MG PO TBDP
10.0000 mg | ORAL_TABLET | ORAL | 3 refills | Status: DC | PRN
Start: 2023-09-06 — End: 2024-01-03

## 2023-09-06 MED ORDER — FLUOXETINE HCL 10 MG PO CAPS: 10.0000 mg | ORAL_CAPSULE | Freq: Every day | ORAL | 0 refills | Status: DC

## 2023-09-06 NOTE — Progress Notes (Signed)
 Established patient visit  History, exam, impression, and plan:  1. Primary insomnia Pleasant 53 year old female presenting today with a long history of primary insomnia.  She has been treated long-term with temazepam 15 mg nightly as needed for sleep.  She usually takes this every night and medication works well for her.  There are times when she still has difficulty sleeping but these are the exception rather that they will.  Plan to continue temazepam as prescribed. - temazepam (RESTORIL) 15 MG capsule; TAKE 1 CAPSULE(15 MG) BY MOUTH AT BEDTIME.  Dispense: 90 capsule; Refill: 1  2. Acquired hypothyroidism (Primary) Taking levothyroxine 112 mcg daily, tolerating well without side effects.  Has been having some intermittent palpitations that she associates with stress.  No other concerning symptoms.  Plan check TSH today with possible adjustment of levothyroxine dose depending on results. - TSH  3. Primary hypertension Currently taking losartan 160 mg daily.  Reports that she has been having a lots of job stress and her blood pressure has been quite variable.  There are days that the diastolic is in the 90s-100s and the systolic gets high as well.  Over the last 4 weeks, she has been having a throbbing headache over the back of her neck and into her head on waking.  Feels that this is related to her elevated blood pressure.  Plan to check labs as below.  Blood pressure elevated on arrival and again on recheck.  Increasing valsartan to 320 mg daily.  If this is not helpful for her symptoms, consider adding a small dose of amlodipine versus a diuretic. - CBC - CMP14+EGFR - Lipid panel - valsartan (DIOVAN) 320 MG tablet; TAKE 1 TABLET(160 MG) BY MOUTH DAILY  Dispense: 90 tablet; Refill: 3  4. Recurrent major depressive disorder, in partial remission (HCC) Notes that she is dealing with a lot of stress from work but is otherwise feeling good emotionally.  She is not currently taking  anything for mood management but is interested in getting symptoms started to help manage her overall mental health.  Discussed various options for management.  Starting fluoxetine 10 mg daily x 7 days and if doing well, increase to 20 mg daily thereafter.  Will follow closely with her to make sure that she is tolerating medication and then this working as well as it should.  Discussed option for counseling however she would like to hold off for now. - VITAMIN D 25 Hydroxy (Vit-D Deficiency, Fractures)  5. Fibromyalgia She does have a history of fibromyalgia and during flares, she will take gabapentin 300 mcg on an as-needed basis along with Flexeril 10 mg nightly.  Feels this is working well for her.  No change in regimen.  Continue Flexeril and gabapentin as prescribed. - cyclobenzaprine (FLEXERIL) 10 MG tablet; Take 1 tablet (10 mg total) by mouth at bedtime.  Dispense: 90 tablet; Refill: 3 - gabapentin (NEURONTIN) 300 MG capsule; Take 1 capsule (300 mg total) by mouth 3 (three) times daily.  Dispense: 270 capsule; Refill: 3  6. Stress reaction As noted above, adding fluoxetine for management of anxiety, depression, and stress.  Adding hydroxyzine 10 mg 3 times daily as needed for breakthrough anxiety and sleep. - hydrOXYzine (ATARAX) 10 MG tablet; Take 1 tablet (10 mg total) by mouth 3 (three) times daily as needed.  Dispense: 90 tablet; Refill: 0 - FLUoxetine (PROZAC) 10 MG capsule; Take 1 capsule (10 mg total) by mouth daily.  Dispense: 7 capsule;  Refill: 0 - FLUoxetine (PROZAC) 20 MG capsule; Take 1 capsule (20 mg total) by mouth daily.  Dispense: 90 capsule; Refill: 3  7. Environmental allergies Continues to have struggles with environmental allergies.  Has tried over-the-counter antihistamines but does not feel they work.  Adding Singulair 10 mg nightly. - montelukast (SINGULAIR) 10 MG tablet; Take 1 tablet (10 mg total) by mouth at bedtime.  Dispense: 90 tablet; Refill: 3  8. Migraine  without aura and without status migrainosus, not intractable Has been doing well overall.  Stress seems to make her headaches worse however the Maxalt works very well to help with migraine abortion.  Refilling today. - rizatriptan (MAXALT-MLT) 10 MG disintegrating tablet; Take 1 tablet (10 mg total) by mouth as needed for migraine. May repeat in 2 hours if needed  Dispense: 10 tablet; Refill: 3   Procedures performed this visit: None.  Return in about 2 weeks (around 09/20/2023) for nurse visit for BP check; mood follow up in 4-6 weeks.  __________________________________ Thayer Ohm, DNP, APRN, FNP-BC Primary Care and Sports Medicine [facility]

## 2023-09-07 ENCOUNTER — Telehealth: Payer: Self-pay

## 2023-09-07 ENCOUNTER — Other Ambulatory Visit (HOSPITAL_COMMUNITY): Payer: Self-pay

## 2023-09-07 ENCOUNTER — Telehealth: Payer: Self-pay | Admitting: Pharmacy Technician

## 2023-09-07 ENCOUNTER — Other Ambulatory Visit: Payer: Self-pay | Admitting: Medical-Surgical

## 2023-09-07 ENCOUNTER — Encounter: Payer: Self-pay | Admitting: Medical-Surgical

## 2023-09-07 DIAGNOSIS — G43009 Migraine without aura, not intractable, without status migrainosus: Secondary | ICD-10-CM

## 2023-09-07 LAB — CMP14+EGFR
ALT: 16 IU/L (ref 0–32)
AST: 16 IU/L (ref 0–40)
Albumin: 4.6 g/dL (ref 3.8–4.9)
Alkaline Phosphatase: 90 IU/L (ref 44–121)
BUN/Creatinine Ratio: 22 (ref 9–23)
BUN: 15 mg/dL (ref 6–24)
Bilirubin Total: 0.3 mg/dL (ref 0.0–1.2)
CO2: 24 mmol/L (ref 20–29)
Calcium: 9.9 mg/dL (ref 8.7–10.2)
Chloride: 102 mmol/L (ref 96–106)
Creatinine, Ser: 0.67 mg/dL (ref 0.57–1.00)
Globulin, Total: 2.3 g/dL (ref 1.5–4.5)
Glucose: 96 mg/dL (ref 70–99)
Potassium: 4.6 mmol/L (ref 3.5–5.2)
Sodium: 140 mmol/L (ref 134–144)
Total Protein: 6.9 g/dL (ref 6.0–8.5)
eGFR: 104 mL/min/{1.73_m2} (ref >59)

## 2023-09-07 LAB — CBC
Hematocrit: 40.6 % (ref 34.0–46.6)
Hemoglobin: 13.4 g/dL (ref 11.1–15.9)
MCH: 30.5 pg (ref 26.6–33.0)
MCHC: 33 g/dL (ref 31.5–35.7)
MCV: 93 fL (ref 79–97)
Platelets: 269 10*3/uL (ref 150–450)
RBC: 4.39 x10E6/uL (ref 3.77–5.28)
RDW: 12.7 % (ref 11.7–15.4)
WBC: 5.8 10*3/uL (ref 3.4–10.8)

## 2023-09-07 LAB — TSH: TSH: 0.866 u[IU]/mL (ref 0.450–4.500)

## 2023-09-07 LAB — LIPID PANEL
Chol/HDL Ratio: 3.4 ratio (ref 0.0–4.4)
Cholesterol, Total: 186 mg/dL (ref 100–199)
HDL: 54 mg/dL (ref >39)
LDL Chol Calc (NIH): 91 mg/dL (ref 0–99)
Triglycerides: 245 mg/dL — ABNORMAL HIGH (ref 0–149)
VLDL Cholesterol Cal: 41 mg/dL — ABNORMAL HIGH (ref 5–40)

## 2023-09-07 LAB — VITAMIN D 25 HYDROXY (VIT D DEFICIENCY, FRACTURES): Vit D, 25-Hydroxy: 29.1 ng/mL — ABNORMAL LOW (ref 30.0–100.0)

## 2023-09-07 NOTE — Telephone Encounter (Signed)
 Pharmacy Patient Advocate Encounter   Received notification from CoverMyMeds that prior authorization for Cyclobenzaprine HCl 10MG  tablets is required/requested.   Insurance verification completed.   The patient is insured through [place]    .   Per test claim: MAX DAYS THERAPY ALLOWED IS 21 DAYS.  CALLED PHARMACY AND PATIENT PAYS CASH FOR THE RX #90/90 DAYS FOR $18.41.

## 2023-09-08 ENCOUNTER — Encounter: Payer: Self-pay | Admitting: Medical-Surgical

## 2023-09-09 ENCOUNTER — Other Ambulatory Visit: Payer: Self-pay | Admitting: Medical-Surgical

## 2023-09-09 DIAGNOSIS — F5101 Primary insomnia: Secondary | ICD-10-CM

## 2023-09-11 NOTE — Telephone Encounter (Signed)
error 

## 2023-09-18 ENCOUNTER — Other Ambulatory Visit (HOSPITAL_COMMUNITY): Payer: Self-pay

## 2023-09-18 ENCOUNTER — Telehealth: Payer: Self-pay

## 2023-09-18 NOTE — Telephone Encounter (Signed)
 Pharmacy Patient Advocate Encounter  Received notification from [facility] that Prior Authorization for Temazepam 15 is not needed. Per test claim , a 30 day supply has a patient co-pay of $4.19.

## 2023-09-20 ENCOUNTER — Ambulatory Visit

## 2023-10-04 ENCOUNTER — Encounter: Payer: Self-pay | Admitting: Medical-Surgical

## 2023-10-04 ENCOUNTER — Ambulatory Visit: Admitting: Medical-Surgical

## 2023-10-04 VITALS — BP 152/79 | HR 73 | Resp 20 | Ht 66.5 in | Wt 206.5 lb

## 2023-10-04 DIAGNOSIS — F3341 Major depressive disorder, recurrent, in partial remission: Secondary | ICD-10-CM

## 2023-10-04 DIAGNOSIS — I10 Essential (primary) hypertension: Secondary | ICD-10-CM | POA: Diagnosis not present

## 2023-10-04 MED ORDER — HYDROCHLOROTHIAZIDE 12.5 MG PO TABS: 12.5000 mg | ORAL_TABLET | Freq: Every day | ORAL | 3 refills | Status: DC

## 2023-10-04 NOTE — Progress Notes (Signed)
        Established patient visit  History, exam, impression, and plan:  1. Primary hypertension (Primary) Very pleasant 52 year old female presenting today for follow-up on hypertension.  At our last visit, we switched her from losartan to valsartan  320 mg daily.  She has been taking the medication as prescribed, no missed doses, no side effects.  Monitoring blood pressure at home and reports readings such as 141/73, 155/88, 138/83, 138/77.  Denies any concerning symptoms at this time.  Following a low-sodium diet and has started exercising more.  Cardiopulmonary exam is normal today.  Blood pressure still elevated outside of recommended goals.  Continue valsartan  320 mg daily.  Adding HCTZ 12.5 mg daily.  Requested that she bring her machine with her at her 2-week nurse visit to validate for accuracy.  Continue checking at home with a goal of 130/80 or less.  2. Recurrent major depressive disorder, in partial remission (HCC) Has been taking fluoxetine  20 mg daily, tolerating well without side effects.  Has noticed a huge difference since starting the medication and feels that she has more energy, less depression, less anxiety, and handles stress better.  Feels that this is a good dose for her and is happy with the result.  Denies SI/HI.  Mood, affect, speech, thought pattern, and cognition all normal during the appointment.  Continue fluoxetine  20 mg daily.   Procedures performed this visit: None.  Return in about 2 weeks (around 10/18/2023) for nurse visit for BP check.  __________________________________ Maryl Snook, DNP, APRN, FNP-BC Primary Care and Sports Medicine [facility] [place]

## 2023-11-26 ENCOUNTER — Other Ambulatory Visit: Payer: Self-pay | Admitting: Medical-Surgical

## 2023-11-26 DIAGNOSIS — F430 Acute stress reaction: Secondary | ICD-10-CM

## 2023-11-26 DIAGNOSIS — E039 Hypothyroidism, unspecified: Secondary | ICD-10-CM

## 2024-01-03 ENCOUNTER — Encounter: Payer: Self-pay | Admitting: Medical-Surgical

## 2024-01-03 ENCOUNTER — Ambulatory Visit: Admitting: Medical-Surgical

## 2024-01-03 VITALS — BP 138/83 | HR 91 | Resp 20 | Ht 66.5 in | Wt 207.1 lb

## 2024-01-03 DIAGNOSIS — G43009 Migraine without aura, not intractable, without status migrainosus: Secondary | ICD-10-CM

## 2024-01-03 DIAGNOSIS — I10 Essential (primary) hypertension: Secondary | ICD-10-CM

## 2024-01-03 DIAGNOSIS — Z9109 Other allergy status, other than to drugs and biological substances: Secondary | ICD-10-CM

## 2024-01-03 DIAGNOSIS — E039 Hypothyroidism, unspecified: Secondary | ICD-10-CM

## 2024-01-03 DIAGNOSIS — F5101 Primary insomnia: Secondary | ICD-10-CM | POA: Diagnosis not present

## 2024-01-03 DIAGNOSIS — M797 Fibromyalgia: Secondary | ICD-10-CM

## 2024-01-03 MED ORDER — TEMAZEPAM 15 MG PO CAPS: ORAL_CAPSULE | ORAL | 3 refills | Status: AC

## 2024-01-03 MED ORDER — CYCLOBENZAPRINE HCL 10 MG PO TABS: 10.0000 mg | ORAL_TABLET | Freq: Every day | ORAL | 3 refills | Status: DC

## 2024-01-03 MED ORDER — VALSARTAN 320 MG PO TABS: ORAL_TABLET | ORAL | 3 refills | Status: AC

## 2024-01-03 MED ORDER — GABAPENTIN 300 MG PO CAPS: 300.0000 mg | ORAL_CAPSULE | Freq: Three times a day (TID) | ORAL | 3 refills | Status: AC

## 2024-01-03 MED ORDER — MELOXICAM 15 MG PO TABS: 15.0000 mg | ORAL_TABLET | Freq: Every day | ORAL | 3 refills | Status: AC

## 2024-01-03 MED ORDER — RIZATRIPTAN BENZOATE 10 MG PO TBDP: 10.0000 mg | ORAL_TABLET | ORAL | 3 refills | Status: AC | PRN

## 2024-01-03 MED ORDER — MONTELUKAST SODIUM 10 MG PO TABS: 10.0000 mg | ORAL_TABLET | Freq: Every day | ORAL | 3 refills | Status: AC

## 2024-01-03 NOTE — Progress Notes (Signed)
        Established patient visit  Discussed the use of AI scribe software for clinical note transcription with the patient, who gave verbal consent to proceed.  History of Present Illness   [name] [name] is a 53 year old female with hypertension and hypothyroidism who presents for a thyroid  follow-up and blood pressure management.  Hypertension - Unable to refill prescribed 320 mg antihypertensive medication due to loss of insurance - Currently taking a previous prescription of 160 mg - Home blood pressure readings have remained elevated  Hypothyroidism - Takes levothyroxine  112mcg daily - Needs refill sent  Fibromyalgia - Frequently uses Advil after stopping Meloxicam  - Fibromyalgia symptoms have worsened - Requesting refill of Meloxicam  as it was effective  Anxiety/Depression - No longer taking Hydroxyzine  - Doing well on Fluoxetine  20mg  daily - Denies SI/HI  Insomnia - Taking temazepam  15 mg for sleep, which is well tolerated and effective  Migraine - Uses Maxalt  as needed for infrequent migraines  Colon cancer screening - Cologuard test kit at home has not been used       Physical Exam Vitals reviewed.  Constitutional:      General: She is not in acute distress.    Appearance: Normal appearance. She is not ill-appearing.  HENT:     Head: Normocephalic and atraumatic.  Cardiovascular:     Rate and Rhythm: Normal rate and regular rhythm.     Pulses: Normal pulses.     Heart sounds: Normal heart sounds. No murmur heard.    No friction rub. No gallop.  Pulmonary:     Effort: Pulmonary effort is normal. No respiratory distress.     Breath sounds: Normal breath sounds. No wheezing.  Skin:    General: Skin is warm and dry.  Neurological:     Mental Status: She is alert and oriented to person, place, and time.  Psychiatric:        Mood and Affect: Mood normal.        Behavior: Behavior normal.        Thought Content: Thought content normal.         Judgment: Judgment normal.     Assessment and Plan    Hypertension Hypertension poorly controlled due to medication lapse. Home readings elevated above target. - Prescribe 320 mg antihypertensive medication. - Monitor blood pressure at home.  Hypothyroidism Running low on thyroid  medication. Requires TSH assessment. - Order TSH blood test. - Refill thyroid  medication once results available.  Fibromyalgia Worsening symptoms. Meloxicam  preferred over Advil for symptom control. - Prescribe meloxicam  for fibromyalgia. - Advise against using other over-the-counter pain medications except acetaminophen .  Depression/Anxiety Fluoxetine  20 mg effective for depression. - Ensure refills for fluoxetine  20 mg.  Insomnia Temazepam  15 mg effective for sleep. - Ensure refills for temazepam  15 mg.  Migraine Rizatriptan  effective for migraines. - Ensure refills for rizatriptan .  General Health Maintenance Due for mammogram. Needs insurance check for OBGYN. Cologuard test pending.  - Check insurance coverage for mammogram and OBGYN services. - Encourage completion of Cologuard test.      Return in about 4 months (around 05/05/2024) for chronic disease follow up.  __________________________________ Zada FREDRIK Palin, DNP, APRN, FNP-BC Primary Care and Sports Medicine [facility] [place]

## 2024-01-04 ENCOUNTER — Ambulatory Visit: Payer: Self-pay | Admitting: Medical-Surgical

## 2024-01-04 DIAGNOSIS — E039 Hypothyroidism, unspecified: Secondary | ICD-10-CM

## 2024-01-04 LAB — TSH: TSH: 0.83 u[IU]/mL (ref 0.450–4.500)

## 2024-01-04 MED ORDER — LEVOTHYROXINE SODIUM 112 MCG PO TABS: 112.0000 ug | ORAL_TABLET | Freq: Every day | ORAL | 3 refills | Status: AC

## 2024-01-28 ENCOUNTER — Other Ambulatory Visit: Payer: Self-pay | Admitting: Medical-Surgical

## 2024-03-26 ENCOUNTER — Telehealth: Payer: Self-pay

## 2024-03-26 NOTE — Telephone Encounter (Signed)
 Left message for colon cancer screening. No colonoscopy or Cologuard on file.

## 2024-05-01 ENCOUNTER — Encounter: Payer: Self-pay | Admitting: Medical-Surgical

## 2024-05-01 ENCOUNTER — Ambulatory Visit: Admitting: Medical-Surgical

## 2024-05-01 VITALS — BP 104/66 | HR 89 | Resp 20 | Ht 66.5 in | Wt 208.0 lb

## 2024-05-01 DIAGNOSIS — F3341 Major depressive disorder, recurrent, in partial remission: Secondary | ICD-10-CM

## 2024-05-01 DIAGNOSIS — M797 Fibromyalgia: Secondary | ICD-10-CM

## 2024-05-01 DIAGNOSIS — R5383 Other fatigue: Secondary | ICD-10-CM

## 2024-05-01 DIAGNOSIS — E039 Hypothyroidism, unspecified: Secondary | ICD-10-CM | POA: Diagnosis not present

## 2024-05-01 DIAGNOSIS — F5101 Primary insomnia: Secondary | ICD-10-CM

## 2024-05-01 DIAGNOSIS — I10 Essential (primary) hypertension: Secondary | ICD-10-CM

## 2024-05-01 DIAGNOSIS — Z1231 Encounter for screening mammogram for malignant neoplasm of breast: Secondary | ICD-10-CM

## 2024-05-01 DIAGNOSIS — R0981 Nasal congestion: Secondary | ICD-10-CM

## 2024-05-01 MED ORDER — HYDROCHLOROTHIAZIDE 12.5 MG PO TABS
12.5000 mg | ORAL_TABLET | Freq: Every day | ORAL | 3 refills | Status: AC
Start: 2024-05-01 — End: ?

## 2024-05-01 MED ORDER — AZELASTINE-FLUTICASONE 137-50 MCG/ACT NA SUSP: NASAL | 11 refills | Status: AC

## 2024-05-01 MED ORDER — TIZANIDINE HCL 4 MG PO TABS: 4.0000 mg | ORAL_TABLET | Freq: Every day | ORAL | 3 refills | Status: AC

## 2024-05-01 NOTE — Progress Notes (Signed)
 Established patient visit   History of Present Illness   Discussed the use of AI scribe software for clinical note transcription with the patient, who gave verbal consent to proceed.  History of Present Illness   [name] [name] is a 53 year old female with hypertension who presents for a follow-up visit.  Hypertension and blood pressure variability - Inconsistent blood pressure readings despite hydrochlorothiazide  and valsartan  therapy - Without hydrochlorothiazide , diastolic pressure in high nineties or above one hundred - With one tablet (12.5mg ) of hydrochlorothiazide , diastolic pressure in mid-eighties - With two tablets (25mg ), diastolic pressure drops to seventies - Today, after one tablet, diastolic pressure in sixties - Home blood pressure monitoring performed regularly - Correlation between weight gain and elevated blood pressure - Occasional use of decongestants despite concerns about blood pressure  Fatigue and energy deficit - Persistent exhaustion and lack of energy - Suspects thyroid  dysfunction or low iron/B12 as contributing factors - On levothyroxine  112 mcg daily for thyroid  management  Sleep disturbance and fibromyalgia symptoms - Difficulty sleeping attributed to worsening fibromyalgia symptoms - Temazepam  15 mg taken for sleep, with reduced effectiveness - Gabapentin  taken twice daily,  300mg  in the morning and 600mg  at bedtime, for fibromyalgia pain - Flexeril  used for muscle relaxation, considering change due to tolerance  Migraine management - History of migraines managed with rizatriptan  - No recent migraine episodes  Mood and psychiatric symptoms - Fluoxetine  20mg  daily taken for mood stabilization - No significant changes in mood  Upper respiratory and vestibular symptoms - Nasal congestion and fluid accumulation in ears - Vertigo associated with ear fluid - Azelastine  and Flonase used sometime for symptom management - Monitors fluid  intake and drinks water  throughout the day  Other chronic medication use - Daily use of Singulair  and meloxicam     Physical Exam   Physical Exam Vitals reviewed.  Constitutional:      General: She is not in acute distress.    Appearance: Normal appearance. She is not ill-appearing.  HENT:     Head: Normocephalic and atraumatic.  Cardiovascular:     Rate and Rhythm: Normal rate and regular rhythm.     Pulses: Normal pulses.     Heart sounds: Normal heart sounds. No murmur heard.    No friction rub. No gallop.  Pulmonary:     Effort: Pulmonary effort is normal. No respiratory distress.     Breath sounds: Normal breath sounds. No wheezing.  Skin:    General: Skin is warm and dry.  Neurological:     Mental Status: She is alert and oriented to person, place, and time.  Psychiatric:        Mood and Affect: Mood normal.        Behavior: Behavior normal.        Thought Content: Thought content normal.        Judgment: Judgment normal.    Assessment & Plan   Essential hypertension Blood pressure variable with current regimen. Discussed importance of monitoring and water  retention impact. - Prescribed hydrochlorothiazide  12.5 to 25 mg daily, adjust based on readings. - Advised blood pressure cuff calibration at clinic. - Continue valsartan  as prescribed.  Fibromyalgia Symptoms include low energy, muscle tension. Discussed lifestyle modifications for management. - Switched to tizanidine  for muscle relaxation and pain relief. - Continue gabapentin  as prescribed. - Encouraged lifestyle modifications including diet and exercise.  Hypothyroidism Suspected inadequate thyroid  levels. Discussed monitoring importance. - Ordered thyroid  function tests.  Recurrent major depressive disorder, in partial remission Stable on fluoxetine  20 mg daily with no changes in mood or treatment effectiveness. - Continue fluoxetine  as prescribed.  Primary insomnia Difficulty sleeping, exacerbated  by fibromyalgia. Current treatment includes temazepam . - Continue temazepam  15 mg as prescribed. - Switched to tizanidine  to improve sleep quality.  Fatigue Low energy potentially related to fibromyalgia, hypothyroidism, or deficiencies. Discussed addressing underlying causes. - Ordered labs for iron and B12 levels. - Encouraged lifestyle modifications including diet and exercise.  Nasal congestion Experiences congestion and vertigo. Discussed Dymista  for symptom management. - Prescribed Dymista  for congestion and eustachian tube dysfunction. - Advised to avoid decongestants affecting blood pressure.   Encounter for breast cancer screening - Mammogram ordered.  Follow up   Return in about 4 months (around 08/29/2024) for chronic disease follow up. __________________________________ Zada FREDRIK Palin, DNP, APRN, FNP-BC Primary Care and Sports Medicine [facility] [place]

## 2024-05-02 ENCOUNTER — Ambulatory Visit: Payer: Self-pay | Admitting: Medical-Surgical

## 2024-05-02 LAB — CBC WITH DIFFERENTIAL/PLATELET
Basophils Absolute: 0 x10E3/uL (ref 0.0–0.2)
Basos: 1 %
EOS (ABSOLUTE): 0.1 x10E3/uL (ref 0.0–0.4)
Eos: 1 %
Hematocrit: 39.6 % (ref 34.0–46.6)
Hemoglobin: 13.2 g/dL (ref 11.1–15.9)
Immature Grans (Abs): 0.1 x10E3/uL (ref 0.0–0.1)
Immature Granulocytes: 1 %
Lymphocytes Absolute: 1.8 x10E3/uL (ref 0.7–3.1)
Lymphs: 27 %
MCH: 30.8 pg (ref 26.6–33.0)
MCHC: 33.3 g/dL (ref 31.5–35.7)
MCV: 92 fL (ref 79–97)
Monocytes Absolute: 0.5 x10E3/uL (ref 0.1–0.9)
Monocytes: 8 %
Neutrophils Absolute: 4.1 x10E3/uL (ref 1.4–7.0)
Neutrophils: 62 %
Platelets: 238 x10E3/uL (ref 150–450)
RBC: 4.29 x10E6/uL (ref 3.77–5.28)
RDW: 12.8 % (ref 11.7–15.4)
WBC: 6.5 x10E3/uL (ref 3.4–10.8)

## 2024-05-02 LAB — IRON,TIBC AND FERRITIN PANEL
Ferritin: 246 ng/mL — ABNORMAL HIGH (ref 15–150)
Iron Saturation: 20 % (ref 15–55)
Iron: 57 ug/dL (ref 27–159)
Total Iron Binding Capacity: 284 ug/dL (ref 250–450)
UIBC: 227 ug/dL (ref 131–425)

## 2024-05-02 LAB — TSH: TSH: 0.783 u[IU]/mL (ref 0.450–4.500)

## 2024-05-02 LAB — VITAMIN D 25 HYDROXY (VIT D DEFICIENCY, FRACTURES): Vit D, 25-Hydroxy: 30.5 ng/mL (ref 30.0–100.0)

## 2024-05-02 LAB — VITAMIN B12: Vitamin B-12: >2000 pg/mL — ABNORMAL HIGH (ref 232–1245)

## 2024-06-16 ENCOUNTER — Telehealth: Payer: Self-pay | Admitting: Medical-Surgical

## 2024-06-16 DIAGNOSIS — Z1211 Encounter for screening for malignant neoplasm of colon: Secondary | ICD-10-CM

## 2024-06-16 NOTE — Telephone Encounter (Signed)
 Copied from CRM [id number]. Topic: General - Other >> Jun 16, 2024  1:26 PM Rosaria A wrote: Reason for CRM: Vernell with [employer] stated that the colugard sample order had expired. The patient just sent a viable sample and wants to know if the PCP could send in a new order. Reference Number: [id number] Phone number: [phone]

## 2024-06-16 NOTE — Telephone Encounter (Signed)
 Order placed

## 2024-06-16 NOTE — Telephone Encounter (Signed)
 [employer] said that the order for this patient has expired but the patient has already sent in a sample. Can we reorder this, please?

## 2024-06-20 LAB — COLOGUARD: COLOGUARD: POSITIVE — AB

## 2024-06-21 ENCOUNTER — Ambulatory Visit: Payer: Self-pay | Admitting: Medical-Surgical

## 2024-06-25 ENCOUNTER — Telehealth: Payer: Self-pay

## 2024-08-29 ENCOUNTER — Ambulatory Visit: Admitting: Medical-Surgical
